# Patient Record
Sex: Female | Born: 1950 | Race: White | Hispanic: No | Smoking: Former smoker
Health system: Southern US, Community
[De-identification: ages and names within clinical notes are randomized; demographics above are authoritative.]

## PROBLEM LIST (undated history)

## (undated) DIAGNOSIS — C50919 Malignant neoplasm of unspecified site of unspecified female breast: Secondary | ICD-10-CM

## (undated) DIAGNOSIS — Z8709 Personal history of other diseases of the respiratory system: Secondary | ICD-10-CM

## (undated) DIAGNOSIS — H409 Unspecified glaucoma: Secondary | ICD-10-CM

## (undated) DIAGNOSIS — M810 Age-related osteoporosis without current pathological fracture: Secondary | ICD-10-CM

## (undated) DIAGNOSIS — M62838 Other muscle spasm: Secondary | ICD-10-CM

## (undated) DIAGNOSIS — F329 Major depressive disorder, single episode, unspecified: Secondary | ICD-10-CM

## (undated) DIAGNOSIS — E559 Vitamin D deficiency, unspecified: Secondary | ICD-10-CM

## (undated) DIAGNOSIS — Z8669 Personal history of other diseases of the nervous system and sense organs: Secondary | ICD-10-CM

## (undated) DIAGNOSIS — E232 Diabetes insipidus: Secondary | ICD-10-CM

## (undated) DIAGNOSIS — K649 Unspecified hemorrhoids: Secondary | ICD-10-CM

## (undated) DIAGNOSIS — Z8041 Family history of malignant neoplasm of ovary: Secondary | ICD-10-CM

## (undated) DIAGNOSIS — H269 Unspecified cataract: Secondary | ICD-10-CM

## (undated) DIAGNOSIS — I10 Essential (primary) hypertension: Secondary | ICD-10-CM

## (undated) DIAGNOSIS — Z923 Personal history of irradiation: Secondary | ICD-10-CM

## (undated) DIAGNOSIS — G4730 Sleep apnea, unspecified: Secondary | ICD-10-CM

## (undated) DIAGNOSIS — R197 Diarrhea, unspecified: Secondary | ICD-10-CM

## (undated) DIAGNOSIS — E785 Hyperlipidemia, unspecified: Secondary | ICD-10-CM

## (undated) DIAGNOSIS — F419 Anxiety disorder, unspecified: Secondary | ICD-10-CM

## (undated) DIAGNOSIS — R42 Dizziness and giddiness: Secondary | ICD-10-CM

## (undated) DIAGNOSIS — M719 Bursopathy, unspecified: Secondary | ICD-10-CM

## (undated) DIAGNOSIS — Z86010 Personal history of colonic polyps: Secondary | ICD-10-CM

## (undated) HISTORY — DX: Hyperlipidemia, unspecified: E78.5

## (undated) HISTORY — PX: BREAST BIOPSY: SHX20

## (undated) HISTORY — DX: Anxiety disorder, unspecified: F41.9

## (undated) HISTORY — PX: OTHER SURGICAL HISTORY: SHX169

## (undated) HISTORY — DX: Sleep apnea, unspecified: G47.30

## (undated) HISTORY — DX: Unspecified cataract: H26.9

## (undated) HISTORY — PX: CATARACT EXTRACTION: SUR2

## (undated) HISTORY — DX: Personal history of irradiation: Z92.3

## (undated) HISTORY — DX: Essential (primary) hypertension: I10

## (undated) HISTORY — DX: Family history of malignant neoplasm of ovary: Z80.41

## (undated) HISTORY — DX: Major depressive disorder, single episode, unspecified: F32.9

## (undated) HISTORY — DX: Unspecified glaucoma: H40.9

## (undated) HISTORY — DX: Personal history of colonic polyps: Z86.010

## (undated) HISTORY — DX: Age-related osteoporosis without current pathological fracture: M81.0

## (undated) HISTORY — PX: COLONOSCOPY: SHX174

## (undated) HISTORY — DX: Malignant neoplasm of unspecified site of unspecified female breast: C50.919

---

## 1997-04-21 ENCOUNTER — Other Ambulatory Visit: Admission: RE | Admit: 1997-04-21 | Discharge: 1997-04-21 | Payer: Self-pay | Admitting: Obstetrics & Gynecology

## 1998-02-28 ENCOUNTER — Other Ambulatory Visit: Admission: RE | Admit: 1998-02-28 | Discharge: 1998-02-28 | Payer: Self-pay | Admitting: Obstetrics & Gynecology

## 1999-03-13 ENCOUNTER — Other Ambulatory Visit: Admission: RE | Admit: 1999-03-13 | Discharge: 1999-03-13 | Payer: Self-pay | Admitting: Obstetrics & Gynecology

## 2000-05-07 ENCOUNTER — Other Ambulatory Visit: Admission: RE | Admit: 2000-05-07 | Discharge: 2000-05-07 | Payer: Self-pay | Admitting: Obstetrics & Gynecology

## 2001-05-28 ENCOUNTER — Other Ambulatory Visit: Admission: RE | Admit: 2001-05-28 | Discharge: 2001-05-28 | Payer: Self-pay | Admitting: Obstetrics & Gynecology

## 2002-06-09 ENCOUNTER — Other Ambulatory Visit: Admission: RE | Admit: 2002-06-09 | Discharge: 2002-06-09 | Payer: Self-pay | Admitting: Obstetrics & Gynecology

## 2003-06-11 ENCOUNTER — Other Ambulatory Visit: Admission: RE | Admit: 2003-06-11 | Discharge: 2003-06-11 | Payer: Self-pay | Admitting: Obstetrics & Gynecology

## 2004-01-17 ENCOUNTER — Ambulatory Visit: Payer: Self-pay | Admitting: Internal Medicine

## 2004-03-28 ENCOUNTER — Ambulatory Visit: Payer: Self-pay | Admitting: Internal Medicine

## 2004-04-06 ENCOUNTER — Ambulatory Visit: Payer: Self-pay | Admitting: Internal Medicine

## 2004-06-21 ENCOUNTER — Other Ambulatory Visit: Admission: RE | Admit: 2004-06-21 | Discharge: 2004-06-21 | Payer: Self-pay | Admitting: Obstetrics & Gynecology

## 2004-06-28 ENCOUNTER — Ambulatory Visit: Payer: Self-pay | Admitting: Internal Medicine

## 2004-12-27 ENCOUNTER — Ambulatory Visit: Payer: Self-pay | Admitting: Internal Medicine

## 2005-01-11 ENCOUNTER — Ambulatory Visit: Payer: Self-pay | Admitting: Internal Medicine

## 2005-04-23 ENCOUNTER — Ambulatory Visit: Payer: Self-pay | Admitting: Internal Medicine

## 2005-10-22 ENCOUNTER — Ambulatory Visit: Payer: Self-pay | Admitting: Internal Medicine

## 2006-03-28 ENCOUNTER — Ambulatory Visit: Payer: Self-pay | Admitting: Internal Medicine

## 2006-04-15 ENCOUNTER — Ambulatory Visit: Payer: Self-pay | Admitting: Internal Medicine

## 2006-04-15 LAB — CONVERTED CEMR LAB
ALT: 27 units/L (ref 0–40)
Albumin: 4 g/dL (ref 3.5–5.2)
Alkaline Phosphatase: 73 units/L (ref 39–117)
BUN: 6 mg/dL (ref 6–23)
Basophils Relative: 1.7 % — ABNORMAL HIGH (ref 0.0–1.0)
CO2: 35 meq/L — ABNORMAL HIGH (ref 19–32)
Calcium: 9.9 mg/dL (ref 8.4–10.5)
Creatinine, Ser: 0.5 mg/dL (ref 0.4–1.2)
GFR calc Af Amer: 165 mL/min
HDL: 37.7 mg/dL — ABNORMAL LOW (ref >39.0)
LDL Cholesterol: 117 mg/dL — ABNORMAL HIGH (ref 0–99)
Monocytes Relative: 8.6 % (ref 3.0–11.0)
Platelets: 263 10*3/uL (ref 150–400)
RDW: 12.8 % (ref 11.5–14.6)
Total CHOL/HDL Ratio: 5
Total Protein: 7.2 g/dL (ref 6.0–8.3)
Triglycerides: 175 mg/dL — ABNORMAL HIGH (ref 0–149)
VLDL: 35 mg/dL (ref 0–40)

## 2006-09-06 ENCOUNTER — Encounter: Payer: Self-pay | Admitting: Internal Medicine

## 2006-09-06 DIAGNOSIS — I10 Essential (primary) hypertension: Secondary | ICD-10-CM | POA: Insufficient documentation

## 2006-09-06 DIAGNOSIS — E785 Hyperlipidemia, unspecified: Secondary | ICD-10-CM

## 2006-09-06 HISTORY — DX: Hyperlipidemia, unspecified: E78.5

## 2006-09-06 HISTORY — DX: Essential (primary) hypertension: I10

## 2006-10-14 ENCOUNTER — Ambulatory Visit: Payer: Self-pay | Admitting: Internal Medicine

## 2006-10-14 LAB — CONVERTED CEMR LAB
CO2: 32 meq/L (ref 19–32)
Calcium: 9.6 mg/dL (ref 8.4–10.5)
Creatinine, Ser: 0.6 mg/dL (ref 0.4–1.2)
GFR calc Af Amer: 133 mL/min
Glucose, Bld: 86 mg/dL (ref 70–99)

## 2006-10-17 ENCOUNTER — Telehealth: Payer: Self-pay | Admitting: Internal Medicine

## 2007-04-14 ENCOUNTER — Ambulatory Visit: Payer: Self-pay | Admitting: Internal Medicine

## 2007-04-14 LAB — CONVERTED CEMR LAB
AST: 25 units/L (ref 0–37)
Albumin: 4.1 g/dL (ref 3.5–5.2)
Alkaline Phosphatase: 73 units/L (ref 39–117)
BUN: 5 mg/dL — ABNORMAL LOW (ref 6–23)
Bilirubin, Direct: 0.1 mg/dL (ref 0.0–0.3)
Blood in Urine, dipstick: NEGATIVE
Chloride: 99 meq/L (ref 96–112)
Eosinophils Relative: 1.8 % (ref 0.0–5.0)
Glucose, Bld: 96 mg/dL (ref 70–99)
HDL: 33.3 mg/dL — ABNORMAL LOW (ref >39.0)
Ketones, urine, test strip: NEGATIVE
Lymphocytes Relative: 25.4 % (ref 12.0–46.0)
Monocytes Relative: 11.9 % (ref 3.0–12.0)
Neutrophils Relative %: 59.5 % (ref 43.0–77.0)
Nitrite: POSITIVE
Platelets: 278 10*3/uL (ref 150–400)
Potassium: 3.9 meq/L (ref 3.5–5.1)
Protein, U semiquant: NEGATIVE
RDW: 12.4 % (ref 11.5–14.6)
Sodium: 138 meq/L (ref 135–145)
Total CHOL/HDL Ratio: 6.7
Total Protein: 6.8 g/dL (ref 6.0–8.3)
Triglycerides: 187 mg/dL — ABNORMAL HIGH (ref 0–149)
Urobilinogen, UA: 0.2
VLDL: 37 mg/dL (ref 0–40)
WBC: 4.2 10*3/uL — ABNORMAL LOW (ref 4.5–10.5)
pH: 7

## 2007-04-21 ENCOUNTER — Ambulatory Visit: Payer: Self-pay | Admitting: Internal Medicine

## 2007-05-02 ENCOUNTER — Emergency Department (HOSPITAL_COMMUNITY): Admission: EM | Admit: 2007-05-02 | Discharge: 2007-05-02 | Payer: Self-pay | Admitting: Emergency Medicine

## 2007-05-07 ENCOUNTER — Telehealth: Payer: Self-pay | Admitting: Internal Medicine

## 2007-05-12 ENCOUNTER — Ambulatory Visit: Payer: Self-pay | Admitting: Gastroenterology

## 2007-05-21 ENCOUNTER — Telehealth: Payer: Self-pay | Admitting: Gastroenterology

## 2007-05-23 ENCOUNTER — Ambulatory Visit: Payer: Self-pay

## 2007-05-26 ENCOUNTER — Encounter: Payer: Self-pay | Admitting: Gastroenterology

## 2007-05-26 ENCOUNTER — Ambulatory Visit: Payer: Self-pay | Admitting: Gastroenterology

## 2007-05-26 ENCOUNTER — Encounter: Payer: Self-pay | Admitting: Internal Medicine

## 2007-05-27 ENCOUNTER — Encounter: Payer: Self-pay | Admitting: Gastroenterology

## 2007-09-23 ENCOUNTER — Ambulatory Visit: Payer: Self-pay | Admitting: Internal Medicine

## 2007-09-23 DIAGNOSIS — F329 Major depressive disorder, single episode, unspecified: Secondary | ICD-10-CM

## 2007-09-23 DIAGNOSIS — F3289 Other specified depressive episodes: Secondary | ICD-10-CM

## 2007-09-23 HISTORY — DX: Major depressive disorder, single episode, unspecified: F32.9

## 2007-09-23 HISTORY — DX: Other specified depressive episodes: F32.89

## 2008-03-26 ENCOUNTER — Ambulatory Visit: Payer: Self-pay | Admitting: Internal Medicine

## 2008-03-26 DIAGNOSIS — J069 Acute upper respiratory infection, unspecified: Secondary | ICD-10-CM | POA: Insufficient documentation

## 2008-07-15 ENCOUNTER — Encounter: Payer: Self-pay | Admitting: Internal Medicine

## 2008-09-16 ENCOUNTER — Ambulatory Visit: Payer: Self-pay | Admitting: Internal Medicine

## 2008-09-16 LAB — CONVERTED CEMR LAB
ALT: 24 U/L
AST: 30 U/L
Albumin: 4.6 g/dL
Alkaline Phosphatase: 76 U/L
BUN: 6 mg/dL
Basophils Absolute: 0 K/uL
Basophils Relative: 1 %
Bilirubin Urine: NEGATIVE
Bilirubin, Direct: 0 mg/dL
Blood in Urine, dipstick: NEGATIVE
CO2: 32 meq/L
Calcium: 9.4 mg/dL
Chloride: 92 meq/L — ABNORMAL LOW
Cholesterol: 187 mg/dL
Creatinine, Ser: 0.6 mg/dL
Eosinophils Absolute: 0.1 K/uL
Eosinophils Relative: 1.6 %
GFR calc non Af Amer: 109.2 mL/min
Glucose, Bld: 86 mg/dL
Glucose, Urine, Semiquant: NEGATIVE
HCT: 38.1 %
HDL: 37.3 mg/dL — ABNORMAL LOW
Hemoglobin: 13.5 g/dL
LDL Cholesterol: 129 mg/dL — ABNORMAL HIGH
Lymphocytes Relative: 24.5 %
Lymphs Abs: 1.1 K/uL
MCHC: 35.5 g/dL
MCV: 90.3 fL
Monocytes Absolute: 0.5 K/uL
Monocytes Relative: 10 %
Neutro Abs: 2.8 K/uL
Neutrophils Relative %: 62.9 %
Platelets: 246 K/uL
Potassium: 3 meq/L — ABNORMAL LOW
Protein, U semiquant: NEGATIVE
RBC: 4.22 M/uL
RDW: 12.4 %
Sodium: 134 meq/L — ABNORMAL LOW
TSH: 0.76 u[IU]/mL
Total Bilirubin: 1.1 mg/dL
Total CHOL/HDL Ratio: 5
Total Protein: 7.7 g/dL
Triglycerides: 103 mg/dL
Urobilinogen, UA: 0.2
VLDL: 20.6 mg/dL
WBC Urine, dipstick: NEGATIVE
WBC: 4.5 10*3/microliter
pH: 7

## 2008-09-24 ENCOUNTER — Ambulatory Visit: Payer: Self-pay | Admitting: Internal Medicine

## 2008-09-24 DIAGNOSIS — Z860100 Personal history of colon polyps, unspecified: Secondary | ICD-10-CM

## 2008-09-24 DIAGNOSIS — M810 Age-related osteoporosis without current pathological fracture: Secondary | ICD-10-CM

## 2008-09-24 HISTORY — DX: Age-related osteoporosis without current pathological fracture: M81.0

## 2008-09-24 HISTORY — DX: Personal history of colon polyps, unspecified: Z86.0100

## 2008-09-24 HISTORY — DX: Personal history of colonic polyps: Z86.010

## 2009-03-28 ENCOUNTER — Ambulatory Visit: Payer: Self-pay | Admitting: Internal Medicine

## 2009-03-28 LAB — CONVERTED CEMR LAB
BUN: 5 mg/dL — ABNORMAL LOW (ref 6–23)
CO2: 33 meq/L — ABNORMAL HIGH (ref 19–32)
Chloride: 104 meq/L (ref 96–112)
Potassium: 4.6 meq/L (ref 3.5–5.1)

## 2009-03-29 ENCOUNTER — Telehealth: Payer: Self-pay | Admitting: Gastroenterology

## 2009-04-01 ENCOUNTER — Ambulatory Visit: Payer: Self-pay | Admitting: Gastroenterology

## 2009-04-01 LAB — CONVERTED CEMR LAB
Albumin: 4.5 g/dL (ref 3.5–5.2)
BUN: 4 mg/dL — ABNORMAL LOW (ref 6–23)
Basophils Absolute: 0 10*3/uL (ref 0.0–0.1)
CO2: 31 meq/L (ref 19–32)
GFR calc non Af Amer: 134.52 mL/min (ref >60)
Glucose, Bld: 92 mg/dL (ref 70–99)
Hemoglobin: 13.3 g/dL (ref 12.0–15.0)
IgA: 230 mg/dL (ref 68–378)
Lymphocytes Relative: 27.2 % (ref 12.0–46.0)
Monocytes Relative: 10.3 % (ref 3.0–12.0)
Platelets: 256 10*3/uL (ref 150.0–400.0)
RDW: 12.5 % (ref 11.5–14.6)
Sodium: 134 meq/L — ABNORMAL LOW (ref 135–145)
TSH: 0.78 microintl units/mL (ref 0.35–5.50)
Tissue Transglutaminase Ab, IgA: 0.5 units (ref <7)
Total Bilirubin: 0.6 mg/dL (ref 0.3–1.2)
Total Protein: 7.3 g/dL (ref 6.0–8.3)
WBC: 3.6 10*3/uL — ABNORMAL LOW (ref 4.5–10.5)

## 2009-04-05 ENCOUNTER — Encounter: Payer: Self-pay | Admitting: Gastroenterology

## 2009-04-08 LAB — CONVERTED CEMR LAB
Collection Interval-CRCL: 24 hr
Creatinine, Urine: 40.6 mg/dL

## 2009-05-02 ENCOUNTER — Telehealth: Payer: Self-pay | Admitting: Gastroenterology

## 2009-05-10 ENCOUNTER — Ambulatory Visit: Payer: Self-pay | Admitting: Gastroenterology

## 2009-09-26 ENCOUNTER — Ambulatory Visit: Payer: Self-pay | Admitting: Internal Medicine

## 2010-02-07 NOTE — Assessment & Plan Note (Signed)
History of Present Illness Visit Type: Initial Visit Primary GI MD: Rob Bunting MD Primary Provider: Eleonore Chiquito, MD Chief Complaint: diarrhea History of Present Illness:     very pleasant 60 year old female whom I last saw the time of routine, screening colonoscopy about 2 years ago. I removed a single small tubular adenoma and she was put in our colonoscopy recall system for repeat colonoscopy at five-year interval.  she has had recurring, intermittent loose stools.  She will have extreme diarrhea (can't leave the house), this used to happen every 2-3 months.  Felt it may be food related.  Occuring more often.  Three weeks in a row.  This will be 24-36 hours of diarrhea, her stomach churned the day prior.  has been going on more many years.  No flushing, but she does feel a sort of cold sweat diarrhea.  She avoids dairy because it makes her nauseas.  Rarely eats some ice cream, cheese, does not correlate with symptloms.  Surgarless gums, she avoids, can cause diarrhea.    between diarrheal episodes she has 3-4 soft, easy to move bowel movements a week. She has never felt constipated.  Overall weight is down about 10 pounds, intentionally.  No rashes, lumps on skin, shins.               Current Medications (verified): 1)  Chlorpropamide 250 Mg Tabs (Chlorpropamide) .Marland Kitchen.. 1 Once Daily 2)  Lexapro 10 Mg Tabs (Escitalopram Oxalate) .Marland Kitchen.. 1 Once Daily 3)  Lipitor 80 Mg Tabs (Atorvastatin Calcium) .Marland Kitchen.. 1 Once Daily 4)  Lisinopril-Hydrochlorothiazide 20-25 Mg Tabs (Lisinopril-Hydrochlorothiazide) .Marland Kitchen.. 1 Once Daily 5)  Potassium Chloride Crys Cr 20 Meq Tbcr (Potassium Chloride Crys Cr) .... 2 Two Times A Day 6)  Xalatan 0.005 %  Soln (Latanoprost) .... Uad 7)  Actonel 150 Mg Tabs (Risedronate Sodium) .... One Monthly 8)  Alendronate Sodium 70 Mg Tabs (Alendronate Sodium) .... One Weekly 9)  Vitamin D (Ergocalciferol) 50000 Unit Caps (Ergocalciferol) .Marland Kitchen.. 1 Q Weekly 10)   Diphenoxylate-Atropine 2.5-0.025 Mg Tabs (Diphenoxylate-Atropine) .... One Every 4 Hours As Needed For Diarrhea  Allergies (verified): No Known Drug Allergies  Past History:  Past Medical History: Hyperlipidemia Hypertension Diabetes insipidus Depression Osteoporosis precancerous colon polyps, colonoscopy 2007-06-19, set for repeat colonoscopy June 18, 2012 Glaucoma ([place]) intermittent diarrhea  Past Surgical History: L breast lumpectomy gravida one, para one, abortus zero Cardiolyte 06-19-07 bone density June 18, 2008  Family History: father died age 61 of an MI, history of hypertension mother died age 15 type 2 diabetes, coronary artery disease, status post prior MI at age 47 One brother died of ALS one brother, status post CABG age 81 Two sisters, one status post MI, that is 90  Social History: recently widowed fall of 06-19-06 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 social drinker, does not smoke cigarettes.  Review of Systems       Pertinent positive and negative review of systems were noted in the above HPI and GI specific review of systems.  All other review of systems was otherwise negative.   Vital Signs:  Patient profile:   60 year old female Height:      61.5 inches Weight:      208.6 pounds BMI:     38.92 Pulse rate:   80 / minute Pulse rhythm:   regular BP sitting:   130 / 76  (left arm) Cuff size:   regular  Vitals Entered By: Harlow Mares CMA Duncan Dull) (April 01, 2009 9:29 AM)  Physical  Exam  Additional Exam:  Constitutional: generally well appearing Psychiatric: alert and oriented times 3 Eyes: extraocular movements intact Mouth: oropharynx moist, no lesions Neck: supple, no lymphadenopathy Cardiovascular: heart regular rate and rythm Lungs: CTA bilaterally Abdomen: soft, non-tender, non-distended, no obvious ascites, no peritoneal signs, normal bowel sounds Extremities: no lower extremity edema bilaterally Skin: no lesions on visible extremities    Impression &  Recommendations:  Problem # 1:  Intermittent diarrhea between these episodes of intermittent diarrhea she really feels fine. She has not constipated. She had a colonoscopy 2 years ago and it was essentially normal except for a small tubular adenoma.  This may be IBS, diarrhea predominance. Perhaps she has microscopic colitis which would not have been checked for on routine screening colonoscopy. Perhaps chronic infection although it seems unlikely given that the diarrheal episodes are discrete and between that she has relatively normal bowels. Perhaps dietary related although she really tries to avoid sugar-free gum, dairy. Carcinoid syndrome can present with intermittent diarrheal episodes, she does not have flushing however. For now we'll send her blood for CBC, complete metabolic profile, sedimentation rate.  At her next diarrheal episode we will send her stool for the studies listed below. We will also collect a urine samples for 5 HIAA levels.  At the following workup is completed I will contact her, consider further workup if needed.  Other Orders: TLB-CMP (Comprehensive Metabolic Pnl) (80053-COMP) TLB-TSH (Thyroid Stimulating Hormone) (84443-TSH) TLB-CBC Platelet - w/Differential (85025-CBCD) TLB-IgA (Immunoglobulin A) (82784-IGA) T-Tissue Transglutamase Ab IgA ([id number]) TLB-Creatinine, Blood (82565-CREA) T-Urine 24 Hr. Creatinine Clearance [phone]) T-Urine 24 Hr. 5 HIAA [phone]) T-Culture, C-Diff Toxin A/B ([id number]) T-Culture, Stool ([id number]) T-Fecal WBC ([id number])  Patient Instructions: 1)  You will get lab test(s) done today (CMET, TSH, CBC, 24 hour urinary 5HIAA levels, 24 hour urinary creatinine, tTG, total IgA level). 2)  Stool samples during your next episode of diarrhrea (c. diff, giardia, stool cultures, fecal lactoferrin). 3)  A copy of this information will be sent to Dr. Amador Cunas. 4)  Can take immodium 1 pill, at first sign of trouble. 5)   The medication list was reviewed and reconciled.  All changed / newly prescribed medications were explained.  A complete medication list was provided to the patient / caregiver.

## 2010-02-07 NOTE — Progress Notes (Signed)
Summary: ? re stools sample   Phone Note Call from Patient Call back at [phone]   Caller: Patient Call For: Christella Hartigan Reason for Call: Talk to Nurse Summary of Call: Patient has questions regarding stool samples before coming to her appt 5-3 Initial call taken by: Tawni Levy,  May 02, 2009 10:59 AM  Follow-up for Phone Call        pt has not had any diarrhea episodes to complete stool studies but she will keep her upcoming 05/10/09 appt Follow-up by: Chales Abrahams CMA (AAMA),  May 02, 2009 11:07 AM

## 2010-02-07 NOTE — Assessment & Plan Note (Signed)
  Review of gastrointestinal problems: 1. Tubular adenoma: Colonoscopy may 2009. Recall colonoscopy at five-year interval 2. intermittent, recurrent diarrhea ( workup started spring, 2011): celiac sprue tests negative, TSH normal, Urinary 5HIAA level normal.     History of Present Illness Visit Type: Follow-up Visit Primary GI MD: Rob Bunting MD Primary Provider: Eleonore Chiquito, MD Chief Complaint: 4-5 week f/u History of Present Illness:     who has not had dramatic diarrhea since last visit.  Still can have intermittent 3-4 loose stools every once in a while.  No rectal bleeding.  lab testing was all essentially negative, see those results above.           Current Medications (verified): 1)  Chlorpropamide 250 Mg Tabs (Chlorpropamide) .Marland Kitchen.. 1 Once Daily 2)  Lexapro 10 Mg Tabs (Escitalopram Oxalate) .Marland Kitchen.. 1 Once Daily 3)  Lipitor 80 Mg Tabs (Atorvastatin Calcium) .Marland Kitchen.. 1 Once Daily 4)  Lisinopril-Hydrochlorothiazide 20-25 Mg Tabs (Lisinopril-Hydrochlorothiazide) .Marland Kitchen.. 1 Once Daily 5)  Potassium Chloride Crys Cr 20 Meq Tbcr (Potassium Chloride Crys Cr) .... 2 Two Times A Day 6)  Xalatan 0.005 %  Soln (Latanoprost) .... Uad 7)  Actonel 150 Mg Tabs (Risedronate Sodium) .... One Monthly 8)  Vitamin D (Ergocalciferol) 50000 Unit Caps (Ergocalciferol) .Marland Kitchen.. 1 Q Weekly  Allergies (verified): No Known Drug Allergies  Vital Signs:  Patient profile:   60 year old female Height:      61.5 inches Weight:      213 pounds BMI:     39.74 Pulse rate:   78 / minute Pulse rhythm:   regular BP sitting:   130 / 68  (left arm)  Vitals Entered By: Chales Abrahams CMA Duncan Dull) (May 10, 2009 2:19 PM)  Physical Exam  Additional Exam:  Constitutional: generally well appearing Psychiatric: alert and oriented times 3 Abdomen: soft, non-tender, non-distended, normal bowel sounds    Impression & Recommendations:  Problem # 1:  intermittent diarrhea she a colonoscopy less than 2 years ago. Her  symptoms are intermittent only. Lab testing for sprue, carcinoid syndrome were all negative. I recommended she take a single Imodium every morning shortly after waking up and to call if things worsen. She still has to bring a sample of stool she has dramatic watery diarrhea again.  Patient Instructions: 1)  Take one immodium every morning after waking up.  Only stop if you become constipated. 2)  Call Dr. Christella Hartigan office in 2 months to update on symptoms. 3)  The medication list was reviewed and reconciled.  All changed / newly prescribed medications were explained.  A complete medication list was provided to the patient / caregiver.

## 2010-02-07 NOTE — Assessment & Plan Note (Signed)
Summary: 6 MONTH FUP//CCM   Vital Signs:  Patient profile:   60 year old female Weight:      215 pounds Temp:     98.2 degrees F oral BP sitting:   122 / 78  (right arm) Cuff size:   regular  Vitals Entered By: Duard Brady LPN (September 26, 2009 8:16 AM) CC: 6 MOS ROV  doing well Is Patient Diabetic? No Flu Vaccine Consent Questions     Do you have a history of severe allergic reactions to this vaccine? no    Any prior history of allergic reactions to egg and/or gelatin? no    Do you have a sensitivity to the preservative Thimersol? no    Do you have a past history of Guillan-Barre Syndrome? no    Do you currently have an acute febrile illness? no    Have you ever had a severe reaction to latex? no    Vaccine information given and explained to patient? yes    Are you currently pregnant? no    Lot Number:[id number]   Exp Date:07/08/2010   Site Given  Left Deltoid IM    Primary Care Provider:  Eleonore Chiquito, MD  CC:  6 MOS ROV  doing well.  History of Present Illness: 60 year old patient who is seen today for follow-up.  she has treated hypertension, and dyslipidemia.  Laboratory studies were checked last brain.  She has done quite well.  She has had a recent gynecologic evaluation.  She also has a history of diabetes insipidus, which has been stable.  No concerns or complaints today as a 5-pound weight gain.  She has a history also of depression, which has been quite stable on Lexapro  Allergies (verified): No Known Drug Allergies  Past History:  Past Medical History: Reviewed history from 04/01/2009 and no changes required. Hyperlipidemia Hypertension Diabetes insipidus Depression Osteoporosis precancerous colon polyps, colonoscopy 2009, set for repeat colonoscopy 2014 Glaucoma ([place]) intermittent diarrhea  Past Surgical History: Reviewed history from 04/01/2009 and no changes required. L breast lumpectomy gravida one, para one, abortus  zero Cardiolyte 2009 bone density 2010  Review of Systems       The patient complains of weight gain.  The patient denies anorexia, fever, weight loss, vision loss, decreased hearing, hoarseness, chest pain, syncope, dyspnea on exertion, peripheral edema, prolonged cough, headaches, hemoptysis, abdominal pain, melena, hematochezia, severe indigestion/heartburn, hematuria, incontinence, genital sores, muscle weakness, suspicious skin lesions, transient blindness, difficulty walking, depression, unusual weight change, abnormal bleeding, enlarged lymph nodes, angioedema, and breast masses.    Physical Exam  General:  overweight-appearing.  120/82overweight-appearing.   Head:  Normocephalic and atraumatic without obvious abnormalities. No apparent alopecia or balding. Eyes:  No corneal or conjunctival inflammation noted. EOMI. Perrla. Funduscopic exam benign, without hemorrhages, exudates or papilledema. Vision grossly normal. Mouth:  Oral mucosa and oropharynx without lesions or exudates.  Teeth in good repair. Neck:  No deformities, masses, or tenderness noted. Lungs:  Normal respiratory effort, chest expands symmetrically. Lungs are clear to auscultation, no crackles or wheezes. Heart:  Normal rate and regular rhythm. S1 and S2 normal without gallop, murmur, click, rub or other extra sounds. Abdomen:  Bowel sounds positive,abdomen soft and non-tender without masses, organomegaly or hernias noted. Msk:  No deformity or scoliosis noted of thoracic or lumbar spine.   Pulses:  R and L carotid,radial,femoral,dorsalis pedis and posterior tibial pulses are full and equal bilaterally Extremities:  No clubbing, cyanosis, edema, or deformity noted with normal full range of  motion of all joints.   Skin:  Intact without suspicious lesions or rashes Cervical Nodes:  No lymphadenopathy noted Psych:  Cognition and judgment appear intact. Alert and cooperative with normal attention span and concentration. No  apparent delusions, illusions, hallucinations   Impression & Recommendations:  Problem # 1:  DEPRESSION (ICD-311)  Her updated medication list for this problem includes:    Lexapro 10 Mg Tabs (Escitalopram oxalate) .Marland Kitchen... 1 once daily  Her updated medication list for this problem includes:    Lexapro 10 Mg Tabs (Escitalopram oxalate) .Marland Kitchen... 1 once daily  Problem # 2:  HYPERTENSION (ICD-401.9)  Her updated medication list for this problem includes:    Lisinopril-hydrochlorothiazide 20-25 Mg Tabs (Lisinopril-hydrochlorothiazide) .Marland Kitchen... 1 once daily  Her updated medication list for this problem includes:    Lisinopril-hydrochlorothiazide 20-25 Mg Tabs (Lisinopril-hydrochlorothiazide) .Marland Kitchen... 1 once daily  Problem # 3:  HYPERLIPIDEMIA (ICD-272.4)  Her updated medication list for this problem includes:    Lipitor 80 Mg Tabs (Atorvastatin calcium) .Marland Kitchen... 1 once daily  Her updated medication list for this problem includes:    Lipitor 80 Mg Tabs (Atorvastatin calcium) .Marland Kitchen... 1 once daily  Complete Medication List: 1)  Chlorpropamide 250 Mg Tabs (Chlorpropamide) .Marland Kitchen.. 1 once daily 2)  Lexapro 10 Mg Tabs (Escitalopram oxalate) .Marland Kitchen.. 1 once daily 3)  Lipitor 80 Mg Tabs (Atorvastatin calcium) .Marland Kitchen.. 1 once daily 4)  Lisinopril-hydrochlorothiazide 20-25 Mg Tabs (Lisinopril-hydrochlorothiazide) .Marland Kitchen.. 1 once daily 5)  Potassium Chloride Crys Cr 20 Meq Tbcr (Potassium chloride crys cr) .... 2 two times a day 6)  Actonel 150 Mg Tabs (Risedronate sodium) .... One monthly 7)  Vitamin D (ergocalciferol) 50000 Unit Caps (Ergocalciferol) .Marland Kitchen.. 1 q weekly 8)  Lumigan 0.01 % Soln (Bimatoprost) .... Uad  Other Orders: Admin 1st Vaccine (45409) Flu Vaccine 66yrs + (81191)  Patient Instructions: 1)  Please schedule a follow-up appointment in 6 months. 2)  Limit your Sodium (Salt) to less than 2 grams a day(slightly less than 1/2 a teaspoon) to prevent fluid retention, swelling, or worsening of symptoms. 3)  It  is important that you exercise regularly at least 20 minutes 5 times a week. If you develop chest pain, have severe difficulty breathing, or feel very tired , stop exercising immediately and seek medical attention. 4)  You need to lose weight. Consider a lower calorie diet and regular exercise.  5)  Take calcium +Vitamin D daily. Prescriptions: ACTONEL 150 MG TABS (RISEDRONATE SODIUM) one monthly  #3 x 6   Entered and Authorized by:   Gordy Savers  MD   Signed by:   Gordy Savers  MD on 09/26/2009   Method used:   Print then Give to Patient   RxID:   [id number] POTASSIUM CHLORIDE CRYS CR 20 MEQ TBCR (POTASSIUM CHLORIDE CRYS CR) 2 two times a day  #360 x 6   Entered and Authorized by:   Gordy Savers  MD   Signed by:   Gordy Savers  MD on 09/26/2009   Method used:   Print then Give to Patient   RxID:   [id number] LISINOPRIL-HYDROCHLOROTHIAZIDE 20-25 MG TABS (LISINOPRIL-HYDROCHLOROTHIAZIDE) 1 once daily  #90 x 6   Entered and Authorized by:   Gordy Savers  MD   Signed by:   Gordy Savers  MD on 09/26/2009   Method used:   Print then Give to Patient   RxID:   [id number] LIPITOR 80 MG TABS (ATORVASTATIN CALCIUM) 1 once daily  #90 x  6   Entered and Authorized by:   Gordy Savers  MD   Signed by:   Gordy Savers  MD on 09/26/2009   Method used:   Print then Give to Patient   RxID:   [id number] LEXAPRO 10 MG TABS (ESCITALOPRAM OXALATE) 1 once daily  #90 Tablet x 6   Entered and Authorized by:   Gordy Savers  MD   Signed by:   Gordy Savers  MD on 09/26/2009   Method used:   Print then Give to Patient   RxID:   [id number] CHLORPROPAMIDE 250 MG TABS (CHLORPROPAMIDE) 1 once daily  #90 Tablet x 6   Entered and Authorized by:   Gordy Savers  MD   Signed by:   Gordy Savers  MD on 09/26/2009   Method used:   Print then Give to Patient   RxID:   [id number]

## 2010-02-07 NOTE — Progress Notes (Signed)
Summary: Diarrhea   Phone Note From Other Clinic Call back at Home Phone [phone]   Caller: Aurther Loft @ Dr Lesia Hausen Call For: Dr Christella Hartigan Reason for Call: Schedule Patient Appt Summary of Call: Diarrhea loose watery stools- can have 20 episodes in one day. Would like to seen sooner than next available on 05-03-09. Had a procedure with Dr Christella Hartigan but no office visit. Call back to patient directly. Initial call taken by: Leanor Kail [facility],  March 29, 2009 10:17 AM  Follow-up for Phone Call        Should i work this pt. in with Pa. as your first new pt. spot is mid April?You are supervising dr. on 04/05/2009. Follow-up by: Teryl Lucy RN,  March 29, 2009 11:03 AM  Additional Follow-up for Phone Call Additional follow up Details #1::        i have an opening this friday (not sure if it is rov or NGI spot, doesn't matter to me, she can have the spot).  If she feels this isn't soon enough then PA visit in next day or two.  Additional Follow-up by: Rachael Fee MD,  March 29, 2009 11:21 AM    Additional Follow-up for Phone Call Additional follow up Details #2::    given NP3 appt. for this Friday ar 9:30 am.message left for pt. to call me back to confirm she got message. Follow-up by: Teryl Lucy RN,  March 29, 2009 12:01 PM  Additional Follow-up for Phone Call Additional follow up Details #3:: Details for Additional Follow-up Action Taken: Pt. will come for appt. this Friday. Additional Follow-up by: Teryl Lucy RN,  March 29, 2009 1:55 PM

## 2010-02-07 NOTE — Assessment & Plan Note (Signed)
Summary: 6 mo rov/mm/pt rescd//ccm   Vital Signs:  Patient profile:   60 year old female Weight:      210 pounds Temp:     97.0 degrees F oral BP sitting:   140 / 80  (right arm) Cuff size:   regular  Vitals Entered By: Duard Brady LPN (March 28, 2009 8:37 AM) CC: 6 mos rov - doing ok , c/o gi issues - diarrhea wkly , dry skin and nose bleeds Is Patient Diabetic? No   CC:  6 mos rov - doing ok , c/o gi issues - diarrhea wkly , and dry skin and nose bleeds.  History of Present Illness: 60 year old patient who is seen today for follow-up.  She has a history of hypertension, dyslipidemia, and diabetes insipidus.  Her main complaint is diarrhea.  She has had intermittent problems for about 5 years, but usually has diarrhea one or two days per month.  For the past 3 weeks.  She has had two severe days per week of diarrhea.  She describes this as loose, watery, occurring as many as 20 times per day.  There's been no abdominal pain or weight loss. Does not seem  to be related to lactose products; she ingests ice cream twice per week.  Attempts at a high fiber diet.  Has resulted in increased gaseousness and abdominal discomfort.  She has a history of depression, which has been stable  Preventive Screening-Counseling & Management  Alcohol-Tobacco     Smoking Status: never  Allergies (verified): No Known Drug Allergies  Past History:  Past Medical History: Hyperlipidemia Hypertension Diabetes insipidus Depression Osteoporosis Colonic polyps, hx of Glaucoma ([place]) diarrhea  Social History: Smoking Status:  never  Review of Systems       The patient complains of weight gain.  The patient denies anorexia, fever, weight loss, vision loss, decreased hearing, hoarseness, chest pain, syncope, dyspnea on exertion, peripheral edema, prolonged cough, headaches, hemoptysis, abdominal pain, melena, hematochezia, severe indigestion/heartburn, hematuria, incontinence, genital sores,  muscle weakness, suspicious skin lesions, transient blindness, difficulty walking, depression, unusual weight change, abnormal bleeding, enlarged lymph nodes, angioedema, and breast masses.    Physical Exam  General:  overweight-appearing.  normal blood pressureoverweight-appearing.   Head:  Normocephalic and atraumatic without obvious abnormalities. No apparent alopecia or balding. Eyes:  No corneal or conjunctival inflammation noted. EOMI. Perrla. Funduscopic exam benign, without hemorrhages, exudates or papilledema. Vision grossly normal. Mouth:  Oral mucosa and oropharynx without lesions or exudates.  Teeth in good repair. Neck:  No deformities, masses, or tenderness noted. Lungs:  Normal respiratory effort, chest expands symmetrically. Lungs are clear to auscultation, no crackles or wheezes. Heart:  Normal rate and regular rhythm. S1 and S2 normal without gallop, murmur, click, rub or other extra sounds. Abdomen:  Bowel sounds positive,abdomen soft and non-tender without masses, organomegaly or hernias noted. Msk:  No deformity or scoliosis noted of thoracic or lumbar spine.   Pulses:  R and L carotid,radial,femoral,dorsalis pedis and posterior tibial pulses are full and equal bilaterally Extremities:  No clubbing, cyanosis, edema, or deformity noted with normal full range of motion of all joints.     Impression & Recommendations:  Problem # 1:  DIARRHEA (ICD-787.91) Assessment Unchanged  Her updated medication list for this problem includes:    Diphenoxylate-atropine 2.5-0.025 Mg Tabs (Diphenoxylate-atropine) ..... 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  Will set up for GI consultation    Her updated medication list for this  problem includes:    Diphenoxylate-atropine 2.5-0.025 Mg Tabs (Diphenoxylate-atropine) ..... One every 4 hours as needed for diarrhea  Problem # 2:  DEPRESSION (ICD-311)  Her updated medication list for this problem  includes:    Lexapro 10 Mg Tabs (Escitalopram oxalate) .Marland Kitchen... 1 once daily  Her updated medication list for this problem includes:    Lexapro 10 Mg Tabs (Escitalopram oxalate) .Marland Kitchen... 1 once daily  Problem # 3:  HYPERTENSION (ICD-401.9)  Her updated medication list for this problem includes:    Lisinopril-hydrochlorothiazide 20-25 Mg Tabs (Lisinopril-hydrochlorothiazide) .Marland Kitchen... 1 once daily    Her updated medication list for this problem includes:    Lisinopril-hydrochlorothiazide 20-25 Mg Tabs (Lisinopril-hydrochlorothiazide) .Marland Kitchen... 1 once daily  Problem # 4:  HYPERLIPIDEMIA (ICD-272.4)  Her updated medication list for this problem includes:    Lipitor 80 Mg Tabs (Atorvastatin calcium) .Marland Kitchen... 1 once daily  Her updated medication list for this problem includes:    Lipitor 80 Mg Tabs (Atorvastatin calcium) .Marland Kitchen... 1 once daily  Complete Medication List: 1)  Chlorpropamide 250 Mg Tabs (Chlorpropamide) .Marland Kitchen.. 1 once daily 2)  Lexapro 10 Mg Tabs (Escitalopram oxalate) .Marland Kitchen.. 1 once daily 3)  Lipitor 80 Mg Tabs (Atorvastatin calcium) .Marland Kitchen.. 1 once daily 4)  Lisinopril-hydrochlorothiazide 20-25 Mg Tabs (Lisinopril-hydrochlorothiazide) .Marland Kitchen.. 1 once daily 5)  Potassium Chloride Crys Cr 20 Meq Tbcr (Potassium chloride crys cr) .... 2 two times a day 6)  Xalatan 0.005 % Soln (Latanoprost) .... Uad 7)  Actonel 150 Mg Tabs (Risedronate sodium) .... One monthly 8)  Alendronate Sodium 70 Mg Tabs (Alendronate sodium) .... One weekly 9)  Vitamin D (ergocalciferol) 50000 Unit Caps (Ergocalciferol) .Marland Kitchen.. 1 q weekly 10)  Diphenoxylate-atropine 2.5-0.025 Mg Tabs (Diphenoxylate-atropine) .... One every 4 hours as needed for diarrhea  Other Orders: Gastroenterology Referral (GI) Venipuncture (60454) TLB-BMP (Basic Metabolic Panel-BMET) (80048-METABOL)  Patient Instructions: 1)  Please schedule a follow-up appointment in 6 months. 2)  Limit your Sodium (Salt). 3)  It is important that you exercise regularly at  least 20 minutes 5 times a week. If you develop chest pain, have severe difficulty breathing, or feel very tired , stop exercising immediately and seek medical attention. 4)  You need to lose weight. Consider a lower calorie diet and regular exercise.  5)  Check your Blood Pressure regularly. If it is above: 150/90  you should make an appointment. 6)  GI consultation as scheduled Prescriptions: DIPHENOXYLATE-ATROPINE 2.5-0.025 MG TABS (DIPHENOXYLATE-ATROPINE) one every 4 hours as needed for diarrhea  #50 x 2   Entered and Authorized by:   Gordy Savers  MD   Signed by:   Gordy Savers  MD on 03/28/2009   Method used:   Print then Give to Patient   RxID:   [id number] POTASSIUM CHLORIDE CRYS CR 20 MEQ TBCR (POTASSIUM CHLORIDE CRYS CR) 2 two times a day  #360 x 6   Entered and Authorized by:   Gordy Savers  MD   Signed by:   Gordy Savers  MD on 03/28/2009   Method used:   Print then Give to Patient   RxID:   [id number] LISINOPRIL-HYDROCHLOROTHIAZIDE 20-25 MG TABS (LISINOPRIL-HYDROCHLOROTHIAZIDE) 1 once daily  #90 x 3   Entered and Authorized by:   Gordy Savers  MD   Signed by:   Gordy Savers  MD on 03/28/2009   Method used:   Print then Give to Patient   RxID:   [id number] LIPITOR 80 MG TABS (ATORVASTATIN CALCIUM) 1  once daily  #90 x 2   Entered and Authorized by:   Gordy Savers  MD   Signed by:   Gordy Savers  MD on 03/28/2009   Method used:   Print then Give to Patient   RxID:   [id number] LEXAPRO 10 MG TABS (ESCITALOPRAM OXALATE) 1 once daily  #90 Tablet x 2   Entered and Authorized by:   Gordy Savers  MD   Signed by:   Gordy Savers  MD on 03/28/2009   Method used:   Print then Give to Patient   RxID:   [id number] CHLORPROPAMIDE 250 MG TABS (CHLORPROPAMIDE) 1 once daily  #90 Tablet x 4   Entered and Authorized by:   Gordy Savers  MD   Signed by:   Gordy Savers  MD on  03/28/2009   Method used:   Print then Give to Patient   RxID:   [id number]

## 2010-02-23 ENCOUNTER — Other Ambulatory Visit: Payer: Self-pay | Admitting: Internal Medicine

## 2010-03-24 ENCOUNTER — Encounter: Payer: Self-pay | Admitting: Internal Medicine

## 2010-03-27 ENCOUNTER — Encounter: Payer: Self-pay | Admitting: Internal Medicine

## 2010-03-27 ENCOUNTER — Telehealth: Payer: Self-pay | Admitting: Internal Medicine

## 2010-03-27 ENCOUNTER — Ambulatory Visit (INDEPENDENT_AMBULATORY_CARE_PROVIDER_SITE_OTHER): Admitting: Internal Medicine

## 2010-03-27 DIAGNOSIS — I10 Essential (primary) hypertension: Secondary | ICD-10-CM

## 2010-03-27 DIAGNOSIS — M810 Age-related osteoporosis without current pathological fracture: Secondary | ICD-10-CM

## 2010-03-27 DIAGNOSIS — E119 Type 2 diabetes mellitus without complications: Secondary | ICD-10-CM

## 2010-03-27 DIAGNOSIS — F329 Major depressive disorder, single episode, unspecified: Secondary | ICD-10-CM

## 2010-03-27 DIAGNOSIS — D649 Anemia, unspecified: Secondary | ICD-10-CM

## 2010-03-27 DIAGNOSIS — E785 Hyperlipidemia, unspecified: Secondary | ICD-10-CM

## 2010-03-27 LAB — BASIC METABOLIC PANEL
BUN: 10 mg/dL (ref 6–23)
CO2: 31 mEq/L (ref 19–32)
Chloride: 99 mEq/L (ref 96–112)
Glucose, Bld: 104 mg/dL — ABNORMAL HIGH (ref 70–99)
Potassium: 4.7 mEq/L (ref 3.5–5.1)

## 2010-03-27 LAB — LIPID PANEL
HDL: 42.9 mg/dL (ref >39.00)
Total CHOL/HDL Ratio: 6
VLDL: 23.6 mg/dL (ref 0.0–40.0)

## 2010-03-27 LAB — HEPATIC FUNCTION PANEL
Bilirubin, Direct: 0.1 mg/dL (ref 0.0–0.3)
Total Bilirubin: 0.5 mg/dL (ref 0.3–1.2)
Total Protein: 7.3 g/dL (ref 6.0–8.3)

## 2010-03-27 LAB — CBC WITH DIFFERENTIAL/PLATELET
Basophils Absolute: 0 10*3/uL (ref 0.0–0.1)
Basophils Relative: 0.9 % (ref 0.0–3.0)
Eosinophils Absolute: 0.1 10*3/uL (ref 0.0–0.7)
HCT: 37.7 % (ref 36.0–46.0)
Lymphocytes Relative: 29.9 % (ref 12.0–46.0)
MCV: 92.1 fl (ref 78.0–100.0)
Monocytes Relative: 10.8 % (ref 3.0–12.0)
Neutro Abs: 2.4 10*3/uL (ref 1.4–7.7)
Neutrophils Relative %: 55.6 % (ref 43.0–77.0)
Platelets: 258 10*3/uL (ref 150.0–400.0)
WBC: 4.3 10*3/uL — ABNORMAL LOW (ref 4.5–10.5)

## 2010-03-27 LAB — LDL CHOLESTEROL, DIRECT: Direct LDL: 174.1 mg/dL

## 2010-03-27 MED ORDER — POTASSIUM CHLORIDE CRYS ER 20 MEQ PO TBCR: 20.0000 meq | EXTENDED_RELEASE_TABLET | Freq: Two times a day (BID) | ORAL | Status: DC

## 2010-03-27 MED ORDER — ATORVASTATIN CALCIUM 80 MG PO TABS: 80.0000 mg | ORAL_TABLET | Freq: Every day | ORAL | Status: DC

## 2010-03-27 MED ORDER — RISEDRONATE SODIUM 150 MG PO TABS: 150.0000 mg | ORAL_TABLET | ORAL | Status: DC

## 2010-03-27 MED ORDER — ESCITALOPRAM OXALATE 10 MG PO TABS: 10.0000 mg | ORAL_TABLET | Freq: Every day | ORAL | Status: DC

## 2010-03-27 MED ORDER — CHLORPROPAMIDE 250 MG PO TABS: 250.0000 mg | ORAL_TABLET | Freq: Every day | ORAL | Status: DC

## 2010-03-27 MED ORDER — LISINOPRIL-HYDROCHLOROTHIAZIDE 20-25 MG PO TABS: 1.0000 | ORAL_TABLET | Freq: Every day | ORAL | Status: DC

## 2010-03-27 NOTE — Progress Notes (Signed)
  Subjective:    Patient ID: Chelsea Moreno, female    DOB: 07-09-1950, 60 y.o.   MRN: [id number]  HPI Wt Readings from Last 3 Encounters:  03/27/10 210 lb (95.255 kg)  09/26/09 215 lb (97.523 kg)  05/10/09 213 lb (96.616 kg)     Review of Systems     Objective:   Physical Exam        Assessment & Plan:

## 2010-03-27 NOTE — Patient Instructions (Signed)
Limit your sodium (Salt) intake  Take a calcium supplement, plus 800-1200 units of vitamin D    It is important that you exercise regularly, at least 20 minutes 3 to 4 times per week.  If you develop chest pain or shortness of breath seek  medical attention.  Return in 6 months for follow-up 

## 2010-03-27 NOTE — Progress Notes (Signed)
  Subjective:    Patient ID: Chelsea Moreno, female    DOB: Jan 07, 1951, 60 y.o.   MRN: [id number]  HPI   60 year old patient who is seen today for followup. She has a history of hypertension and dyslipidemia. She is followed by gynecology annually. She has osteoporosis and is on Actonel. No concerns or complaints today except for some left lateral ankle pain that has been present for months. This is fairly minor. She remains on Lipitor 80 mg daily which he tolerates well. She has a history of diabetes insipidus   Review of Systems  Constitutional: Negative.   HENT: Negative for hearing loss, congestion, sore throat, rhinorrhea, dental problem, sinus pressure and tinnitus.   Eyes: Negative for pain, discharge and visual disturbance.  Respiratory: Negative for cough and shortness of breath.   Cardiovascular: Negative for chest pain, palpitations and leg swelling.  Gastrointestinal: Negative for nausea, vomiting, abdominal pain, diarrhea, constipation, blood in stool and abdominal distention.  Genitourinary: Negative for dysuria, urgency, frequency, hematuria, flank pain, vaginal bleeding, vaginal discharge, difficulty urinating, vaginal pain and pelvic pain.  Musculoskeletal: Positive for arthralgias. Negative for joint swelling and gait problem.  Skin: Negative for rash.  Neurological: Negative for dizziness, syncope, speech difficulty, weakness, numbness and headaches.  Hematological: Negative for adenopathy.  Psychiatric/Behavioral: Negative for behavioral problems, dysphoric mood and agitation. The patient is not nervous/anxious.        Objective:   Physical Exam  Constitutional: She is oriented to person, place, and time. She appears well-developed and well-nourished. No distress.  HENT:  Head: Normocephalic.  Right Ear: External ear normal.  Left Ear: External ear normal.  Mouth/Throat: Oropharynx is clear and moist.  Eyes: Conjunctivae and EOM are normal. Pupils are equal, round,  and reactive to light.  Neck: Normal range of motion. Neck supple. No thyromegaly present.  Cardiovascular: Normal rate, regular rhythm, normal heart sounds and intact distal pulses.   Pulmonary/Chest: Effort normal and breath sounds normal.  Abdominal: Soft. Bowel sounds are normal. She exhibits no mass. There is no tenderness.  Musculoskeletal: Normal range of motion.  Lymphadenopathy:    She has no cervical adenopathy.  Neurological: She is alert and oriented to person, place, and time.  Skin: Skin is warm and dry. No rash noted.  Psychiatric: She has a normal mood and affect. Her behavior is normal.          Assessment & Plan:   hypertension well controlled  Dyslipidemia we'll check a fasting lipid profile today  Diabetes insipidus. We'll check electrolytes   Recheck in 6 months

## 2010-03-27 NOTE — Telephone Encounter (Signed)
Pharmacist called re: pt Klorcon 20 mg. Need clarification. Pls call back asap. All other meds were 90 day supply. The Klorcon works out to be a 45 day supply as written.

## 2010-03-27 NOTE — Telephone Encounter (Signed)
Should of been for #180 - corrected quanity

## 2010-03-31 ENCOUNTER — Telehealth: Payer: Self-pay | Admitting: Internal Medicine

## 2010-03-31 NOTE — Telephone Encounter (Signed)
Spoke with pt - discussed labs - diet and exercise r/t elevaled cholesterol Copy of lab to pick up ready. KIK

## 2010-03-31 NOTE — Telephone Encounter (Signed)
Pt called req lab results. Pls call asap.   °

## 2010-05-08 ENCOUNTER — Telehealth: Payer: Self-pay | Admitting: Internal Medicine

## 2010-05-08 MED ORDER — MECLIZINE HCL 25 MG PO TABS: 25.0000 mg | ORAL_TABLET | ORAL | Status: AC | PRN

## 2010-05-08 NOTE — Telephone Encounter (Signed)
Patient gets motion sickness while traveling. She will be on a plane and would like rx for motion sickness called to [facility].

## 2010-05-08 NOTE — Telephone Encounter (Signed)
Spoke with pt - informed of med sent to [facility]

## 2010-05-08 NOTE — Telephone Encounter (Signed)
Please advise 

## 2010-05-08 NOTE — Telephone Encounter (Signed)
Please call and meclizine 25 mg #20 take every 4-6 hours as needed for nausea or motion sickness

## 2010-05-10 ENCOUNTER — Telehealth: Payer: Self-pay | Admitting: Internal Medicine

## 2010-05-10 NOTE — Telephone Encounter (Signed)
Spoke with pt after calling [facility] - per chris - they do have rx i took care of 4/30.kik

## 2010-05-10 NOTE — Telephone Encounter (Signed)
Pt called and said that she went to [facility] on [address] last night and was told by pharmacist that med for motion sickness has not been sent to pharmacy. Pls call in to [facility] or pt says that she can pick up written script if necessary.

## 2010-05-23 NOTE — Consult Note (Signed)
NAME:  Chelsea Moreno, Chelsea Moreno NO.:  [id number]   MEDICAL RECORD NO.:  [id number]          PATIENT TYPE:  EMS   LOCATION:  ED                           FACILITY:  [facility]   PHYSICIAN:  Alvy Beal, MD    DATE OF BIRTH:  May 07, 1950   DATE OF CONSULTATION:  DATE OF DISCHARGE:                                 CONSULTATION   CONSULTING DIAGNOSIS:  Left cuboid avulsion fracture.   HISTORY:  This is a very pleasant 60 year old with a history of diabetes  insipidus who was walking down a flight of stairs when she missed the  last one and twisted her ankle and foot.  She noted immediate pain over  the dorsum of the foot and presented to the emergency room because of  inability to ambulate.  X-rays were taken, and she was diagnosed with a  cuboid avulsion fracture and ortho consultation was requested.   Her past medical, surgical, family, and social history is significant  only for diabetes insipidus.  She is otherwise healthy with no other  significant medical issues.  Please refer to the ER consultation note  for specifics on the medications, allergies and past medical and  surgical and family history.  I have reviewed that.   CLINICAL EXAM:  She is currently in a wheelchair.  She is comfortable.  She has no hip or knee tenderness on direct evaluation of the left side.  She has no medial or lateral malleolar tenderness.  No posterior  malleolar  tenderness.  She has tenderness and pain over the dorsum of  the cuboid (midfoot).  She is grossly neurologically intact.  There is  no significant swelling of the foot or calf.  It is soft.   Neurovascularly she is intact with no sensory deficits.  She is moving  all her toes.  Cap refill is less than 2 seconds.   X-rays confirmed a cuboid avulsion fracture.  No other significant  injuries noted.   PLAN:  The patient was placed into a Cam walker, instructed on  weightbearing as tolerated with assistive device and given a  prescription for pain medication.  She will be discharged from the ER  and follow up with me in about 7 to 10 days for a repeat evaluation.      Alvy Beal, MD  Electronically Signed     DDB/MEDQ  D:  05/02/2007  T:  05/02/2007  Job:  [id number]

## 2010-08-31 ENCOUNTER — Other Ambulatory Visit: Payer: Self-pay | Admitting: Internal Medicine

## 2010-09-06 LAB — HM DEXA SCAN

## 2010-09-25 ENCOUNTER — Ambulatory Visit (INDEPENDENT_AMBULATORY_CARE_PROVIDER_SITE_OTHER): Admitting: Internal Medicine

## 2010-09-25 ENCOUNTER — Encounter: Payer: Self-pay | Admitting: Internal Medicine

## 2010-09-25 VITALS — BP 118/80 | Temp 98.4°F | Wt 213.0 lb

## 2010-09-25 DIAGNOSIS — I10 Essential (primary) hypertension: Secondary | ICD-10-CM

## 2010-09-25 DIAGNOSIS — E785 Hyperlipidemia, unspecified: Secondary | ICD-10-CM

## 2010-09-25 DIAGNOSIS — Z23 Encounter for immunization: Secondary | ICD-10-CM

## 2010-09-25 DIAGNOSIS — Z0000 Encounter for general adult medical examination without abnormal findings: Secondary | ICD-10-CM

## 2010-09-25 DIAGNOSIS — M810 Age-related osteoporosis without current pathological fracture: Secondary | ICD-10-CM

## 2010-09-25 NOTE — Progress Notes (Signed)
  Subjective:    Patient ID: Chelsea Moreno, female    DOB: December 02, 1950, 60 y.o.   MRN: [id number]  HPI 31 -year-old patient who is seen today for followup. She has treated hypertension and dyslipidemia and is doing quite well. She has had a recent gynecologic exam which included a bone density. She has osteo-porosis and has been on Actonel for approximately one year. No concerns or complaints. She has a history of diabetes insipidus which remains well controlled on Diabinese. No urinary frequency Denies any cardiopulmonary complaints Has a history of exogenous obesity    Review of Systems  Constitutional: Negative.   HENT: Negative for hearing loss, congestion, sore throat, rhinorrhea, dental problem, sinus pressure and tinnitus.   Eyes: Negative for pain, discharge and visual disturbance.  Respiratory: Negative for cough and shortness of breath.   Cardiovascular: Negative for chest pain, palpitations and leg swelling.  Gastrointestinal: Negative for nausea, vomiting, abdominal pain, diarrhea, constipation, blood in stool and abdominal distention.  Genitourinary: Negative for dysuria, urgency, frequency, hematuria, flank pain, vaginal bleeding, vaginal discharge, difficulty urinating, vaginal pain and pelvic pain.  Musculoskeletal: Negative for joint swelling, arthralgias and gait problem.  Skin: Negative for rash.  Neurological: Negative for dizziness, syncope, speech difficulty, weakness, numbness and headaches.  Hematological: Negative for adenopathy.  Psychiatric/Behavioral: Negative for behavioral problems, dysphoric mood and agitation. The patient is not nervous/anxious.        Objective:   Physical Exam  Constitutional: She is oriented to person, place, and time. She appears well-developed and well-nourished.  HENT:  Head: Normocephalic.  Right Ear: External ear normal.  Left Ear: External ear normal.  Mouth/Throat: Oropharynx is clear and moist.  Eyes: Conjunctivae and EOM  are normal. Pupils are equal, round, and reactive to light.  Neck: Normal range of motion. Neck supple. No thyromegaly present.  Cardiovascular: Normal rate, regular rhythm, normal heart sounds and intact distal pulses.   Pulmonary/Chest: Effort normal and breath sounds normal.  Abdominal: Soft. Bowel sounds are normal. She exhibits no mass. There is no tenderness.  Musculoskeletal: Normal range of motion.  Lymphadenopathy:    She has no cervical adenopathy.  Neurological: She is alert and oriented to person, place, and time.  Skin: Skin is warm and dry. No rash noted.  Psychiatric: She has a normal mood and affect. Her behavior is normal.          Assessment & Plan:   Hypertension. Well controlled we'll continue combination therapy Dyslipidemia. Will continue Lipitor 80 mg daily will check lipid profile at the time of her complete exam in 6 months Exogenous obesity. Weight loss exercise encouraged Diabetes insipidus. Stable on present regimen

## 2010-09-25 NOTE — Patient Instructions (Signed)
Limit your sodium (Salt) intake    It is important that you exercise regularly, at least 20 minutes 3 to 4 times per week.  If you develop chest pain or shortness of breath seek  medical attention.  You need to lose weight.  Consider a lower calorie diet and regular exercise.  Return in 6 months for follow-up   

## 2010-10-16 ENCOUNTER — Telehealth: Payer: Self-pay | Admitting: Internal Medicine

## 2010-10-16 NOTE — Telephone Encounter (Signed)
Suggested that when she is no longer able to take Diabinese that she challenged her self off all medication but to report frequent urination promptly  for alternative medication;  suggest that she give herself a trial off medication early in the week and not prior to a weekend

## 2010-10-16 NOTE — Telephone Encounter (Signed)
Pt called and said that the pharmacist told pt that chlorproPAMIDE (DIABINESE) 250 MG tablet is being discontined. Pharmacy just filled her med, which will last the pt a couple of months. Pt will no longer be able to get this med.  Pt is going to need an alternative med prescribed. [facility] on [address].

## 2010-10-16 NOTE — Telephone Encounter (Signed)
Please advise 

## 2010-10-17 NOTE — Telephone Encounter (Signed)
Attempt to call - ans mach - LMTCB to discuss dr. Vernon Prey instructions. Would like for her to try coming off med - report freq. Urination prompmtly ,will need med at that time. Call to discuss

## 2011-03-21 ENCOUNTER — Other Ambulatory Visit (INDEPENDENT_AMBULATORY_CARE_PROVIDER_SITE_OTHER)

## 2011-03-21 DIAGNOSIS — Z0000 Encounter for general adult medical examination without abnormal findings: Secondary | ICD-10-CM

## 2011-03-21 LAB — HEPATIC FUNCTION PANEL
ALT: 22 U/L (ref 0–35)
AST: 24 U/L (ref 0–37)
Albumin: 4.4 g/dL (ref 3.5–5.2)
Alkaline Phosphatase: 73 U/L (ref 39–117)
Bilirubin, Direct: 0 mg/dL (ref 0.0–0.3)
Total Protein: 7.4 g/dL (ref 6.0–8.3)

## 2011-03-21 LAB — POCT URINALYSIS DIPSTICK
Bilirubin, UA: NEGATIVE
Nitrite, UA: NEGATIVE
Protein, UA: NEGATIVE
Urobilinogen, UA: 0.2
pH, UA: 6.5

## 2011-03-21 LAB — CBC WITH DIFFERENTIAL/PLATELET
Basophils Relative: 1.2 % (ref 0.0–3.0)
Eosinophils Relative: 4.1 % (ref 0.0–5.0)
Hemoglobin: 13.8 g/dL (ref 12.0–15.0)
Lymphocytes Relative: 32.1 % (ref 12.0–46.0)
Monocytes Relative: 8.8 % (ref 3.0–12.0)
Neutro Abs: 2.8 10*3/uL (ref 1.4–7.7)
Neutrophils Relative %: 53.8 % (ref 43.0–77.0)
RBC: 4.45 Mil/uL (ref 3.87–5.11)
WBC: 5.3 10*3/uL (ref 4.5–10.5)

## 2011-03-21 LAB — BASIC METABOLIC PANEL
CO2: 31 mEq/L (ref 19–32)
Calcium: 9.6 mg/dL (ref 8.4–10.5)
Creatinine, Ser: 0.7 mg/dL (ref 0.4–1.2)
Sodium: 142 mEq/L (ref 135–145)

## 2011-03-21 LAB — LIPID PANEL
Total CHOL/HDL Ratio: 6
Triglycerides: 229 mg/dL — ABNORMAL HIGH (ref 0.0–149.0)

## 2011-03-28 ENCOUNTER — Encounter: Payer: Self-pay | Admitting: Internal Medicine

## 2011-03-28 ENCOUNTER — Ambulatory Visit (INDEPENDENT_AMBULATORY_CARE_PROVIDER_SITE_OTHER): Admitting: Internal Medicine

## 2011-03-28 VITALS — BP 110/70 | HR 80 | Temp 98.2°F | Resp 18 | Ht 63.0 in | Wt 206.0 lb

## 2011-03-28 DIAGNOSIS — Z0000 Encounter for general adult medical examination without abnormal findings: Secondary | ICD-10-CM

## 2011-03-28 DIAGNOSIS — E232 Diabetes insipidus: Secondary | ICD-10-CM

## 2011-03-28 DIAGNOSIS — E785 Hyperlipidemia, unspecified: Secondary | ICD-10-CM

## 2011-03-28 DIAGNOSIS — I10 Essential (primary) hypertension: Secondary | ICD-10-CM

## 2011-03-28 MED ORDER — MELOXICAM 15 MG PO TABS: 15.0000 mg | ORAL_TABLET | Freq: Every day | ORAL | Status: DC

## 2011-03-28 MED ORDER — DESMOPRESSIN ACE RHINAL TUBE 0.01 % NA SOLN: 10.0000 ug | Freq: Every day | NASAL | Status: DC

## 2011-03-28 NOTE — Progress Notes (Signed)
Subjective:    Patient ID: Chelsea Moreno, female    DOB: 1950-02-19, 61 y.o.   MRN: [id number]  HPI Wt Readings from Last 3 Encounters:  03/28/11 206 lb (93.441 kg)  09/25/10 213 lb (96.616 kg)  03/27/10 210 lb (95.29 kg)   61 year old patient who is seen today for an annual physical. Medical problems include treated hypertension and dyslipidemia. She is followed by gynecology. She also has a history colonic polyps her last colonoscopy was about 3 years ago She has a history also of diabetes insipidus but unfortunately there will be no further manufacturing of Diabinese. Presently she is taking this 2-3 times per week due to her short supply. When she makes an attempt to stop the medication she develops extreme dry mouth polyuria and polydipsia.  Past Medical History  Diagnosis Date  . COLONIC POLYPS, HX OF 09/24/2008  . DEPRESSION 09/23/2007  . HYPERLIPIDEMIA 09/06/2006  . HYPERTENSION 09/06/2006  . OSTEOPOROSIS 09/24/2008    History   Social History  . Marital Status: Widowed    Spouse Name: N/A    Number of Children: N/A  . Years of Education: N/A   Occupational History  . Not on file.   Social History Main Topics  . Smoking status: Former Smoker    Quit date: 01/09/1968  . Smokeless tobacco: Never Used  . Alcohol Use: No  . Drug Use: No  . Sexually Active: Not on file   Other Topics Concern  . Not on file   Social History Narrative  . No narrative on file    No past surgical history on file.  No family history on file.  No Known Allergies  Current Outpatient Prescriptions on File Prior to Visit  Medication Sig Dispense Refill  . atorvastatin (LIPITOR) 80 MG tablet Take 1 tablet (80 mg total) by mouth daily.  90 tablet  6  . bimatoprost (LUMIGAN) 0.03 % ophthalmic drops 1 drop. As directed       . chlorproPAMIDE (DIABINESE) 250 MG tablet Take 1 tablet (250 mg total) by mouth daily with breakfast.  90 tablet  4  . Cholecalciferol (VITAMIN D) 2000 UNITS CAPS  Take by mouth daily.        Marland Kitchen escitalopram (LEXAPRO) 10 MG tablet Take 1 tablet (10 mg total) by mouth daily.  90 tablet  6  . lisinopril-hydrochlorothiazide (PRINZIDE,ZESTORETIC) 20-25 MG per tablet Take 1 tablet by mouth daily.  90 tablet  6  . meclizine (ANTIVERT) 25 MG tablet Take 1 tablet (25 mg total) by mouth every 4 (four) hours as needed for dizziness or nausea.  20 tablet  0  . potassium chloride SA (K-DUR,KLOR-CON) 20 MEQ tablet Take 1 tablet (20 mEq total) by mouth 2 (two) times daily.  90 tablet  6  . risedronate (ACTONEL) 150 MG tablet Take 1 tablet (150 mg total) by mouth every 30 (thirty) days. with water on empty stomach, nothing by mouth or lie down for next 30 minutes.  12 tablet  6    BP 110/70  Pulse 80  Temp(Src) 98.2 F (36.8 C) (Oral)  Resp 18  Ht 5\' 3"  (1.6 m)  Wt 206 lb (93.441 kg)  BMI 36.49 kg/m2  SpO2 96%     Review of Systems  Constitutional: Negative for fever, appetite change, fatigue and unexpected weight change.  HENT: Negative for hearing loss, ear pain, nosebleeds, congestion, sore throat, mouth sores, trouble swallowing, neck stiffness, dental problem, voice change, sinus pressure and tinnitus.  Eyes: Negative for photophobia, pain, redness and visual disturbance.  Respiratory: Negative for cough, chest tightness and shortness of breath.   Cardiovascular: Negative for chest pain, palpitations and leg swelling.  Gastrointestinal: Negative for nausea, vomiting, abdominal pain, diarrhea, constipation, blood in stool, abdominal distention and rectal pain.  Genitourinary: Negative for dysuria, urgency, frequency, hematuria, flank pain, vaginal bleeding, vaginal discharge, difficulty urinating, genital sores, vaginal pain, menstrual problem and pelvic pain.  Musculoskeletal: Negative for back pain and arthralgias.  Skin: Negative for rash.  Neurological: Negative for dizziness, syncope, speech difficulty, weakness, light-headedness, numbness and  headaches.  Hematological: Negative for adenopathy. Does not bruise/bleed easily.  Psychiatric/Behavioral: Negative for suicidal ideas, behavioral problems, self-injury, dysphoric mood and agitation. The patient is not nervous/anxious.        Objective:   Physical Exam  Constitutional: She is oriented to person, place, and time. She appears well-developed and well-nourished.       Obese. Blood pressure low normal  HENT:  Head: Normocephalic and atraumatic.  Right Ear: External ear normal.  Left Ear: External ear normal.  Mouth/Throat: Oropharynx is clear and moist.       Low hanging soft palate with pharyngeal crowding  Eyes: Conjunctivae and EOM are normal.  Neck: Normal range of motion. Neck supple. No JVD present. No thyromegaly present.  Cardiovascular: Normal rate, regular rhythm, normal heart sounds and intact distal pulses.   No murmur heard. Pulmonary/Chest: Effort normal and breath sounds normal. She has no wheezes. She has no rales.  Abdominal: Soft. Bowel sounds are normal. She exhibits no distension and no mass. There is no tenderness. There is no rebound and no guarding.  Genitourinary: Vagina normal.  Musculoskeletal: Normal range of motion. She exhibits no edema and no tenderness.  Neurological: She is alert and oriented to person, place, and time. She has normal reflexes. No cranial nerve deficit. She exhibits normal muscle tone. Coordination normal.  Skin: Skin is warm and dry. No rash noted.  Psychiatric: She has a normal mood and affect. Her behavior is normal.          Assessment & Plan:   Diabetes insipidus. Chlorpropamide we'll longer be available will need alternate treatment Hypertension stable Dyslipidemia continue atorvastatin 80 Obesity/OSA suspect with history of loud snoring and some daytime sleepiness. For the weight loss encouraged. She will consider a sleep study  GYN followup Continue calcium and vitamin D supplements Consider trial off  Lexapro

## 2011-03-28 NOTE — Patient Instructions (Signed)
Limit your sodium (Salt) intake  Trial mobic  Return in 3 months for follow-up

## 2011-04-11 ENCOUNTER — Other Ambulatory Visit: Payer: Self-pay | Admitting: Internal Medicine

## 2011-04-18 ENCOUNTER — Other Ambulatory Visit: Payer: Self-pay | Admitting: Internal Medicine

## 2011-06-18 ENCOUNTER — Other Ambulatory Visit: Payer: Self-pay | Admitting: Ophthalmology

## 2011-06-28 ENCOUNTER — Encounter: Payer: Self-pay | Admitting: Internal Medicine

## 2011-06-28 ENCOUNTER — Ambulatory Visit (INDEPENDENT_AMBULATORY_CARE_PROVIDER_SITE_OTHER): Admitting: Internal Medicine

## 2011-06-28 VITALS — BP 118/80 | Temp 97.9°F | Wt 212.0 lb

## 2011-06-28 DIAGNOSIS — I10 Essential (primary) hypertension: Secondary | ICD-10-CM

## 2011-06-28 DIAGNOSIS — E785 Hyperlipidemia, unspecified: Secondary | ICD-10-CM

## 2011-06-28 DIAGNOSIS — E232 Diabetes insipidus: Secondary | ICD-10-CM

## 2011-06-28 MED ORDER — DESMOPRESSIN ACE RHINAL TUBE 0.01 % NA SOLN: NASAL | Status: DC

## 2011-06-28 NOTE — Progress Notes (Signed)
  Subjective:    Patient ID: Chelsea Moreno, female    DOB: October 17, 1950, 61 y.o.   MRN: [id number]  HPI  61 year old patient who is seen today for followup of diabetes insipidus. This is now controlled with desmopressin one spray twice daily. She has done well off Lexapro and has had no recurrent depression. She has treated hypertension which has been stable. Compliance with Lipitor stressed.   Review of Systems  Constitutional: Negative.   HENT: Negative for hearing loss, congestion, sore throat, rhinorrhea, dental problem, sinus pressure and tinnitus.   Eyes: Negative for pain, discharge and visual disturbance.  Respiratory: Negative for cough and shortness of breath.   Cardiovascular: Negative for chest pain, palpitations and leg swelling.  Gastrointestinal: Negative for nausea, vomiting, abdominal pain, diarrhea, constipation, blood in stool and abdominal distention.  Genitourinary: Negative for dysuria, urgency, frequency, hematuria, flank pain, vaginal bleeding, vaginal discharge, difficulty urinating, vaginal pain and pelvic pain.  Musculoskeletal: Negative for joint swelling, arthralgias and gait problem.  Skin: Negative for rash.  Neurological: Negative for dizziness, syncope, speech difficulty, weakness, numbness and headaches.  Hematological: Negative for adenopathy.  Psychiatric/Behavioral: Negative for behavioral problems, dysphoric mood and agitation. The patient is not nervous/anxious.        Objective:   Physical Exam  Constitutional: She appears well-developed and well-nourished. No distress.       Weight 212 Blood pressure well controlled          Assessment & Plan:   Hypertension. Well controlled Diabetes insipidus. Well controlled on one spray twice daily  Recheck 6 months Weight loss encouraged

## 2011-06-28 NOTE — Patient Instructions (Signed)
Limit your sodium (Salt) intake    It is important that you exercise regularly, at least 20 minutes 3 to 4 times per week.  If you develop chest pain or shortness of breath seek  medical attention.  You need to lose weight.  Consider a lower calorie diet and regular exercise. 

## 2011-10-31 ENCOUNTER — Other Ambulatory Visit: Payer: Self-pay | Admitting: Obstetrics & Gynecology

## 2011-11-16 ENCOUNTER — Ambulatory Visit (INDEPENDENT_AMBULATORY_CARE_PROVIDER_SITE_OTHER): Admitting: Internal Medicine

## 2011-11-16 ENCOUNTER — Encounter: Payer: Self-pay | Admitting: Internal Medicine

## 2011-11-16 VITALS — BP 112/70 | Temp 98.0°F | Wt 207.0 lb

## 2011-11-16 DIAGNOSIS — Z23 Encounter for immunization: Secondary | ICD-10-CM

## 2011-11-16 DIAGNOSIS — M76899 Other specified enthesopathies of unspecified lower limb, excluding foot: Secondary | ICD-10-CM

## 2011-11-16 DIAGNOSIS — M7072 Other bursitis of hip, left hip: Secondary | ICD-10-CM

## 2011-11-16 DIAGNOSIS — I10 Essential (primary) hypertension: Secondary | ICD-10-CM

## 2011-11-16 MED ORDER — TRAMADOL HCL 50 MG PO TABS: 50.0000 mg | ORAL_TABLET | Freq: Three times a day (TID) | ORAL | Status: DC | PRN

## 2011-11-16 NOTE — Progress Notes (Signed)
  Subjective:    Patient ID: Chelsea Moreno, female    DOB: Sep 29, 1950, 61 y.o.   MRN: [id number]  HPI 61 year old patient who has treated hypertension. She also has a history of diabetes insipidus. She presents with a two-day history of left lateral atraumatic hip pain. Pain is aggravated by walking. No fever or other constitutional complaints. She has no local tenderness over the left lateral hip region      Review of Systems  Constitutional: Negative.   HENT: Negative for hearing loss, congestion, sore throat, rhinorrhea, dental problem, sinus pressure and tinnitus.   Eyes: Negative for pain, discharge and visual disturbance.  Respiratory: Negative for cough and shortness of breath.   Cardiovascular: Negative for chest pain, palpitations and leg swelling.  Gastrointestinal: Negative for nausea, vomiting, abdominal pain, diarrhea, constipation, blood in stool and abdominal distention.  Genitourinary: Negative for dysuria, urgency, frequency, hematuria, flank pain, vaginal bleeding, vaginal discharge, difficulty urinating, vaginal pain and pelvic pain.  Musculoskeletal: Positive for arthralgias (left lateral hip pain) and gait problem. Negative for joint swelling.  Skin: Negative for rash.  Neurological: Negative for dizziness, syncope, speech difficulty, weakness, numbness and headaches.  Hematological: Negative for adenopathy.  Psychiatric/Behavioral: Negative for behavioral problems, dysphoric mood and agitation. The patient is not nervous/anxious.        Objective:   Physical Exam  Constitutional: She appears well-developed and well-nourished. No distress.       Blood pressure control  Musculoskeletal:       Range of motion of the left hip intact. She did have some local tenderness over the left lateral hip area straight leg testing normal          Assessment & Plan:     Left hip bursitis. Will treat with Depo-Medrol 80 mg IM. Will also give a prescription for tramadol as  needed

## 2011-11-16 NOTE — Patient Instructions (Signed)
You  may move around, but avoid painful motions and activities.  Apply ice to the sore area for 15 to 20 minutes 3 or 4 times daily for the next two to 3 days.     Marland KitchenDischarge Instructions Browse by Alphabet A B C D E F G H I J K L M N O P Q R S T U V W X Y Z  Browse by Category All Documents Allergy and Immunology Anesthesiology [facility] Bioterrorism Cardiology Critical Care Dentistry Dermatology Diabetes Dietary Easy-to-Read Emergency Medicine Endocrinology ENT Family Medicine Forms Gastroenterology Geriatrics Hematology Home Health Care Infectious Disease Internal Medicine Labs and Tests Neonatology Nephrology Neurology Obstetrics and Gynecology Oncology Ophthalmology Orthopedics Pediatrics Pharmacology Physical Medicine and Rehabilitation Podiatry Preventive Medicine Procedures Psychiatry Pulmonary Medicine Radiology Rheumatology Surgery Urology Drug Information Sheets All Drug Information Sheets  Browse by Alphabet A B C D E F G H I J K L M N O P Q R S T U V W X Y Z Hip Bursitis Bursitis is a swelling and soreness (inflammation) of a fluid-filled sac (bursa). This sac overlies and protects the joints.   CAUSES    Injury.   Overuse of the muscles surrounding the joint.   Arthritis.   Gout.   Infection.   Cold weather.   Inadequate warm-up and conditioning prior to activities.  The cause may not be known.   SYMPTOMS    Mild to severe irritation.   Tenderness and swelling over the outside of the hip.   Pain with motion of the hip.   If the bursa becomes infected, a fever may be present. Redness, tenderness, and warmth will develop over the hip.  Symptoms usually lessen in 3 to 4 weeks with treatment, but can come back. TREATMENT If conservative treatment does not work, your caregiver may advise draining the bursa and injecting cortisone into the area. This may speed up the healing process. This may also be used as an initial  treatment of choice. HOME CARE INSTRUCTIONS    Apply ice to the affected area for 15 to 20 minutes every 3 to 4 hours while awake for the first 2 days. Put the ice in a plastic bag and place a towel between the bag of ice and your skin.   Rest the painful joint as much as possible, but continue to put the joint through a normal range of motion at least 4 times per day. When the pain lessens, begin normal, slow movements and usual activities to help prevent stiffness of the hip.   Only take over-the-counter or prescription medicines for pain, discomfort, or fever as directed by your caregiver.   Use crutches to limit weight bearing on the hip joint, if advised.   Elevate your painful hip to reduce swelling. Use pillows for propping and cushioning your legs and hips.   Gentle massage may provide comfort and decrease swelling.  SEEK IMMEDIATE MEDICAL CARE IF:    Your pain increases even during treatment, or you are not improving.   You have a fever.   You have heat and inflammation over the involved bursa.   You have any other questions or concerns.  MAKE SURE YOU:    Understand these instructions.   Will watch your condition.   Will get help right away if you are not doing well or get worse.  Document Released: 06/16/2001 Document Revised: 03/19/2011 Document Reviewed: 01/14/2008 [facility] Patient Information 2013 [place], [place].

## 2011-12-27 ENCOUNTER — Ambulatory Visit: Admitting: Internal Medicine

## 2012-01-14 ENCOUNTER — Encounter: Payer: Self-pay | Admitting: Internal Medicine

## 2012-01-14 ENCOUNTER — Ambulatory Visit (INDEPENDENT_AMBULATORY_CARE_PROVIDER_SITE_OTHER): Admitting: Internal Medicine

## 2012-01-14 VITALS — BP 140/80 | HR 83 | Temp 98.5°F | Resp 18 | Wt 209.0 lb

## 2012-01-14 DIAGNOSIS — E232 Diabetes insipidus: Secondary | ICD-10-CM

## 2012-01-14 DIAGNOSIS — I10 Essential (primary) hypertension: Secondary | ICD-10-CM

## 2012-01-14 DIAGNOSIS — E785 Hyperlipidemia, unspecified: Secondary | ICD-10-CM

## 2012-01-14 DIAGNOSIS — M810 Age-related osteoporosis without current pathological fracture: Secondary | ICD-10-CM

## 2012-01-14 MED ORDER — LISINOPRIL-HYDROCHLOROTHIAZIDE 20-25 MG PO TABS: 1.0000 | ORAL_TABLET | Freq: Every day | ORAL | Status: DC

## 2012-01-14 MED ORDER — TRAMADOL HCL 50 MG PO TABS: 50.0000 mg | ORAL_TABLET | Freq: Three times a day (TID) | ORAL | Status: DC | PRN

## 2012-01-14 MED ORDER — DESMOPRESSIN ACE RHINAL TUBE 0.01 % NA SOLN: NASAL | Status: DC

## 2012-01-14 MED ORDER — POTASSIUM CHLORIDE CRYS ER 20 MEQ PO TBCR: 20.0000 meq | EXTENDED_RELEASE_TABLET | Freq: Two times a day (BID) | ORAL | Status: DC

## 2012-01-14 MED ORDER — RISEDRONATE SODIUM 150 MG PO TABS: 150.0000 mg | ORAL_TABLET | ORAL | Status: DC

## 2012-01-14 NOTE — Progress Notes (Signed)
  Subjective:    Patient ID: Chelsea Moreno, female    DOB: [birth year], 62 y.o.   MRN: [id number]  HPI  62 year old patient who is seen today for followup. She has a history of hypertension and dyslipidemia. She has diabetes insipidus. They're quite well today without concerns or complaints. Has had a recent gynecologic evaluation  Wt Readings from Last 3 Encounters:  01/14/12 209 lb (94.802 kg)  11/16/11 207 lb (93.895 kg)  06/28/11 212 lb (96.163 kg)    BP Readings from Last 3 Encounters:  01/14/12 140/80  11/16/11 112/70  06/28/11 118/80    Review of Systems  Constitutional: Negative.   HENT: Negative for hearing loss, congestion, sore throat, rhinorrhea, dental problem, sinus pressure and tinnitus.   Eyes: Negative for pain, discharge and visual disturbance.  Respiratory: Negative for cough and shortness of breath.   Cardiovascular: Negative for chest pain, palpitations and leg swelling.  Gastrointestinal: Negative for nausea, vomiting, abdominal pain, diarrhea, constipation, blood in stool and abdominal distention.  Genitourinary: Negative for dysuria, urgency, frequency, hematuria, flank pain, vaginal bleeding, vaginal discharge, difficulty urinating, vaginal pain and pelvic pain.  Musculoskeletal: Negative for joint swelling, arthralgias and gait problem.  Skin: Negative for rash.  Neurological: Negative for dizziness, syncope, speech difficulty, weakness, numbness and headaches.  Hematological: Negative for adenopathy.  Psychiatric/Behavioral: Negative for behavioral problems, dysphoric mood and agitation. The patient is not nervous/anxious.        Objective:   Physical Exam  Constitutional: She is oriented to person, place, and time. She appears well-developed and well-nourished.  HENT:  Head: Normocephalic.  Right Ear: External ear normal.  Left Ear: External ear normal.  Mouth/Throat: Oropharynx is clear and moist.  Eyes: Conjunctivae normal and EOM are normal.  Pupils are equal, round, and reactive to light.  Neck: Normal range of motion. Neck supple. No thyromegaly present.  Cardiovascular: Normal rate, regular rhythm, normal heart sounds and intact distal pulses.   Pulmonary/Chest: Effort normal and breath sounds normal.  Abdominal: Soft. Bowel sounds are normal. She exhibits no mass. There is no tenderness.  Musculoskeletal: Normal range of motion.  Lymphadenopathy:    She has no cervical adenopathy.  Neurological: She is alert and oriented to person, place, and time.  Skin: Skin is warm and dry. No rash noted.  Psychiatric: She has a normal mood and affect. Her behavior is normal.          Assessment & Plan:    HTN- controlled. Repeat blood pressure 130/76  Dyslipidemia. Continue atorvastatin 80 exercise weight loss encouraged  Diabetes insipidus. CPX 6 months

## 2012-01-14 NOTE — Patient Instructions (Signed)
Limit your sodium (Salt) intake  Please check your blood pressure on a regular basis.  If it is consistently greater than 150/90, please make an office appointment.  You need to lose weight.  Consider a lower calorie diet and regular exercise.  Return in 6 months for follow-up  

## 2012-04-05 ENCOUNTER — Other Ambulatory Visit: Payer: Self-pay | Admitting: Internal Medicine

## 2012-04-27 ENCOUNTER — Other Ambulatory Visit: Payer: Self-pay | Admitting: Internal Medicine

## 2012-04-29 ENCOUNTER — Encounter: Payer: Self-pay | Admitting: Gastroenterology

## 2012-06-09 ENCOUNTER — Other Ambulatory Visit: Payer: Self-pay | Admitting: Dermatology

## 2012-07-18 ENCOUNTER — Other Ambulatory Visit

## 2012-07-21 ENCOUNTER — Other Ambulatory Visit (INDEPENDENT_AMBULATORY_CARE_PROVIDER_SITE_OTHER)

## 2012-07-21 DIAGNOSIS — Z0000 Encounter for general adult medical examination without abnormal findings: Secondary | ICD-10-CM

## 2012-07-21 DIAGNOSIS — I10 Essential (primary) hypertension: Secondary | ICD-10-CM

## 2012-07-21 DIAGNOSIS — E232 Diabetes insipidus: Secondary | ICD-10-CM

## 2012-07-21 DIAGNOSIS — E785 Hyperlipidemia, unspecified: Secondary | ICD-10-CM

## 2012-07-21 DIAGNOSIS — M810 Age-related osteoporosis without current pathological fracture: Secondary | ICD-10-CM

## 2012-07-21 LAB — CBC WITH DIFFERENTIAL/PLATELET
Basophils Relative: 1 % (ref 0.0–3.0)
Eosinophils Absolute: 0.1 10*3/uL (ref 0.0–0.7)
Eosinophils Relative: 1.7 % (ref 0.0–5.0)
HCT: 39.1 % (ref 36.0–46.0)
Lymphs Abs: 1.3 10*3/uL (ref 0.7–4.0)
MCHC: 34.4 g/dL (ref 30.0–36.0)
MCV: 91.2 fl (ref 78.0–100.0)
Monocytes Absolute: 0.5 10*3/uL (ref 0.1–1.0)
Neutrophils Relative %: 61.3 % (ref 43.0–77.0)
RBC: 4.29 Mil/uL (ref 3.87–5.11)

## 2012-07-21 LAB — HEPATIC FUNCTION PANEL
ALT: 30 U/L (ref 0–35)
Bilirubin, Direct: 0.1 mg/dL (ref 0.0–0.3)
Total Bilirubin: 0.8 mg/dL (ref 0.3–1.2)

## 2012-07-21 LAB — LIPID PANEL
LDL Cholesterol: 108 mg/dL — ABNORMAL HIGH (ref 0–99)
Total CHOL/HDL Ratio: 4
Triglycerides: 180 mg/dL — ABNORMAL HIGH (ref 0.0–149.0)

## 2012-07-21 LAB — POCT URINALYSIS DIPSTICK
Bilirubin, UA: NEGATIVE
Blood, UA: NEGATIVE
Glucose, UA: NEGATIVE
Leukocytes, UA: NEGATIVE
Nitrite, UA: NEGATIVE
Urobilinogen, UA: 0.2

## 2012-07-21 LAB — BASIC METABOLIC PANEL
BUN: 8 mg/dL (ref 6–23)
Creatinine, Ser: 0.6 mg/dL (ref 0.4–1.2)
GFR: 116.72 mL/min (ref >60.00)
Potassium: 4.2 mEq/L (ref 3.5–5.1)

## 2012-07-21 LAB — TSH: TSH: 0.8 u[IU]/mL (ref 0.35–5.50)

## 2012-07-25 ENCOUNTER — Encounter: Admitting: Internal Medicine

## 2012-08-19 ENCOUNTER — Encounter: Payer: Self-pay | Admitting: Internal Medicine

## 2012-08-19 ENCOUNTER — Ambulatory Visit (INDEPENDENT_AMBULATORY_CARE_PROVIDER_SITE_OTHER): Admitting: Internal Medicine

## 2012-08-19 VITALS — BP 150/80 | HR 70 | Temp 98.4°F | Resp 20 | Ht 62.0 in | Wt 211.0 lb

## 2012-08-19 DIAGNOSIS — I10 Essential (primary) hypertension: Secondary | ICD-10-CM

## 2012-08-19 DIAGNOSIS — E232 Diabetes insipidus: Secondary | ICD-10-CM

## 2012-08-19 DIAGNOSIS — M810 Age-related osteoporosis without current pathological fracture: Secondary | ICD-10-CM

## 2012-08-19 DIAGNOSIS — Z860100 Personal history of colon polyps, unspecified: Secondary | ICD-10-CM

## 2012-08-19 DIAGNOSIS — L439 Lichen planus, unspecified: Secondary | ICD-10-CM

## 2012-08-19 DIAGNOSIS — R109 Unspecified abdominal pain: Secondary | ICD-10-CM

## 2012-08-19 DIAGNOSIS — Z0000 Encounter for general adult medical examination without abnormal findings: Secondary | ICD-10-CM

## 2012-08-19 DIAGNOSIS — E785 Hyperlipidemia, unspecified: Secondary | ICD-10-CM

## 2012-08-19 NOTE — Progress Notes (Signed)
Patient ID: Chelsea Moreno, female   DOB: Dec 24, 1950, 62 y.o.   MRN: [id number]  Subjective:    Patient ID: Chelsea Moreno, female    DOB: [birth year], 62 y.o.   MRN: [id number]  HPI Wt Readings from Last 3 Encounters:  01/14/12 209 lb (94.802 kg)  11/16/11 207 lb (93.895 kg)  06/28/11 212 lb (96.163 kg)  [id number] 62 -year-old patient who is seen today for an annual physical. Medical problems include treated hypertension and dyslipidemia. She is followed by gynecology. She also has a history colonic polyps her last colonoscopy was about 4  years ago She has a history also of diabetes insipidus.   This is now well controlled with synthetic ADH.  Her depression remained stable  Wt Readings from Last 3 Encounters:  08/19/12 211 lb (95.709 kg)  01/14/12 209 lb (94.802 kg)  11/16/11 207 lb (93.895 kg)    Past Medical History  Diagnosis Date  . COLONIC POLYPS, HX OF 09/24/2008  . DEPRESSION 09/23/2007  . HYPERLIPIDEMIA 09/06/2006  . HYPERTENSION 09/06/2006  . OSTEOPOROSIS 09/24/2008    History   Social History  . Marital Status: Widowed    Spouse Name: N/A    Number of Children: N/A  . Years of Education: N/A   Occupational History  . Not on file.   Social History Main Topics  . Smoking status: Former Smoker    Quit date: 01/09/1968  . Smokeless tobacco: Never Used  . Alcohol Use: No  . Drug Use: No  . Sexually Active: Not on file   Other Topics Concern  . Not on file   Social History Narrative  . No narrative on file    No past surgical history on file.  No family history on file.  No Known Allergies  Current Outpatient Prescriptions on File Prior to Visit  Medication Sig Dispense Refill  . atorvastatin (LIPITOR) 80 MG tablet TAKE ONE TABLET BY MOUTH ONE TIME DAILY  90 tablet  2  . bimatoprost (LUMIGAN) 0.03 % ophthalmic drops 1 drop. As directed       . Cholecalciferol (VITAMIN D) 2000 UNITS CAPS Take by mouth daily.        Marland Kitchen desmopressin (DDAVP) 0.01 % nasal  solution 1 spray nasally twice daily  2.5 mL  12  . escitalopram (LEXAPRO) 10 MG tablet Take 1 tablet (10 mg total) by mouth daily as needed.  90 tablet  0  . lisinopril-hydrochlorothiazide (PRINZIDE,ZESTORETIC) 20-25 MG per tablet Take 1 tablet by mouth daily.  90 tablet  3  . potassium chloride SA (K-DUR,KLOR-CON) 20 MEQ tablet Take 1 tablet (20 mEq total) by mouth 2 (two) times daily.  90 tablet  6  . risedronate (ACTONEL) 150 MG tablet Take 1 tablet (150 mg total) by mouth every 30 (thirty) days. with water on empty stomach, nothing by mouth or lie down for next 30 minutes.  12 tablet  6   No current facility-administered medications on file prior to visit.    There were no vitals taken for this visit.     Review of Systems  Constitutional: Negative for fever, appetite change, fatigue and unexpected weight change.  HENT: Negative for hearing loss, ear pain, nosebleeds, congestion, sore throat, mouth sores, trouble swallowing, neck stiffness, dental problem, voice change, sinus pressure and tinnitus.   Eyes: Negative for photophobia, pain, redness and visual disturbance.  Respiratory: Negative for cough, chest tightness and shortness of breath.   Cardiovascular: Negative for chest pain, palpitations and  leg swelling.  Gastrointestinal: Negative for nausea, vomiting, abdominal pain, diarrhea, constipation, blood in stool, abdominal distention and rectal pain.  Genitourinary: Negative for dysuria, urgency, frequency, hematuria, flank pain, vaginal bleeding, vaginal discharge, difficulty urinating, genital sores, vaginal pain, menstrual problem and pelvic pain.  Musculoskeletal: Negative for back pain and arthralgias.  Skin: Negative for rash.  Neurological: Negative for dizziness, syncope, speech difficulty, weakness, light-headedness, numbness and headaches.  Hematological: Negative for adenopathy. Does not bruise/bleed easily.  Psychiatric/Behavioral: Negative for suicidal ideas,  behavioral problems, self-injury, dysphoric mood and agitation. The patient is not nervous/anxious.        Objective:   Physical Exam  Constitutional: She is oriented to person, place, and time. She appears well-developed and well-nourished.  Obese. Blood pressure low normal  HENT:  Head: Normocephalic and atraumatic.  Right Ear: External ear normal.  Left Ear: External ear normal.  Mouth/Throat: Oropharynx is clear and moist.  Low hanging soft palate with pharyngeal crowding  Eyes: Conjunctivae and EOM are normal.  Neck: Normal range of motion. Neck supple. No JVD present. No thyromegaly present.  Cardiovascular: Normal rate, regular rhythm, normal heart sounds and intact distal pulses.   No murmur heard. Pulmonary/Chest: Effort normal and breath sounds normal. She has no wheezes. She has no rales.  Abdominal: Soft. Bowel sounds are normal. She exhibits no distension and no mass. There is no tenderness. There is no rebound and no guarding.  Genitourinary: Vagina normal.  Musculoskeletal: Normal range of motion. She exhibits no edema and no tenderness.  Neurological: She is alert and oriented to person, place, and time. She has normal reflexes. No cranial nerve deficit. She exhibits normal muscle tone. Coordination normal.  Skin: Skin is warm and dry. No rash noted.  Psychiatric: She has a normal mood and affect. Her behavior is normal.          Assessment & Plan:  Preventive health exam Diabetes insipidus.  Hypertension stable Dyslipidemia continue atorvastatin 80 Obesity/OSA suspect with history of loud snoring and some daytime sleepiness. For the weight loss encouraged. She will consider a sleep study Colonic polyps. Will schedule followup colonoscopy  GYN followup Continue calcium and vitamin D supplements Consider trial off Lexapro Followup colonoscopy

## 2012-08-19 NOTE — Patient Instructions (Signed)
Schedule your colonoscopy to help detect colon cancer.  Gallbladder ultrasound as discussed  You need to lose weight.  Consider a lower calorie diet and regular exercise.Back Exercises Back exercises help treat and prevent back injuries. The goal of back exercises is to increase the strength of your abdominal and back muscles and the flexibility of your back. These exercises should be started when you no longer have back pain. Back exercises include:  Pelvic Tilt. Lie on your back with your knees bent. Tilt your pelvis until the lower part of your back is against the floor. Hold this position 5 to 10 sec and repeat 5 to 10 times.  Knee to Chest. Pull first 1 knee up against your chest and hold for 20 to 30 seconds, repeat this with the other knee, and then both knees. This may be done with the other leg straight or bent, whichever feels better.  Sit-Ups or Curl-Ups. Bend your knees 90 degrees. Start with tilting your pelvis, and do a partial, slow sit-up, lifting your trunk only 30 to 45 degrees off the floor. Take at least 2 to 3 seconds for each sit-up. Do not do sit-ups with your knees out straight. If partial sit-ups are difficult, simply do the above but with only tightening your abdominal muscles and holding it as directed.  Hip-Lift. Lie on your back with your knees flexed 90 degrees. Push down with your feet and shoulders as you raise your hips a couple inches off the floor; hold for 10 seconds, repeat 5 to 10 times.  Back arches. Lie on your stomach, propping yourself up on bent elbows. Slowly press on your hands, causing an arch in your low back. Repeat 3 to 5 times. Any initial stiffness and discomfort should lessen with repetition over time.  Shoulder-Lifts. Lie face down with arms beside your body. Keep hips and torso pressed to floor as you slowly lift your head and shoulders off the floor. Do not overdo your exercises, especially in the beginning. Exercises may cause you some mild  back discomfort which lasts for a few minutes; however, if the pain is more severe, or lasts for more than 15 minutes, do not continue exercises until you see your caregiver. Improvement with exercise therapy for back problems is slow.  See your caregivers for assistance with developing a proper back exercise program. Document Released: 02/02/2004 Document Revised: 03/19/2011 Document Reviewed: 10/26/2010 [facility] Patient Information 2014 [place], [place]. Back Injury Prevention Back injuries can be extremely painful and difficult to heal. After having one back injury, you are much more likely to experience another later on. It is important to learn how to avoid injuring or re-injuring your back. The following tips can help you to prevent a back injury. PHYSICAL FITNESS  Exercise regularly and try to develop good tone in your abdominal muscles. Your abdominal muscles provide a lot of the support needed by your back.  Do aerobic exercises (walking, jogging, biking, swimming) regularly.  Do exercises that increase balance and strength (tai chi, yoga) regularly. This can decrease your risk of falling and injuring your back.  Stretch before and after exercising.  Maintain a healthy weight. The more you weigh, the more stress is placed on your back. For every pound of weight, 10 times that amount of pressure is placed on the back. DIET  Talk to your caregiver about how much calcium and vitamin D you need per day. These nutrients help to prevent weakening of the bones (osteoporosis). Osteoporosis can cause broken (  fractured) bones that lead to back pain.  Include good sources of calcium in your diet, such as dairy products, green, leafy vegetables, and products with calcium added (fortified).  Include good sources of vitamin D in your diet, such as milk and foods that are fortified with vitamin D.  Consider taking a nutritional supplement or a multivitamin if needed.  Stop smoking if you  smoke. POSTURE  Sit and stand up straight. Avoid leaning forward when you sit or hunching over when you stand.  Choose chairs with good low back (lumbar) support.  If you work at a desk, sit close to your work so you do not need to lean over. Keep your chin tucked in. Keep your neck drawn back and elbows bent at a right angle. Your arms should look like the letter "L."  Sit high and close to the steering wheel when you drive. Add a lumbar support to your car seat if needed.  Avoid sitting or standing in one position for too long. Take breaks to get up, stretch, and walk around at least once every hour. Take breaks if you are driving for long periods of time.  Sleep on your side with your knees slightly bent, or sleep on your back with a pillow under your knees. Do not sleep on your stomach. LIFTING, TWISTING, AND REACHING  Avoid heavy lifting, especially repetitive lifting. If you must do heavy lifting:  Stretch before lifting.  Work slowly.  Rest between lifts.  Use carts and dollies to move objects when possible.  Make several small trips instead of carrying 1 heavy load.  Ask for help when you need it.  Ask for help when moving big, awkward objects.  Follow these steps when lifting:  Stand with your feet shoulder-width apart.  Get as close to the object as you can. Do not try to pick up heavy objects that are far from your body.  Use handles or lifting straps if they are available.  Bend at your knees. Squat down, but keep your heels off the floor.  Keep your shoulders pulled back, your chin tucked in, and your back straight.  Lift the object slowly, tightening the muscles in your legs, abdomen, and buttocks. Keep the object as close to the center of your body as possible.  When you put a load down, use these same guidelines in reverse.  Do not:  Lift the object above your waist.  Twist at the waist while lifting or carrying a load. Move your feet if you need to  turn, not your waist.  Bend over without bending at your knees.  Avoid reaching over your head, across a table, or for an object on a high surface. OTHER TIPS  Avoid wet floors and keep sidewalks clear of ice to prevent falls.  Do not sleep on a mattress that is too soft or too hard.  Keep items that are used frequently within easy reach.  Put heavier objects on shelves at waist level and lighter objects on lower or higher shelves.  Find ways to decrease your stress, such as exercise, massage, or relaxation techniques. Stress can build up in your muscles. Tense muscles are more vulnerable to injury.  Seek treatment for depression or anxiety if needed. These conditions can increase your risk of developing back pain. SEEK MEDICAL CARE IF:  You injure your back.  You have questions about diet, exercise, or other ways to prevent back injuries. MAKE SURE YOU:  Understand these instructions.  Will  watch your condition.  Will get help right away if you are not doing well or get worse. Document Released: 02/02/2004 Document Revised: 03/19/2011 Document Reviewed: 02/05/2011 [facility] Patient Information 2014 [place], [place].

## 2012-08-21 ENCOUNTER — Encounter: Admitting: Internal Medicine

## 2012-08-25 ENCOUNTER — Ambulatory Visit
Admission: RE | Admit: 2012-08-25 | Discharge: 2012-08-25 | Disposition: A | Discharge status: Home / Self Care | Source: Ambulatory Visit | Attending: Internal Medicine | Admitting: Internal Medicine

## 2012-08-25 DIAGNOSIS — R109 Unspecified abdominal pain: Secondary | ICD-10-CM

## 2012-08-27 ENCOUNTER — Telehealth: Payer: Self-pay | Admitting: Internal Medicine

## 2012-08-27 NOTE — Telephone Encounter (Signed)
Pt would like results of ultra sound done 8/18.pls call.

## 2012-09-10 ENCOUNTER — Encounter (HOSPITAL_COMMUNITY): Payer: Self-pay

## 2012-09-10 ENCOUNTER — Emergency Department (HOSPITAL_COMMUNITY)

## 2012-09-10 ENCOUNTER — Emergency Department (HOSPITAL_COMMUNITY)
Admission: EM | Admit: 2012-09-10 | Discharge: 2012-09-10 | Disposition: A | Discharge status: Home / Self Care | Attending: Emergency Medicine | Admitting: Emergency Medicine

## 2012-09-10 ENCOUNTER — Telehealth: Payer: Self-pay | Admitting: Internal Medicine

## 2012-09-10 DIAGNOSIS — R55 Syncope and collapse: Secondary | ICD-10-CM

## 2012-09-10 DIAGNOSIS — I10 Essential (primary) hypertension: Secondary | ICD-10-CM | POA: Insufficient documentation

## 2012-09-10 DIAGNOSIS — Z860100 Personal history of colon polyps, unspecified: Secondary | ICD-10-CM | POA: Insufficient documentation

## 2012-09-10 DIAGNOSIS — Z79899 Other long term (current) drug therapy: Secondary | ICD-10-CM | POA: Insufficient documentation

## 2012-09-10 DIAGNOSIS — E785 Hyperlipidemia, unspecified: Secondary | ICD-10-CM | POA: Insufficient documentation

## 2012-09-10 DIAGNOSIS — M810 Age-related osteoporosis without current pathological fracture: Secondary | ICD-10-CM | POA: Insufficient documentation

## 2012-09-10 DIAGNOSIS — Y9289 Other specified places as the place of occurrence of the external cause: Secondary | ICD-10-CM | POA: Insufficient documentation

## 2012-09-10 DIAGNOSIS — W1809XA Striking against other object with subsequent fall, initial encounter: Secondary | ICD-10-CM | POA: Insufficient documentation

## 2012-09-10 DIAGNOSIS — Z87891 Personal history of nicotine dependence: Secondary | ICD-10-CM | POA: Insufficient documentation

## 2012-09-10 DIAGNOSIS — F329 Major depressive disorder, single episode, unspecified: Secondary | ICD-10-CM | POA: Insufficient documentation

## 2012-09-10 DIAGNOSIS — R112 Nausea with vomiting, unspecified: Secondary | ICD-10-CM | POA: Insufficient documentation

## 2012-09-10 DIAGNOSIS — R1110 Vomiting, unspecified: Secondary | ICD-10-CM

## 2012-09-10 DIAGNOSIS — E876 Hypokalemia: Secondary | ICD-10-CM | POA: Insufficient documentation

## 2012-09-10 DIAGNOSIS — S0990XA Unspecified injury of head, initial encounter: Secondary | ICD-10-CM | POA: Insufficient documentation

## 2012-09-10 DIAGNOSIS — F3289 Other specified depressive episodes: Secondary | ICD-10-CM | POA: Insufficient documentation

## 2012-09-10 DIAGNOSIS — R197 Diarrhea, unspecified: Secondary | ICD-10-CM | POA: Insufficient documentation

## 2012-09-10 DIAGNOSIS — Y939 Activity, unspecified: Secondary | ICD-10-CM | POA: Insufficient documentation

## 2012-09-10 LAB — CBC
HCT: 35.3 % — ABNORMAL LOW (ref 36.0–46.0)
MCV: 84.4 fL (ref 78.0–100.0)
Platelets: 274 10*3/uL (ref 150–400)
RBC: 4.18 MIL/uL (ref 3.87–5.11)
WBC: 5.5 10*3/uL (ref 4.0–10.5)

## 2012-09-10 LAB — URINALYSIS, ROUTINE W REFLEX MICROSCOPIC
Bilirubin Urine: NEGATIVE
Hgb urine dipstick: NEGATIVE
Ketones, ur: NEGATIVE mg/dL
Protein, ur: 30 mg/dL — AB
Urobilinogen, UA: 0.2 mg/dL (ref 0.0–1.0)

## 2012-09-10 LAB — COMPREHENSIVE METABOLIC PANEL
AST: 24 U/L (ref 0–37)
CO2: 25 mEq/L (ref 19–32)
Chloride: 90 mEq/L — ABNORMAL LOW (ref 96–112)
Creatinine, Ser: 0.47 mg/dL — ABNORMAL LOW (ref 0.50–1.10)
GFR calc non Af Amer: >90 mL/min (ref >90)
Total Bilirubin: 0.9 mg/dL (ref 0.3–1.2)

## 2012-09-10 LAB — URINE MICROSCOPIC-ADD ON

## 2012-09-10 LAB — GLUCOSE, CAPILLARY: Glucose-Capillary: 124 mg/dL — ABNORMAL HIGH (ref 70–99)

## 2012-09-10 MED ORDER — SODIUM CHLORIDE 0.9 % IV BOLUS (SEPSIS)
1000.0000 mL | Freq: Once | INTRAVENOUS | Status: AC
  Administered 2012-09-10: 1000 mL via INTRAVENOUS

## 2012-09-10 MED ORDER — POTASSIUM CHLORIDE CRYS ER 20 MEQ PO TBCR
40.0000 meq | EXTENDED_RELEASE_TABLET | Freq: Once | ORAL | Status: AC
  Administered 2012-09-10: 40 meq via ORAL
  Filled 2012-09-10: qty 2

## 2012-09-10 NOTE — ED Notes (Signed)
Pt awoke from bed around 2300 with feeling sick to her stomach. Pt with two episodes of emesis and multiple episodes of diarrhea. Pt stood up from toilet, had a syncopal episode, woke up and was tangled in the shower curtain. Tried walking back to bed and fell and additional time. Pt c/o head pain in the back of her head.

## 2012-09-10 NOTE — Telephone Encounter (Signed)
Patient Information:  Caller Name: Bradee  Phone: [phone]  Patient: Chelsea, Moreno  Gender: Female  DOB: Dec 01, 1950  Age: 62 Years  PCP: Eleonore Chiquito (Family Practice > 46yrs old)  Office Follow Up:  Does the office need to follow up with this patient?: No  Instructions For The Office: N/A  RN Note:  Continues to feel weakness this morning, 09/10/12.  Denies scalp swelling or indentation but head feels "sore."  Agreed to go to [facility] ED now.  Symptoms  Reason For Call & Symptoms: Had several episodes of diarrhea followed by a generalized tingling sensation, cold sweat, then fainted approximately 2300 hitting posterior head on bathub wall.  No idea how long she was unconscoius.  After getting up, she fell again face forward across bed with another episode of syncope.  Diarrhea began 09/09/12. No diarrhea so far 09/10/12.  Reviewed Health History In EMR: Yes  Reviewed Medications In EMR: Yes  Reviewed Allergies In EMR: No  Reviewed Surgeries / Procedures: Yes  Date of Onset of Symptoms: 09/09/2012  Treatments Tried: Immodium, Digel  Treatments Tried Worked: No  Guideline(s) Used:  Head Injury  Fainting  Disposition Per Guideline:   Go to ED Now  Reason For Disposition Reached:   Fainted > 15 minutes ago and still feels weak or dizzy  Advice Given:  N/A  Patient Will Follow Care Advice:  YES

## 2012-09-10 NOTE — ED Provider Notes (Signed)
CSN: [id number]     Arrival date & time 09/10/12  1002 History   First MD Initiated Contact with Patient 09/10/12 1108     Chief Complaint  Patient presents with  . Fall  . Loss of Consciousness   (Consider location/radiation/quality/duration/timing/severity/associated sxs/prior Treatment) HPI Pt presents after syncopal event last night.  She states she had onset of nausea, vomiting and profuse watery diarrhea.  While standing up from toilet she had syncopal event - fell hitting the back of her head and upper back on the tub.  She does not know how long she was unconscious.  Today she has had no further vomiting and diarrhea.  States she continues to have headache from hitting her head which is the reason she came to the ED.  States she has had similar episodes in the past- has recently been worked up for gallbladder problems which was negative.  No chest pain, no palpitations, no vertigo.  There are no other associated systemic symptoms, there are no other alleviating or modifying factors.   Past Medical History  Diagnosis Date  . COLONIC POLYPS, HX OF 09/24/2008  . DEPRESSION 09/23/2007  . HYPERLIPIDEMIA 09/06/2006  . HYPERTENSION 09/06/2006  . OSTEOPOROSIS 09/24/2008   History reviewed. No pertinent past surgical history. No family history on file. History  Substance Use Topics  . Smoking status: Former Smoker    Quit date: 01/09/1968  . Smokeless tobacco: Never Used  . Alcohol Use: No   OB History   Grav Para Term Preterm Abortions TAB SAB Ect Mult Living                 Review of Systems ROS reviewed and all otherwise negative except for mentioned in HPI  Allergies  Review of patient's allergies indicates no known allergies.  Home Medications   Current Outpatient Rx  Name  Route  Sig  Dispense  Refill  . atorvastatin (LIPITOR) 80 MG tablet   Oral   Take 80 mg by mouth every evening.         . bimatoprost (LUMIGAN) 0.03 % ophthalmic drops   Both Eyes   Place 1 drop  into both eyes at bedtime.          . Calcium Carbonate-Vitamin D (CALCIUM + D PO)   Oral   Take 2 tablets by mouth daily. 600 mg /800 units         . cholecalciferol (VITAMIN D) 1000 UNITS tablet   Oral   Take 2,000 Units by mouth daily.         . Clobetasol Prop Emollient Base (CLOBETASOL PROPIONATE E) 0.05 % emollient cream   Topical   Apply 1 application topically daily as needed (for area on leg).          Marland Kitchen desmopressin (DDAVP) 0.01 % SOLN   Nasal   Place 1 spray into the nose 2 (two) times daily as needed.         Marland Kitchen escitalopram (LEXAPRO) 10 MG tablet   Oral   Take 10 mg by mouth daily.         Marland Kitchen lisinopril-hydrochlorothiazide (PRINZIDE,ZESTORETIC) 20-25 MG per tablet   Oral   Take 1 tablet by mouth daily.   90 tablet   3   . potassium chloride SA (K-DUR,KLOR-CON) 20 MEQ tablet   Oral   Take 1 tablet (20 mEq total) by mouth 2 (two) times daily.   90 tablet   6   . risedronate (ACTONEL) 150 MG  tablet   Oral   Take 150 mg by mouth every 30 (thirty) days. with water on empty stomach, nothing by mouth or lie down for next 30 minutes.          BP 146/63  Pulse 69  Temp(Src) 98.3 F (36.8 C) (Oral)  Resp 19  Wt 211 lb (95.709 kg)  BMI 38.58 kg/m2  SpO2 98% Vitals reviewed Physical Exam Physical Examination: General appearance - alert, well appearing, and in no distress Mental status - alert, oriented to person, place, and time Head- small area of bruising over right occiput, no significant hematoma/abrasion or laceration Neck- no midline tenderness, FROM without pain Eyes - pupils equal and reactive, extraocular eye movements intact Mouth - mucous membranes moist, pharynx normal without lesions Chest - clear to auscultation, no wheezes, rales or rhonchi, symmetric air entry Heart - normal rate, regular rhythm, normal S1, S2, no murmurs, rubs, clicks or gallops Abdomen - soft, nontender, nondistended, no masses or organomegaly, nabs Back- ttp  over midline thoracic vertebrae also some right thoracic paraspinous tenderness, no lumbar tendneress no CVA tenderness Neurological - alert, oriented x 3, normal speech, strength 5/5 in extremities x 4, sensation intact Extremities - peripheral pulses normal, no pedal edema, no clubbing or cyanosis Skin - normal coloration and turgor, no rashes  ED Course  Procedures (including critical care time)    Date: 09/10/2012  Rate: 82  Rhythm: normal sinus rhythm  QRS Axis: normal  Intervals: normal  ST/T Wave abnormalities: normal  Conduction Disutrbances: none  Narrative Interpretation: poor r wave progression, no prior ekg for comparison     Labs Review Labs Reviewed  CBC - Abnormal; Notable for the following:    HCT 35.3 (*)    MCHC 36.5 (*)    All other components within normal limits  COMPREHENSIVE METABOLIC PANEL - Abnormal; Notable for the following:    Sodium 129 (*)    Potassium 3.2 (*)    Chloride 90 (*)    Glucose, Bld 117 (*)    Creatinine, Ser 0.47 (*)    All other components within normal limits  URINALYSIS, ROUTINE W REFLEX MICROSCOPIC - Abnormal; Notable for the following:    APPearance HAZY (*)    Protein, ur 30 (*)    Leukocytes, UA SMALL (*)    All other components within normal limits  GLUCOSE, CAPILLARY - Abnormal; Notable for the following:    Glucose-Capillary 124 (*)    All other components within normal limits  URINE MICROSCOPIC-ADD ON - Abnormal; Notable for the following:    Squamous Epithelial / LPF FEW (*)    Bacteria, UA FEW (*)    Casts HYALINE CASTS (*)    All other components within normal limits  URINE CULTURE   Imaging Review Dg Thoracic Spine 2 View  09/10/2012   CLINICAL DATA:  62 year old female with pain. Fall and loss of consciousness.  EXAM: THORACIC SPINE - 2 VIEW  COMPARISON:  None.  FINDINGS: Normal thoracic segmentation. Thoracic vertebral height and alignment within normal limits. Multilevel mid and lower thoracic mild vertebral  body endplate spurring. Cervicothoracic junction alignment is within normal limits. Vascular calcifications including of the visible abdominal aorta. Grossly negative visualized thoracic visceral contours.  IMPRESSION: No acute fracture or listhesis identified in the thoracic spine.   Electronically Signed   By: Augusto Gamble   On: 09/10/2012 12:57   Ct Head Wo Contrast  09/10/2012   *RADIOLOGY REPORT*  Clinical Data: Loss of consciousness post  trauma  CT HEAD WITHOUT CONTRAST  Technique:  Contiguous axial images were obtained from the base of the skull through the vertex without contrast.  Comparison: None.  Findings: The ventricles are normal in size and configuration.  There is a small focus of increased attenuation in the lower right pons laterally, best seen on slice nine.  This finding may be artifactual.  However, a small focus of hemorrhage cannot be entirely excluded in this area.  This focus measures 6 mm in size.  There is no other evidence suggesting potential hemorrhage.  There is no extra-axial fluid collection or midline shift.  Elsewhere gray-white compartments are normal.  Bony calvarium appears intact.  Mastoid air cells are clear.  There is a minimal right occipital scalp hematoma.  IMPRESSION: Small area of increased attenuation in the lateral lower right pons.  While this finding could represent artifact, a small focus of hemorrhage cannot be entirely excluded in this area. This finding may warrant correlation with MR to further assess. Study is otherwise unremarkable except for a small right occipital scalp hematoma.  Comment:  This report was communicated directly by phone to Dr. Radford Pax, emergency department physician, immediately upon review of this study.   Original Report Authenticated By: Bretta Bang, M.D.   Mr Brain Wo Contrast  09/10/2012   *RADIOLOGY REPORT*  Clinical Data: Fall.  Loss of consciousness.  MRI HEAD WITHOUT CONTRAST  Technique:  Multiplanar, multiecho pulse sequences  of the brain and surrounding structures were obtained according to standard protocol without intravenous contrast.  Comparison: CT head without contrast from the same day.  Findings: No discrete pontine lesion is evident.  This was likely artifactual on the CT scan.  No acute infarct, hemorrhage, or mass lesion is present.  Scattered periventricular and subcortical T2 hyperintensities are present bilaterally.  The ventricles are of normal size.  No significant extra-axial fluid collection is present.  Flow is present in the major intracranial arteries.  The globes and orbits are intact.  The paranasal sinuses are clear. There is some fluid in the mastoid air cells bilaterally. No obstructing nasopharyngeal lesion is evident.  IMPRESSION:  1.  No acute intracranial abnormality or focal lesion to account for the CT finding.  The CT finding is likely artifactual. 2.  Scattered white matter disease is slightly advanced for age. 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 3.  Minimal mastoid fluid.  No obstructing nasopharyngeal lesion is evident.   Original Report Authenticated By: Marin Roberts, M.D.    MDM   1. Syncope   2. Minor head injury, initial encounter   3. Vomiting and diarrhea   4. Hypokalemia    Pt presenting with vomiting/diarrhea asssociated with syncopal event- she also hit the back of her head.  Workup is reassuring including ekg, labs, orthostatic vitals.  Mild hypokalemia repleted.  Head CT obtained and could not rule out bleed versus artifact.  MRI brain obtained and shows no bleed- CT finding determined to be artifactual.  I have discussed these results with patient.  She is going to have close f/u with her PMD office.  Discharged with strict return precautions.  Pt agreeable with plan.    Ethelda Chick, MD 09/10/12 [id number]

## 2012-09-10 NOTE — Telephone Encounter (Signed)
ED Notification 

## 2012-09-10 NOTE — ED Notes (Signed)
Patient transported to MRI 

## 2012-09-11 LAB — URINE CULTURE: Colony Count: NO GROWTH

## 2012-09-15 ENCOUNTER — Ambulatory Visit (INDEPENDENT_AMBULATORY_CARE_PROVIDER_SITE_OTHER): Admitting: Internal Medicine

## 2012-09-15 ENCOUNTER — Encounter: Payer: Self-pay | Admitting: Internal Medicine

## 2012-09-15 VITALS — BP 134/80 | HR 72 | Temp 98.3°F | Resp 20 | Wt 206.0 lb

## 2012-09-15 DIAGNOSIS — Z860100 Personal history of colon polyps, unspecified: Secondary | ICD-10-CM

## 2012-09-15 DIAGNOSIS — I10 Essential (primary) hypertension: Secondary | ICD-10-CM

## 2012-09-15 DIAGNOSIS — E232 Diabetes insipidus: Secondary | ICD-10-CM

## 2012-09-15 MED ORDER — LISINOPRIL 20 MG PO TABS: 20.0000 mg | ORAL_TABLET | Freq: Every day | ORAL | Status: DC

## 2012-09-15 NOTE — Progress Notes (Signed)
Subjective:    Patient ID: Chelsea Moreno, female    DOB: 05-02-1950, 62 y.o.   MRN: [id number]  HPI  62 year old patient who has a history of treated hypertension. Last week she developed severe nausea vomiting and diarrhea and experienced a syncopal episode when she stood from a sitting position. She was seen in the ED 5 days ago for syncope. The patient complains of episodic abdominal pain diarrhea and nausea and is scheduled for GI evaluation Blood pressure regimen includes diuretic therapy which will be discontinued today;  she has a history of colonic polyps and is scheduled for followup colonoscopy  Past Medical History  Diagnosis Date  . COLONIC POLYPS, HX OF 09/24/2008  . DEPRESSION 09/23/2007  . HYPERLIPIDEMIA 09/06/2006  . HYPERTENSION 09/06/2006  . OSTEOPOROSIS 09/24/2008    History   Social History  . Marital Status: Widowed    Spouse Name: N/A    Number of Children: N/A  . Years of Education: N/A   Occupational History  . Not on file.   Social History Main Topics  . Smoking status: Former Smoker    Quit date: 01/09/1968  . Smokeless tobacco: Never Used  . Alcohol Use: No  . Drug Use: No  . Sexual Activity: Not on file   Other Topics Concern  . Not on file   Social History Narrative  . No narrative on file    History reviewed. No pertinent past surgical history.  No family history on file.  No Known Allergies  Current Outpatient Prescriptions on File Prior to Visit  Medication Sig Dispense Refill  . atorvastatin (LIPITOR) 80 MG tablet Take 80 mg by mouth every evening.      . bimatoprost (LUMIGAN) 0.03 % ophthalmic drops Place 1 drop into both eyes at bedtime.       . Calcium Carbonate-Vitamin D (CALCIUM + D PO) Take 2 tablets by mouth daily. 600 mg /800 units      . cholecalciferol (VITAMIN D) 1000 UNITS tablet Take 2,000 Units by mouth daily.      . Clobetasol Prop Emollient Base (CLOBETASOL PROPIONATE E) 0.05 % emollient cream Apply 1 application  topically daily as needed (for area on leg).       Marland Kitchen desmopressin (DDAVP) 0.01 % SOLN Place 1 spray into the nose 2 (two) times daily as needed.      Marland Kitchen escitalopram (LEXAPRO) 10 MG tablet Take 10 mg by mouth daily.      . potassium chloride SA (K-DUR,KLOR-CON) 20 MEQ tablet Take 1 tablet (20 mEq total) by mouth 2 (two) times daily.  90 tablet  6  . risedronate (ACTONEL) 150 MG tablet Take 150 mg by mouth every 30 (thirty) days. with water on empty stomach, nothing by mouth or lie down for next 30 minutes.       No current facility-administered medications on file prior to visit.    BP 134/80  Pulse 72  Temp(Src) 98.3 F (36.8 C) (Oral)  Resp 20  Wt 206 lb (93.441 kg)  BMI 37.67 kg/m2  SpO2 99%     Review of Systems  Constitutional: Negative.   HENT: Negative for hearing loss, congestion, sore throat, rhinorrhea, dental problem, sinus pressure and tinnitus.   Eyes: Negative for pain, discharge and visual disturbance.  Respiratory: Negative for cough and shortness of breath.   Cardiovascular: Negative for chest pain, palpitations and leg swelling.  Gastrointestinal: Positive for nausea, abdominal pain and diarrhea. Negative for vomiting, constipation, blood in stool and  abdominal distention.  Genitourinary: Negative for dysuria, urgency, frequency, hematuria, flank pain, vaginal bleeding, vaginal discharge, difficulty urinating, vaginal pain and pelvic pain.  Musculoskeletal: Negative for joint swelling, arthralgias and gait problem.  Skin: Negative for rash.  Neurological: Negative for dizziness, syncope, speech difficulty, weakness, numbness and headaches.  Hematological: Negative for adenopathy.  Psychiatric/Behavioral: Negative for behavioral problems, dysphoric mood and agitation. The patient is not nervous/anxious.        Objective:   Physical Exam  Constitutional: She is oriented to person, place, and time. She appears well-developed and well-nourished.  Blood pressure  130/80 without orthostatic changes  HENT:  Head: Normocephalic.  Right Ear: External ear normal.  Left Ear: External ear normal.  Mouth/Throat: Oropharynx is clear and moist.  Eyes: Conjunctivae and EOM are normal. Pupils are equal, round, and reactive to light.  Neck: Normal range of motion. Neck supple. No thyromegaly present.  Cardiovascular: Normal rate, regular rhythm, normal heart sounds and intact distal pulses.   Pulmonary/Chest: Effort normal and breath sounds normal.  Abdominal: Soft. Bowel sounds are normal. She exhibits no mass. There is no tenderness.  Musculoskeletal: Normal range of motion.  Lymphadenopathy:    She has no cervical adenopathy.  Neurological: She is alert and oriented to person, place, and time.  Skin: Skin is warm and dry. No rash noted.  Psychiatric: She has a normal mood and affect. Her behavior is normal.          Assessment & Plan:   History of syncope in the setting of volume  Depletion Episodic abdominal pain diarrhea and nausea. GI consultation as planned Hypertension. We'll continue lisinopril and discontinue hydrochlorothiazide and observe Diabetes insipidus. Well controlled on therapy

## 2012-09-15 NOTE — Patient Instructions (Signed)
GI followup as discussed

## 2012-09-20 ENCOUNTER — Other Ambulatory Visit: Payer: Self-pay | Admitting: Internal Medicine

## 2012-11-03 ENCOUNTER — Other Ambulatory Visit: Payer: Self-pay | Admitting: Obstetrics & Gynecology

## 2012-11-05 ENCOUNTER — Other Ambulatory Visit: Payer: Self-pay | Admitting: Radiology

## 2012-11-05 DIAGNOSIS — C50919 Malignant neoplasm of unspecified site of unspecified female breast: Secondary | ICD-10-CM

## 2012-11-05 HISTORY — DX: Malignant neoplasm of unspecified site of unspecified female breast: C50.919

## 2012-11-06 ENCOUNTER — Other Ambulatory Visit: Payer: Self-pay | Admitting: Radiology

## 2012-11-06 DIAGNOSIS — D0510 Intraductal carcinoma in situ of unspecified breast: Secondary | ICD-10-CM

## 2012-11-07 ENCOUNTER — Telehealth: Payer: Self-pay | Admitting: *Deleted

## 2012-11-07 DIAGNOSIS — C50312 Malignant neoplasm of lower-inner quadrant of left female breast: Secondary | ICD-10-CM | POA: Insufficient documentation

## 2012-11-07 NOTE — Telephone Encounter (Signed)
Confirmed [facility] for 11/12/12 at 1230 .  Instructions and contact information given.

## 2012-11-12 ENCOUNTER — Encounter (INDEPENDENT_AMBULATORY_CARE_PROVIDER_SITE_OTHER): Payer: Self-pay

## 2012-11-12 ENCOUNTER — Encounter: Payer: Self-pay | Admitting: Oncology

## 2012-11-12 ENCOUNTER — Ambulatory Visit

## 2012-11-12 ENCOUNTER — Ambulatory Visit (HOSPITAL_BASED_OUTPATIENT_CLINIC_OR_DEPARTMENT_OTHER): Admitting: Oncology

## 2012-11-12 ENCOUNTER — Encounter: Payer: Self-pay | Admitting: *Deleted

## 2012-11-12 ENCOUNTER — Other Ambulatory Visit (HOSPITAL_BASED_OUTPATIENT_CLINIC_OR_DEPARTMENT_OTHER): Admitting: Lab

## 2012-11-12 ENCOUNTER — Other Ambulatory Visit (INDEPENDENT_AMBULATORY_CARE_PROVIDER_SITE_OTHER): Payer: Self-pay | Admitting: General Surgery

## 2012-11-12 ENCOUNTER — Ambulatory Visit
Admission: RE | Admit: 2012-11-12 | Discharge: 2012-11-12 | Disposition: A | Discharge status: Home / Self Care | Source: Ambulatory Visit | Attending: Radiation Oncology | Admitting: Radiation Oncology

## 2012-11-12 ENCOUNTER — Ambulatory Visit: Admitting: Physical Therapy

## 2012-11-12 ENCOUNTER — Ambulatory Visit (HOSPITAL_BASED_OUTPATIENT_CLINIC_OR_DEPARTMENT_OTHER): Admitting: General Surgery

## 2012-11-12 VITALS — BP 137/85 | HR 72 | Temp 97.9°F | Resp 18 | Ht 62.0 in | Wt 209.7 lb

## 2012-11-12 DIAGNOSIS — C50312 Malignant neoplasm of lower-inner quadrant of left female breast: Secondary | ICD-10-CM

## 2012-11-12 DIAGNOSIS — C50319 Malignant neoplasm of lower-inner quadrant of unspecified female breast: Secondary | ICD-10-CM

## 2012-11-12 LAB — CBC WITH DIFFERENTIAL/PLATELET
Basophils Absolute: 0.1 10*3/uL (ref 0.0–0.1)
EOS%: 2.2 % (ref 0.0–7.0)
Eosinophils Absolute: 0.1 10*3/uL (ref 0.0–0.5)
HCT: 36.4 % (ref 34.8–46.6)
HGB: 12.6 g/dL (ref 11.6–15.9)
MCH: 30.8 pg (ref 25.1–34.0)
MCV: 89 fL (ref 79.5–101.0)
MONO%: 7.9 % (ref 0.0–14.0)
NEUT%: 59.7 % (ref 38.4–76.8)
Platelets: 268 10*3/uL (ref 145–400)

## 2012-11-12 LAB — COMPREHENSIVE METABOLIC PANEL (CC13)
AST: 24 U/L (ref 5–34)
Alkaline Phosphatase: 85 U/L (ref 40–150)
BUN: 9.9 mg/dL (ref 7.0–26.0)
Calcium: 10 mg/dL (ref 8.4–10.4)
Chloride: 95 mEq/L — ABNORMAL LOW (ref 98–109)
Creatinine: 0.7 mg/dL (ref 0.6–1.1)
Glucose: 103 mg/dl (ref 70–140)

## 2012-11-12 NOTE — Assessment & Plan Note (Signed)
Patient appears to have a 2 cm area of DCIS in the left breast. This is pending her MRI.  As long as nothing changes, we will plan to do a needle localized lumpectomy. 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  I counseled her regarding the risk of surgery.  The surgical procedure was described to the patient.  I discussed the incision type and location and whether we would need radiology involved on the day of surgery with a wire marker and/or sentinel node.      The risks and benefits of the procedure were described to the patient and he/she wishes to proceed.    We discussed the risks bleeding, infection, damage to other structures, need for further procedures/surgeries.  We discussed the risk of seroma.  The patient was advised if the area in the breast has invasive cancer, we may need to go back to surgery for additional tissue to obtain negative margins or for a lymph node biopsy. The patient was advised that these are the most common complications, but that others can occur as well.  They were advised against taking aspirin or other anti-inflammatory agents/blood thinners the week before surgery.    40 min spent in counseling, evaluation, examination, and coordination of care.  >50% spent in counseling.

## 2012-11-12 NOTE — Progress Notes (Unsigned)
Checked in new patient with no financial issues. She has appt card and [facility] form. She has not been to [place].

## 2012-11-12 NOTE — Progress Notes (Signed)
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 female who is seen today at the [facility] through the courtesy of Dr. Donell Beers for consideration of radiation therapy in the management of her DCIS of the left breast. At the time of a screening mammogram on 10/27/2012 she was noted to have heterogeneous calcifications within the left breast. Additional views showed calcifications at approximately 6:00, and she underwent a stereotactic biopsy on 11/05/2012 with a diagnosis of DCIS with calcifications and necrosis. The DCIS was ER positive and felt to be intermediate grade. Breast MR is scheduled for this Friday, November 7. PREVIOUS RADIATION THERAPY: No   PAST MEDICAL HISTORY:  has a past medical history of COLONIC POLYPS, HX OF (09/24/2008); DEPRESSION (09/23/2007); HYPERLIPIDEMIA (09/06/2006); HYPERTENSION (09/06/2006); OSTEOPOROSIS (09/24/2008); and Breast cancer.     PAST SURGICAL HISTORY: No past surgical history on file.   FAMILY HISTORY: family history includes Ovarian cancer in her mother. No family history of breast cancer.   SOCIAL HISTORY:  reports that she quit smoking about 44 years ago. She has never used smokeless tobacco. She reports that she does not drink alcohol or use illicit drugs. Widowed, one son age 44. 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   ALLERGIES: Review of patient's allergies indicates no known allergies.   MEDICATIONS:  Current Outpatient Prescriptions  Medication Sig Dispense Refill  . atorvastatin (LIPITOR) 80 MG tablet Take 80 mg by mouth every evening.      . bimatoprost (LUMIGAN) 0.03 % ophthalmic drops Place 1 drop into both eyes  at bedtime.       . Calcium Carbonate-Vitamin D (CALCIUM + D PO) Take 2 tablets by mouth daily. 600 mg /800 units      . cholecalciferol (VITAMIN D) 1000 UNITS tablet Take 2,000 Units by mouth daily.      . Clobetasol Prop Emollient Base (CLOBETASOL PROPIONATE E) 0.05 % emollient cream Apply 1 application topically daily as needed (for area on leg).       Marland Kitchen desmopressin (DDAVP) 0.01 % SOLN Place 1 spray into the nose 2 (two) times daily as needed.      Marland Kitchen escitalopram (LEXAPRO) 10 MG tablet TAKE ONE TABLET BY MOUTH ONE TIME DAILY   90 tablet  1  . lisinopril (PRINIVIL,ZESTRIL) 20 MG tablet Take 1 tablet (20 mg total) by mouth daily.  90 tablet  3  . potassium chloride SA (K-DUR,KLOR-CON) 20 MEQ tablet Take 1 tablet (20 mEq total) by mouth 2 (two) times daily.  90 tablet  6  . risedronate (ACTONEL) 150 MG tablet Take 150 mg by mouth every 30 (thirty) days. with water on empty stomach, nothing by mouth or lie down for next 30 minutes.       No current facility-administered medications for this encounter.     REVIEW OF SYSTEMS:  Pertinent items are noted in HPI.    PHYSICAL EXAM: Alert and oriented 62 year old female appearing her stated age. Wt Readings from Last 3 Encounters:  11/12/12 209 lb 11.2 oz (95.119 kg)  09/15/12 206 lb (93.441 kg)  09/10/12 211  lb (95.709 kg)   Temp Readings from Last 3 Encounters:  11/12/12 97.9 F (36.6 C) Oral  09/15/12 98.3 F (36.8 C) Oral  09/10/12 98.6 F (37 C) Oral   BP Readings from Last 3 Encounters:  11/12/12 137/85  09/15/12 134/80  09/10/12 147/71   Pulse Readings from Last 3 Encounters:  11/12/12 72  09/15/12 72  09/10/12 68   Head and neck examination: Grossly unremarkable. Nodes: Without palpable cervical, supraclavicular, or axillary lymphadenopathy. Chest: Lungs clear. Back: Without spinal or CVA tenderness. Breasts: There is a punctate biopsy wound and approximate 7:00 along the lower inner quadrant of the left breast. No masses  are appreciated. Right breast without masses or lesions. Abdomen without hepatomegaly. Extremities: Without edema.    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 We discussed local treatment options which include mastectomy versus partial mastectomy followed by radiation therapy. Based on her breast size, I recommend standard fractionation treatment over a period of 5 weeks. We discussed the potential acute and late toxicities of radiation therapy. We discussed the B. 43 protocol which may be an option if it is still open. Her prognosis is favorable. Lastly, I will probably obtain a preradiation therapy mammogram to confirm removal of all suspicious microcalcifications.   PLAN: As discussed above.  I spent 30 minutes minutes face to face with the patient and more than 50% of that time was spent in counseling and/or coordination of care.

## 2012-11-12 NOTE — Addendum Note (Signed)
Addended by: Almond Lint on: 11/12/2012 04:24 PM   Modules accepted: Orders

## 2012-11-12 NOTE — Progress Notes (Signed)
Chief complaint:  New left breast cancer  HISTORY: Patient is a 62 year old female who presented with screening detected calcifications. She is referred by Dr. Amador Cunas for consultation regarding her new cancer. After the calcifications were seen, and she underwent diagnostic mammogram and ultrasound.  Biopsy was positive for grade 2 DCIS that was ER positive and PR negative. She has not had her MRI. 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 Her mother had ovarian cancer. She has no personal history of cancer. She has one child. Her first menstrual period was at age 54 and she ceased having periods around 12 years ago. She denies hormone replacement. She did use oral contraception for approximately a month. She had one child at age 53. She's up-to-date on her colonoscopy, bone density, and Pap smear.  Past Medical History  Diagnosis Date  . COLONIC POLYPS, HX OF 09/24/2008  . DEPRESSION 09/23/2007  . HYPERLIPIDEMIA 09/06/2006  . HYPERTENSION 09/06/2006  . OSTEOPOROSIS 09/24/2008  . Breast cancer     Past surgical history:   Left breast biopsy.  Current Outpatient Prescriptions  Medication Sig Dispense Refill  . atorvastatin (LIPITOR) 80 MG tablet Take 80 mg by mouth every evening.      . bimatoprost (LUMIGAN) 0.03 % ophthalmic drops Place 1 drop into both eyes at bedtime.       . Calcium Carbonate-Vitamin D (CALCIUM + D PO) Take 2 tablets by mouth daily. 600 mg /800 units      . cholecalciferol (VITAMIN D) 1000 UNITS tablet Take 2,000 Units by mouth daily.      . Clobetasol Prop Emollient Base (CLOBETASOL PROPIONATE E) 0.05 % emollient cream Apply 1 application topically daily as needed (for area on leg).       Marland Kitchen desmopressin (DDAVP) 0.01 % SOLN Place 1 spray into the nose 2 (two) times daily as needed.      Marland Kitchen escitalopram (LEXAPRO) 10 MG tablet TAKE ONE TABLET BY MOUTH ONE TIME DAILY   90 tablet  1  . lisinopril (PRINIVIL,ZESTRIL) 20 MG  tablet Take 1 tablet (20 mg total) by mouth daily.  90 tablet  3  . potassium chloride SA (K-DUR,KLOR-CON) 20 MEQ tablet Take 1 tablet (20 mEq total) by mouth 2 (two) times daily.  90 tablet  6  . risedronate (ACTONEL) 150 MG tablet Take 150 mg by mouth every 30 (thirty) days. with water on empty stomach, nothing by mouth or lie down for next 30 minutes.       No current facility-administered medications for this visit.     No Known Allergies   Family History  Problem Relation Age of Onset  . Ovarian cancer Mother      History   Social History  . Marital Status: Widowed    Spouse Name: N/A    Number of Children: N/A  . Years of Education: N/A   Social History Main Topics  . Smoking status: Former Smoker    Quit date: 01/09/1968  . Smokeless tobacco: Never Used  . Alcohol Use: No  . Drug Use: No  . Sexual Activity: Not on file   Other Topics Concern  . Not on file   Social History Narrative  . No narrative on file     REVIEW OF SYSTEMS - PERTINENT POSITIVES ONLY: 12 point review of systems negative other than HPI and PMH except for nausea/vomiting, easy bruising, joint pain, history of fainting.    EXAM: Wt Readings from Last 3  Encounters:  11/12/12 209 lb 11.2 oz (95.119 kg)  09/15/12 206 lb (93.441 kg)  09/10/12 211 lb (95.709 kg)   Temp Readings from Last 3 Encounters:  11/12/12 97.9 F (36.6 C) Oral  09/15/12 98.3 F (36.8 C) Oral  09/10/12 98.6 F (37 C) Oral   BP Readings from Last 3 Encounters:  11/12/12 137/85  09/15/12 134/80  09/10/12 147/71   Pulse Readings from Last 3 Encounters:  11/12/12 72  09/15/12 72  09/10/12 68    Gen:  No acute distress.  Well nourished and well groomed.   Neurological: Alert and oriented to person, place, and time. Coordination normal.  Head: Normocephalic and atraumatic.  Eyes: Conjunctivae are normal. Pupils are equal, round, and reactive to light. No scleral icterus.  Neck: Normal range of motion. Neck  supple. No tracheal deviation or thyromegaly present.  Cardiovascular: Normal rate, regular rhythm, normal heart sounds and intact distal pulses.  Exam reveals no gallop and no friction rub.  No murmur heard. Respiratory: Effort normal.  No respiratory distress. No chest wall tenderness. Breath sounds normal.  No wheezes, rales or rhonchi.  Breast:  Breasts are ptotic bilaterally, no palpable masses.  Some bruising at biopsy site.  No palpable adenopathy, nipple retraction, nipple discharge.   GI: Soft. Bowel sounds are normal. The abdomen is soft and nontender.  There is no rebound and no guarding.  Musculoskeletal: Normal range of motion. Extremities are nontender.  Lymphadenopathy: No cervical, preauricular, postauricular or axillary adenopathy is present Skin: Skin is warm and dry. No rash noted. No diaphoresis. No erythema. No pallor. No clubbing, cyanosis, or edema.   Psychiatric: Normal mood and affect. Behavior is normal. Judgment and thought content normal.    LABORATORY RESULTS: Available labs are reviewed  Pathology Diagnosis Breast, left, needle core biopsy - DUCTAL CARCINOMA IN SITU WITHIN CALCIFICATIONS AND NECROSIS, SEE COMMENT. ER/PR 100% positive.    RADIOLOGY RESULTS: See E-Chart or I-Site for most recent results.  Images and reports are reviewed. mammogram Heterogeneous calcifications at 6 o'clock   ASSESSMENT AND PLAN: Cancer of lower-inner quadrant of female breast Patient appears to have a 2 cm area of DCIS in the left breast. This is pending her MRI.  As long as nothing changes, we will plan to do a needle localized lumpectomy. 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  I counseled her regarding the risk of surgery.  The surgical procedure was described to the patient.  I discussed the incision type and location and whether we would need radiology involved on the day of  surgery with a wire marker and/or sentinel node.      The risks and benefits of the procedure were described to the patient and he/she wishes to proceed.    We discussed the risks bleeding, infection, damage to other structures, need for further procedures/surgeries.  We discussed the risk of seroma.  The patient was advised if the area in the breast has invasive cancer, we may need to go back to surgery for additional tissue to obtain negative margins or for a lymph node biopsy. The patient was advised that these are the most common complications, but that others can occur as well.  They were advised against taking aspirin or other anti-inflammatory agents/blood thinners the week before surgery.    40 min spent in counseling, evaluation, examination, and coordination of care.  >50% spent  in counseling.        Maudry Diego MD Surgical Oncology, General and Endocrine Surgery [facility]      Visit Diagnoses: 1. Cancer of lower-inner quadrant of female breast, left     Primary Care Physician: Rogelia Boga, MD  Dr. Dayton Scrape, radiation oncology Dr. Welton Flakes, oncology Marianne Sofia, RN

## 2012-11-13 ENCOUNTER — Encounter: Payer: Self-pay | Admitting: Oncology

## 2012-11-13 ENCOUNTER — Encounter: Payer: Self-pay | Admitting: Specialist

## 2012-11-13 NOTE — Progress Notes (Signed)
I saw Chelsea Moreno at breast clinic and gave her information on available support services.  She rated her distress initially as "7" but said it was a "5" after meeting with the physicians.  I encouraged her to call for support services if she needs assistance of any kind.

## 2012-11-13 NOTE — Progress Notes (Signed)
Chelsea Moreno [id number] 1950-01-27 62 y.o. 11/13/2012 5:28 PM  CC  Rogelia Boga, MD [address] [place] [address]  Dr. Chipper Herb Dr. Everardo Beals REASON FOR CONSULTATION:  62 year old female with new diagnosis of left breast cancer. Patient is seen in the multidisciplinary breast clinic for discussion of treatment options  STAGE:   Cancer of lower-inner quadrant of female breast   Primary site: Breast (Left)   Staging method: AJCC 7th Edition   Clinical: Stage 0 (Tis, N0, cM0)   Summary: Stage 0 (Tis, N0, cM0)  REFERRING PHYSICIAN: Dr. Everardo Beals  HISTORY OF PRESENT ILLNESS:  Chelsea Moreno is a 62 y.o. female.  Who was recall from a screening mammogram that showed calcifications in the left breast measuring 2.0 cm at the 6:00 position. Patient underwent a stereotactic biopsy. 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 Tumor was ER positive PR positive. She is scheduled to have an MRI of the breasts on Friday. She has no family history of breast cancer. She has no prior personal history of malignancies. Patient's case was discussed at the multidisciplinary breast conference. Her radiology and pathology were reviewed. She is now seen in the [facility] clinic for treatment options. She is without any complaints related to her breast cancer.   Past Medical History: Past Medical History  Diagnosis Date  . COLONIC POLYPS, HX OF 09/24/2008  . DEPRESSION 09/23/2007  . HYPERLIPIDEMIA 09/06/2006  . HYPERTENSION 09/06/2006  . OSTEOPOROSIS 09/24/2008  . Breast cancer     Past Surgical History: History reviewed. No pertinent past surgical history.  Family History: Family History  Problem Relation Age of Onset  . Ovarian cancer Mother     Social History History  Substance Use Topics  . Smoking status: Former Smoker    Quit date: 01/09/1968  . Smokeless tobacco: Never Used  . Alcohol Use: No     Allergies: No Known Allergies  Current Medications: Current Outpatient Prescriptions  Medication Sig Dispense Refill  . atorvastatin (LIPITOR) 80 MG tablet Take 80 mg by mouth every evening.      . bimatoprost (LUMIGAN) 0.03 % ophthalmic drops Place 1 drop into both eyes at bedtime.       . Calcium Carbonate-Vitamin D (CALCIUM + D PO) Take 2 tablets by mouth daily. 600 mg /800 units      . cholecalciferol (VITAMIN D) 1000 UNITS tablet Take 2,000 Units by mouth daily.      . Clobetasol Prop Emollient Base (CLOBETASOL PROPIONATE E) 0.05 % emollient cream Apply 1 application topically daily as needed (for area on leg).       Marland Kitchen desmopressin (DDAVP) 0.01 % SOLN Place 1 spray into the nose 2 (two) times daily as needed.      Marland Kitchen lisinopril (PRINIVIL,ZESTRIL) 20 MG tablet Take 1 tablet (20 mg total) by mouth daily.  90 tablet  3  . potassium chloride SA (K-DUR,KLOR-CON) 20 MEQ tablet Take 1 tablet (20 mEq total) by mouth 2 (two) times daily.  90 tablet  6  . risedronate (ACTONEL) 150 MG tablet Take 150 mg by mouth every 30 (thirty) days. with water on empty stomach, nothing by mouth or lie down for next 30 minutes.      Marland Kitchen escitalopram (LEXAPRO) 10 MG tablet TAKE ONE TABLET BY MOUTH ONE TIME DAILY   90 tablet  1   No current facility-administered medications for this visit.    OB/GYN History: Patient had menarche  at age 30 she is postmenopausal undergoing menopause in 2002. She has not been on replacement therapy. First live birth was at the age of 67. She took birth control pills for about 12 months.  Fertility Discussion: Not applicable Prior History of Cancer: No  Health Maintenance:  Colonoscopy yes 2009 Bone Density yes 2014 Last PAP smear October 2014  ECOG PERFORMANCE STATUS: 0 - Asymptomatic  Genetic Counseling/testing: no  REVIEW OF SYSTEMS:  Patient does have complaints of nausea and vomiting off-and-on she does develop some rashes and has easy bruising and fainting spells  plus has joint pains with arthritis. Otherwise remainder of the 14 point review of systems was negative and scan separately into the electronic medical record  PHYSICAL EXAMINATION: Blood pressure 137/85, pulse 72, temperature 97.9 F (36.6 C), temperature source Oral, resp. rate 18, height 5\' 2"  (1.575 m), weight 209 lb 11.2 oz (95.119 kg).  BJY:NWGNF, healthy, no distress, well nourished and well developed SKIN: skin color, texture, turgor are normal HEAD: Normocephalic EYES: PERRLA, EOMI, Conjunctiva are pink and non-injected EARS: External ears normal OROPHARYNX:no exudate, no erythema and lips, buccal mucosa, and tongue normal  NECK: no adenopathy LYMPH:  no palpable lymphadenopathy, no hepatosplenomegaly BREAST:right breast normal without mass, skin or nipple changes or axillary nodes, abnormal mass palpable area of ecchymosis in the left breast but no other masses LUNGS: clear to auscultation and percussion HEART: regular rate & rhythm ABDOMEN:abdomen soft, non-tender, normal bowel sounds and no masses or organomegaly BACK: Back symmetric, no curvature. EXTREMITIES:no edema, no clubbing, no cyanosis  NEURO: alert & oriented x 3 with fluent speech, no focal motor/sensory deficits, gait normal, reflexes normal and symmetric     STUDIES/RESULTS: No results found.   LABS:    Chemistry      Component Value Date/Time   NA 132* 11/12/2012 1249   NA 129* 09/10/2012 1136   K 4.2 11/12/2012 1249   K 3.2* 09/10/2012 1136   CL 90* 09/10/2012 1136   CO2 27 11/12/2012 1249   CO2 25 09/10/2012 1136   BUN 9.9 11/12/2012 1249   BUN 10 09/10/2012 1136   CREATININE 0.7 11/12/2012 1249   CREATININE 0.47* 09/10/2012 1136      Component Value Date/Time   CALCIUM 10.0 11/12/2012 1249   CALCIUM 9.1 09/10/2012 1136   ALKPHOS 85 11/12/2012 1249   ALKPHOS 80 09/10/2012 1136   AST 24 11/12/2012 1249   AST 24 09/10/2012 1136   ALT 26 11/12/2012 1249   ALT 26 09/10/2012 1136   BILITOT 1.10 11/12/2012 1249    BILITOT 0.9 09/10/2012 1136      Lab Results  Component Value Date   WBC 5.5 11/12/2012   HGB 12.6 11/12/2012   HCT 36.4 11/12/2012   MCV 89.0 11/12/2012   PLT 268 11/12/2012    PATHOLOGY: Biopsy done on 11/06/2002 to  ADDITIONAL INFORMATION: PROGNOSTIC INDICATORS - ACIS Results: IMMUNOHISTOCHEMICAL AND MORPHOMETRIC ANALYSIS BY THE AUTOMATED CELLULAR IMAGING SYSTEM (ACIS) Estrogen Receptor: 100%, POSITIVE, STONG STAINING INTENSITY Progesterone Receptor: 100%, POSITIVE, MODERATE STAINING INTENSITY REFERENCE RANGE ESTROGEN RECEPTOR NEGATIVE <1% POSITIVE =>1% PROGESTERONE RECEPTOR NEGATIVE <1% POSITIVE =>1% All controls stained appropriately Abigail Miyamoto MD Pathologist, Electronic Signature ( Signed 11/12/2012) FINAL DIAGNOSIS Diagnosis Breast, left, needle core biopsy - DUCTAL CARCINOMA IN SITU WITHIN CALCIFICATIONS AND NECROSIS, SEE COMMENT. Diagnosis Note While grading is best performed at the time of excision, the in situ carcinoma appears intermediate grade. Breast prognostic markers will be ordered and reported separately. Dr. Raynald Blend  has reviewed the case. 1 of 2 FINAL for Chelsea Moreno, Chelsea Moreno ([id number]) Diagnosis Note(continued) The case was discussed with Dr. Tilda Burrow on 11/06/2012. ASSESSMENT    62 year old female with  #1 new diagnosis of screen detected left calcifications. These measure 2.0 cm at the 6:00 position. She underwent a stereotactic biopsy that revealed intermediate grade DCIS with calcifications and necrosis. Prognostic panel revealed the tumor to be ER positive PR positive.  #2 patient is a good candidate for lumpectomy only. Patient and I discussed systemic treatment and prevention for stage 0 noninvasive disease. Since tumor is ER positive she will would be a good candidate for antiestrogen therapy with tamoxifen or an aromatase inhibitor. We discussed the side effects of each of these agents today.   Clinical Trial Eligibility:  no Multidisciplinary conference discussion yes     PLAN:    #1 patient will proceed with her surgery first with her lumpectomy.  #2 I will plan on seeing her back after the surgery for discussion of antiestrogen therapy further. She will also need radiation therapy and she will refer back to Dr. Dayton Scrape.       Discussion: Patient is being treated per NCCN breast cancer care guidelines appropriate for stage.0   Thank you so much for allowing me to participate in the care of Chelsea Moreno. I will continue to follow up the patient with you and assist in her care.  All questions were answered. The patient knows to call the clinic with any problems, questions or concerns. We can certainly see the patient much sooner if necessary.  I spent 30 minutes counseling the patient face to face. The total time spent in the appointment was 55 minutes.  Drue Second, MD Medical/Oncology [facility] [phone] (beeper) [phone] (Office)  11/13/2012, 5:28 PM

## 2012-11-14 ENCOUNTER — Telehealth: Payer: Self-pay | Admitting: Oncology

## 2012-11-14 ENCOUNTER — Ambulatory Visit
Admission: RE | Admit: 2012-11-14 | Discharge: 2012-11-14 | Disposition: A | Discharge status: Home / Self Care | Source: Ambulatory Visit | Attending: Radiology | Admitting: Radiology

## 2012-11-14 DIAGNOSIS — D0510 Intraductal carcinoma in situ of unspecified breast: Secondary | ICD-10-CM

## 2012-11-14 MED ORDER — GADOBENATE DIMEGLUMINE 529 MG/ML IV SOLN
18.0000 mL | Freq: Once | INTRAVENOUS | Status: AC | PRN
  Administered 2012-11-14: 18 mL via INTRAVENOUS

## 2012-11-14 NOTE — Telephone Encounter (Signed)
, °

## 2012-11-18 ENCOUNTER — Other Ambulatory Visit (INDEPENDENT_AMBULATORY_CARE_PROVIDER_SITE_OTHER): Payer: Self-pay | Admitting: General Surgery

## 2012-11-24 ENCOUNTER — Encounter: Payer: Self-pay | Admitting: Gastroenterology

## 2012-11-24 ENCOUNTER — Telehealth: Payer: Self-pay | Admitting: *Deleted

## 2012-11-24 ENCOUNTER — Encounter: Payer: Self-pay | Admitting: *Deleted

## 2012-11-24 NOTE — Progress Notes (Signed)
Mailed after appt letter to pt. 

## 2012-11-24 NOTE — Telephone Encounter (Signed)
Left vm for pt to return call regarding [facility] 11/12/12.

## 2012-11-28 ENCOUNTER — Telehealth: Payer: Self-pay | Admitting: Oncology

## 2012-11-28 NOTE — Telephone Encounter (Signed)
, °

## 2012-12-10 ENCOUNTER — Ambulatory Visit (HOSPITAL_COMMUNITY)
Admission: RE | Admit: 2012-12-10 | Discharge: 2012-12-10 | Disposition: A | Discharge status: Home / Self Care | Source: Ambulatory Visit | Attending: Anesthesiology | Admitting: Anesthesiology

## 2012-12-10 ENCOUNTER — Encounter (HOSPITAL_COMMUNITY)
Admission: RE | Admit: 2012-12-10 | Discharge: 2012-12-10 | Disposition: A | Discharge status: Home / Self Care | Source: Ambulatory Visit | Attending: General Surgery | Admitting: General Surgery

## 2012-12-10 ENCOUNTER — Encounter (HOSPITAL_COMMUNITY): Payer: Self-pay

## 2012-12-10 DIAGNOSIS — M810 Age-related osteoporosis without current pathological fracture: Secondary | ICD-10-CM | POA: Insufficient documentation

## 2012-12-10 DIAGNOSIS — Z87891 Personal history of nicotine dependence: Secondary | ICD-10-CM | POA: Insufficient documentation

## 2012-12-10 DIAGNOSIS — E860 Dehydration: Secondary | ICD-10-CM | POA: Insufficient documentation

## 2012-12-10 DIAGNOSIS — I10 Essential (primary) hypertension: Secondary | ICD-10-CM | POA: Insufficient documentation

## 2012-12-10 DIAGNOSIS — C50919 Malignant neoplasm of unspecified site of unspecified female breast: Secondary | ICD-10-CM | POA: Insufficient documentation

## 2012-12-10 DIAGNOSIS — E669 Obesity, unspecified: Secondary | ICD-10-CM | POA: Insufficient documentation

## 2012-12-10 DIAGNOSIS — E785 Hyperlipidemia, unspecified: Secondary | ICD-10-CM | POA: Insufficient documentation

## 2012-12-10 DIAGNOSIS — E232 Diabetes insipidus: Secondary | ICD-10-CM | POA: Insufficient documentation

## 2012-12-10 HISTORY — DX: Bursopathy, unspecified: M71.9

## 2012-12-10 HISTORY — DX: Unspecified hemorrhoids: K64.9

## 2012-12-10 HISTORY — DX: Vitamin D deficiency, unspecified: E55.9

## 2012-12-10 HISTORY — DX: Diabetes insipidus: E23.2

## 2012-12-10 HISTORY — DX: Diarrhea, unspecified: R19.7

## 2012-12-10 HISTORY — DX: Personal history of other diseases of the nervous system and sense organs: Z86.69

## 2012-12-10 HISTORY — DX: Personal history of other diseases of the respiratory system: Z87.09

## 2012-12-10 HISTORY — DX: Dizziness and giddiness: R42

## 2012-12-10 HISTORY — DX: Other muscle spasm: M62.838

## 2012-12-10 LAB — CBC
HCT: 36.5 % (ref 36.0–46.0)
Hemoglobin: 13.5 g/dL (ref 12.0–15.0)
MCH: 31.8 pg (ref 26.0–34.0)
MCHC: 37 g/dL — ABNORMAL HIGH (ref 30.0–36.0)
MCV: 86.1 fL (ref 78.0–100.0)
Platelets: 243 10*3/uL (ref 150–400)
RBC: 4.24 MIL/uL (ref 3.87–5.11)
RDW: 12.1 % (ref 11.5–15.5)
WBC: 3.6 10*3/uL — ABNORMAL LOW (ref 4.0–10.5)

## 2012-12-10 LAB — BASIC METABOLIC PANEL
CO2: 28 mEq/L (ref 19–32)
Calcium: 9.3 mg/dL (ref 8.4–10.5)
Chloride: 88 mEq/L — ABNORMAL LOW (ref 96–112)
Creatinine, Ser: 0.5 mg/dL (ref 0.50–1.10)
GFR calc non Af Amer: >90 mL/min (ref >90)
Glucose, Bld: 102 mg/dL — ABNORMAL HIGH (ref 70–99)
Potassium: 3.5 mEq/L (ref 3.5–5.1)
Sodium: 127 mEq/L — ABNORMAL LOW (ref 135–145)

## 2012-12-10 MED ORDER — CHLORHEXIDINE GLUCONATE 4 % EX LIQD: 1.0000 "application " | Freq: Once | CUTANEOUS | Status: DC

## 2012-12-10 NOTE — Progress Notes (Signed)
12/10/12 0937  OBSTRUCTIVE SLEEP APNEA  Have you ever been diagnosed with sleep apnea through a sleep study? No  Do you snore loudly (loud enough to be heard through closed doors)?  1  Do you often feel tired, fatigued, or sleepy during the daytime? 0  Has anyone observed you stop breathing during your sleep? 0  Do you have, or are you being treated for high blood pressure? 1  BMI more than 35 kg/m2? 1  Age over 62 years old? 1  Neck circumference greater than 40 cm/18 inches? 0  Gender: 0  Obstructive Sleep Apnea Score 4  Score 4 or greater  Results sent to PCP

## 2012-12-10 NOTE — Pre-Procedure Instructions (Signed)
Chelsea Moreno  12/10/2012   Your procedure is scheduled on:  Tues, Dec 9 @ 3:30 PM  Report to [facility] Short Stay Entrance A at 1:30 PM.  Call this number if you have problems the morning of surgery: [phone]   Remember:   Do not eat food or drink liquids after midnight.   Take these medicines the morning of surgery with A SIP OF WATER: Lexapro(Escitalopram)               No Goody's,BC's,Aleve,Aspirin,Ibuprofen,Fish Oil,or any Herbal Medications   Do not wear jewelry, make-up or nail polish.  Do not wear lotions, powders, or perfumes. You may wear deodorant.  Do not shave 48 hours prior to surgery.   Do not bring valuables to the hospital.  [facility] is not responsible                  for any belongings or valuables.               Contacts, dentures or bridgework may not be worn into surgery.  Leave suitcase in the car. After surgery it may be brought to your room.  For patients admitted to the hospital, discharge time is determined by your                treatment team.               Patients discharged the day of surgery will not be allowed to drive  home.    Special Instructions: Shower using CHG 2 nights before surgery and the night before surgery.  If you shower the day of surgery use CHG.  Use special wash - you have one bottle of CHG for all showers.  You should use approximately 1/3 of the bottle for each shower.   Please read over the following fact sheets that you were given: Pain Booklet, Coughing and Deep Breathing and Surgical Site Infection Prevention

## 2012-12-10 NOTE — Progress Notes (Addendum)
Pt doesn't have a cardiologist  Stress test done around 2009  Denies ever having an echo or heart cath     Medical Md is Dr.Peter Amador Cunas   EKG in epic from 09-10-12  Denies cxr in past yr

## 2012-12-11 NOTE — Progress Notes (Signed)
Quick Note:  She seems dehydrated. Can we forward these labs to Dr. Kirtland Bouchard (PCP)? She has history of diabetes insipidus and may need meds adjusted. ______

## 2012-12-11 NOTE — Progress Notes (Addendum)
Anesthesia Chart Review:  Patient is a 62 year old female scheduled for left needle localized partial mastectomy on 12/16/12 by Dr. Donell Beers.  History includes left breast cancer DCIS, obesity, former smoker, migraines, depression, diabetes insipidus of DDAVP, HTN, HLD, osteoporosis. OSA screening score was 4.  PCP is Dr. Eleonore Chiquito.    EKG on 09/10/12 showed NSR, cannot rule out anterior infarct (age undetermined), non-specific T wave abnormality.  Overall, I think her EKG is stable when compared to prior EKGs from 03/10/11 and 08/19/12.  Last stress test was > 3 years ago (2009).  CXR on 12/10/12 showed no active cardiopulmonary disease.  Preoperative labs noted.  Na 127, Cl 88, K 3.5, BUN 6, Cr 0.50.  These have already been reviewed by Dr. Donell Beers.  She has asked her staff to forward them to Dr. Amador Cunas so he can make medication adjustments if felt appropriate before surgery. (See her note in Epic.)  I'll order an ISTAT for the day of surgery to re-evaluate for hyponatremia. If stable then I would anticipate that she could proceed.  [facility] Short Stay Center/Anesthesiology Phone [phone] 12/11/2012 5:50 PM

## 2012-12-15 MED ORDER — CEFAZOLIN SODIUM-DEXTROSE 2-3 GM-% IV SOLR
2.0000 g | INTRAVENOUS | Status: AC
  Administered 2012-12-16: 2 g via INTRAVENOUS
  Filled 2012-12-15: qty 50

## 2012-12-16 ENCOUNTER — Ambulatory Visit (HOSPITAL_COMMUNITY)
Admission: RE | Admit: 2012-12-16 | Discharge: 2012-12-16 | Disposition: A | Discharge status: Home / Self Care | Source: Ambulatory Visit | Attending: General Surgery | Admitting: General Surgery

## 2012-12-16 ENCOUNTER — Encounter (HOSPITAL_COMMUNITY): Payer: Self-pay | Admitting: *Deleted

## 2012-12-16 ENCOUNTER — Encounter (HOSPITAL_COMMUNITY)
Admission: RE | Disposition: A | Discharge status: Home / Self Care | Payer: Self-pay | Source: Ambulatory Visit | Attending: General Surgery

## 2012-12-16 ENCOUNTER — Encounter (HOSPITAL_COMMUNITY): Admitting: Vascular Surgery

## 2012-12-16 ENCOUNTER — Ambulatory Visit (HOSPITAL_COMMUNITY): Admitting: Anesthesiology

## 2012-12-16 DIAGNOSIS — Z170 Estrogen receptor positive status [ER+]: Secondary | ICD-10-CM | POA: Insufficient documentation

## 2012-12-16 DIAGNOSIS — Z860100 Personal history of colon polyps, unspecified: Secondary | ICD-10-CM | POA: Insufficient documentation

## 2012-12-16 DIAGNOSIS — F329 Major depressive disorder, single episode, unspecified: Secondary | ICD-10-CM | POA: Insufficient documentation

## 2012-12-16 DIAGNOSIS — Z79899 Other long term (current) drug therapy: Secondary | ICD-10-CM | POA: Insufficient documentation

## 2012-12-16 DIAGNOSIS — I10 Essential (primary) hypertension: Secondary | ICD-10-CM | POA: Insufficient documentation

## 2012-12-16 DIAGNOSIS — M810 Age-related osteoporosis without current pathological fracture: Secondary | ICD-10-CM | POA: Insufficient documentation

## 2012-12-16 DIAGNOSIS — Z87891 Personal history of nicotine dependence: Secondary | ICD-10-CM | POA: Insufficient documentation

## 2012-12-16 DIAGNOSIS — F3289 Other specified depressive episodes: Secondary | ICD-10-CM | POA: Insufficient documentation

## 2012-12-16 DIAGNOSIS — E232 Diabetes insipidus: Secondary | ICD-10-CM | POA: Insufficient documentation

## 2012-12-16 DIAGNOSIS — E785 Hyperlipidemia, unspecified: Secondary | ICD-10-CM | POA: Insufficient documentation

## 2012-12-16 DIAGNOSIS — D0590 Unspecified type of carcinoma in situ of unspecified breast: Secondary | ICD-10-CM

## 2012-12-16 HISTORY — PX: BREAST LUMPECTOMY WITH NEEDLE LOCALIZATION: SHX5759

## 2012-12-16 LAB — POCT I-STAT 4, (NA,K, GLUC, HGB,HCT)
Glucose, Bld: 102 mg/dL — ABNORMAL HIGH (ref 70–99)
Hemoglobin: 13.9 g/dL (ref 12.0–15.0)
Sodium: 123 mEq/L — ABNORMAL LOW (ref 135–145)

## 2012-12-16 SURGERY — BREAST LUMPECTOMY WITH NEEDLE LOCALIZATION
Anesthesia: General | Site: Breast | Laterality: Left

## 2012-12-16 MED ORDER — HYDROCODONE-ACETAMINOPHEN 5-325 MG PO TABS: 1.0000 | ORAL_TABLET | ORAL | Status: DC | PRN

## 2012-12-16 MED ORDER — LIDOCAINE HCL (CARDIAC) 20 MG/ML IV SOLN
INTRAVENOUS | Status: DC | PRN
  Administered 2012-12-16: 75 mg via INTRAVENOUS

## 2012-12-16 MED ORDER — SUCCINYLCHOLINE CHLORIDE 20 MG/ML IJ SOLN
INTRAMUSCULAR | Status: DC | PRN
  Administered 2012-12-16: 140 mg via INTRAVENOUS

## 2012-12-16 MED ORDER — ROCURONIUM BROMIDE 100 MG/10ML IV SOLN
INTRAVENOUS | Status: DC | PRN
  Administered 2012-12-16: 20 mg via INTRAVENOUS

## 2012-12-16 MED ORDER — OXYCODONE HCL 5 MG/5ML PO SOLN: 5.0000 mg | Freq: Once | ORAL | Status: AC | PRN

## 2012-12-16 MED ORDER — OXYCODONE HCL 5 MG PO TABS
5.0000 mg | ORAL_TABLET | Freq: Once | ORAL | Status: AC | PRN
  Administered 2012-12-16: 5 mg via ORAL

## 2012-12-16 MED ORDER — HYDROMORPHONE HCL PF 1 MG/ML IJ SOLN
INTRAMUSCULAR | Status: AC
  Filled 2012-12-16: qty 1

## 2012-12-16 MED ORDER — NEOSTIGMINE METHYLSULFATE 1 MG/ML IJ SOLN
INTRAMUSCULAR | Status: DC | PRN
  Administered 2012-12-16: 3 mg via INTRAVENOUS

## 2012-12-16 MED ORDER — LIDOCAINE HCL 1 % IJ SOLN
INTRAMUSCULAR | Status: DC | PRN
  Administered 2012-12-16: 16:00:00

## 2012-12-16 MED ORDER — OXYCODONE HCL 5 MG PO TABS
ORAL_TABLET | ORAL | Status: AC
  Filled 2012-12-16: qty 1

## 2012-12-16 MED ORDER — PROPOFOL 10 MG/ML IV BOLUS
INTRAVENOUS | Status: DC | PRN
  Administered 2012-12-16: 180 mg via INTRAVENOUS

## 2012-12-16 MED ORDER — ONDANSETRON HCL 4 MG/2ML IJ SOLN
INTRAMUSCULAR | Status: DC | PRN
  Administered 2012-12-16: 4 mg via INTRAVENOUS

## 2012-12-16 MED ORDER — MIDAZOLAM HCL 5 MG/5ML IJ SOLN
INTRAMUSCULAR | Status: DC | PRN
  Administered 2012-12-16: 2 mg via INTRAVENOUS

## 2012-12-16 MED ORDER — 0.9 % SODIUM CHLORIDE (POUR BTL) OPTIME
TOPICAL | Status: DC | PRN
  Administered 2012-12-16: 1000 mL

## 2012-12-16 MED ORDER — GLYCOPYRROLATE 0.2 MG/ML IJ SOLN
INTRAMUSCULAR | Status: DC | PRN
  Administered 2012-12-16: 0.6 mg via INTRAVENOUS

## 2012-12-16 MED ORDER — HYDROMORPHONE HCL PF 1 MG/ML IJ SOLN
0.2500 mg | INTRAMUSCULAR | Status: DC | PRN
  Administered 2012-12-16 (×2): 0.5 mg via INTRAVENOUS

## 2012-12-16 MED ORDER — PROMETHAZINE HCL 25 MG/ML IJ SOLN: 6.2500 mg | INTRAMUSCULAR | Status: DC | PRN

## 2012-12-16 MED ORDER — ONDANSETRON HCL 4 MG PO TABS: 4.0000 mg | ORAL_TABLET | Freq: Three times a day (TID) | ORAL | Status: DC | PRN

## 2012-12-16 MED ORDER — LACTATED RINGERS IV SOLN
INTRAVENOUS | Status: DC | PRN
  Administered 2012-12-16: 15:00:00 via INTRAVENOUS

## 2012-12-16 MED ORDER — FENTANYL CITRATE 0.05 MG/ML IJ SOLN
INTRAMUSCULAR | Status: DC | PRN
  Administered 2012-12-16 (×2): 50 ug via INTRAVENOUS
  Administered 2012-12-16: 100 ug via INTRAVENOUS

## 2012-12-16 SURGICAL SUPPLY — 48 items
BINDER BREAST LRG (GAUZE/BANDAGES/DRESSINGS) IMPLANT
BINDER BREAST XLRG (GAUZE/BANDAGES/DRESSINGS) ×2 IMPLANT
BLADE SURG 10 STRL SS (BLADE) ×2 IMPLANT
BLADE SURG 15 STRL LF DISP TIS (BLADE) ×1 IMPLANT
BLADE SURG 15 STRL SS (BLADE) ×2
CANISTER SUCTION 2500CC (MISCELLANEOUS) ×2 IMPLANT
CHLORAPREP W/TINT 26ML (MISCELLANEOUS) ×2 IMPLANT
CLIP TI LARGE 6 (CLIP) ×2 IMPLANT
COVER SURGICAL LIGHT HANDLE (MISCELLANEOUS) ×2 IMPLANT
DEVICE DUBIN SPECIMEN MAMMOGRA (MISCELLANEOUS) ×2 IMPLANT
DRAPE CHEST BREAST 15X10 FENES (DRAPES) ×2 IMPLANT
DRAPE UTILITY 15X26 W/TAPE STR (DRAPE) ×6 IMPLANT
DRSG PAD ABDOMINAL 8X10 ST (GAUZE/BANDAGES/DRESSINGS) ×2 IMPLANT
ELECT CAUTERY BLADE 6.4 (BLADE) ×2 IMPLANT
ELECT REM PT RETURN 9FT ADLT (ELECTROSURGICAL) ×2
ELECTRODE REM PT RTRN 9FT ADLT (ELECTROSURGICAL) ×1 IMPLANT
GLOVE BIO SURGEON STRL SZ 6 (GLOVE) ×2 IMPLANT
GLOVE BIOGEL PI IND STRL 6.5 (GLOVE) ×2 IMPLANT
GLOVE BIOGEL PI INDICATOR 6.5 (GLOVE) ×2
GLOVE ECLIPSE 6.5 STRL STRAW (GLOVE) ×2 IMPLANT
GLOVE SKINSENSE NS SZ6.5 (GLOVE) ×1
GLOVE SKINSENSE STRL SZ6.5 (GLOVE) ×1 IMPLANT
GOWN PREVENTION PLUS XXLARGE (GOWN DISPOSABLE) ×2 IMPLANT
GOWN SRG XL XLNG 56XLVL 4 (GOWN DISPOSABLE) ×1 IMPLANT
GOWN STRL NON-REIN LRG LVL3 (GOWN DISPOSABLE) ×2 IMPLANT
GOWN STRL NON-REIN XL XLG LVL4 (GOWN DISPOSABLE) ×2
KIT BASIN OR (CUSTOM PROCEDURE TRAY) ×2 IMPLANT
KIT MARKER MARGIN INK (KITS) IMPLANT
KIT ROOM TURNOVER OR (KITS) ×2 IMPLANT
NEEDLE HYPO 25GX1X1/2 BEV (NEEDLE) ×2 IMPLANT
NS IRRIG 1000ML POUR BTL (IV SOLUTION) ×2 IMPLANT
PACK SURGICAL SETUP 50X90 (CUSTOM PROCEDURE TRAY) ×2 IMPLANT
PAD ARMBOARD 7.5X6 YLW CONV (MISCELLANEOUS) ×2 IMPLANT
PENCIL BUTTON HOLSTER BLD 10FT (ELECTRODE) ×2 IMPLANT
SPONGE GAUZE 4X4 12PLY (GAUZE/BANDAGES/DRESSINGS) ×2 IMPLANT
SPONGE LAP 18X18 X RAY DECT (DISPOSABLE) ×2 IMPLANT
STRIP CLOSURE SKIN 1/2X4 (GAUZE/BANDAGES/DRESSINGS) ×2 IMPLANT
SUT MNCRL AB 4-0 PS2 18 (SUTURE) ×2 IMPLANT
SUT SILK 2 0 SH (SUTURE) ×4 IMPLANT
SUT VIC AB 2-0 SH 27 (SUTURE) ×2
SUT VIC AB 2-0 SH 27XBRD (SUTURE) ×1 IMPLANT
SUT VIC AB 3-0 SH 27 (SUTURE) ×2
SUT VIC AB 3-0 SH 27X BRD (SUTURE) ×1 IMPLANT
SYR CONTROL 10ML LL (SYRINGE) ×2 IMPLANT
TOWEL OR 17X24 6PK STRL BLUE (TOWEL DISPOSABLE) ×2 IMPLANT
TOWEL OR 17X26 10 PK STRL BLUE (TOWEL DISPOSABLE) ×2 IMPLANT
TUBE CONNECTING 12X1/4 (SUCTIONS) ×2 IMPLANT
YANKAUER SUCT BULB TIP NO VENT (SUCTIONS) ×2 IMPLANT

## 2012-12-16 NOTE — Op Note (Signed)
Left needle localized partial mastectomy  Indications: This patient presents with history of left breast cancer, cTis  Pre-operative Diagnosis: left breast cancer  Post-operative Diagnosis: left breast cancer  Surgeon: Almond Lint   Anesthesia: General endotracheal anesthesia and Local anesthesia 0.25.% bupivacaine, with epinephrine  ASA Class: 3  Procedure Details  The patient was seen in the Holding Room. The risks, benefits, complications, treatment options, and expected outcomes were discussed with the patient. The possibilities of reaction to medication, pulmonary aspiration, bleeding, infection, the need for additional procedures, failure to diagnose a condition, and creating a complication requiring transfusion or operation were discussed with the patient. The patient concurred with the proposed plan, giving informed consent.  The site of surgery properly noted/marked. The patient was taken to Operating Room # 2, identified, and the procedure verified. A Time Out was held and the above information confirmed.  After induction of anesthesia, the left  breast and chest were prepped and draped in standard fashion. The lumpectomy was performed by creating an oblique incision over the lower inner quadrant of the breastaround the previously placed localization guidewires.  Dissection was carried down around the wires.  The specimen was marked with the margin marker paint kit.  Specimen radiography confirmed inclusion of the mammographic lesion, however, the inferior clip moved.  Hemostasis was achieved with cautery.  Additional tissue was taken along the lateral, posterior and inferior margin and submitted separately to pathology after providing orientation for the pathology.  The deep tissue was pexed to minimize the defect.  The wound was irrigated and closed with a 3-0 Vicryl interrupted deep dermal stitch and a 4-0 Monocryl subcuticular closure in layers.    Sterile dressings were applied. At  the end of the operation, all sponge, instrument, and needle counts were correct.  Findings: grossly clear surgical margins  Estimated Blood Loss:  Minimal             Specimens: left needle localized partial mastectomy         Complications:  None; patient tolerated the procedure well.         Disposition: PACU - hemodynamically stable.         Condition: stable

## 2012-12-16 NOTE — Anesthesia Preprocedure Evaluation (Signed)
Anesthesia Evaluation    Reviewed: Allergy & Precautions, H&P , NPO status , Patient's Chart, lab work & pertinent test results  Airway       Dental   Pulmonary neg pulmonary ROS, former smoker,          Cardiovascular hypertension, On Medications negative cardio ROS      Neuro/Psych Diabetes Insipidus negative neurological ROS  negative psych ROS   GI/Hepatic negative GI ROS, Neg liver ROS,   Endo/Other  negative endocrine ROS  Renal/GU negative Renal ROS     Musculoskeletal   Abdominal   Peds  Hematology   Anesthesia Other Findings   Reproductive/Obstetrics negative OB ROS                           Anesthesia Physical Anesthesia Plan  ASA: II  Anesthesia Plan: General   Post-op Pain Management:    Induction: Intravenous  Airway Management Planned: LMA  Additional Equipment:   Intra-op Plan:   Post-operative Plan: Extubation in OR  Informed Consent:   Plan Discussed with: CRNA, Anesthesiologist and Surgeon  Anesthesia Plan Comments:         Anesthesia Quick Evaluation

## 2012-12-16 NOTE — Anesthesia Postprocedure Evaluation (Signed)
Anesthesia Post Note  Patient: Chelsea Moreno  Procedure(s) Performed: Procedure(s) (LRB): PARTIAL MASTECTOMY WITH NEEDLE LOCALIZATION (Left)  Anesthesia type: general  Patient location: PACU  Post pain: Pain level controlled  Post assessment: Patient's Cardiovascular Status Stable   Post vital signs: Reviewed and stable  Level of consciousness: sedated  Complications: No apparent anesthesia complications

## 2012-12-16 NOTE — Transfer of Care (Signed)
Immediate Anesthesia Transfer of Care Note  Patient: Chelsea Moreno  Procedure(s) Performed: Procedure(s) with comments: PARTIAL MASTECTOMY WITH NEEDLE LOCALIZATION (Left) - 1:00 NL at [facility]   Patient Location: PACU  Anesthesia Type:General  Level of Consciousness: awake, alert  and oriented  Airway & Oxygen Therapy: Patient Spontanous Breathing and Patient connected to nasal cannula oxygen  Post-op Assessment: Report given to PACU RN and Post -op Vital signs reviewed and stable  Post vital signs: Reviewed and stable  Complications: No apparent anesthesia complications

## 2012-12-16 NOTE — Anesthesia Procedure Notes (Signed)
Procedure Name: Intubation Date/Time: 12/16/2012 3:52 PM Performed by: Gwenyth Allegra Pre-anesthesia Checklist: Patient identified, Timeout performed, Emergency Drugs available, Suction available and Patient being monitored Patient Re-evaluated:Patient Re-evaluated prior to inductionOxygen Delivery Method: Circle system utilized Intubation Type: IV induction, Rapid sequence and Cricoid Pressure applied Tube size: 7.0 mm Airway Equipment and Method: Stylet Placement Confirmation: ETT inserted through vocal cords under direct vision and breath sounds checked- equal and bilateral Secured at: 22 cm Tube secured with: Tape Dental Injury: Teeth and Oropharynx as per pre-operative assessment

## 2012-12-16 NOTE — H&P (Signed)
Chief complaint: New left breast cancer  HISTORY:  Patient is a 62 year old female who presented with screening detected calcifications. She is referred by Dr. Amador Cunas for consultation regarding her new cancer. After the calcifications were seen, and she underwent diagnostic mammogram and ultrasound. Biopsy was positive for grade 2 DCIS that was ER positive and PR negative.  The patient has a history of diabetes insipidus and hypercholesterolemia as well as hypertension. Her mother had ovarian cancer. She has no personal history of cancer. She has one child. Her first menstrual period was at age 83 and she ceased having periods around 12 years ago. She denies hormone replacement. She did use oral contraception for approximately a month. She had one child at age 45. She's up-to-date on her colonoscopy, bone density, and Pap smear.   Past Medical History   Diagnosis  Date   .  COLONIC POLYPS, HX OF  09/24/2008   .  DEPRESSION  09/23/2007   .  HYPERLIPIDEMIA  09/06/2006   .  HYPERTENSION  09/06/2006   .  OSTEOPOROSIS  09/24/2008   .  Breast cancer    Past surgical history:  Left breast biopsy.  Current Outpatient Prescriptions   Medication  Sig  Dispense  Refill   .  atorvastatin (LIPITOR) 80 MG tablet  Take 80 mg by mouth every evening.     .  bimatoprost (LUMIGAN) 0.03 % ophthalmic drops  Place 1 drop into both eyes at bedtime.     .  Calcium Carbonate-Vitamin D (CALCIUM + D PO)  Take 2 tablets by mouth daily. 600 mg /800 units     .  cholecalciferol (VITAMIN D) 1000 UNITS tablet  Take 2,000 Units by mouth daily.     .  Clobetasol Prop Emollient Base (CLOBETASOL PROPIONATE E) 0.05 % emollient cream  Apply 1 application topically daily as needed (for area on leg).     Marland Kitchen  desmopressin (DDAVP) 0.01 % SOLN  Place 1 spray into the nose 2 (two) times daily as needed.     Marland Kitchen  escitalopram (LEXAPRO) 10 MG tablet  TAKE ONE TABLET BY MOUTH ONE TIME DAILY  90 tablet  1   .  lisinopril (PRINIVIL,ZESTRIL)  20 MG tablet  Take 1 tablet (20 mg total) by mouth daily.  90 tablet  3   .  potassium chloride SA (K-DUR,KLOR-CON) 20 MEQ tablet  Take 1 tablet (20 mEq total) by mouth 2 (two) times daily.  90 tablet  6   .  risedronate (ACTONEL) 150 MG tablet  Take 150 mg by mouth every 30 (thirty) days. with water on empty stomach, nothing by mouth or lie down for next 30 minutes.       No current facility-administered medications for this visit.   No Known Allergies  Family History   Problem  Relation  Age of Onset   .  Ovarian cancer  Mother     History    Social History   .  Marital Status:  Widowed     Spouse Name:  N/A     Number of Children:  N/A   .  Years of Education:  N/A    Social History Main Topics   .  Smoking status:  Former Smoker     Quit date:  01/09/1968   .  Smokeless tobacco:  Never Used   .  Alcohol Use:  No   .  Drug Use:  No   .  Sexual Activity:  Not on  file    Other Topics  Concern   .  Not on file    Social History Narrative   .  No narrative on file    REVIEW OF SYSTEMS - PERTINENT POSITIVES ONLY:  12 point review of systems negative other than HPI and PMH except for nausea/vomiting, easy bruising, joint pain, history of fainting.   EXAM:  Wt Readings from Last 3 Encounters:  12/10/12 206 lb 3.2 oz (93.532 kg)  11/12/12 209 lb 11.2 oz (95.119 kg)  09/15/12 206 lb (93.441 kg)   Temp Readings from Last 3 Encounters:  12/16/12 97.7 F (36.5 C) Oral  12/16/12 97.7 F (36.5 C) Oral  12/10/12 98.1 F (36.7 C)    BP Readings from Last 3 Encounters:  12/16/12 154/56  12/16/12 154/56  12/10/12 146/81   Pulse Readings from Last 3 Encounters:  12/16/12 77  12/16/12 77  12/10/12 69   Gen: No acute distress. Well nourished and well groomed.  Neurological: Alert and oriented to person, place, and time. Coordination normal.  Head: Normocephalic and atraumatic.  Cardiovascular: Normal rate, regular rhythm Respiratory: Effort normal. No respiratory  distress.  Breast: Breasts are ptotic bilaterally, no palpable masses. Some bruising at biopsy site. No palpable adenopathy, nipple retraction, nipple discharge.  Musculoskeletal: Normal range of motion. Extremities are nontender.  Skin: Skin is warm and dry. No rash noted. No diaphoresis. No erythema. No pallor. No clubbing, cyanosis, or edema.  Psychiatric: Normal mood and affect. Behavior is normal. Judgment and thought content normal  LABORATORY RESULTS:  Available labs are reviewed  Pathology  Diagnosis  Breast, left, needle core biopsy  - DUCTAL CARCINOMA IN SITU WITHIN CALCIFICATIONS AND NECROSIS, SEE COMMENT.  ER/PR 100% positive.   RADIOLOGY RESULTS:  See E-Chart or I-Site for most recent results. Images and reports are reviewed.  mammogram  Heterogeneous calcifications at 6 o'clock   ASSESSMENT AND PLAN:  Cancer of lower-inner quadrant of female breast  Patient appears to have a 2 cm area of DCIS in the left breast. I counseled her regarding the risk of surgery.  The surgical procedure was described to the patient. I discussed the incision type and location and whether we would need radiology involved on the day of surgery with a wire marker and/or sentinel node.  The risks and benefits of the procedure were described to the patient and he/she wishes to proceed.  We discussed the risks bleeding, infection, damage to other structures, need for further procedures/surgeries. We discussed the risk of seroma. The patient was advised if the area in the breast has invasive cancer, we may need to go back to surgery for additional tissue to obtain negative margins or for a lymph node biopsy.   Maudry Diego MD  Surgical Oncology, General and Endocrine Surgery  [facility]    Visit Diagnoses:  1.  Cancer of lower-inner quadrant of female breast, left   Primary Care Physician:  Rogelia Boga, MD  Dr. Dayton Scrape, radiation oncology  Dr. Welton Flakes, oncology   Marianne Sofia, RN

## 2012-12-17 ENCOUNTER — Telehealth: Payer: Self-pay | Admitting: *Deleted

## 2012-12-17 NOTE — Telephone Encounter (Signed)
Spoke to pt told her received labs from [facility] and Dr. Kirtland Bouchard reviewed them and would like you to decrease Desmopressin (DDAVP) spray to once daily and make an appointment to see him in 2 weeks. Pt verbalized understanding. Transferred to scheduling.

## 2012-12-18 ENCOUNTER — Encounter (HOSPITAL_COMMUNITY): Payer: Self-pay | Admitting: General Surgery

## 2012-12-19 NOTE — Progress Notes (Signed)
Quick Note:  Please let patient know all margins are negative. Pathology remains DCIS (non invasive cancer). No further surgery needed. ______

## 2012-12-23 ENCOUNTER — Encounter (INDEPENDENT_AMBULATORY_CARE_PROVIDER_SITE_OTHER): Payer: Self-pay

## 2012-12-23 ENCOUNTER — Encounter (INDEPENDENT_AMBULATORY_CARE_PROVIDER_SITE_OTHER): Admitting: General Surgery

## 2012-12-23 ENCOUNTER — Ambulatory Visit (INDEPENDENT_AMBULATORY_CARE_PROVIDER_SITE_OTHER): Admitting: General Surgery

## 2012-12-23 VITALS — BP 126/72 | HR 68 | Temp 99.0°F | Resp 18 | Ht 61.5 in | Wt 197.0 lb

## 2012-12-23 DIAGNOSIS — C50312 Malignant neoplasm of lower-inner quadrant of left female breast: Secondary | ICD-10-CM

## 2012-12-23 DIAGNOSIS — C50319 Malignant neoplasm of lower-inner quadrant of unspecified female breast: Secondary | ICD-10-CM

## 2012-12-23 NOTE — Assessment & Plan Note (Signed)
Small hematoma should resolve on its own.    Follow up in 3-6 months, depending on when follow up with Dr. Welton Flakes will be.    She has appts with radiation and oncology.

## 2012-12-23 NOTE — Progress Notes (Signed)
HISTORY: Patient is approximately 2 weeks status post left needle localized partial mastectomy for DCIS. She is still a bit sore, but she has not had significant pain. She only took about 3 pain pills. She has taken a few Tylenol, but she cannot take nonsteroidal anti-inflammatory agents due to her diabetes insipidus and the desmopressin that she takes.  She has not been able to wear her regular bras, but she is wearing sports bras. She does have some bruising on the lateral aspect where the wire was placed.    EXAM: General:  Alert and oriented.  No distress Incision:  No evidence of infection.  Small hematoma.     PATHOLOGY: Diagnosis 1. Breast, partial mastectomy, Left - DUCTAL CARCINOMA IN SITU, SEE COMMENT. - IN SITU CARCINOMA IS 2 MM FROM THE NEAREST MARGIN (LATERAL) - SEE TUMOR SYNOPTIC TEMPLATE BELOW 2. Breast, excision, Left, additional inferior margin - BENIGN BREAST TISSUE, SEE COMMENT. - NEGATIVE FOR ATYPIA OR MALIGNANCY. - SURGICAL MARGINS, NEGATIVE FOR ATYPIA OR MALIGNANCY. 3. Breast, excision, New posterior margin - BENIGN BREAST TISSUE, SEE COMMENT. - NEGATIVE FOR ATYPIA OR MALIGNANCY. - SURGICAL MARGINS, NEGATIVE FOR ATYPIA OR MALIGNANCY. 4. Breast, excision, New lateral margin - BENIGN BREAST TISSUE, SEE COMMENT. - NEGATIVE FOR ATYPIA OR MALIGNANCY. - SURGICAL MARGINS, NEGATIVE FOR ATYPIA OR MALIGNANCY.   ASSESSMENT AND PLAN:   Cancer of lower-inner quadrant of Left breast, BCT 12/2012 Small hematoma should resolve on its own.    Follow up in 3-6 months, depending on when follow up with Dr. Welton Flakes will be.    She has appts with radiation and oncology.        Maudry Diego, MD Surgical Oncology, General & Endocrine Surgery [facility]  Rogelia Boga, MD Gordy Savers, MD

## 2012-12-23 NOTE — Patient Instructions (Signed)
Follow up in 3-6 months

## 2012-12-24 ENCOUNTER — Encounter: Payer: Self-pay | Admitting: Gastroenterology

## 2012-12-29 ENCOUNTER — Ambulatory Visit (INDEPENDENT_AMBULATORY_CARE_PROVIDER_SITE_OTHER): Admitting: Internal Medicine

## 2012-12-29 ENCOUNTER — Encounter: Payer: Self-pay | Admitting: Internal Medicine

## 2012-12-29 VITALS — BP 120/78 | Temp 98.4°F | Wt 202.0 lb

## 2012-12-29 DIAGNOSIS — E871 Hypo-osmolality and hyponatremia: Secondary | ICD-10-CM

## 2012-12-29 DIAGNOSIS — E232 Diabetes insipidus: Secondary | ICD-10-CM

## 2012-12-29 NOTE — Patient Instructions (Signed)
Fluid restricted diet  Return in 6 months for follow-up

## 2012-12-29 NOTE — Progress Notes (Signed)
Pre visit review using our clinic review tool, if applicable. No additional management support is needed unless otherwise documented below in the visit note. 

## 2012-12-29 NOTE — Progress Notes (Signed)
Subjective:    Patient ID: Chelsea Moreno, female    DOB: 03-Feb-1950, 62 y.o.   MRN: [id number]  HPI 62 year old patient who has a history of diabetes insipidus and is on nasal desmopressin.  She has a recent diagnosis of left DCIS and is status post partial mastectomy. Laboratory studies have revealed hyponatremia.   The patient is a avid consumer of fluids and states she always has liquids available for sitting throughout the day.  Attempts to decrease DDAVP results in significant polyuria. Today she feels well. She has had surgery and is scheduled for medical and radiation oncology followup next month  Past Medical History  Diagnosis Date  . COLONIC POLYPS, HX OF 09/24/2008  . HYPERLIPIDEMIA 09/06/2006    takes Atorvasatin nightly  . OSTEOPOROSIS 09/24/2008    takes Actonel every 30days  . HYPERTENSION 09/06/2006    takes Lisinopril daily  . History of bronchitis     yrs ago  . History of migraine     last time many yrs ago  . Dizziness     when nauseated gets dizzy  . Bursitis   . Vitamin D deficiency     takes OTC Vit D  . DEPRESSION 09/23/2007    takes Lexapro daily but hasn't taken since Sept 2014  . Breast cancer     left  . Muscle spasms of head and/or neck   . Hemorrhoids   . Diarrhea   . Diabetes insipidus     takes Desmopressin bid    History   Social History  . Marital Status: Widowed    Spouse Name: N/A    Number of Children: N/A  . Years of Education: N/A   Occupational History  . Not on file.   Social History Main Topics  . Smoking status: Former Games developer  . Smokeless tobacco: Never Used     Comment: quit 62yrs ago  . Alcohol Use: Yes     Comment: socailly  . Drug Use: No  . Sexual Activity: Not on file   Other Topics Concern  . Not on file   Social History Narrative  . No narrative on file    Past Surgical History  Procedure Laterality Date  . Breast biopsy Left 53yrs ago  . Colonoscopy    . Breast lumpectomy with needle localization Left  62/09/2012    Procedure: PARTIAL MASTECTOMY WITH NEEDLE LOCALIZATION;  Surgeon: Almond Lint, MD;  Location: [facility] OR;  Service: General;  Laterality: Left;  1:00 NL at [facility]     Family History  Problem Relation Age of Onset  . Ovarian cancer Mother     No Known Allergies  Current Outpatient Prescriptions on File Prior to Visit  Medication Sig Dispense Refill  . atorvastatin (LIPITOR) 80 MG tablet Take 80 mg by mouth every evening.      . bimatoprost (LUMIGAN) 0.03 % ophthalmic drops Place 1 drop into both eyes at bedtime.       . Calcium Carbonate-Vitamin D (CALCIUM + D PO) Take 2 tablets by mouth daily. 600 mg /800 units      . cholecalciferol (VITAMIN D) 1000 UNITS tablet Take 2,000 Units by mouth daily.      . Clobetasol Prop Emollient Base (CLOBETASOL PROPIONATE E) 0.05 % emollient cream Apply 1 application topically daily as needed (for area on leg).       Marland Kitchen desmopressin (DDAVP) 0.01 % SOLN Place 1 spray into the nose 2 (two) times daily.       Marland Kitchen  lisinopril (PRINIVIL,ZESTRIL) 20 MG tablet Take 1 tablet (20 mg total) by mouth daily.  90 tablet  3  . potassium chloride SA (K-DUR,KLOR-CON) 20 MEQ tablet Take 1 tablet (20 mEq total) by mouth 2 (two) times daily.  90 tablet  6  . risedronate (ACTONEL) 150 MG tablet Take 150 mg by mouth every 30 (thirty) days. with water on empty stomach, nothing by mouth or lie down for next 30 minutes.       No current facility-administered medications on file prior to visit.    BP 120/78  Temp(Src) 98.4 F (36.9 C) (Oral)  Wt 202 lb (91.627 kg)       Review of Systems  Constitutional: Negative.   HENT: Negative for congestion, dental problem, hearing loss, rhinorrhea, sinus pressure, sore throat and tinnitus.   Eyes: Negative for pain, discharge and visual disturbance.  Respiratory: Negative for cough and shortness of breath.   Cardiovascular: Negative for chest pain, palpitations and leg swelling.  Gastrointestinal: Negative for nausea,  vomiting, abdominal pain, diarrhea, constipation, blood in stool and abdominal distention.  Genitourinary: Negative for dysuria, urgency, frequency, hematuria, flank pain, vaginal bleeding, vaginal discharge, difficulty urinating, vaginal pain and pelvic pain.  Musculoskeletal: Negative for arthralgias, gait problem and joint swelling.  Skin: Negative for rash.  Neurological: Negative for dizziness, syncope, speech difficulty, weakness, numbness and headaches.  Hematological: Negative for adenopathy.  Psychiatric/Behavioral: Negative for behavioral problems, dysphoric mood and agitation. The patient is not nervous/anxious.        Objective:   Physical Exam  Constitutional: She appears well-developed and well-nourished. No distress.  Blood pressure normal          Assessment & Plan:  Hyponatremia. Will treat with modest fluid restriction. The patient was told to let thirst  be her guide as far as fluid consumption- followup electrolytes with oncology next month

## 2013-01-05 ENCOUNTER — Ambulatory Visit: Admitting: Oncology

## 2013-01-06 ENCOUNTER — Telehealth: Payer: Self-pay | Admitting: Internal Medicine

## 2013-01-06 MED ORDER — OSELTAMIVIR PHOSPHATE 75 MG PO CAPS: 75.0000 mg | ORAL_CAPSULE | Freq: Two times a day (BID) | ORAL | Status: DC

## 2013-01-06 NOTE — Telephone Encounter (Signed)
Okay to call in a prescription for Tamiflu  #10 to take twice a day. No other recommendations other than medications for symptom control

## 2013-01-06 NOTE — Telephone Encounter (Signed)
Please advise 

## 2013-01-06 NOTE — Telephone Encounter (Signed)
Pt notified Rx for Tamiflu sent to pharmacy. 

## 2013-01-06 NOTE — Telephone Encounter (Signed)
Patient Information:  Caller Name: Nizhoni  Phone: [phone]  Patient: Chelsea Moreno, Chelsea Moreno  Gender: Female  DOB: [birth year]  Age: 62 Years  PCP: Eleonore Chiquito (Family Practice > 46yrs old)  Office Follow Up:  Does the office need to follow up with this patient?: Yes  Instructions For The Office: Patient declines appt. Call back parameters reviewed. Patient verbalizes understanding. Patient is requesting a Rx to be called into [facility] on [address] at [phone]. Please return call to patient at [phone]. Patient is verbalizing concern that Tamilfu may cause nausea and is inquiring about other medication options.  RN Note:  Patient states household family member had positive flu test 01/05/13. Patient states she developed chills, fever, per tactile, non-productive cough, sore throat, headache. Onset 01/05/13. Patient states she did not take her temperature initially prior to taking Tylenol. Patient states she had chills and sweating "during the night."  Patient last had Tylenol 1000mg . at 0800 01/06/13. States headache resolved after taking Tylenol. Temp. 99.9 orally at 1200 01/06/13. Patient states intermittent mild wheezing. States wheezing clears with coughing. Patient denies wheezing at present. Care advice given per guidelines. Patient declines appt. Call back parameters reviewed. Patient verbalizes understanding. Patient is requesting a Rx to be called into [facility] on [address] at [phone]. Please return call to patient at [phone]. Patient is verbalizing concern that Tamilfu may cause nausea and is inquiring about other medication options.  Symptoms  Reason For Call & Symptoms: Cough, headache, chills, nausea,  Reviewed Health History In EMR: Yes  Reviewed Medications In EMR: Yes  Reviewed Allergies In EMR: Yes  Reviewed Surgeries / Procedures: Yes  Date of Onset of Symptoms: 01/05/2013  Treatments Tried: Tylenol  Treatments Tried Worked: No  Any  Fever: Yes  Fever Taken: Tactile  Fever Time Of Reading: 00:00:00  Fever Last Reading: N/A  Guideline(s) Used:  Influenza - Seasonal  Disposition Per Guideline:   Discuss with PCP and Callback by Nurse within 1 Hour  Reason For Disposition Reached:   HIGH RISK (e.g., age > 64 years, pregnant, HIV+, chronic medical condition) and flu symptoms  Advice Given:  Treating the Symptoms of Flu  Fever, Muscle Aches, and Headache: For fever more than 101 F (38.3 C), muscle aches, and headaches, take acetaminophen every 4-6 hours (Adults 650 mg) OR ibuprofen every 6-8 hours (Adults 400-600 mg).  Sore Throat: Use throat lozenges, hard candy or warm chicken broth.  Cough: Use cough drops.  Hydrate: Drink extra liquids. If the air in your home is dry, use a humidifier.  Call Back If:  You become short of breath or worse.  For a Stuffy Nose - Use Nasal Washes:  Introduction: Saline (salt water) nasal irrigation (nasal wash) is an effective and simple home remedy for treating stuffy nose and sinus congestion. The nose can be irrigated by pouring, spraying, or squirting salt water into the nose and then letting it run back out.  Patient Will Follow Care Advice:  YES

## 2013-01-07 ENCOUNTER — Encounter: Payer: Self-pay | Admitting: Radiation Oncology

## 2013-01-07 DIAGNOSIS — C50919 Malignant neoplasm of unspecified site of unspecified female breast: Secondary | ICD-10-CM | POA: Insufficient documentation

## 2013-01-07 NOTE — Progress Notes (Signed)
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 Breast, partial mastectomy, Left - DUCTAL CARCINOMA IN SITU, SEE COMMENT. - IN SITU CARCINOMA IS 2 MM FROM THE NEAREST MARGIN (LATERAL) - SEE TUMOR SYNOPTIC TEMPLATE BELOW 2. Breast, excision, Left, additional inferior margin - BENIGN BREAST TISSUE, SEE COMMENT. - NEGATIVE FOR ATYPIA OR MALIGNANCY. - SURGICAL MARGINS, NEGATIVE FOR ATYPIA OR MALIGNANCY. 3. Breast, excision, New posterior margin - BENIGN BREAST TISSUE, SEE COMMENT. - NEGATIVE FOR ATYPIA OR MALIGNANCY. - SURGICAL MARGINS, NEGATIVE FOR ATYPIA OR MALIGNANCY. 4. Breast, excision, New lateral margin - BENIGN BREAST TISSUE, SEE COMMENT. - NEGATIVE FOR ATYPIA OR MALIGNANCY. - SURGICAL MARGINS, NEGATIVE FOR ATYPIA OR MALIGNANCY.  Receptor Status: ER(100%), PR (100%), Her2-neu ()  Did patient present with symptoms (if so, please note symptoms) or was this found on screening mammography?: screening mammogram; request 3D mammography be done this year because she "has always been called back in the past but, this time showed something."  Past/Anticipated interventions by surgeon, if any: 12/16/12 left partial mastectomy w/re-excisions   Past/Anticipated interventions by medical oncology, if any: Chemotherapy, Dr Welton Flakes: I will plan on seeing her back after the surgery for discussion of antiestrogen therapy further. FU w/Dr Welton Flakes 01/21/13.  Lymphedema issues, if any:  None  Pain issues, if any: Denies pain in her left breast but, does report her breast remains tender post surgery  SAFETY ISSUES:  Prior radiation? no  Pacemaker/ICD? no  Possible current pregnancy? no  Is the patient on methotrexate? 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, [place],  [place] 01/07/2013,9:14 AM

## 2013-01-13 ENCOUNTER — Encounter: Payer: Self-pay | Admitting: Radiation Oncology

## 2013-01-13 ENCOUNTER — Ambulatory Visit
Admission: RE | Admit: 2013-01-13 | Discharge: 2013-01-13 | Disposition: A | Discharge status: Home / Self Care | Source: Ambulatory Visit | Attending: Radiation Oncology | Admitting: Radiation Oncology

## 2013-01-13 VITALS — BP 140/67 | HR 66 | Temp 97.6°F | Resp 16 | Ht 61.0 in | Wt 199.1 lb

## 2013-01-13 DIAGNOSIS — C50912 Malignant neoplasm of unspecified site of left female breast: Secondary | ICD-10-CM

## 2013-01-13 DIAGNOSIS — C50312 Malignant neoplasm of lower-inner quadrant of left female breast: Secondary | ICD-10-CM

## 2013-01-13 DIAGNOSIS — D0590 Unspecified type of carcinoma in situ of unspecified breast: Secondary | ICD-10-CM | POA: Insufficient documentation

## 2013-01-13 NOTE — Progress Notes (Signed)
Complete PATIENT MEASURE OF DISTRESS worksheet with a score of 5 submitted to social work.

## 2013-01-13 NOTE — Progress Notes (Signed)
CC: Dr. Marcy Panning, Dr. 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  Chelsea Moreno returns today for review and scheduling of her left breast radiotherapy in the management of her DCIS. I first saw the patient at [facility] on 11/12/2012. At the time of a screening mammogram on 10/27/2012 she was noted to have heterogeneous calcifications within the left breast. Additional views showed calcifications at approximately 6:00, and she underwent a stereotactic biopsy on 11/05/2012 with a diagnosis of DCIS with calcifications and necrosis. The DCIS was ER positive and felt to be intermediate grade. Her staging MRI scan on November 7 showed post biopsy changes in the medial left breast with a small, 2 cm, hematoma at the biopsy site. On 12/17/2011 she underwent a left partial mastectomy. She underwent additional excision of the inferior margin, posterior margin and lateral margin with the samples been without evidence for residual DCIS. There were 3 pathologic areas grossly identified measured 1.5 cm, 1.2 cm and 1.5 cm. She is doing well postoperatively.  Physical examination: Alert and oriented. Filed Vitals:   01/13/13 1323  BP: 140/67  Pulse: 66  Temp: 97.6 F (36.4 C)  Resp: 16   Head and neck examination: Grossly unremarkable. Nodes: There is no palpable supraclavicular, or axillary lymphadenopathy. Breasts: She is large breasted. There is a partial mastectomy wound at approximately 7:00 along the left breast which is well-healed. No masses are appreciated. Extremities: Without edema.  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 I do recommend a pre-radiation therapy mammogram of the left breast to confirm removal of all suspicious microcalcifications. In view of her breast size, she is not a candidate for hypo-fractionated treatment. We discussed prone positioning if she is not comfortable to minimize skin toxicity, otherwise she  will be treated supine. We also discussed deep inspiration breath-hold technology in the supine position to avoid cardiac silhouette irradiation. We discussed the potential acute and late toxicities of radiation therapy. We can expect some increased skin toxicity based on her anatomy/breast size. Her prognosis is excellent. Lastly, we briefly discussed the B. 43 study which may or may not be open. She is not interested. Consent is signed today.  30 minutes was spent face-to-face with the patient, primarily counseling the patient and coordinating her care.

## 2013-01-13 NOTE — Progress Notes (Signed)
See progress note under physician encounter. 

## 2013-01-14 ENCOUNTER — Other Ambulatory Visit: Payer: Self-pay | Admitting: Internal Medicine

## 2013-01-15 ENCOUNTER — Other Ambulatory Visit: Payer: Self-pay | Admitting: Radiation Oncology

## 2013-01-15 ENCOUNTER — Ambulatory Visit: Admitting: Radiation Oncology

## 2013-01-15 ENCOUNTER — Telehealth: Payer: Self-pay | Admitting: *Deleted

## 2013-01-15 DIAGNOSIS — C50912 Malignant neoplasm of unspecified site of left female breast: Secondary | ICD-10-CM

## 2013-01-15 NOTE — Telephone Encounter (Signed)
Called patient to inform of mammogram and sim, spoke with patient and she is aware of these appts.

## 2013-01-20 ENCOUNTER — Ambulatory Visit
Admission: RE | Admit: 2013-01-20 | Discharge: 2013-01-20 | Disposition: A | Discharge status: Home / Self Care | Source: Ambulatory Visit | Attending: Radiation Oncology | Admitting: Radiation Oncology

## 2013-01-20 DIAGNOSIS — D0590 Unspecified type of carcinoma in situ of unspecified breast: Secondary | ICD-10-CM | POA: Insufficient documentation

## 2013-01-20 DIAGNOSIS — L589 Radiodermatitis, unspecified: Secondary | ICD-10-CM | POA: Insufficient documentation

## 2013-01-20 DIAGNOSIS — C50319 Malignant neoplasm of lower-inner quadrant of unspecified female breast: Secondary | ICD-10-CM

## 2013-01-20 DIAGNOSIS — Z510 Encounter for antineoplastic radiation therapy: Secondary | ICD-10-CM | POA: Insufficient documentation

## 2013-01-20 DIAGNOSIS — Y842 Radiological procedure and radiotherapy as the cause of abnormal reaction of the patient, or of later complication, without mention of misadventure at the time of the procedure: Secondary | ICD-10-CM | POA: Insufficient documentation

## 2013-01-20 NOTE — Progress Notes (Addendum)
Complex simulation/treatment planning note: The patient was taken to the CT simulator and placed prone on the prone breast board. She was scanned. It is noted that the breast did touch the tabletop, and a Styrofoam block was placed and she was rescanned. An isocenter was chosen. I contoured her tumor bed. She was set up the tangential fields, LPO, and RAO. I'm prescribing 4500 cGy 25 sessions. I requesting 3-D simulation with dose volume histograms for the heart, lungs, and also the target structures. She will then undergo a reduced field boost supine, presumably with a photons for a further 1000 cGy in 5 sessions to her tumor bed.

## 2013-01-21 ENCOUNTER — Encounter: Payer: Self-pay | Admitting: Oncology

## 2013-01-21 ENCOUNTER — Other Ambulatory Visit: Payer: Self-pay | Admitting: Internal Medicine

## 2013-01-21 ENCOUNTER — Telehealth: Payer: Self-pay | Admitting: Oncology

## 2013-01-21 ENCOUNTER — Ambulatory Visit (HOSPITAL_BASED_OUTPATIENT_CLINIC_OR_DEPARTMENT_OTHER): Admitting: Oncology

## 2013-01-21 VITALS — BP 126/77 | HR 71 | Temp 98.1°F | Resp 18 | Ht 61.0 in | Wt 203.4 lb

## 2013-01-21 DIAGNOSIS — D0590 Unspecified type of carcinoma in situ of unspecified breast: Secondary | ICD-10-CM

## 2013-01-21 DIAGNOSIS — C50319 Malignant neoplasm of lower-inner quadrant of unspecified female breast: Secondary | ICD-10-CM

## 2013-01-21 DIAGNOSIS — Z170 Estrogen receptor positive status [ER+]: Secondary | ICD-10-CM

## 2013-01-21 NOTE — Patient Instructions (Signed)

## 2013-01-21 NOTE — Telephone Encounter (Signed)
, °

## 2013-01-26 ENCOUNTER — Other Ambulatory Visit: Payer: Self-pay | Admitting: *Deleted

## 2013-01-26 ENCOUNTER — Encounter: Payer: Self-pay | Admitting: Radiation Oncology

## 2013-01-26 MED ORDER — IBANDRONATE SODIUM 150 MG PO TABS: 150.0000 mg | ORAL_TABLET | ORAL | Status: DC

## 2013-01-26 NOTE — Progress Notes (Signed)
3-D simulation note: The patient underwent 3-D simulation for treatment to her right breast. She was set up to tangential fields in the prone position. Dose fine histograms were obtained for the heart and lungs. 2 sets of multileaf collimators are designed to conform the field. I prescribing 4500 cGy 25 sessions utilizing 6 MV photons.

## 2013-01-26 NOTE — Progress Notes (Signed)
OFFICE PROGRESS NOTE  CC**  Chelsea Cowden, MD [address] [place] [address] Dr. Arloa Koh  Dr. Theda Sers  DIAGNOSIS: 63 year old female with new diagnosis of left breast cancer.   STAGE:  Cancer of lower-inner quadrant of female breast  Primary site: Breast (Left)  Staging method: AJCC 7th Edition  Clinical: Stage 0 (Tis, N0, cM0)  Summary: Stage 0 (Tis, N0, cM0)   PRIOR THERAPY: 1. screening mammogram that showed calcifications in the left breast measuring 2.0 cm at the 6:00 position. Patient underwent a stereotactic biopsy. 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 Tumor was ER positive PR positive. She is scheduled to have an MRI of the breasts on Friday. She has no family history of breast cancer  2. 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 Breast, partial mastectomy, Left  - DUCTAL CARCINOMA IN SITU, SEE COMMENT.  - IN SITU CARCINOMA IS 2 MM FROM THE NEAREST MARGIN (LATERAL)  - SEE TUMOR SYNOPTIC TEMPLATE BELOW  2. Breast, excision, Left, additional inferior margin  - BENIGN BREAST TISSUE, SEE COMMENT.  - NEGATIVE FOR ATYPIA OR MALIGNANCY.  - SURGICAL MARGINS, NEGATIVE FOR ATYPIA OR MALIGNANCY.  3. Breast, excision, New posterior margin  - BENIGN BREAST TISSUE, SEE COMMENT.  - NEGATIVE FOR ATYPIA OR MALIGNANCY.  - SURGICAL MARGINS, NEGATIVE FOR ATYPIA OR MALIGNANCY.  4. Breast, excision, New lateral margin  - BENIGN BREAST TISSUE, SEE COMMENT.  - NEGATIVE FOR ATYPIA OR MALIGNANCY.  - SURGICAL MARGINS, NEGATIVE FOR ATYPIA OR MALIGNANCY.  Receptor Status: ER(100%), PR (100%), Her2-neu ()    CURRENT THERAPY: Proceed with radiation therapy  INTERVAL HISTORY: Chelsea Moreno 63 y.o. female returns for followup visit after her surgery. She was seen by Dr. Arloa Koh as well on December 31. Plan is to  begin her on radiation therapy first. Her final pathology did reveal DCIS that was estrogen receptor positive. There was no evidence of invasive disease. She is doing well postoperatively.  MEDICAL HISTORY: Past Medical History  Diagnosis Date  . COLONIC POLYPS, HX OF 09/24/2008  . HYPERLIPIDEMIA 09/06/2006    takes Atorvasatin nightly  . OSTEOPOROSIS 09/24/2008    takes Actonel every 30days  . HYPERTENSION 09/06/2006    takes Lisinopril daily  . History of bronchitis     yrs ago  . History of migraine     last time many yrs ago  . Dizziness     when nauseated gets dizzy  . Bursitis   . Vitamin D deficiency     takes OTC Vit D  . DEPRESSION 09/23/2007    takes Lexapro daily but hasn't taken since Sept 2014  . Muscle spasms of head and/or neck   . Hemorrhoids   . Diarrhea   . Diabetes insipidus     takes Desmopressin bid  . Breast cancer 11/05/12    left    ALLERGIES:  has No Known Allergies.  MEDICATIONS:  Current Outpatient Prescriptions  Medication Sig Dispense Refill  . atorvastatin (LIPITOR) 80 MG tablet Take 80 mg by mouth every evening.      . bimatoprost (LUMIGAN) 0.03 % ophthalmic drops Place 1 drop into both eyes at bedtime.       . Calcium Carbonate-Vitamin D (CALCIUM + D PO) Take 2 tablets by mouth daily. 600 mg /800 units      . cholecalciferol (VITAMIN D)  1000 UNITS tablet Take 2,000 Units by mouth daily.      . Clobetasol Prop Emollient Base (CLOBETASOL PROPIONATE E) 0.05 % emollient cream Apply 1 application topically daily as needed (for area on leg).       Marland Kitchen desmopressin (DDAVP) 0.01 % SOLN use one spray twice daily  5 mL  3  . lisinopril (PRINIVIL,ZESTRIL) 20 MG tablet Take 1 tablet (20 mg total) by mouth daily.  90 tablet  3  . potassium chloride SA (K-DUR,KLOR-CON) 20 MEQ tablet Take 1 tablet (20 mEq total) by mouth 2 (two) times daily.  90 tablet  6  . risedronate (ACTONEL) 150 MG tablet Take 1 tablet by mouth every 30 days with water on empty stomach,  nothing by mouth or lie down for 30 minutes  3 tablet  3   No current facility-administered medications for this visit.    SURGICAL HISTORY:  Past Surgical History  Procedure Laterality Date  . Breast biopsy Left 72yrs ago  . Colonoscopy    . Breast lumpectomy with needle localization Left 12/16/2012    Procedure: PARTIAL MASTECTOMY WITH NEEDLE LOCALIZATION;  Surgeon: Stark Klein, MD;  Location: [facility];  Service: General;  Laterality: Left;  1:00 NL at [facility]:  A comprehensive review of systems was negative.     PHYSICAL EXAMINATION: Blood pressure 126/77, pulse 71, temperature 98.1 F (36.7 C), temperature source Oral, resp. rate 18, height $RemoveBe'5\' 1"'tzjZiEgHK$  (1.549 m), weight 203 lb 6.4 oz (92.262 kg). Body mass index is 38.45 kg/(m^2). ECOG PERFORMANCE STATUS: 0 - Asymptomatic   General appearance: alert, cooperative and appears stated age Lymph nodes: Cervical, supraclavicular, and axillary nodes normal. Resp: clear to auscultation bilaterally Back: symmetric, no curvature. ROM normal. No CVA tenderness. Cardio: regular rate and rhythm GI: soft, non-tender; bowel sounds normal; no masses,  no organomegaly Extremities: extremities normal, atraumatic, no cyanosis or edema Neurologic: Grossly normal   LABORATORY DATA: Lab Results  Component Value Date   WBC 3.6* 12/10/2012   HGB 13.9 12/16/2012   HCT 41.0 12/16/2012   MCV 86.1 12/10/2012   PLT 243 12/10/2012      Chemistry      Component Value Date/Time   NA 123* 12/16/2012 1536   NA 132* 11/12/2012 1249   K 4.5 12/16/2012 1536   K 4.2 11/12/2012 1249   CL 88* 12/10/2012 0935   CO2 28 12/10/2012 0935   CO2 27 11/12/2012 1249   BUN 6 12/10/2012 0935   BUN 9.9 11/12/2012 1249   CREATININE 0.50 12/10/2012 0935   CREATININE 0.7 11/12/2012 1249      Component Value Date/Time   CALCIUM 9.3 12/10/2012 0935   CALCIUM 10.0 11/12/2012 1249   ALKPHOS 85 11/12/2012 1249   ALKPHOS 80 09/10/2012 1136   AST 24 11/12/2012 1249   AST  24 09/10/2012 1136   ALT 26 11/12/2012 1249   ALT 26 09/10/2012 1136   BILITOT 1.10 11/12/2012 1249   BILITOT 0.9 09/10/2012 1136       RADIOGRAPHIC STUDIES:  No results found.  ASSESSMENT: 63 year old female with  #1 stage 0 (Tis N0 M0) DCIS of the left breast status post partial mastectomy. Patient did not have any invasive disease. The tumor was ER positive. She is a good candidate for adjuvant radiation therapy. She has been seen by Dr. Arloa Koh who is planning on beginning her treatment in the next few days.  #2 once patient completes radiation therapy we will  plan on starting her on adjuvant antiestrogen therapy with tamoxifen or an aromatase inhibitor. We discussed both of them. Risks and benefits of therapies were discussed with her.   PLAN:   #1 proceed with radiation therapy.  #2 I will see her back after completion of radiation   All questions were answered. The patient knows to call the clinic with any problems, questions or concerns. We can certainly see the patient much sooner if necessary.  I spent 20 minutes counseling the patient face to face. The total time spent in the appointment was 30 minutes.    Marcy Panning, MD Medical/Oncology [facility] [phone] (beeper) [phone] (Office)

## 2013-01-26 NOTE — Telephone Encounter (Signed)
Spoke to pt told her new Rx sent to pharmacy for Boniva 150 mg tablet, to replace Actonel due to insurance no longer covering. Pt verbalized understanding.

## 2013-01-27 ENCOUNTER — Other Ambulatory Visit: Payer: Self-pay | Admitting: *Deleted

## 2013-01-27 MED ORDER — IBANDRONATE SODIUM 150 MG PO TABS: 150.0000 mg | ORAL_TABLET | ORAL | Status: DC

## 2013-01-27 NOTE — Telephone Encounter (Signed)
Left detailed message Rx called into pharmacy. 

## 2013-01-28 ENCOUNTER — Telehealth: Payer: Self-pay | Admitting: Internal Medicine

## 2013-01-28 ENCOUNTER — Ambulatory Visit
Admission: RE | Admit: 2013-01-28 | Discharge: 2013-01-28 | Disposition: A | Discharge status: Home / Self Care | Source: Ambulatory Visit | Attending: Radiation Oncology | Admitting: Radiation Oncology

## 2013-01-28 DIAGNOSIS — C50319 Malignant neoplasm of lower-inner quadrant of unspecified female breast: Secondary | ICD-10-CM

## 2013-01-28 NOTE — Progress Notes (Signed)
Simulation verification note: The patient underwent simulation verification for treatment to her left breast in the prone position. Her isocenter is in good position and the multileaf collimators contoured the treatment volume appropriately.

## 2013-01-28 NOTE — Telephone Encounter (Signed)
I spoke to customer service at [facility] and they informed me that they are denying Boniva.  Generic regular Fosamax is a covered alternative.

## 2013-01-29 ENCOUNTER — Ambulatory Visit
Admission: RE | Admit: 2013-01-29 | Discharge: 2013-01-29 | Disposition: A | Discharge status: Home / Self Care | Source: Ambulatory Visit | Attending: Radiation Oncology | Admitting: Radiation Oncology

## 2013-01-29 MED ORDER — ALENDRONATE SODIUM 70 MG PO TABS: 70.0000 mg | ORAL_TABLET | ORAL | Status: DC

## 2013-01-29 NOTE — Telephone Encounter (Signed)
Spoke to pt told her I had to change medication again Chelsea Moreno is not covered so we had to change to Fosamax 70 mg one tablet once a week. New Rx sent to pharmacy. Pt verbalized understanding.

## 2013-01-30 ENCOUNTER — Ambulatory Visit
Admission: RE | Admit: 2013-01-30 | Discharge: 2013-01-30 | Disposition: A | Discharge status: Home / Self Care | Source: Ambulatory Visit | Attending: Radiation Oncology | Admitting: Radiation Oncology

## 2013-01-30 ENCOUNTER — Ambulatory Visit (INDEPENDENT_AMBULATORY_CARE_PROVIDER_SITE_OTHER): Admitting: Gastroenterology

## 2013-01-30 ENCOUNTER — Encounter: Payer: Self-pay | Admitting: Gastroenterology

## 2013-01-30 VITALS — BP 118/64 | HR 68 | Ht 61.5 in | Wt 204.0 lb

## 2013-01-30 DIAGNOSIS — R110 Nausea: Secondary | ICD-10-CM

## 2013-01-30 DIAGNOSIS — R197 Diarrhea, unspecified: Secondary | ICD-10-CM

## 2013-01-30 MED ORDER — MOVIPREP 100 G PO SOLR
1.0000 | Freq: Once | ORAL | Status: DC
Start: 2013-01-30 — End: 2013-03-06

## 2013-01-30 NOTE — Patient Instructions (Signed)
You will be set up for a colonoscopy for polyp surveillance, intermittent diarrhea. You will be set up for an upper endoscopy for intermittent nausea, diarrhea episodes.

## 2013-01-30 NOTE — Progress Notes (Signed)
[name] Clinical Social Work  Clinical Social Work was referred by distress screening protocol.  The patient scored a 5 on the Psychosocial Distress Thermometer which indicates moderate distress. Clinical Social Worker Intern phoned to assess for distress and other psychosocial needs. Patient did not answer phone.  CSWI left message to return call if further assistance was needed.   Clinical Social Worker follow up needed: no  If yes, follow up plan:   Chelsea Moreno S. [place] Work Intern [employer] [phone]

## 2013-01-30 NOTE — Progress Notes (Signed)
Review of pertinent gastrointestinal problems: 1. History of adenomatous polyp: colonoscopy 05/2007 [name] found hemorrhoids and single 27mm polyp that was TA on pathology  HPI: This is a   very pleasant 63 year old woman whom I last saw about 6 years ago.  Has recurrent problems of Nausea, vomiting, diarrhea.  Since 2009.    No real association with particular meals.    During this past summer, occurred several times.    Actually passed out in her bathroom once and hit her head.    Occurs at night usually, will awaken with signficant discomfort.  Tries to relax but doesn't help.  Will have diarrhea several times over night.  Never blood diarrhea.  Diarrhea can persist for a day.  Then the diarrhea is gone, in between episodes her bowels are fine usually.  pepto may help a bit.  Nausea is a big compenent.  No abd pains at that time.  She had an abdominal ultrasound this past summer and it suggested some small gallbladder polyps but nothing alarming. Recent labs show normal CBC, slightly low sodium.   Review of systems: Pertinent positive and negative review of systems were noted in the above HPI section. Complete review of systems was performed and was otherwise normal.    Past Medical History  Diagnosis Date  . COLONIC POLYPS, HX OF 09/24/2008  . HYPERLIPIDEMIA 09/06/2006    takes Atorvasatin nightly  . OSTEOPOROSIS 09/24/2008    takes Actonel every 30days  . HYPERTENSION 09/06/2006    takes Lisinopril daily  . History of bronchitis     yrs ago  . History of migraine     last time many yrs ago  . Dizziness     when nauseated gets dizzy  . Bursitis   . Vitamin D deficiency     takes OTC Vit D  . DEPRESSION 09/23/2007    takes Lexapro daily but hasn't taken since Sept 2014  . Muscle spasms of head and/or neck   . Hemorrhoids   . Diarrhea   . Diabetes insipidus     takes Desmopressin bid  . Breast cancer 11/05/12    left    Past Surgical History  Procedure Laterality  Date  . Breast biopsy Left 26yrs ago  . Colonoscopy    . Breast lumpectomy with needle localization Left 12/16/2012    Procedure: PARTIAL MASTECTOMY WITH NEEDLE LOCALIZATION;  Surgeon: Stark Klein, MD;  Location: [facility];  Service: General;  Laterality: Left;  1:00 NL at [facility]     Current Outpatient Prescriptions  Medication Sig Dispense Refill  . alendronate (FOSAMAX) 70 MG tablet Take 1 tablet (70 mg total) by mouth every 7 (seven) days. Take with a full glass of water on an empty stomach.  12 tablet  3  . atorvastatin (LIPITOR) 80 MG tablet Take 80 mg by mouth every evening.      . bimatoprost (LUMIGAN) 0.03 % ophthalmic drops Place 1 drop into both eyes at bedtime.       . Calcium Carbonate-Vitamin D (CALCIUM + D PO) Take 2 tablets by mouth daily. 600 mg /800 units      . cholecalciferol (VITAMIN D) 1000 UNITS tablet Take 2,000 Units by mouth daily.      . Clobetasol Prop Emollient Base (CLOBETASOL PROPIONATE E) 0.05 % emollient cream Apply 1 application topically daily as needed (for area on leg).       Marland Kitchen desmopressin (DDAVP) 0.01 % SOLN Place into the nose 2 (two) times daily. As  needed      . lisinopril (PRINIVIL,ZESTRIL) 20 MG tablet Take 1 tablet (20 mg total) by mouth daily.  90 tablet  3  . potassium chloride SA (K-DUR,KLOR-CON) 20 MEQ tablet Take 1 tablet (20 mEq total) by mouth 2 (two) times daily.  90 tablet  6   No current facility-administered medications for this visit.    Allergies as of 01/30/2013  . (No Known Allergies)    Family History  Problem Relation Age of Onset  . Ovarian cancer Mother     History   Social History  . Marital Status: Widowed    Spouse Name: N/A    Number of Children: N/A  . Years of Education: N/A   Occupational History  . Not on file.   Social History Main Topics  . Smoking status: Former Smoker    Quit date: 01/07/1973  . Smokeless tobacco: Never Used     Comment: quit 6yrs ago  . Alcohol Use: Yes     Comment: socailly  .  Drug Use: No  . Sexual Activity: Not Currently    Birth Control/ Protection: Post-menopausal     Comment: menarche age 47, first live birth age 37, menopause 2002, no HRT   Other Topics Concern  . Not on file   Social History Narrative  . No narrative on file       Physical Exam: BP 118/64  Pulse 68  Ht 5' 1.5" (1.562 m)  Wt 204 lb (92.534 kg)  BMI 37.93 kg/m2  SpO2 99% Constitutional: generally well-appearing Psychiatric: alert and oriented x3 Eyes: extraocular movements intact Mouth: oral pharynx moist, no lesions Neck: supple no lymphadenopathy Cardiovascular: heart regular rate and rhythm Lungs: clear to auscultation bilaterally Abdomen: soft, nontender, nondistended, no obvious ascites, no peritoneal signs, normal bowel sounds Extremities: no lower extremity edema bilaterally Skin: no lesions on visible extremities    Assessment and plan: 63 y.o. female with  intermittent episodes of diarrhea, nausea. Personal history of adenomatous polyps.  She is due for surveillance colonoscopy for her history of adenomatous polyps. I will likely proceed with biopsies to check for perhaps microscopic colitis. Perhaps she has overt colitis. If nothing clearly explain these episodes she's been having now proceeded same time to an upper endoscopy. Other possibilities include carcinoid syndrome I will leave testing for that until after the above workup.

## 2013-02-02 ENCOUNTER — Ambulatory Visit
Admission: RE | Admit: 2013-02-02 | Discharge: 2013-02-02 | Disposition: A | Discharge status: Home / Self Care | Source: Ambulatory Visit | Attending: Radiation Oncology | Admitting: Radiation Oncology

## 2013-02-02 VITALS — BP 139/82 | HR 59 | Temp 97.9°F | Ht 61.5 in | Wt 201.7 lb

## 2013-02-02 DIAGNOSIS — C50319 Malignant neoplasm of lower-inner quadrant of unspecified female breast: Secondary | ICD-10-CM

## 2013-02-02 MED ORDER — RADIAPLEXRX EX GEL
Freq: Once | CUTANEOUS | Status: AC
  Administered 2013-02-02: 18:00:00 via TOPICAL

## 2013-02-02 MED ORDER — ALRA NON-METALLIC DEODORANT (RAD-ONC)
1.0000 "application " | Freq: Once | TOPICAL | Status: AC
  Administered 2013-02-02: 1 via TOPICAL

## 2013-02-02 NOTE — Progress Notes (Signed)
Weekly Management Note:  Site: Left breast Current Dose:  540  cGy Projected Dose: 4500  cGy  Narrative: The patient is seen today for routine under treatment assessment. CBCT/MVCT images/port films were reviewed. The chart was reviewed.   She is without complaints today. Her set up has been difficult. We're using her breast clips to confirm coverage of her tumor bed, but the skin contours or not ideal or reproducible. We are set up on her chest wall daily. I spoke with dosimetry and we will see if we can get a more reproducible setup tomorrow. Another option would be for her to undergo repeat simulation.  Physical Examination:  Filed Vitals:   02/02/13 1750  BP: 139/82  Pulse: 59  Temp: 97.9 F (36.6 C)  .  Weight: 201 lb 11.2 oz (91.491 kg). There are no significant skin changes.  Impression: Tolerating radiation therapy well.  Plan: Continue radiation therapy as planned.

## 2013-02-02 NOTE — Progress Notes (Signed)
Chelsea Moreno has had 3 fractions to her left breast.  She denies pain and fatigue.  She reports that her skin is intact.  She was given the Radiation Therapy and You book and discussed side effects including pain, fatigue and skin changes.  She was given Alra deoderant and Radiaplex gel.  She was instructed to apply radiaplex in the morning and at bedtime.

## 2013-02-03 ENCOUNTER — Ambulatory Visit
Admission: RE | Admit: 2013-02-03 | Discharge: 2013-02-03 | Disposition: A | Discharge status: Home / Self Care | Source: Ambulatory Visit | Attending: Radiation Oncology | Admitting: Radiation Oncology

## 2013-02-04 ENCOUNTER — Ambulatory Visit
Admission: RE | Admit: 2013-02-04 | Discharge: 2013-02-04 | Disposition: A | Discharge status: Home / Self Care | Source: Ambulatory Visit | Attending: Radiation Oncology | Admitting: Radiation Oncology

## 2013-02-05 ENCOUNTER — Ambulatory Visit
Admission: RE | Admit: 2013-02-05 | Discharge: 2013-02-05 | Disposition: A | Discharge status: Home / Self Care | Source: Ambulatory Visit | Attending: Radiation Oncology | Admitting: Radiation Oncology

## 2013-02-06 ENCOUNTER — Ambulatory Visit
Admission: RE | Admit: 2013-02-06 | Discharge: 2013-02-06 | Disposition: A | Discharge status: Home / Self Care | Source: Ambulatory Visit | Attending: Radiation Oncology | Admitting: Radiation Oncology

## 2013-02-09 ENCOUNTER — Ambulatory Visit
Admission: RE | Admit: 2013-02-09 | Discharge: 2013-02-09 | Disposition: A | Discharge status: Home / Self Care | Source: Ambulatory Visit | Attending: Radiation Oncology | Admitting: Radiation Oncology

## 2013-02-09 VITALS — BP 143/82 | HR 67 | Temp 97.5°F | Ht 65.1 in | Wt 201.4 lb

## 2013-02-09 DIAGNOSIS — C50319 Malignant neoplasm of lower-inner quadrant of unspecified female breast: Secondary | ICD-10-CM

## 2013-02-09 NOTE — Progress Notes (Signed)
Weekly Management Note:  Site: Left breast Current Dose:  1440  cGy Projected Dose: 4500  cGy followed by left breast boost, 1000 cGy 5 sessions  Narrative: The patient is seen today for routine under treatment assessment. CBCT/MVCT images/port films were reviewed. The chart was reviewed.   She is without complaints today. She has Radioplex gel to use when necessary. Her treatment setup is more reproducible.  Physical Examination:  Filed Vitals:   02/09/13 1651  BP: 143/82  Pulse: 67  Temp: 97.5 F (36.4 C)  .  Weight: 201 lb 6.4 oz (91.354 kg). There are no significant skin changes along the left breast/axilla.  Impression: Tolerating radiation therapy well.  Plan: Continue radiation therapy as planned.

## 2013-02-09 NOTE — Progress Notes (Signed)
Chelsea Moreno has received 8 fractions to her left breast.  No voiced concerns.

## 2013-02-10 ENCOUNTER — Ambulatory Visit
Admission: RE | Admit: 2013-02-10 | Discharge: 2013-02-10 | Disposition: A | Discharge status: Home / Self Care | Source: Ambulatory Visit | Attending: Radiation Oncology | Admitting: Radiation Oncology

## 2013-02-11 ENCOUNTER — Ambulatory Visit
Admission: RE | Admit: 2013-02-11 | Discharge: 2013-02-11 | Disposition: A | Discharge status: Home / Self Care | Source: Ambulatory Visit | Attending: Radiation Oncology | Admitting: Radiation Oncology

## 2013-02-12 ENCOUNTER — Ambulatory Visit
Admission: RE | Admit: 2013-02-12 | Discharge: 2013-02-12 | Disposition: A | Discharge status: Home / Self Care | Source: Ambulatory Visit | Attending: Radiation Oncology | Admitting: Radiation Oncology

## 2013-02-13 ENCOUNTER — Ambulatory Visit
Admission: RE | Admit: 2013-02-13 | Discharge: 2013-02-13 | Disposition: A | Discharge status: Home / Self Care | Source: Ambulatory Visit | Attending: Radiation Oncology | Admitting: Radiation Oncology

## 2013-02-16 ENCOUNTER — Ambulatory Visit
Admission: RE | Admit: 2013-02-16 | Discharge: 2013-02-16 | Disposition: A | Discharge status: Home / Self Care | Source: Ambulatory Visit | Attending: Radiation Oncology | Admitting: Radiation Oncology

## 2013-02-16 VITALS — BP 151/66 | HR 62 | Temp 98.1°F | Ht 65.0 in | Wt 205.2 lb

## 2013-02-16 DIAGNOSIS — C50319 Malignant neoplasm of lower-inner quadrant of unspecified female breast: Secondary | ICD-10-CM

## 2013-02-16 NOTE — Progress Notes (Signed)
Weekly Management Note:  Site: Left breast Current Dose:  2340  cGy Projected Dose: 4500  cGy followed by left breast boost  Narrative: The patient is seen today for routine under treatment assessment. CBCT/MVCT images/port films were reviewed. The chart was reviewed.   Her setup is excellent. No complaints today. She has not started to use Radioplex gel.  Physical Examination:  Filed Vitals:   02/16/13 1644  BP: 151/66  Pulse: 62  Temp: 98.1 F (36.7 C)  .  Weight: 205 lb 3.2 oz (93.078 kg). There is mild erythema the breast with no areas of desquamation.  Impression: Tolerating radiation therapy well.  Plan: Continue radiation therapy as planned.

## 2013-02-16 NOTE — Progress Notes (Signed)
Chelsea Moreno has had 13 fractions to her left breast.  She denies pain and fatigue.  The skin on her left breast is intact.  She has not started using radiaplex gel yet.

## 2013-02-17 ENCOUNTER — Ambulatory Visit
Admission: RE | Admit: 2013-02-17 | Discharge: 2013-02-17 | Disposition: A | Discharge status: Home / Self Care | Source: Ambulatory Visit | Attending: Radiation Oncology | Admitting: Radiation Oncology

## 2013-02-18 ENCOUNTER — Ambulatory Visit
Admission: RE | Admit: 2013-02-18 | Discharge: 2013-02-18 | Disposition: A | Discharge status: Home / Self Care | Source: Ambulatory Visit | Attending: Radiation Oncology | Admitting: Radiation Oncology

## 2013-02-19 ENCOUNTER — Ambulatory Visit
Admission: RE | Admit: 2013-02-19 | Discharge: 2013-02-19 | Disposition: A | Discharge status: Home / Self Care | Source: Ambulatory Visit | Attending: Radiation Oncology | Admitting: Radiation Oncology

## 2013-02-19 ENCOUNTER — Ambulatory Visit: Admitting: Internal Medicine

## 2013-02-20 ENCOUNTER — Ambulatory Visit
Admission: RE | Admit: 2013-02-20 | Discharge: 2013-02-20 | Disposition: A | Discharge status: Home / Self Care | Source: Ambulatory Visit | Attending: Radiation Oncology | Admitting: Radiation Oncology

## 2013-02-23 ENCOUNTER — Ambulatory Visit
Admission: RE | Admit: 2013-02-23 | Discharge: 2013-02-23 | Disposition: A | Discharge status: Home / Self Care | Source: Ambulatory Visit | Attending: Radiation Oncology | Admitting: Radiation Oncology

## 2013-02-23 VITALS — BP 135/60 | HR 67 | Temp 97.9°F | Wt 197.6 lb

## 2013-02-23 DIAGNOSIS — C50319 Malignant neoplasm of lower-inner quadrant of unspecified female breast: Secondary | ICD-10-CM

## 2013-02-23 NOTE — Progress Notes (Signed)
Weekly Management Note:  Site: Breast Current Dose:  3240  cGy Projected Dose: 4500  cGy  Narrative: The patient is seen today for routine under treatment assessment. CBCT/MVCT images/port films were reviewed. The chart was reviewed.   She is without complaints today except for pruritus along her left axilla. She has been using Radioplex when necessary but is only  for a short while.  Physical Examination:  Filed Vitals:   02/23/13 1645  BP: 135/60  Pulse: 67  Temp: 97.9 F (36.6 C)  .  Weight: 197 lb 9.6 oz (89.631 kg). There is a 3-4 cm area of erythema along the left axilla which may represent radiation dermatitis. There is no desquamation. There is mild to moderate erythema of the remaining breast.  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 I doubt that this is a fungal reaction. She may use hydrocortisone cream for the next few days.  Plan: Continue radiation therapy as planned.

## 2013-02-23 NOTE — Progress Notes (Signed)
Patient for weekly assessment of radiation to left breast.Has some mild redness with moist rash of left axilla and follicular rash of left mammary fold with itching.continue radiaplex but may apply hydrocortisone 1% for itching.Generalized fatigue unchanged from prior to starting radiation.

## 2013-02-24 ENCOUNTER — Ambulatory Visit
Admission: RE | Admit: 2013-02-24 | Discharge: 2013-02-24 | Disposition: A | Discharge status: Home / Self Care | Source: Ambulatory Visit | Attending: Radiation Oncology | Admitting: Radiation Oncology

## 2013-02-25 ENCOUNTER — Ambulatory Visit

## 2013-02-25 ENCOUNTER — Encounter (INDEPENDENT_AMBULATORY_CARE_PROVIDER_SITE_OTHER): Payer: Self-pay | Admitting: General Surgery

## 2013-02-26 ENCOUNTER — Ambulatory Visit
Admission: RE | Admit: 2013-02-26 | Discharge: 2013-02-26 | Disposition: A | Discharge status: Home / Self Care | Source: Ambulatory Visit | Attending: Radiation Oncology | Admitting: Radiation Oncology

## 2013-02-26 ENCOUNTER — Ambulatory Visit

## 2013-02-27 ENCOUNTER — Ambulatory Visit
Admission: RE | Admit: 2013-02-27 | Discharge: 2013-02-27 | Disposition: A | Discharge status: Home / Self Care | Source: Ambulatory Visit | Attending: Radiation Oncology | Admitting: Radiation Oncology

## 2013-03-02 ENCOUNTER — Ambulatory Visit
Admission: RE | Admit: 2013-03-02 | Discharge: 2013-03-02 | Disposition: A | Discharge status: Home / Self Care | Source: Ambulatory Visit | Attending: Radiation Oncology | Admitting: Radiation Oncology

## 2013-03-02 VITALS — BP 137/72 | HR 62 | Temp 98.2°F | Ht 65.0 in | Wt 202.4 lb

## 2013-03-02 DIAGNOSIS — C50319 Malignant neoplasm of lower-inner quadrant of unspecified female breast: Secondary | ICD-10-CM

## 2013-03-02 MED ORDER — BIAFINE EX EMUL
Freq: Two times a day (BID) | CUTANEOUS | Status: DC
  Administered 2013-03-02: 19:00:00 via TOPICAL

## 2013-03-02 NOTE — Addendum Note (Signed)
Encounter addended by: Jacqulyn Liner, RN on: 03/02/2013  7:18 PM<BR>     Documentation filed: Inpatient MAR

## 2013-03-02 NOTE — Progress Notes (Signed)
Chelsea Moreno has had 22 fractions to her left breast.  She denies pain and fatigue.  The skin on her left breast and underarm is red with a scattered rash.  She says it itches.  She is using radiaplex and hydrocortisone cream.  She tried Claritin last night and was not sure if it helped with the itching.

## 2013-03-02 NOTE — Progress Notes (Signed)
Weekly Management Note:  Site: Left breast Current Dose:  3960  cGy Projected Dose: 4500  cGy  Narrative: The patient is seen today for routine under treatment assessment. CBCT/MVCT images/port films were reviewed. The chart was reviewed.   She is bothered by more left breast discomfort and moderate to severe pruritus. She has been using Radioplex gel and also hydrocortisone cream. She has tried Claritin during the day.  Physical Examination:  Filed Vitals:   03/02/13 1642  BP: 137/72  Pulse: 62  Temp: 98.2 F (36.8 C)  .  Weight: 202 lb 6.4 oz (91.808 kg). There is moderate erythema the skin along the left breast, particularly the inframammary area region and axilla. There is dry desquamation but no areas of moist desquamation.  Impression: Tolerating radiation therapy well, although she does have moderate radiation dermatitis as expected. She will try Biafine cream or continue with hydrocortisone cream. She may take Benadryl at night.  Plan: Continue radiation therapy as planned.

## 2013-03-03 ENCOUNTER — Ambulatory Visit
Admission: RE | Admit: 2013-03-03 | Discharge: 2013-03-03 | Disposition: A | Discharge status: Home / Self Care | Source: Ambulatory Visit | Attending: Radiation Oncology | Admitting: Radiation Oncology

## 2013-03-03 ENCOUNTER — Encounter: Payer: Self-pay | Admitting: Radiation Oncology

## 2013-03-03 DIAGNOSIS — C50319 Malignant neoplasm of lower-inner quadrant of unspecified female breast: Secondary | ICD-10-CM

## 2013-03-03 NOTE — Progress Notes (Signed)
Complex simulation note: The patient was taken to the CT simulator. A VAC LOC immobilization device was constructed for immobilization. Her left partial mastectomy scar was marked with a radiopaque wire.  Rolled gauze was placed along the inframammary fold. She was then scanned. I contoured her tumor bed. She'll now undergo treatment planning to deliver a further 1000 cGy in 5 sessions, presumably utilizing a three-field technique. I'm also requesting optical guidance.

## 2013-03-04 ENCOUNTER — Ambulatory Visit
Admission: RE | Admit: 2013-03-04 | Discharge: 2013-03-04 | Disposition: A | Discharge status: Home / Self Care | Source: Ambulatory Visit | Attending: Radiation Oncology | Admitting: Radiation Oncology

## 2013-03-05 ENCOUNTER — Ambulatory Visit

## 2013-03-05 ENCOUNTER — Encounter: Payer: Self-pay | Admitting: Radiation Oncology

## 2013-03-05 NOTE — Progress Notes (Signed)
Simulation/treatment planning note: The patient completed her treatment planning today for her left breast boost. She said 33 field technique with tangential 15 MV photons and 6 MV photons. 3 separate multileaf collimators are designed to conform the field. I prescribing 1000 cGy 5 sessions. An isodose plan is requested and accepted.

## 2013-03-06 ENCOUNTER — Ambulatory Visit (AMBULATORY_SURGERY_CENTER): Admitting: Gastroenterology

## 2013-03-06 ENCOUNTER — Encounter: Payer: Self-pay | Admitting: Gastroenterology

## 2013-03-06 ENCOUNTER — Ambulatory Visit: Admission: RE | Admit: 2013-03-06 | Source: Ambulatory Visit

## 2013-03-06 VITALS — BP 144/70 | HR 56 | Temp 98.6°F | Resp 15 | Ht 61.0 in | Wt 204.0 lb

## 2013-03-06 DIAGNOSIS — K2970 Gastritis, unspecified, without bleeding: Secondary | ICD-10-CM

## 2013-03-06 DIAGNOSIS — K2990 Gastroduodenitis, unspecified, without bleeding: Secondary | ICD-10-CM

## 2013-03-06 DIAGNOSIS — R112 Nausea with vomiting, unspecified: Secondary | ICD-10-CM

## 2013-03-06 DIAGNOSIS — R197 Diarrhea, unspecified: Secondary | ICD-10-CM

## 2013-03-06 DIAGNOSIS — R110 Nausea: Secondary | ICD-10-CM

## 2013-03-06 DIAGNOSIS — Z86010 Personal history of colonic polyps: Secondary | ICD-10-CM

## 2013-03-06 MED ORDER — SODIUM CHLORIDE 0.9 % IV SOLN: 500.0000 mL | INTRAVENOUS | Status: DC

## 2013-03-06 NOTE — Op Note (Signed)
[place]  [address] [place], [address]   ENDOSCOPY PROCEDURE REPORT  PATIENT: Farrie, Sann  MR#: [id number] BIRTHDATE: 1950/05/22 , 7  yrs. old GENDER: Female ENDOSCOPIST: Milus Banister, MD PROCEDURE DATE:  03/06/2013 PROCEDURE:  EGD w/ biopsy ASA CLASS:     Class II INDICATIONS:  intermittent nausea, vomiting. MEDICATIONS: Propofol (Diprivan) 130 mg IV TOPICAL ANESTHETIC: none  DESCRIPTION OF PROCEDURE: After the risks benefits and alternatives of the procedure were thoroughly explained, informed consent was obtained.  The LB [id number] endoscope was introduced through the mouth and advanced to the second portion of the duodenum. Without limitations.  The instrument was slowly withdrawn as the mucosa was fully examined.     There was mild to moderate, non-specific distal gastritis.  This was biopsied and sent to pathology.  The examination was otherwise normal.  Retroflexed views revealed no abnormalities.     The scope was then withdrawn from the patient and the procedure completed.  COMPLICATIONS: There were no complications. ENDOSCOPIC IMPRESSION: There was mild to moderate, non-specific distal gastritis.  This was biopsied and sent to pathology.  The examination was otherwise normal.  RECOMMENDATIONS: Await final pathology results.  If H. pylori +, will start on appropriate antibiotics.  If not, will consider further GI testing but waiting until you are through with radiation therapy for breast cancer is probably a good idea.   eSigned:  Milus Banister, MD 03/06/2013 2:49 PM   CC:  Bluford Kaufmann, MD

## 2013-03-06 NOTE — Op Note (Signed)
[place]  [address]. [place], [address]   COLONOSCOPY PROCEDURE REPORT  PATIENT: Chelsea Moreno, Chelsea Moreno  MR#: [id number] BIRTHDATE: [birth year] , 32  yrs. old GENDER: Female ENDOSCOPIST: Milus Banister, MD PROCEDURE DATE:  03/06/2013 PROCEDURE:   Colonoscopy, surveillance First Screening Colonoscopy - Avg.  risk and is 50 yrs.  old or older - No.  Prior Negative Screening - Now for repeat screening. N/A  History of Adenoma - Now for follow-up colonoscopy & has been > or = to 3 yrs.  Yes hx of adenoma.  Has been 3 or more years since last colonoscopy.  Polyps Removed Today? No.  Recommend repeat exam, <10 yrs? No. ASA CLASS:   Class II INDICATIONS:history of colon polyp (single 4-68mm adenoma removed 2009) MEDICATIONS: Propofol (Diprivan) 240 mg IV and MAC sedation, administered by CRNA  DESCRIPTION OF PROCEDURE:   After the risks benefits and alternatives of the procedure were thoroughly explained, informed consent was obtained.  A digital rectal exam revealed no abnormalities of the rectum.   The LB [id number]  endoscope was introduced through the anus and advanced to the terminal ileum which was intubated for a short distance. No adverse events experienced.   The quality of the prep was excellent.  The instrument was then slowly withdrawn as the colon was fully examined.  COLON FINDINGS: The mucosa appeared normal in the terminal ileum. A normal appearing cecum, ileocecal valve, and appendiceal orifice were identified.  The ascending, hepatic flexure, transverse, splenic flexure, descending, sigmoid colon and rectum appeared unremarkable.  No polyps or cancers were seen.  Retroflexed views revealed no abnormalities. The time to cecum=3 minutes 06 seconds. Withdrawal time=10 minutes 09 seconds.  The scope was withdrawn and the procedure completed. COMPLICATIONS: There were no complications.  ENDOSCOPIC IMPRESSION: 1.   Normal mucosa in the terminal  ileum 2.   Normal colon; no polyps or cancers  RECOMMENDATIONS: You should continue to follow colorectal cancer screening guidelines for "routine risk" patients with a repeat colonoscopy in 10 years.    eSigned:  Milus Banister, MD 03/06/2013 2:38 PM   cc: Bluford Kaufmann, MD

## 2013-03-06 NOTE — Patient Instructions (Signed)
YOU HAD AN ENDOSCOPIC PROCEDURE TODAY AT THE [facility]: Refer to the procedure report that was given to you for any specific questions about what was found during the examination.  If the procedure report does not answer your questions, please call your gastroenterologist to clarify.  If you requested that your care partner not be given the details of your procedure findings, then the procedure report has been included in a sealed envelope for you to review at your convenience later.  YOU SHOULD EXPECT: Some feelings of bloating in the abdomen. Passage of more gas than usual.  Walking can help get rid of the air that was put into your GI tract during the procedure and reduce the bloating. If you had a lower endoscopy (such as a colonoscopy or flexible sigmoidoscopy) you may notice spotting of blood in your stool or on the toilet paper. If you underwent a bowel prep for your procedure, then you may not have a normal bowel movement for a few days.  DIET: Your first meal following the procedure should be a light meal and then it is ok to progress to your normal diet.  A half-sandwich or bowl of soup is an example of a good first meal.  Heavy or fried foods are harder to digest and may make you feel nauseous or bloated.  Likewise meals heavy in dairy and vegetables can cause extra gas to form and this can also increase the bloating.  Drink plenty of fluids but you should avoid alcoholic beverages for 24 hours.  ACTIVITY: Your care partner should take you home directly after the procedure.  You should plan to take it easy, moving slowly for the rest of the day.  You can resume normal activity the day after the procedure however you should NOT DRIVE or use heavy machinery for 24 hours (because of the sedation medicines used during the test).    SYMPTOMS TO REPORT IMMEDIATELY: A gastroenterologist can be reached at any hour.  During normal business hours, 8:30 AM to 5:00 PM Monday through Friday,  call [phone].  After hours and on weekends, please call the GI answering service at [phone] who will take a message and have the physician on call contact you.   Following lower endoscopy (colonoscopy or flexible sigmoidoscopy):  Excessive amounts of blood in the stool  Significant tenderness or worsening of abdominal pains  Swelling of the abdomen that is new, acute  Fever of 100F or higher  Following upper endoscopy (EGD)  Vomiting of blood or coffee ground material  New chest pain or pain under the shoulder blades  Painful or persistently difficult swallowing  New shortness of breath  Fever of 100F or higher  Black, tarry-looking stools  FOLLOW UP: If any biopsies were taken you will be contacted by phone or by letter within the next 1-3 weeks.  Call your gastroenterologist if you have not heard about the biopsies in 3 weeks.  Our staff will call the home number listed on your records the next business day following your procedure to check on you and address any questions or concerns that you may have at that time regarding the information given to you following your procedure. This is a courtesy call and so if there is no answer at the home number and we have not heard from you through the emergency physician on call, we will assume that you have returned to your regular daily activities without incident.  SIGNATURES/CONFIDENTIALITY: You and/or your care   partner have signed paperwork which will be entered into your electronic medical record.  These signatures attest to the fact that that the information above on your After Visit Summary has been reviewed and is understood.  Full responsibility of the confidentiality of this discharge information lies with you and/or your care-partner.  Gastritis information given.  Dr. [name] will advise you about your biospsy results either by phone or letter.  He will determine if you will need further treatment after biopsy results  are reviewed.  Normal colonoscopy, repeat in 10 years-2025

## 2013-03-06 NOTE — Progress Notes (Signed)
A/ox3 pleased with MAC, report to Jane RN 

## 2013-03-06 NOTE — Progress Notes (Signed)
Called to room to assist during endoscopic procedure.  Patient ID and intended procedure confirmed with present staff. Received instructions for my participation in the procedure from the performing physician.  

## 2013-03-09 ENCOUNTER — Ambulatory Visit

## 2013-03-09 ENCOUNTER — Ambulatory Visit
Admission: RE | Admit: 2013-03-09 | Discharge: 2013-03-09 | Disposition: A | Discharge status: Home / Self Care | Source: Ambulatory Visit | Attending: Radiation Oncology | Admitting: Radiation Oncology

## 2013-03-09 ENCOUNTER — Telehealth: Payer: Self-pay | Admitting: *Deleted

## 2013-03-09 VITALS — BP 141/72 | HR 70 | Temp 98.1°F | Wt 200.8 lb

## 2013-03-09 DIAGNOSIS — C50319 Malignant neoplasm of lower-inner quadrant of unspecified female breast: Secondary | ICD-10-CM

## 2013-03-09 NOTE — Telephone Encounter (Signed)
  Follow up Call-  Call back number 03/06/2013  Post procedure Call Back phone  # [phone]  Permission to leave phone message Yes     Patient questions:  Do you have a fever, pain , or abdominal swelling? no Pain Score  0 *  Have you tolerated food without any problems? yes  Have you been able to return to your normal activities? yes  Do you have any questions about your discharge instructions: Diet   no Medications  no Follow up visit  no  Do you have questions or concerns about your Care? no  Actions: * If pain score is 4 or above: No action needed, pain <4.

## 2013-03-09 NOTE — Progress Notes (Signed)
Weekly Management Note:  Site: Left breast Current Dose:  4500  cGy Projected Dose: 4500  cGy followed by 1 week boost Narrative: The patient is seen today for routine under treatment assessment. CBCT/MVCT images/port films were reviewed. The chart was reviewed.   She continues to have discomfort along her inframammary region and left axilla as expected. She uses Biafine cream.  Physical Examination:  Filed Vitals:   03/09/13 1641  BP: 141/72  Pulse: 70  Temp: 98.1 F (36.7 C)  .  Weight: 200 lb 12.8 oz (91.082 kg). There is moderate erythema along her left axilla and inframammary region with areas of dry desquamation. No areas of moist desquamation.  Impression: Tolerating radiation therapy well.  Plan: Continue radiation therapy as planned.

## 2013-03-09 NOTE — Progress Notes (Signed)
Patient for weekly assessment of radiation to left breast.Completed 25 of 25  Treatments.to start boost tomorrow.Increased redness and discomfort improved with biafine.Increased fatigue which started today.Contine application of biafine.

## 2013-03-10 ENCOUNTER — Ambulatory Visit
Admission: RE | Admit: 2013-03-10 | Discharge: 2013-03-10 | Disposition: A | Discharge status: Home / Self Care | Source: Ambulatory Visit | Attending: Radiation Oncology | Admitting: Radiation Oncology

## 2013-03-10 ENCOUNTER — Encounter: Payer: Self-pay | Admitting: Radiation Oncology

## 2013-03-10 NOTE — Progress Notes (Signed)
Simulation verification note: The patient underwent simulation verification for her left breast boost. Her isocenter is in good position and the multileaf collimators contoured the treatment volume appropriately .

## 2013-03-11 ENCOUNTER — Ambulatory Visit
Admission: RE | Admit: 2013-03-11 | Discharge: 2013-03-11 | Disposition: A | Discharge status: Home / Self Care | Source: Ambulatory Visit | Attending: Radiation Oncology | Admitting: Radiation Oncology

## 2013-03-12 ENCOUNTER — Encounter: Payer: Self-pay | Admitting: Family Medicine

## 2013-03-12 ENCOUNTER — Ambulatory Visit

## 2013-03-12 ENCOUNTER — Ambulatory Visit (INDEPENDENT_AMBULATORY_CARE_PROVIDER_SITE_OTHER): Admitting: Family Medicine

## 2013-03-12 ENCOUNTER — Ambulatory Visit
Admission: RE | Admit: 2013-03-12 | Discharge: 2013-03-12 | Disposition: A | Discharge status: Home / Self Care | Source: Ambulatory Visit | Attending: Radiation Oncology | Admitting: Radiation Oncology

## 2013-03-12 VITALS — BP 118/84 | Temp 98.0°F | Wt 196.0 lb

## 2013-03-12 DIAGNOSIS — J040 Acute laryngitis: Secondary | ICD-10-CM

## 2013-03-12 NOTE — Progress Notes (Signed)
Pre visit review using our clinic review tool, if applicable. No additional management support is needed unless otherwise documented below in the visit note. 

## 2013-03-12 NOTE — Progress Notes (Signed)
Chief Complaint  Patient presents with  . Sore Throat    cough at times     HPI:  -started: 4 days ago -symptoms:nasal congestion, sore throat, cough, hoarseness, feels like something in throat -denies:fever, SOB, NVD, tooth pain, sinus pain -sick contacts/travel/risks: denies flu or strep exposure or Ebola risks; son with similar symptoms  ROS: See pertinent positives and negatives per HPI.  Past Medical History  Diagnosis Date  . COLONIC POLYPS, HX OF 09/24/2008  . HYPERLIPIDEMIA 09/06/2006    takes Atorvasatin nightly  . OSTEOPOROSIS 09/24/2008    takes Actonel every 30days  . HYPERTENSION 09/06/2006    takes Lisinopril daily  . History of bronchitis     yrs ago  . History of migraine     last time many yrs ago  . Dizziness     when nauseated gets dizzy  . Bursitis   . Vitamin D deficiency     takes OTC Vit D  . DEPRESSION 09/23/2007    takes Lexapro daily but hasn't taken since Sept 2014  . Muscle spasms of head and/or neck   . Hemorrhoids   . Diarrhea   . Diabetes insipidus     takes Desmopressin bid  . Breast cancer 11/05/12    left    Past Surgical History  Procedure Laterality Date  . Breast biopsy Left 73yrs ago  . Colonoscopy    . Breast lumpectomy with needle localization Left 12/16/2012    Procedure: PARTIAL MASTECTOMY WITH NEEDLE LOCALIZATION;  Surgeon: Stark Klein, MD;  Location: [facility];  Service: General;  Laterality: Left;  1:00 NL at [facility]     Family History  Problem Relation Age of Onset  . Ovarian cancer Mother     History   Social History  . Marital Status: Widowed    Spouse Name: N/A    Number of Children: N/A  . Years of Education: N/A   Social History Main Topics  . Smoking status: Former Smoker    Quit date: 01/07/1973  . Smokeless tobacco: Never Used     Comment: quit 33yrs ago  . Alcohol Use: Yes     Comment: socailly  . Drug Use: No  . Sexual Activity: Not Currently    Birth Control/ Protection: Post-menopausal   Comment: menarche age 48, first live birth age 75, menopause 2002, no HRT   Other Topics Concern  . None   Social History Narrative  . None    Current outpatient prescriptions:alendronate (FOSAMAX) 70 MG tablet, Take 1 tablet (70 mg total) by mouth every 7 (seven) days. Take with a full glass of water on an empty stomach., Disp: 12 tablet, Rfl: 3;  atorvastatin (LIPITOR) 80 MG tablet, Take 80 mg by mouth every evening., Disp: , Rfl: ;  bimatoprost (LUMIGAN) 0.03 % ophthalmic drops, Place 1 drop into both eyes at bedtime. , Disp: , Rfl:  Calcium Carbonate-Vitamin D (CALCIUM + D PO), Take 2 tablets by mouth daily. 600 mg /800 units, Disp: , Rfl: ;  cholecalciferol (VITAMIN D) 1000 UNITS tablet, Take 2,000 Units by mouth daily., Disp: , Rfl: ;  Clobetasol Prop Emollient Base (CLOBETASOL PROPIONATE E) 0.05 % emollient cream, Apply 1 application topically daily as needed (for area on leg). , Disp: , Rfl:  desmopressin (DDAVP) 0.01 % SOLN, Place into the nose 2 (two) times daily. As needed, Disp: , Rfl: ;  hyaluronate sodium (RADIAPLEXRX) GEL, Apply 1 application topically 2 (two) times daily., Disp: , Rfl: ;  hydrocortisone cream  0.5 %, Apply 1 application topically as needed for itching., Disp: , Rfl: ;  lisinopril (PRINIVIL,ZESTRIL) 20 MG tablet, Take 1 tablet (20 mg total) by mouth daily., Disp: 90 tablet, Rfl: 3 Loratadine (CLARITIN PO), Take 1 tablet by mouth as needed., Disp: , Rfl: ;  potassium chloride SA (K-DUR,KLOR-CON) 20 MEQ tablet, Take 1 tablet (20 mEq total) by mouth 2 (two) times daily., Disp: 90 tablet, Rfl: 6  EXAM:  Filed Vitals:   03/12/13 1510  BP: 118/84  Temp: 98 F (36.7 C)    Body mass index is 37.05 kg/(m^2).  GENERAL: vitals reviewed and listed above, alert, oriented, appears well hydrated and in no acute distress  HEENT: atraumatic, conjunttiva clear, no obvious abnormalities on inspection of external nose and ears, normal appearance of ear canals and TMs, clear  nasal congestion, mild post oropharyngeal erythema with PND, no tonsillar edema or exudate, no sinus TTP  NECK: no obvious masses on inspection  LUNGS: clear to auscultation bilaterally, no wheezes, rales or rhonchi, good air movement  CV: HRRR, no peripheral edema  MS: moves all extremities without noticeable abnormality  PSYCH: pleasant and cooperative, no obvious depression or anxiety  ASSESSMENT AND PLAN:  Discussed the following assessment and plan:  Laryngitis  --we discussed possible serious and likely etiologies, workup and treatment, treatment risks and return precautions -after this discussion, Anjolina opted for supportive care for likely viral illness -of course, we advised Novalynn  to return or notify a doctor immediately if symptoms worsen or persist or new concerns arise.  .  -of course, we advised to return or notify a doctor immediately if symptoms worsen or persist or new concerns arise.    There are no Patient Instructions on file for this visit.   Colin Benton R.

## 2013-03-13 ENCOUNTER — Ambulatory Visit
Admission: RE | Admit: 2013-03-13 | Discharge: 2013-03-13 | Disposition: A | Discharge status: Home / Self Care | Source: Ambulatory Visit | Attending: Radiation Oncology | Admitting: Radiation Oncology

## 2013-03-13 ENCOUNTER — Encounter: Payer: Self-pay | Admitting: Gastroenterology

## 2013-03-13 ENCOUNTER — Ambulatory Visit

## 2013-03-16 ENCOUNTER — Ambulatory Visit (HOSPITAL_BASED_OUTPATIENT_CLINIC_OR_DEPARTMENT_OTHER): Admitting: Oncology

## 2013-03-16 ENCOUNTER — Ambulatory Visit

## 2013-03-16 ENCOUNTER — Ambulatory Visit
Admission: RE | Admit: 2013-03-16 | Discharge: 2013-03-16 | Disposition: A | Discharge status: Home / Self Care | Source: Ambulatory Visit | Attending: Radiation Oncology | Admitting: Radiation Oncology

## 2013-03-16 ENCOUNTER — Encounter: Payer: Self-pay | Admitting: Radiation Oncology

## 2013-03-16 ENCOUNTER — Encounter: Payer: Self-pay | Admitting: Oncology

## 2013-03-16 VITALS — BP 146/83 | HR 78 | Temp 98.0°F | Resp 18 | Ht 61.0 in | Wt 197.7 lb

## 2013-03-16 VITALS — BP 134/79 | HR 71 | Temp 98.2°F | Resp 20 | Wt 198.2 lb

## 2013-03-16 DIAGNOSIS — C50319 Malignant neoplasm of lower-inner quadrant of unspecified female breast: Secondary | ICD-10-CM

## 2013-03-16 DIAGNOSIS — D0590 Unspecified type of carcinoma in situ of unspecified breast: Secondary | ICD-10-CM

## 2013-03-16 DIAGNOSIS — Z170 Estrogen receptor positive status [ER+]: Secondary | ICD-10-CM

## 2013-03-16 MED ORDER — TAMOXIFEN CITRATE 20 MG PO TABS: 20.0000 mg | ORAL_TABLET | Freq: Every day | ORAL | Status: AC

## 2013-03-16 NOTE — Progress Notes (Signed)
Weekly Management Note:  Site: Left breast boost Current Dose:  1000  cGy Projected Dose: 1000  cGy (cumulative dose 5500 cGy)  Narrative: The patient is seen today for routine under treatment assessment. CBCT/MVCT images/port films were reviewed. The chart was reviewed.   She is without new complaints today. She uses Biafine cream.  Physical Examination:  Filed Vitals:   03/16/13 1133  BP: 134/79  Pulse: 71  Temp: 98.2 F (36.8 C)  Resp: 20  .  Weight: 198 lb 3.2 oz (89.903 kg). There is dry desquamation the skin along the left axilla and inframammary region. There is moderate erythema the skin, particularly the inframammary region. There is no obvious moist desquamation.  Impression: Tolerating radiation therapy well. Radiation therapy is completed.  Plan: Followup visit in one month.

## 2013-03-16 NOTE — Progress Notes (Signed)
OFFICE PROGRESS NOTE  CC**  [name], MD [address] [place] [address] Dr. Arloa Koh  Dr. Theda Sers  DIAGNOSIS: 63 year old female with new diagnosis of left breast cancer.   STAGE:  Cancer of lower-inner quadrant of female breast  Primary site: Breast (Left)  Staging method: AJCC 7th Edition  Clinical: Stage 0 (Tis, N0, cM0)  Summary: Stage 0 (Tis, N0, cM0)   PRIOR THERAPY: 1. screening mammogram that showed calcifications in the left breast measuring 2.0 cm at the 6:00 position. Patient underwent a stereotactic biopsy. 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 Tumor was ER positive PR positive. She is scheduled to have an MRI of the breasts on Friday. She has no family history of breast cancer  2. 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 Breast, partial mastectomy, Left  - DUCTAL CARCINOMA IN SITU, SEE COMMENT.  - IN SITU CARCINOMA IS 2 MM FROM THE NEAREST MARGIN (LATERAL)  - SEE TUMOR SYNOPTIC TEMPLATE BELOW  2. Breast, excision, Left, additional inferior margin  - BENIGN BREAST TISSUE, SEE COMMENT.  - NEGATIVE FOR ATYPIA OR MALIGNANCY.  - SURGICAL MARGINS, NEGATIVE FOR ATYPIA OR MALIGNANCY.  3. Breast, excision, New posterior margin  - BENIGN BREAST TISSUE, SEE COMMENT.  - NEGATIVE FOR ATYPIA OR MALIGNANCY.  - SURGICAL MARGINS, NEGATIVE FOR ATYPIA OR MALIGNANCY.  4. Breast, excision, New lateral margin  - BENIGN BREAST TISSUE, SEE COMMENT.  - NEGATIVE FOR ATYPIA OR MALIGNANCY.  - SURGICAL MARGINS, NEGATIVE FOR ATYPIA OR MALIGNANCY.  Receptor Status: ER(100%), PR (100%), Her2-neu ()  3. S/P radiation therapy administered by Dr. Arloa Koh from 01/30/13 - 03/16/13  4. Adjuvant curative intent Tamoxifen 20 mg daily beginning 03/17/13 x 5 years  CURRENT THERAPY: Tamoxifen 20 mg daily begin  03/17/13  INTERVAL HISTORY: Chelsea Moreno 63 y.o. female returns for followup visit after completion of radiation therapy. Overall she is doing well. She has tolerated radiation very nicely. She did develop erythema and desquamation of the skin. She is applying biopsy and on. She has some fatigue. But this is not interfering with her activities of daily living. Today she denies any headaches double vision blurring of vision fevers chills night sweats. No shortness of breath chest pains palpitations. No abdominal pain no diarrhea or constipation. She has no easy bruising or bleeding. She has no myalgias and arthralgias. No peripheral paresthesias or gait disturbances. Remainder of the 10 point review of systems is negative.  MEDICAL HISTORY: Past Medical History  Diagnosis Date  . COLONIC POLYPS, HX OF 09/24/2008  . HYPERLIPIDEMIA 09/06/2006    takes Atorvasatin nightly  . OSTEOPOROSIS 09/24/2008    takes Actonel every 30days  . HYPERTENSION 09/06/2006    takes Lisinopril daily  . History of bronchitis     yrs ago  . History of migraine     last time many yrs ago  . Dizziness     when nauseated gets dizzy  . Bursitis   . Vitamin D deficiency     takes OTC Vit D  . DEPRESSION 09/23/2007    takes Lexapro daily but hasn't taken since Sept 2014  . Muscle spasms of head and/or neck   . Hemorrhoids   . Diarrhea   . Diabetes insipidus     takes Desmopressin bid  . Breast cancer 11/05/12    left  ALLERGIES:  has No Known Allergies.  MEDICATIONS:  Current Outpatient Prescriptions  Medication Sig Dispense Refill  . alendronate (FOSAMAX) 70 MG tablet Take 1 tablet (70 mg total) by mouth every 7 (seven) days. Take with a full glass of water on an empty stomach.  12 tablet  3  . atorvastatin (LIPITOR) 80 MG tablet Take 80 mg by mouth every evening.      . bimatoprost (LUMIGAN) 0.03 % ophthalmic drops Place 1 drop into both eyes at bedtime.       . Calcium Carbonate-Vitamin D (CALCIUM + D  PO) Take 2 tablets by mouth daily. 600 mg /800 units      . cholecalciferol (VITAMIN D) 1000 UNITS tablet Take 2,000 Units by mouth daily.      . Clobetasol Prop Emollient Base (CLOBETASOL PROPIONATE E) 0.05 % emollient cream Apply 1 application topically daily as needed (for area on leg).       Marland Kitchen desmopressin (DDAVP) 0.01 % SOLN Place into the nose 2 (two) times daily. As needed      . hydrocortisone cream 0.5 % Apply 1 application topically as needed for itching.      Marland Kitchen lisinopril (PRINIVIL,ZESTRIL) 20 MG tablet Take 1 tablet (20 mg total) by mouth daily.  90 tablet  3  . potassium chloride SA (K-DUR,KLOR-CON) 20 MEQ tablet Take 1 tablet (20 mEq total) by mouth 2 (two) times daily.  90 tablet  6   No current facility-administered medications for this visit.    SURGICAL HISTORY:  Past Surgical History  Procedure Laterality Date  . Breast biopsy Left 77yrs ago  . Colonoscopy    . Breast lumpectomy with needle localization Left 12/16/2012    Procedure: PARTIAL MASTECTOMY WITH NEEDLE LOCALIZATION;  Surgeon: Stark Klein, MD;  Location: [facility];  Service: General;  Laterality: Left;  1:00 NL at [place]:  A comprehensive review of systems was negative.     PHYSICAL EXAMINATION: Blood pressure 146/83, pulse 78, temperature 98 F (36.7 C), temperature source Oral, resp. rate 18, height $RemoveBe'5\' 1"'cktAogrFc$  (1.549 m), weight 197 lb 11.2 oz (89.676 kg). Body mass index is 37.37 kg/(m^2). ECOG PERFORMANCE STATUS: 0 - Asymptomatic   General appearance: alert, cooperative and appears stated age Lymph nodes: Cervical, supraclavicular, and axillary nodes normal. Resp: clear to auscultation bilaterally Back: symmetric, no curvature. ROM normal. No CVA tenderness. Cardio: regular rate and rhythm GI: soft, non-tender; bowel sounds normal; no masses,  no organomegaly Extremities: extremities normal, atraumatic, no cyanosis or edema Neurologic: Grossly normal Breasts: right breast normal without  mass, skin or nipple changes or axillary nodes, left breast normal without mass, skin reveals some erythema and desquamation bur healing, no nipple changes or enlarged axillary nodes.   LABORATORY DATA: Lab Results  Component Value Date   WBC 3.6* 12/10/2012   HGB 13.9 12/16/2012   HCT 41.0 12/16/2012   MCV 86.1 12/10/2012   PLT 243 12/10/2012      Chemistry      Component Value Date/Time   NA 123* 12/16/2012 1536   NA 132* 11/12/2012 1249   K 4.5 12/16/2012 1536   K 4.2 11/12/2012 1249   CL 88* 12/10/2012 0935   CO2 28 12/10/2012 0935   CO2 27 11/12/2012 1249   BUN 6 12/10/2012 0935   BUN 9.9 11/12/2012 1249   CREATININE 0.50 12/10/2012 0935   CREATININE 0.7 11/12/2012 1249      Component Value Date/Time   CALCIUM  9.3 12/10/2012 0935   CALCIUM 10.0 11/12/2012 1249   ALKPHOS 85 11/12/2012 1249   ALKPHOS 80 09/10/2012 1136   AST 24 11/12/2012 1249   AST 24 09/10/2012 1136   ALT 26 11/12/2012 1249   ALT 26 09/10/2012 1136   BILITOT 1.10 11/12/2012 1249   BILITOT 0.9 09/10/2012 1136       RADIOGRAPHIC STUDIES:  No results found.  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 Patient did not have any invasive disease. The tumor was ER positive. She is a good candidate for adjuvant radiation therapy. She will be finishing up adjuvant radiation therapy today. She's tolerated it well.  #2 adjuvant tamoxifen: Starting 03/17/2013 patient will begin tamoxifen 20 mg daily. We discussed risks benefits side effects and rationale for tamoxifen. She was given literature. She has read this over. She has asked all pertinent questions. A prescription for tamoxifen was sent to her pharmacy [facility] with 12 refills  #3 we discussed patient joining survivorship programs including [facility] strong at the [facility] as well as the [facility] that is going be starting in May 04 2013.  #4 Follow up: patient will be seen back in 3 months with labs and office  visit and monitoring of side effects from tamoxifen     All questions were answered. The patient knows to call the clinic with any problems, questions or concerns. We can certainly see the patient much sooner if necessary.  I spent 15 minutes counseling the patient face to face. The total time spent in the appointment was 30 minutes.    Marcy Panning, MD Medical/Oncology [facility] [phone] (beeper) [phone] (Office)

## 2013-03-16 NOTE — Patient Instructions (Signed)

## 2013-03-16 NOTE — Progress Notes (Signed)
[facility] End of Treatment Note  Name:Caleb AISHA GREENBERGER  Date: 03/16/2013 RKY:[id number] DOB:1950/11/07   Status:outpatient    CC: [name], MD  Dr. Stark Klein  REFERRING PHYSICIAN: Dr. Stark Klein   DIAGNOSIS: Stage 0 (Tis N0 M0) DCIS of the left breast    INDICATION FOR TREATMENT: Curative   TREATMENT DATES: 01/29/2013 through 03/16/2013                          SITE/DOSE:   Left breast, prone position, 4500 cGy in 25 sessions followed by left breast boost of 1000 cGy 5 sessions in the supine position                         BEAMS/ENERGY:  Tangential fields, left breast, 6 MV photons. Three-field technique left breast boost with mixed 6 MV and 15 MV photons.                NARRATIVE:  The patient tolerated treatment well although she developed moderate radiation dermatitis along the axilla and inframammary region by completion of therapy. She had areas of dry desquamation with impending moist desquamation of the left inframammary region. She used Radioplex gel and also Biafine cream during her course of therapy.                          PLAN: Routine followup in one month. Patient instructed to call if questions or worsening complaints in interim.

## 2013-03-16 NOTE — Progress Notes (Signed)
  Radiation Oncology         [phone]) [phone] ________________________________  Name: Chelsea Moreno MRN: [id number]  Date: 03/16/2013  DOB: 1951/01/08  Optical Surface Tracking Plan:  Since intensity modulated radiotherapy (IMRT) and 3D conformal radiation treatment methods are predicated on accurate and precise positioning for treatment, intrafraction motion monitoring is medically necessary to ensure accurate and safe treatment delivery.  The ability to quantify intrafraction motion without excessive ionizing radiation dose can only be performed with optical surface tracking. Accordingly, surface imaging offers the opportunity to obtain 3D measurements of patient position throughout IMRT and 3D treatments without excessive radiation exposure.  I am ordering optical surface tracking for this patient's upcoming course of radiotherapy. ________________________________  Rexene Edison, MD 03/16/2013 12:48 PM    Reference:   Ursula Alert, J, et al. Surface imaging-based analysis of intrafraction motion for breast radiotherapy patients.Journal of [place], n. 6, nov. 2014. ISSN [id number].   Available at: <http://www.jacmp.org/index.php/jacmp/article/view/4957>.

## 2013-03-16 NOTE — Progress Notes (Signed)
Pt completed treatment today to left breast. She denies pain, has soreness of left breast. She is applying Radiaplex, advised she continue for 2-3 weeks then apply lotion w/vitamin E. She states she has one very small moist area under her breast with peeling; advised she apply antibiotic ointment.  She has only noticed fatigue a few times during treatment. Gave pt FU card

## 2013-03-17 ENCOUNTER — Telehealth: Payer: Self-pay | Admitting: Oncology

## 2013-03-17 NOTE — Telephone Encounter (Signed)
, °

## 2013-04-20 ENCOUNTER — Encounter (INDEPENDENT_AMBULATORY_CARE_PROVIDER_SITE_OTHER): Payer: Self-pay | Admitting: General Surgery

## 2013-04-20 ENCOUNTER — Ambulatory Visit (INDEPENDENT_AMBULATORY_CARE_PROVIDER_SITE_OTHER): Payer: TRICARE For Life (TFL) | Admitting: General Surgery

## 2013-04-20 VITALS — BP 120/80 | HR 64 | Temp 98.9°F | Resp 20 | Ht 61.5 in | Wt 198.0 lb

## 2013-04-20 DIAGNOSIS — C50319 Malignant neoplasm of lower-inner quadrant of unspecified female breast: Secondary | ICD-10-CM

## 2013-04-20 NOTE — Assessment & Plan Note (Signed)
No clinical evidence of disease.  Continue tamoxifen.  Followup with me in 6 months.  She has oncology appointment in 3 months.  Mammogram will not be due until approximately November.

## 2013-04-20 NOTE — Patient Instructions (Signed)
Continue tamoxifen  Follow up in 6 months.    Call earlier if concerns.

## 2013-04-20 NOTE — Progress Notes (Signed)
HISTORY: Patient is a 63 year old female approximately 4 months status post left partial mastectomy for DCIS. She completed her radiation therapy around a month ago. She started tamoxifen around a month ago. She did have significant itching and dry skin of her left breast. She also complained of being fatigued during radiation. Her left breast is slightly sore in the partial mastectomy site. She started tamoxifen and has not had any side effects that she can appreciate from taking this medication. She denies any hot flashes. She is not having any joint pain.   PERTINENT REVIEW OF SYSTEMS: O/w negative x 11.  Filed Vitals:   04/20/13 1123  BP: 120/80  Pulse: 64  Temp: 98.9 F (37.2 C)  Resp: 20   Wt Readings from Last 3 Encounters:  04/20/13 198 lb (89.812 kg)  03/16/13 198 lb 3.2 oz (89.903 kg)  03/16/13 197 lb 11.2 oz (89.676 kg)    EXAM: Head: Normocephalic and atraumatic.  Eyes:  Conjunctivae are normal. Pupils are equal, round, and reactive to light. No scleral icterus.  Neck:  Normal range of motion. Neck supple. No tracheal deviation present. No thyromegaly present.  Resp: No respiratory distress, normal effort. Breast:  No palpable masses, no skin dimpling.  Some dry skin on left, some hyperpigmentation.  Improving.  No axillary masses.  No nipple retraction.  Symmetric, ptotic bilaterally Abd:  Abdomen is soft, non distended and non tender. No masses are palpable.  There is no rebound and no guarding.  Neurological: Alert and oriented to person, place, and time. Coordination normal.  Skin: Skin is warm and dry. No rash noted. No diaphoretic. No erythema. No pallor.  Psychiatric: Normal mood and affect. Normal behavior. Judgment and thought content normal.      ASSESSMENT AND PLAN:   Cancer of lower-inner quadrant of Left breast, BCT 12/2012 No clinical evidence of disease.  Continue tamoxifen.  Followup with me in 6 months.  She has oncology appointment in 3  months.  Mammogram will not be due until approximately November.      Milus Height, MD Surgical Oncology, [facility]  [name], MD Marletta Lor, MD

## 2013-04-22 ENCOUNTER — Encounter: Payer: Self-pay | Admitting: *Deleted

## 2013-04-28 ENCOUNTER — Encounter: Payer: Self-pay | Admitting: Radiation Oncology

## 2013-04-28 ENCOUNTER — Ambulatory Visit
Admission: RE | Admit: 2013-04-28 | Discharge: 2013-04-28 | Disposition: A | Discharge status: Home / Self Care | Source: Ambulatory Visit | Attending: Radiation Oncology | Admitting: Radiation Oncology

## 2013-04-28 VITALS — BP 121/63 | HR 80 | Temp 98.2°F | Resp 20 | Wt 200.7 lb

## 2013-04-28 DIAGNOSIS — C50319 Malignant neoplasm of lower-inner quadrant of unspecified female breast: Secondary | ICD-10-CM

## 2013-04-28 NOTE — Progress Notes (Signed)
Pt denies pain, fatigue, loss of appetite. She states she is no longer applying lotion to left breast area and states the skin is "dry and flaky like a sunburn healing". Advised she buy lotion with vitamin E and apply daily. Pt taking Tamoxifen 20 mg daily.

## 2013-04-28 NOTE — Progress Notes (Signed)
Followup note:  Chelsea Moreno returns today approximately 1 month following completion of radiation therapy following conservative surgery in the management of her DCIS of the left breast. She is without complaints today. She is pleased with her cosmesis. She is on adjuvant tamoxifen through Dr. Humphrey Rolls.  Physical examination: Alert and oriented. Filed Vitals:   04/28/13 1535  BP: 121/63  Pulse: 80  Temp: 98.2 F (36.8 C)  Resp: 20   Nodes: Without palpable cervical, supraclavicular, or axillary lymphadenopathy. Chest: Lungs clear. Breasts: There is residual hyperpigmentation of the skin along left breast with slight breast retraction. There is mild to moderate thickening of the left breast as expected. No masses are appreciated. Right breast without masses or lesions. Extremities: Without edema.  Impression: Satisfactory progress. She typically has mammography in October, so I think she can return to [facility] for a diagnostic left breast mammogram and screening right breast mammogram in October. This can be scheduled through medical oncology. She is scheduled see Dr. Laurelyn Sickle oncology team in early July.

## 2013-04-29 ENCOUNTER — Ambulatory Visit: Admitting: Radiation Oncology

## 2013-05-05 ENCOUNTER — Other Ambulatory Visit

## 2013-05-05 ENCOUNTER — Ambulatory Visit (INDEPENDENT_AMBULATORY_CARE_PROVIDER_SITE_OTHER): Admitting: Gastroenterology

## 2013-05-05 ENCOUNTER — Encounter: Payer: Self-pay | Admitting: Gastroenterology

## 2013-05-05 VITALS — BP 120/68 | HR 76 | Ht 61.5 in | Wt 199.1 lb

## 2013-05-05 DIAGNOSIS — E340 Carcinoid syndrome: Secondary | ICD-10-CM

## 2013-05-05 NOTE — Progress Notes (Signed)
Review of pertinent gastrointestinal problems:  1. History of adenomatous polyp: colonoscopy 05/2007 [name] found hemorrhoids and single 72mm polyp that was TA on pathology; repeat colonoscopy 02/2013 was normal; recommended recall at 10 year interval  2. Intermittent vomiting, nausea, diarrhea episodes: EGD Dr. [name] 02/2013 found mild gastritis, biopsies showed no H. pylori   HPI: This is a  Chelsea Moreno whom I last saw about 2 months ago.  She has not had any episodes of n/vomiting, diarrhea and syncope.  Previously had 5-6 episodes from this.      Past Medical History  Diagnosis Date  . COLONIC POLYPS, HX OF 09/24/2008  . HYPERLIPIDEMIA 09/06/2006    takes Atorvasatin nightly  . OSTEOPOROSIS 09/24/2008    takes Actonel every 30days  . HYPERTENSION 09/06/2006    takes Lisinopril daily  . History of bronchitis     yrs ago  . History of migraine     last time many yrs ago  . Dizziness     when nauseated gets dizzy  . Bursitis   . Vitamin D deficiency     takes OTC Vit D  . DEPRESSION 09/23/2007    takes Lexapro daily but hasn't taken since Sept 2014  . Muscle spasms of head and/or neck   . Hemorrhoids   . Diarrhea   . Diabetes insipidus     takes Desmopressin bid  . Breast cancer 11/05/12    left  . Hx of radiation therapy 01/29/13- 03/16/13    left breast 4500 cGy 25 sessions, left breast boost 1000 cGy 5 sessions    Past Surgical History  Procedure Laterality Date  . Breast biopsy Left 27yrs ago  . Colonoscopy    . Breast lumpectomy with needle localization Left 12/16/2012    Procedure: PARTIAL MASTECTOMY WITH NEEDLE LOCALIZATION;  Surgeon: Stark Klein, MD;  Location: [facility];  Service: General;  Laterality: Left;  1:00 NL at [facility]     Current Outpatient Prescriptions  Medication Sig Dispense Refill  . alendronate (FOSAMAX) 70 MG tablet Take 1 tablet (70 mg total) by mouth every 7 (seven) days. Take with a full glass of water on an empty stomach.  12  tablet  3  . atorvastatin (LIPITOR) 80 MG tablet Take 80 mg by mouth every evening.      . bimatoprost (LUMIGAN) 0.03 % ophthalmic drops Place 1 drop into both eyes at bedtime.       . Calcium Carbonate-Vitamin D (CALCIUM + D PO) Take 2 tablets by mouth daily. 600 mg /800 units      . cholecalciferol (VITAMIN D) 1000 UNITS tablet Take 2,000 Units by mouth daily.      . Clobetasol Prop Emollient Base (CLOBETASOL PROPIONATE E) 0.05 % emollient cream Apply 1 application topically daily as needed (for area on leg).       Marland Kitchen desmopressin (DDAVP) 0.01 % SOLN Place into the nose 2 (two) times daily. As needed      . lisinopril (PRINIVIL,ZESTRIL) 20 MG tablet Take 1 tablet (20 mg total) by mouth daily.  90 tablet  3  . potassium chloride SA (K-DUR,KLOR-CON) 20 MEQ tablet Take 1 tablet (20 mEq total) by mouth 2 (two) times daily.  90 tablet  6  . tamoxifen (NOLVADEX) 10 MG tablet Take 10 mg by mouth 2 (two) times daily.       No current facility-administered medications for this visit.    Allergies as of 05/05/2013  . (No Known Allergies)  Family History  Problem Relation Age of Onset  . Ovarian cancer Mother     History   Social History  . Marital Status: Widowed    Spouse Name: N/A    Number of Children: N/A  . Years of Education: N/A   Occupational History  . Not on file.   Social History Main Topics  . Smoking status: Former Smoker    Quit date: 01/07/1973  . Smokeless tobacco: Never Used     Comment: quit 17yrs ago  . Alcohol Use: Yes     Comment: socailly  . Drug Use: No  . Sexual Activity: Not Currently    Birth Control/ Protection: Post-menopausal     Comment: menarche age 78, first live birth age 72, menopause 2002, no HRT   Other Topics Concern  . Not on file   Social History Narrative  . No narrative on file      Physical Exam: BP 120/68  Pulse 76  Ht 5' 1.5" (1.562 m)  Wt 199 lb 2 oz (90.323 kg)  BMI 37.02 kg/m2 Constitutional: generally  well-appearing Psychiatric: alert and oriented x3 Abdomen: soft, nontender, nondistended, no obvious ascites, no peritoneal signs, normal bowel sounds     Assessment and plan: 63 y.o. female with episodic nausea, vomiting, diarrheal episodes  These are of unclear etiology. Colonoscopy and upper endoscopies 2 months ago were essentially normal. She has normal bowels in between these events, the events are once every few months and she has not had anything like it is at least 4 or 5 months. She does understand that with such intermittent events that can be difficult to determine etiology. Carcinoid syndrome can present with intermittent diarrhea and I will have labs drawn to check for this. She will call if she has another one of these GI episodes and I would like to at that time have labs drawn including CBC, complete metabolic profile.

## 2013-05-05 NOTE — Patient Instructions (Signed)
Urinary 25 hour 5HIAA level for carcinoid syndrome. Labs: chromogranin A level.

## 2013-05-08 ENCOUNTER — Other Ambulatory Visit

## 2013-05-08 DIAGNOSIS — E340 Carcinoid syndrome: Secondary | ICD-10-CM

## 2013-05-09 LAB — CHROMOGRANIN A

## 2013-05-13 LAB — 5 HIAA W/CREATININE, 24 HR
5-HIAA W/CREATININE 24 HR UR: 5.8 mg/(24.h) (ref <=6.0)
CREATININE 24 HR UR-5 HIAA W/CREAT: 1.38 g/(24.h) (ref 0.63–2.50)
TOTAL VOLUME 5 HIAA W/ CREATININE: 2400 mL

## 2013-06-10 ENCOUNTER — Other Ambulatory Visit: Payer: Self-pay | Admitting: Internal Medicine

## 2013-06-29 ENCOUNTER — Ambulatory Visit: Admitting: Internal Medicine

## 2013-06-29 ENCOUNTER — Ambulatory Visit (INDEPENDENT_AMBULATORY_CARE_PROVIDER_SITE_OTHER): Admitting: Internal Medicine

## 2013-06-29 ENCOUNTER — Encounter: Payer: Self-pay | Admitting: Internal Medicine

## 2013-06-29 VITALS — BP 140/80 | HR 67 | Temp 98.4°F | Resp 20 | Ht 61.5 in | Wt 205.0 lb

## 2013-06-29 DIAGNOSIS — E232 Diabetes insipidus: Secondary | ICD-10-CM

## 2013-06-29 DIAGNOSIS — I10 Essential (primary) hypertension: Secondary | ICD-10-CM

## 2013-06-29 DIAGNOSIS — E785 Hyperlipidemia, unspecified: Secondary | ICD-10-CM

## 2013-06-29 DIAGNOSIS — M810 Age-related osteoporosis without current pathological fracture: Secondary | ICD-10-CM

## 2013-06-29 MED ORDER — PROMETHAZINE HCL 12.5 MG PO TABS: 12.5000 mg | ORAL_TABLET | Freq: Four times a day (QID) | ORAL | Status: DC | PRN

## 2013-06-29 NOTE — Progress Notes (Signed)
Pre-visit discussion using our clinic review tool. No additional management support is needed unless otherwise documented below in the visit note.  

## 2013-06-29 NOTE — Progress Notes (Signed)
Subjective:    Patient ID: Chelsea Moreno, female    DOB: 11/19/1950, 63 y.o.   MRN: [id number]  HPI  63 year old patient who has treated hypertension, dyslipidemia, and diabetes insipidus.  She has a fairly recent diagnosis of left breast cancer and presently is on tamoxifen.  She also has a history of osteoporosis and is on Fosamax. For the past few weeks.  She has had nocturnal headaches and nausea. In December.  Hyponatremia was noted.  At the present time.  She minimizes the use of desmopressin and spreads out.  The intervals as much as possible  Past Medical History  Diagnosis Date  . COLONIC POLYPS, HX OF 09/24/2008  . HYPERLIPIDEMIA 09/06/2006    takes Atorvasatin nightly  . OSTEOPOROSIS 09/24/2008    takes Actonel every 30days  . HYPERTENSION 09/06/2006    takes Lisinopril daily  . History of bronchitis     yrs ago  . History of migraine     last time many yrs ago  . Dizziness     when nauseated gets dizzy  . Bursitis   . Vitamin D deficiency     takes OTC Vit D  . DEPRESSION 09/23/2007    takes Lexapro daily but hasn't taken since Sept 2014  . Muscle spasms of head and/or neck   . Hemorrhoids   . Diarrhea   . Diabetes insipidus     takes Desmopressin bid  . Breast cancer 11/05/12    left  . Hx of radiation therapy 01/29/13- 03/16/13    left breast 4500 cGy 25 sessions, left breast boost 1000 cGy 5 sessions    History   Social History  . Marital Status: Widowed    Spouse Name: N/A    Number of Children: N/A  . Years of Education: N/A   Occupational History  . Not on file.   Social History Main Topics  . Smoking status: Former Smoker    Quit date: 01/07/1973  . Smokeless tobacco: Never Used     Comment: quit 63yrs ago  . Alcohol Use: Yes     Comment: socailly  . Drug Use: No  . Sexual Activity: Not Currently    Birth Control/ Protection: Post-menopausal     Comment: menarche age 77, first live birth age 57, menopause 2002, no HRT   Other Topics Concern   . Not on file   Social History Narrative  . No narrative on file    Past Surgical History  Procedure Laterality Date  . Breast biopsy Left 63yrs ago  . Colonoscopy    . Breast lumpectomy with needle localization Left 12/16/2012    Procedure: PARTIAL MASTECTOMY WITH NEEDLE LOCALIZATION;  Surgeon: Stark Klein, MD;  Location: [facility];  Service: General;  Laterality: Left;  1:00 NL at [facility]     Family History  Problem Relation Age of Onset  . Ovarian cancer Mother     No Known Allergies  Current Outpatient Prescriptions on File Prior to Visit  Medication Sig Dispense Refill  . atorvastatin (LIPITOR) 80 MG tablet Take one tablet by mouth one time daily  90 tablet  1  . bimatoprost (LUMIGAN) 0.03 % ophthalmic drops Place 1 drop into both eyes at bedtime.       . Calcium Carbonate-Vitamin D (CALCIUM + D PO) Take 2 tablets by mouth daily. 600 mg /800 units      . cholecalciferol (VITAMIN D) 1000 UNITS tablet Take 2,000 Units by mouth daily.      Marland Kitchen  desmopressin (DDAVP) 0.01 % SOLN Place into the nose 2 (two) times daily. As needed      . lisinopril (PRINIVIL,ZESTRIL) 20 MG tablet Take 1 tablet (20 mg total) by mouth daily.  90 tablet  3  . potassium chloride SA (K-DUR,KLOR-CON) 20 MEQ tablet Take 1 tablet (20 mEq total) by mouth 2 (two) times daily.  90 tablet  6   No current facility-administered medications on file prior to visit.    BP 140/80  Pulse 67  Temp(Src) 98.4 F (36.9 C) (Oral)  Resp 20  Ht 5' 1.5" (1.562 m)  Wt 205 lb (92.987 kg)  BMI 38.11 kg/m2  SpO2 99%       Review of Systems  Constitutional: Negative.   HENT: Negative for congestion, dental problem, hearing loss, rhinorrhea, sinus pressure, sore throat and tinnitus.   Eyes: Negative for pain, discharge and visual disturbance.  Respiratory: Negative for cough and shortness of breath.   Cardiovascular: Negative for chest pain, palpitations and leg swelling.  Gastrointestinal: Positive for nausea. Negative  for vomiting, abdominal pain, diarrhea, constipation, blood in stool and abdominal distention.  Genitourinary: Negative for dysuria, urgency, frequency, hematuria, flank pain, vaginal bleeding, vaginal discharge, difficulty urinating, vaginal pain and pelvic pain.  Musculoskeletal: Negative for arthralgias, gait problem and joint swelling.  Skin: Negative for rash.  Neurological: Positive for headaches. Negative for dizziness, syncope, speech difficulty, weakness and numbness.  Hematological: Negative for adenopathy.  Psychiatric/Behavioral: Negative for behavioral problems, dysphoric mood and agitation. The patient is not nervous/anxious.        Objective:   Physical Exam  Constitutional: She is oriented to person, place, and time. She appears well-developed and well-nourished.  HENT:  Head: Normocephalic.  Right Ear: External ear normal.  Left Ear: External ear normal.  Mouth/Throat: Oropharynx is clear and moist.  Eyes: Conjunctivae and EOM are normal. Pupils are equal, round, and reactive to light.  Neck: Normal range of motion. Neck supple. No thyromegaly present.  Cardiovascular: Normal rate, regular rhythm, normal heart sounds and intact distal pulses.   Pulmonary/Chest: Effort normal and breath sounds normal.  Abdominal: Soft. Bowel sounds are normal. She exhibits no mass. There is no tenderness.  Musculoskeletal: Normal range of motion.  Lymphadenopathy:    She has no cervical adenopathy.  Neurological: She is alert and oriented to person, place, and time.  Skin: Skin is warm and dry. No rash noted.  Psychiatric: She has a normal mood and affect. Her behavior is normal.          Assessment & Plan:   Nausea/headaches.  Possible ADE  secondary to tamoxifen Hypertension stable Diabetes insipidus.  History of hyponatremia.  Will check electrolytes History left breast cancer.  Followup oncology next month as scheduled Dyslipidemia.  Continue atorvastatin Osteoporosis.   Will place of Fosamax on hold since using tamoxifen  Recheck 6 months

## 2013-06-29 NOTE — Patient Instructions (Signed)
Hold Fosamax  Take a calcium supplement, plus [phone] units of vitamin D  Followup oncology as planned

## 2013-06-30 ENCOUNTER — Telehealth: Payer: Self-pay | Admitting: Hematology and Oncology

## 2013-06-30 LAB — COMPREHENSIVE METABOLIC PANEL
ALBUMIN: 4.1 g/dL (ref 3.5–5.2)
ALT: 20 U/L (ref 0–35)
AST: 27 U/L (ref 0–37)
Alkaline Phosphatase: 52 U/L (ref 39–117)
BUN: 9 mg/dL (ref 6–23)
CALCIUM: 8.8 mg/dL (ref 8.4–10.5)
CHLORIDE: 93 meq/L — AB (ref 96–112)
CO2: 27 meq/L (ref 19–32)
Creatinine, Ser: 0.4 mg/dL (ref 0.4–1.2)
GFR: 153.7 mL/min (ref >60.00)
Glucose, Bld: 113 mg/dL — ABNORMAL HIGH (ref 70–99)
POTASSIUM: 3.7 meq/L (ref 3.5–5.1)
Sodium: 127 mEq/L — ABNORMAL LOW (ref 135–145)
Total Bilirubin: 0.5 mg/dL (ref 0.2–1.2)
Total Protein: 6.4 g/dL (ref 6.0–8.3)

## 2013-06-30 NOTE — Telephone Encounter (Signed)
, °

## 2013-07-13 ENCOUNTER — Ambulatory Visit: Admitting: Oncology

## 2013-07-13 ENCOUNTER — Other Ambulatory Visit

## 2013-07-14 ENCOUNTER — Other Ambulatory Visit: Payer: Self-pay | Admitting: Oncology

## 2013-07-16 ENCOUNTER — Telehealth: Payer: Self-pay | Admitting: Hematology and Oncology

## 2013-07-16 NOTE — Telephone Encounter (Signed)
added lab to 7/13 appt per 7/7 pof. lmonvm for pt. pt also mychart active.

## 2013-07-20 ENCOUNTER — Encounter: Payer: Self-pay | Admitting: Oncology

## 2013-07-20 ENCOUNTER — Other Ambulatory Visit (HOSPITAL_BASED_OUTPATIENT_CLINIC_OR_DEPARTMENT_OTHER)

## 2013-07-20 ENCOUNTER — Ambulatory Visit (HOSPITAL_BASED_OUTPATIENT_CLINIC_OR_DEPARTMENT_OTHER): Admitting: Oncology

## 2013-07-20 VITALS — BP 145/75 | HR 66 | Temp 97.5°F | Resp 18 | Ht 61.5 in | Wt 201.4 lb

## 2013-07-20 DIAGNOSIS — Z170 Estrogen receptor positive status [ER+]: Secondary | ICD-10-CM

## 2013-07-20 DIAGNOSIS — D0590 Unspecified type of carcinoma in situ of unspecified breast: Secondary | ICD-10-CM

## 2013-07-20 DIAGNOSIS — C50319 Malignant neoplasm of lower-inner quadrant of unspecified female breast: Secondary | ICD-10-CM

## 2013-07-20 DIAGNOSIS — C50312 Malignant neoplasm of lower-inner quadrant of left female breast: Secondary | ICD-10-CM

## 2013-07-20 LAB — COMPREHENSIVE METABOLIC PANEL (CC13)
ALK PHOS: 59 U/L (ref 40–150)
ALT: 19 U/L (ref 0–55)
AST: 20 U/L (ref 5–34)
Albumin: 3.9 g/dL (ref 3.5–5.0)
Anion Gap: 9 mEq/L (ref 3–11)
BUN: 7.6 mg/dL (ref 7.0–26.0)
CO2: 26 mEq/L (ref 22–29)
Calcium: 9.1 mg/dL (ref 8.4–10.4)
Chloride: 106 mEq/L (ref 98–109)
Creatinine: 0.7 mg/dL (ref 0.6–1.1)
Glucose: 98 mg/dl (ref 70–140)
POTASSIUM: 4 meq/L (ref 3.5–5.1)
SODIUM: 142 meq/L (ref 136–145)
TOTAL PROTEIN: 6.8 g/dL (ref 6.4–8.3)
Total Bilirubin: 0.7 mg/dL (ref 0.20–1.20)

## 2013-07-20 LAB — CBC WITH DIFFERENTIAL/PLATELET
BASO%: 2 % (ref 0.0–2.0)
Basophils Absolute: 0.1 10*3/uL (ref 0.0–0.1)
EOS%: 2.8 % (ref 0.0–7.0)
Eosinophils Absolute: 0.1 10*3/uL (ref 0.0–0.5)
HCT: 36.4 % (ref 34.8–46.6)
HGB: 12.3 g/dL (ref 11.6–15.9)
LYMPH#: 0.9 10*3/uL (ref 0.9–3.3)
LYMPH%: 22.5 % (ref 14.0–49.7)
MCH: 31 pg (ref 25.1–34.0)
MCHC: 33.7 g/dL (ref 31.5–36.0)
MCV: 91.9 fL (ref 79.5–101.0)
MONO#: 0.4 10*3/uL (ref 0.1–0.9)
MONO%: 10.1 % (ref 0.0–14.0)
NEUT#: 2.5 10*3/uL (ref 1.5–6.5)
NEUT%: 62.6 % (ref 38.4–76.8)
Platelets: 219 10*3/uL (ref 145–400)
RBC: 3.96 10*6/uL (ref 3.70–5.45)
RDW: 12.8 % (ref 11.2–14.5)
WBC: 4 10*3/uL (ref 3.9–10.3)

## 2013-07-20 NOTE — Patient Instructions (Signed)
Per Dr. Marthann Schiller instructions:  Hold Fosamax  Take a calcium supplement, plus [phone] units of vitamin D  Followup oncology as planned

## 2013-07-20 NOTE — Progress Notes (Signed)
OFFICE PROGRESS NOTE  CC**  Chelsea Cowden, MD [address] [place] [address] Dr. Arloa Koh  Dr. Theda Sers  DIAGNOSIS: 63 year old female with new diagnosis of left breast cancer.   STAGE:  Cancer of lower-inner quadrant of female breast  Primary site: Breast (Left)  Staging method: AJCC 7th Edition  Clinical: Stage 0 (Tis, N0, cM0)  Summary: Stage 0 (Tis, N0, cM0)   PRIOR THERAPY: 1. screening mammogram that showed calcifications in the left breast measuring 2.0 cm at the 6:00 position. Patient underwent a stereotactic biopsy. 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 Tumor was ER positive PR positive. She is scheduled to have an MRI of the breasts on Friday. She has no family history of breast cancer  2. 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 Breast, partial mastectomy, Left  - DUCTAL CARCINOMA IN SITU, SEE COMMENT.  - IN SITU CARCINOMA IS 2 MM FROM THE NEAREST MARGIN (LATERAL)  - SEE TUMOR SYNOPTIC TEMPLATE BELOW  2. Breast, excision, Left, additional inferior margin  - BENIGN BREAST TISSUE, SEE COMMENT.  - NEGATIVE FOR ATYPIA OR MALIGNANCY.  - SURGICAL MARGINS, NEGATIVE FOR ATYPIA OR MALIGNANCY.  3. Breast, excision, New posterior margin  - BENIGN BREAST TISSUE, SEE COMMENT.  - NEGATIVE FOR ATYPIA OR MALIGNANCY.  - SURGICAL MARGINS, NEGATIVE FOR ATYPIA OR MALIGNANCY.  4. Breast, excision, New lateral margin  - BENIGN BREAST TISSUE, SEE COMMENT.  - NEGATIVE FOR ATYPIA OR MALIGNANCY.  - SURGICAL MARGINS, NEGATIVE FOR ATYPIA OR MALIGNANCY.  Receptor Status: ER(100%), PR (100%), Her2-neu ()  3. S/P radiation therapy administered by Dr. Arloa Koh from 01/30/13 - 03/16/13  4. Adjuvant curative intent Tamoxifen 20 mg daily beginning 03/17/13 x 5 years  CURRENT THERAPY: Tamoxifen 20 mg daily begin  03/17/13  INTERVAL HISTORY: Chelsea Moreno 63 y.o. female returns for followup visit to assess tolerance of Tamoxifen. Overall she is doing well.Reports hats she had headaches with nausea for about 1 month when beginning Tamoxifen. Headaches and nausea have improved. Of note, patient states that she had migraine headaches when she was younger. Denies fevers, chills, night sweats. No shortness of breath chest pains palpitations. No abdominal pain no diarrhea or constipation. She has no easy bruising or bleeding. She has no myalgias and arthralgias. No peripheral paresthesias or gait disturbances. Denies hot flashes and vaginal bleeding. No LE swelling, pain, or redness. Remainder of the 10 point review of systems is negative.  MEDICAL HISTORY: Past Medical History  Diagnosis Date  . COLONIC POLYPS, HX OF 09/24/2008  . HYPERLIPIDEMIA 09/06/2006    takes Atorvasatin nightly  . OSTEOPOROSIS 09/24/2008    takes Actonel every 30days  . HYPERTENSION 09/06/2006    takes Lisinopril daily  . History of bronchitis     yrs ago  . History of migraine     last time many yrs ago  . Dizziness     when nauseated gets dizzy  . Bursitis   . Vitamin D deficiency     takes OTC Vit D  . DEPRESSION 09/23/2007    takes Lexapro daily but hasn't taken since Sept 2014  . Muscle spasms of head and/or neck   . Hemorrhoids   . Diarrhea   . Diabetes insipidus     takes Desmopressin bid  . Breast cancer 11/05/12    left  . Hx of radiation  therapy 01/29/13- 03/16/13    left breast 4500 cGy 25 sessions, left breast boost 1000 cGy 5 sessions    ALLERGIES:  has No Known Allergies.  MEDICATIONS:  Current Outpatient Prescriptions  Medication Sig Dispense Refill  . atorvastatin (LIPITOR) 80 MG tablet Take one tablet by mouth one time daily  90 tablet  1  . bimatoprost (LUMIGAN) 0.03 % ophthalmic drops Place 1 drop into both eyes at bedtime.       . Calcium Carbonate-Vitamin D (CALCIUM + D PO) Take 2 tablets by mouth  daily. 600 mg /800 units      . cholecalciferol (VITAMIN D) 1000 UNITS tablet Take 2,000 Units by mouth daily.      Marland Kitchen desmopressin (DDAVP) 0.01 % SOLN Place into the nose 2 (two) times daily. As needed      . lisinopril (PRINIVIL,ZESTRIL) 20 MG tablet Take 1 tablet (20 mg total) by mouth daily.  90 tablet  3  . potassium chloride SA (K-DUR,KLOR-CON) 20 MEQ tablet Take 1 tablet (20 mEq total) by mouth 2 (two) times daily.  90 tablet  6  . promethazine (PHENERGAN) 12.5 MG tablet Take 1 tablet (12.5 mg total) by mouth every 6 (six) hours as needed for nausea or vomiting.  30 tablet  4  . tamoxifen (NOLVADEX) 20 MG tablet Take 20 mg by mouth daily.      . timolol (BETIMOL) 0.5 % ophthalmic solution Place 1 drop into both eyes daily.       No current facility-administered medications for this visit.    SURGICAL HISTORY:  Past Surgical History  Procedure Laterality Date  . Breast biopsy Left 78yrs ago  . Colonoscopy    . Breast lumpectomy with needle localization Left 12/16/2012    Procedure: PARTIAL MASTECTOMY WITH NEEDLE LOCALIZATION;  Surgeon: Stark Klein, MD;  Location: [facility];  Service: General;  Laterality: Left;  1:00 NL at [facility]:  A comprehensive review of systems was negative.     PHYSICAL EXAMINATION: Blood pressure 145/75, pulse 66, temperature 97.5 F (36.4 C), temperature source Oral, resp. rate 18, height 5' 1.5" (1.562 m), weight 201 lb 6.4 oz (91.354 kg). Body mass index is 37.44 kg/(m^2). ECOG PERFORMANCE STATUS: 0 - Asymptomatic   General appearance: alert, cooperative and appears stated age Lymph nodes: Cervical, supraclavicular, and axillary nodes normal. Resp: clear to auscultation bilaterally Back: symmetric, no curvature. ROM normal. No CVA tenderness. Cardio: regular rate and rhythm GI: soft, non-tender; bowel sounds normal; no masses,  no organomegaly Extremities: extremities normal, atraumatic, no cyanosis or edema Neurologic: Grossly  normal Breasts: right breast normal without mass, skin or nipple changes or axillary nodes, left breast normal without mass, skin reveals some erythema and desquamation bur healing, no nipple changes or enlarged axillary nodes.   LABORATORY DATA: Lab Results  Component Value Date   WBC 4.0 07/20/2013   HGB 12.3 07/20/2013   HCT 36.4 07/20/2013   MCV 91.9 07/20/2013   PLT 219 07/20/2013      Chemistry      Component Value Date/Time   NA 142 07/20/2013 1246   NA 127* 06/29/2013 1639   K 4.0 07/20/2013 1246   K 3.7 06/29/2013 1639   CL 93* 06/29/2013 1639   CO2 26 07/20/2013 1246   CO2 27 06/29/2013 1639   BUN 7.6 07/20/2013 1246   BUN 9 06/29/2013 1639   CREATININE 0.7 07/20/2013 1246   CREATININE 0.4 06/29/2013 1639  Component Value Date/Time   CALCIUM 9.1 07/20/2013 1246   CALCIUM 8.8 06/29/2013 1639   ALKPHOS 59 07/20/2013 1246   ALKPHOS 52 06/29/2013 1639   AST 20 07/20/2013 1246   AST 27 06/29/2013 1639   ALT 19 07/20/2013 1246   ALT 20 06/29/2013 1639   BILITOT 0.70 07/20/2013 1246   BILITOT 0.5 06/29/2013 1639       RADIOGRAPHIC STUDIES:  No results found.  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 Patient did not have any invasive disease. The tumor was ER positive. She received XRT completed on 03/16/13.  #2 adjuvant tamoxifen: Tamoxifen 20 mg daily started 03/17/13. Had headaches when beginning this medication, but this has now subsided. Recommend that she continue Tamoxifen. She was instructed to let us know if her headaches worsen.   #3 Follow up: patient will be seen back in 3 months with labs as she is already scheduled with her new physician. She wishes to keep this appointment. Mammogram was requested at [facility] in [place] of this year.     All questions were answered. The patient knows to call the clinic with any problems, questions or concerns. We can certainly see the patient much sooner if  necessary.  I spent 20 minutes counseling the patient face to face. The total time spent in the appointment was 30 minutes.    Mikey Bussing, DNP, AGPCNP-BC

## 2013-07-24 ENCOUNTER — Telehealth: Payer: Self-pay | Admitting: Oncology

## 2013-07-24 NOTE — Telephone Encounter (Signed)
per pof to sch pt mamma-cld [facility] sch for 10/29 @10 :30-left pt message

## 2013-08-18 ENCOUNTER — Other Ambulatory Visit: Payer: Self-pay | Admitting: Internal Medicine

## 2013-09-04 ENCOUNTER — Other Ambulatory Visit: Payer: Self-pay | Admitting: Internal Medicine

## 2013-10-16 ENCOUNTER — Other Ambulatory Visit: Payer: Self-pay

## 2013-10-16 DIAGNOSIS — C50319 Malignant neoplasm of lower-inner quadrant of unspecified female breast: Secondary | ICD-10-CM

## 2013-10-19 ENCOUNTER — Other Ambulatory Visit (HOSPITAL_BASED_OUTPATIENT_CLINIC_OR_DEPARTMENT_OTHER)

## 2013-10-19 ENCOUNTER — Ambulatory Visit (HOSPITAL_BASED_OUTPATIENT_CLINIC_OR_DEPARTMENT_OTHER): Admitting: Hematology and Oncology

## 2013-10-19 ENCOUNTER — Telehealth: Payer: Self-pay | Admitting: Hematology and Oncology

## 2013-10-19 VITALS — BP 152/69 | HR 64 | Temp 98.5°F | Resp 19 | Ht 61.5 in | Wt 202.2 lb

## 2013-10-19 DIAGNOSIS — C50312 Malignant neoplasm of lower-inner quadrant of left female breast: Secondary | ICD-10-CM

## 2013-10-19 DIAGNOSIS — D0592 Unspecified type of carcinoma in situ of left breast: Secondary | ICD-10-CM

## 2013-10-19 DIAGNOSIS — C50319 Malignant neoplasm of lower-inner quadrant of unspecified female breast: Secondary | ICD-10-CM

## 2013-10-19 LAB — CBC WITH DIFFERENTIAL/PLATELET
BASO%: 1.4 % (ref 0.0–2.0)
Basophils Absolute: 0.1 10*3/uL (ref 0.0–0.1)
EOS ABS: 0.1 10*3/uL (ref 0.0–0.5)
EOS%: 1.6 % (ref 0.0–7.0)
HCT: 37.5 % (ref 34.8–46.6)
HGB: 12.7 g/dL (ref 11.6–15.9)
LYMPH%: 24.5 % (ref 14.0–49.7)
MCH: 30.9 pg (ref 25.1–34.0)
MCHC: 34 g/dL (ref 31.5–36.0)
MCV: 90.9 fL (ref 79.5–101.0)
MONO#: 0.5 10*3/uL (ref 0.1–0.9)
MONO%: 11 % (ref 0.0–14.0)
NEUT%: 61.5 % (ref 38.4–76.8)
NEUTROS ABS: 3 10*3/uL (ref 1.5–6.5)
Platelets: 236 10*3/uL (ref 145–400)
RBC: 4.12 10*6/uL (ref 3.70–5.45)
RDW: 12.7 % (ref 11.2–14.5)
WBC: 4.9 10*3/uL (ref 3.9–10.3)
lymph#: 1.2 10*3/uL (ref 0.9–3.3)

## 2013-10-19 LAB — COMPREHENSIVE METABOLIC PANEL (CC13)
ALT: 19 U/L (ref 0–55)
AST: 19 U/L (ref 5–34)
Albumin: 3.8 g/dL (ref 3.5–5.0)
Alkaline Phosphatase: 62 U/L (ref 40–150)
Anion Gap: 9 mEq/L (ref 3–11)
BUN: 5.9 mg/dL — AB (ref 7.0–26.0)
CALCIUM: 9.7 mg/dL (ref 8.4–10.4)
CHLORIDE: 105 meq/L (ref 98–109)
CO2: 28 mEq/L (ref 22–29)
Creatinine: 0.7 mg/dL (ref 0.6–1.1)
GLUCOSE: 94 mg/dL (ref 70–140)
Potassium: 4.1 mEq/L (ref 3.5–5.1)
Sodium: 141 mEq/L (ref 136–145)
Total Bilirubin: 0.47 mg/dL (ref 0.20–1.20)
Total Protein: 7.1 g/dL (ref 6.4–8.3)

## 2013-10-19 NOTE — Progress Notes (Signed)
Patient Care Team: Marletta Lor, MD as PCP - General  DIAGNOSIS: Cancer of lower-inner quadrant of left female breast   Primary site: Breast (Left)   Staging method: AJCC 7th Edition   Clinical: Stage 0 (Tis, N0, cM0)   Summary: Stage 0 (Tis, N0, cM0)   Clinical comments: Staged at breast conference 11.5.14   SUMMARY OF ONCOLOGIC HISTORY:   Cancer of lower-inner quadrant of left female breast   11/05/2012 Initial Diagnosis DCIS with calcifications and necrosis ER 100% PR 100%   12/26/2012 Surgery Left partial mastectomy: DCIS: Reexcision margins benign   01/30/2013 - 03/16/2013 Radiation Therapy Radiation therapy adjuvant   03/17/2013 -  Anti-estrogen oral therapy Adjuvant tamoxifen 20 mg daily plan is for 5 years    CHIEF COMPLIANT: Followup of DCIS  INTERVAL HISTORY: Chelsea Moreno is a 63 year old Caucasian with above-mentioned history of DCIS involving left breast treated with lumpectomy and radiation therapy. She started adjuvant tamoxifen. She is tolerating it extremely well without any major problems or concerns. She gets mammograms once a year. Next mammogram is scheduled to be done in a week from now. She also has a history of diabetes insipidus for which she is on therapy. Diabetes insipidus causes her to be very thirsty all the time along with frequent urination. She is now using desmopressin nasal spray as needed. She was previously severely hyponatremic. But over the past 3 months she has cut down her fluid intake and hence her sodium levels have normalized.  REVIEW OF SYSTEMS:   Constitutional: Denies fevers, chills or abnormal weight loss Eyes: Denies blurriness of vision Ears, nose, mouth, throat, and face: Denies mucositis or sore throat Respiratory: Denies cough, dyspnea or wheezes Cardiovascular: Denies palpitation, chest discomfort or lower extremity swelling Gastrointestinal:  Denies nausea, heartburn or change in bowel habits Skin: Denies abnormal skin  rashes Lymphatics: Denies new lymphadenopathy or easy bruising Neurological:Denies numbness, tingling or new weaknesses Behavioral/Psych: Mood is stable, no new changes  Breast:  denies any pain or lumps or nodules in either breasts All other systems were reviewed with the patient and are negative.  I have reviewed the past medical history, past surgical history, social history and family history with the patient and they are unchanged from previous note.  ALLERGIES:  has No Known Allergies.  MEDICATIONS:  Current Outpatient Prescriptions  Medication Sig Dispense Refill  . atorvastatin (LIPITOR) 80 MG tablet Take one tablet by mouth one time daily  90 tablet  1  . bimatoprost (LUMIGAN) 0.03 % ophthalmic drops Place 1 drop into both eyes at bedtime.       . cholecalciferol (VITAMIN D) 1000 UNITS tablet Take 2,000 Units by mouth daily.      Marland Kitchen desmopressin (DDAVP) 0.01 % SOLN use one SPRAY in each nostril TWICE DAILY   5 mL  2  . lisinopril (PRINIVIL,ZESTRIL) 20 MG tablet TAKE ONE TABLET BY MOUTH ONE TIME DAILY   90 tablet  1  . potassium chloride SA (K-DUR,KLOR-CON) 20 MEQ tablet Take 1 tablet (20 mEq total) by mouth 2 (two) times daily.  90 tablet  6  . promethazine (PHENERGAN) 12.5 MG tablet Take 1 tablet (12.5 mg total) by mouth every 6 (six) hours as needed for nausea or vomiting.  30 tablet  4  . tamoxifen (NOLVADEX) 20 MG tablet Take 20 mg by mouth daily.      . timolol (BETIMOL) 0.5 % ophthalmic solution Place 1 drop into both eyes daily.  No current facility-administered medications for this visit.    PHYSICAL EXAMINATION: ECOG PERFORMANCE STATUS: 1 - Symptomatic but completely ambulatory  Filed Vitals:   10/19/13 1548  BP: 152/69  Pulse: 64  Temp: 98.5 F (36.9 C)  Resp: 19   Filed Weights   10/19/13 1548  Weight: 202 lb 3.2 oz (91.717 kg)    GENERAL:alert, no distress and comfortable SKIN: skin color, texture, turgor are normal, no rashes or significant  lesions EYES: normal, Conjunctiva are pink and non-injected, sclera clear OROPHARYNX:no exudate, no erythema and lips, buccal mucosa, and tongue normal  NECK: supple, thyroid normal size, non-tender, without nodularity LYMPH:  no palpable lymphadenopathy in the cervical, axillary or inguinal LUNGS: clear to auscultation and percussion with normal breathing effort HEART: regular rate & rhythm and no murmurs and no lower extremity edema ABDOMEN:abdomen soft, non-tender and normal bowel sounds Musculoskeletal:no cyanosis of digits and no clubbing  NEURO: alert & oriented x 3 with fluent speech, no focal motor/sensory deficits BREAST: No palpable masses or nodules in either right or left breasts. No palpable axillary supraclavicular or infraclavicular adenopathy no breast tenderness or nipple discharge.   LABORATORY DATA:  I have reviewed the data as listed   Chemistry      Component Value Date/Time   NA 141 10/19/2013 1530   NA 127* 06/29/2013 1639   K 4.1 10/19/2013 1530   K 3.7 06/29/2013 1639   CL 93* 06/29/2013 1639   CO2 28 10/19/2013 1530   CO2 27 06/29/2013 1639   BUN 5.9* 10/19/2013 1530   BUN 9 06/29/2013 1639   CREATININE 0.7 10/19/2013 1530   CREATININE 0.4 06/29/2013 1639      Component Value Date/Time   CALCIUM 9.7 10/19/2013 1530   CALCIUM 8.8 06/29/2013 1639   ALKPHOS 62 10/19/2013 1530   ALKPHOS 52 06/29/2013 1639   AST 19 10/19/2013 1530   AST 27 06/29/2013 1639   ALT 19 10/19/2013 1530   ALT 20 06/29/2013 1639   BILITOT 0.47 10/19/2013 1530   BILITOT 0.5 06/29/2013 1639       Lab Results  Component Value Date   WBC 4.9 10/19/2013   HGB 12.7 10/19/2013   HCT 37.5 10/19/2013   MCV 90.9 10/19/2013   PLT 236 10/19/2013   NEUTROABS 3.0 10/19/2013     RADIOGRAPHIC STUDIES: I have personally reviewed the radiology reports and agreed with their findings. No results found.   ASSESSMENT & PLAN:  Cancer of lower-inner quadrant of left female breast Left breast  DCIS ER/PR +2 cm tumor status post lumpectomy and radiation currently on tamoxifen since March 2015. Patient is now tolerating tamoxifen extremely well without any major problems or concerns.  Surveillance: Patient is scheduled for mammograms next week. Today's breast exam did not reveal any abnormalities.  Survivorship:Discussed the importance of physical exercise in decreasing the likelihood of breast cancer recurrence. Recommended 30 mins daily 6 days a week of either brisk walking or cycling or swimming. Encouraged patient to eat more fruits and vegetables and decrease red meat.    Diabetes insipidus: Patient is being managed with desmopressin nasal spray. The diabetes insipidus is the reason why she had previously been hyponatremic.  Orders Placed This Encounter  Procedures  . CBC with Differential    Standing Status: Future     Number of Occurrences:      Standing Expiration Date: 10/19/2014  . Comprehensive metabolic panel (Cmet) - [facility]    Standing Status: Future     Number  of Occurrences:      Standing Expiration Date: 10/19/2014   The patient has a good understanding of the overall plan. she agrees with it. She will call with any problems that may develop before her next visit here.  I spent 15 minutes counseling the patient face to face. The total time spent in the appointment was 20 minutes and more than 50% was on counseling and review of test results    Rulon Eisenmenger, MD 10/19/2013 4:47 PM

## 2013-10-19 NOTE — Assessment & Plan Note (Signed)
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 Patient is now tolerating tamoxifen extremely well without any major problems or concerns.  Surveillance: Patient is scheduled for mammograms next week. Today's breast exam did not reveal any abnormalities.  Survivorship:Discussed the importance of physical exercise in decreasing the likelihood of breast cancer recurrence. Recommended 30 mins daily 6 days a week of either brisk walking or cycling or swimming. Encouraged patient to eat more fruits and vegetables and decrease red meat.

## 2013-10-19 NOTE — Telephone Encounter (Signed)
per pof to sch pt appt-gave pt copy of sch °

## 2013-11-09 ENCOUNTER — Other Ambulatory Visit: Payer: Self-pay | Admitting: Obstetrics & Gynecology

## 2013-11-09 LAB — HM PAP SMEAR

## 2013-11-10 LAB — CYTOLOGY - PAP

## 2013-11-18 ENCOUNTER — Ambulatory Visit: Admitting: Physical Therapy

## 2013-11-25 ENCOUNTER — Ambulatory Visit: Attending: General Surgery | Admitting: Physical Therapy

## 2013-11-25 DIAGNOSIS — I890 Lymphedema, not elsewhere classified: Secondary | ICD-10-CM | POA: Diagnosis not present

## 2013-11-25 DIAGNOSIS — C50912 Malignant neoplasm of unspecified site of left female breast: Secondary | ICD-10-CM | POA: Diagnosis not present

## 2013-11-25 DIAGNOSIS — Z5189 Encounter for other specified aftercare: Secondary | ICD-10-CM | POA: Diagnosis present

## 2013-11-26 NOTE — Therapy (Signed)
Physical Therapy Evaluation  Patient Details  Name: Chelsea Moreno MRN: [id number] Date of Birth: [birth year]  Encounter Date: 11/25/2013      PT End of Session - 11/26/13 0737    Visit Number 1   Number of Visits 3   Date for PT Re-Evaluation 12/24/13   PT Start Time 3235   PT Stop Time 1600   PT Time Calculation (min) 45 min   Behavior During Therapy [facility] for tasks assessed/performed      Past Medical History  Diagnosis Date  . COLONIC POLYPS, HX OF 09/24/2008  . HYPERLIPIDEMIA 09/06/2006    takes Atorvasatin nightly  . OSTEOPOROSIS 09/24/2008    takes Actonel every 30days  . HYPERTENSION 09/06/2006    takes Lisinopril daily  . History of bronchitis     yrs ago  . History of migraine     last time many yrs ago  . Dizziness     when nauseated gets dizzy  . Bursitis   . Vitamin D deficiency     takes OTC Vit D  . DEPRESSION 09/23/2007    takes Lexapro daily but hasn't taken since Sept 2014  . Muscle spasms of head and/or neck   . Hemorrhoids   . Diarrhea   . Diabetes insipidus     takes Desmopressin bid  . Breast cancer 11/05/12    left  . Hx of radiation therapy 01/29/13- 03/16/13    left breast 4500 cGy 25 sessions, left breast boost 1000 cGy 5 sessions    Past Surgical History  Procedure Laterality Date  . Breast biopsy Left 91yrs ago  . Colonoscopy    . Breast lumpectomy with needle localization Left 12/16/2012    Procedure: PARTIAL MASTECTOMY WITH NEEDLE LOCALIZATION;  Surgeon: Stark Klein, MD;  Location: [place];  Service: General;  Laterality: Left;  1:00 NL at [facility]     There were no vitals taken for this visit.  Visit Diagnosis:  Breast cancer, left - Plan: PT plan of care cert/re-cert  Lymphedema of breast - Plan: PT plan of care cert/re-cert      Subjective Assessment - 11/25/13 1546    Symptoms pain in left breast, arm intermittently associated with some swelling   Currently in Pain? No/denies  she states her breast is uncompfortable in certain  positions, not pain          [facility] PT Assessment - 11/25/13 1528    Assessment   Medical Diagnosis breast cancer   Onset Date 11/07/12   Precautions   Precautions Other (comment)   Precaution Comments cancer history   Restrictions   Weight Bearing Restrictions No   Balance Screen   Has the patient fallen in the past 6 months No  has a tendency to fall.  last one was sept 2014   Has the patient had a decrease in activity level because of a fear of falling?  No   Is the patient reluctant to leave their home because of a fear of falling?  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  Vocation Requirements travelling for work   Leisure admits to sedentary actiivies, reading, friends   Observation/Other Assessments   Observations firm tissue at lower inner quadrant of right breast that she says is her area of swelling   Skin Integrity well healed   Posture/Postural Control   Posture/Postural Control Postural limitations   Postural Limitations Rounded Shoulders;Forward head;Increased thoracic kyphosis   Posture Comments obesity   AROM   Right Shoulder Flexion 158 Degrees   Right Shoulder ABduction 165 Degrees   Left Shoulder Flexion 148 Degrees   Left Shoulder ABduction 166 Degrees   Strength   Overall Strength Within functional limits for tasks performed   Overall Strength Comments pt admits to being sedentary and does not do any strength training                  Plan - 11/26/13 0740    Clinical Impression Statement Possibly edema and  swelling in left breast that may be improved with manual lymph drainage and instruction in self treatment and lymphedema risk reduction practices and possibly swell spot compression. She may benefit from exercise for posture improvement and strengthening if she is interested  She was issued the klosetraining DVD to borrow for self instruction to get started at home   Pt will benefit from skilled therapeutic intervention in order to improve on the following deficits Increased edema   Rehab Potential Excellent   PT Frequency 1x / week   PT Duration 2 weeks   PT Treatment/Interventions Patient/family education;Manual lymph drainage   PT Next Visit Plan perform and instruct in left breast manual lymph drainage.  Discuss swell spots and possibly issue foam patch for firm area on breast        Problem List Patient Active Problem List   Diagnosis Date Noted  . Cancer of lower-inner quadrant of left female breast 11/07/2012  . Lichen planus 47/42/5956  . Diabetes insipidus 03/28/2011  . OSTEOPOROSIS 09/24/2008  . DEPRESSION 09/23/2007  . HYPERLIPIDEMIA 09/06/2006  . HYPERTENSION 09/06/2006            LYMPHEDEMA/ONCOLOGY QUESTIONNAIRE - 11/25/13 1556    Right Upper Extremity Lymphedema   10 cm Proximal to Olecranon Process 36.7 cm   Olecranon Process 27.9 cm   10 cm Proximal to Ulnar Styloid Process 28 cm   Just Proximal to Ulnar Styloid Process 18 cm   Across Hand at [employer] 20.5 cm   At [place] of 2nd Digit 6.7 cm   Left Upper Extremity Lymphedema   10 cm Proximal to Olecranon Process 36.3 cm   Olecranon Process 28.3 cm   10 cm Proximal to Ulnar Styloid Process 27.2 cm   Just Proximal to Ulnar Styloid Process 17.9 cm   Across Hand at [employer] 20 cm   At [place] of 2nd Digit 6.4 cm                          [place] - 11/26/13 0001    Open a tight or new jar Mild difficulty   Do heavy household chores (wash walls, wash floors) No difficulty    Carry a shopping bag or briefcase No difficulty   Wash your back No difficulty   Use a knife to cut food No difficulty   Recreational activities in which you take some force or impact through your arm, shoulder, or hand (golf, hammering, tennis) No difficulty   During the past week, to what  extent has your arm, shoulder or hand problem interfered with your normal social activities with family, friends, neighbors, or groups? Not at all   During the past week, to what extent has your arm, shoulder or hand problem limited your work or other regular daily activities Not at all   Arm, shoulder, or hand pain. None   Tingling (pins and needles) in your arm, shoulder, or hand None   Difficulty Sleeping Mild difficulty   DASH Score 4.55 %                    Short Term Clinic Goals - 11/26/13 0748    CC Short Term Goal  #1   Title short term goals=long term goals          [facility] [facility] Goals - 11/26/13 0749    CC Long Term Goal  #1   Title patient will verbalize lymphedema risk reduction practices use of self manual lymph draiinage, compressoin and exercise for self management   Time 3   Period Weeks   CC Long Term Goal  #2   Title pt will be able to perform self manual lymph drainage techniques   Time 3   Period Weeks   CC Long Term Goal  #3   Title patient will be able to perform home exercise program   Time 3   Period Weeks          Norwood Levo PT 11/26/2013, 7:55 AM

## 2013-11-29 ENCOUNTER — Other Ambulatory Visit: Payer: Self-pay | Admitting: Internal Medicine

## 2013-12-07 ENCOUNTER — Encounter: Payer: Self-pay | Admitting: Physical Therapy

## 2013-12-07 ENCOUNTER — Ambulatory Visit: Admitting: Physical Therapy

## 2013-12-07 DIAGNOSIS — I890 Lymphedema, not elsewhere classified: Secondary | ICD-10-CM

## 2013-12-07 DIAGNOSIS — Z5189 Encounter for other specified aftercare: Secondary | ICD-10-CM | POA: Diagnosis not present

## 2013-12-07 NOTE — Patient Instructions (Addendum)
Self manual lymph drainage: Perform this sequence once a day.  Only give enough pressure no your skin to make the skin move.  Diaphragmatic - Supine   Inhale through nose making navel move out toward hands. Exhale through puckered lips, hands follow navel in. Repeat _5__ times. Rest _10__ seconds between repeats.   Copyright  VHI. All rights reserved.  Hug yourself.  Do circles at your neck just above your collarbones.  Repeat this 10 times.  Axilla - One at a Time   Using full weight of flat hand and fingers at center of uninvolved armpit, make _10__ in-place circles.   Copyright  VHI. All rights reserved.  LEG: Inguinal Nodes Stimulation   With small finger side of hand against hip crease on involved side, gently perform circles at the crease. Repeat __10_ times.   Copyright  VHI. All rights reserved.  1) Axilla to Inguinal Nodes - Sweep   On involved side, sweep _4__ times from armpit along side of trunk to hip crease.  Draw an imaginary diagonal line from upper outer breast through the nipple area toward lower inner breast.  Direct fluid upward and inward from this line toward the pathway across your upper chest .  Do this in three rows to treat all of the upper inner breast tissue, and do each row 3-4x.  Direct fluid to treat all of lower outer breast tissue downward and outward toward pathway that is aimed at the left groin.  Repeat the steps above where you do circles in your left groin and right armpit. Copyright  VHI. All rights reserved.

## 2013-12-07 NOTE — Therapy (Signed)
Physical Therapy Treatment  Patient Details  Name: LAVORIS CANIZALES MRN: [id number] Date of Birth: [birth year]  Encounter Date: 12/07/2013      PT End of Session - 12/07/13 1639    Visit Number 2   Number of Visits 3   Date for PT Re-Evaluation 12/24/13   PT Start Time 4580   PT Stop Time 1430   PT Time Calculation (min) 45 min   Activity Tolerance Patient tolerated treatment well   Behavior During Therapy [facility] for tasks assessed/performed      Past Medical History  Diagnosis Date  . COLONIC POLYPS, HX OF 09/24/2008  . HYPERLIPIDEMIA 09/06/2006    takes Atorvasatin nightly  . OSTEOPOROSIS 09/24/2008    takes Actonel every 30days  . HYPERTENSION 09/06/2006    takes Lisinopril daily  . History of bronchitis     yrs ago  . History of migraine     last time many yrs ago  . Dizziness     when nauseated gets dizzy  . Bursitis   . Vitamin D deficiency     takes OTC Vit D  . DEPRESSION 09/23/2007    takes Lexapro daily but hasn't taken since Sept 2014  . Muscle spasms of head and/or neck   . Hemorrhoids   . Diarrhea   . Diabetes insipidus     takes Desmopressin bid  . Breast cancer 11/05/12    left  . Hx of radiation therapy 01/29/13- 03/16/13    left breast 4500 cGy 25 sessions, left breast boost 1000 cGy 5 sessions    Past Surgical History  Procedure Laterality Date  . Breast biopsy Left 76yrs ago  . Colonoscopy    . Breast lumpectomy with needle localization Left 12/16/2012    Procedure: PARTIAL MASTECTOMY WITH NEEDLE LOCALIZATION;  Surgeon: Stark Klein, MD;  Location: [place];  Service: General;  Laterality: Left;  1:00 NL at [facility]     There were no vitals taken for this visit.  Visit Diagnosis:  Lymphedema of breast      Subjective Assessment - 12/07/13 1349    Symptoms Left breast tenderness noted while going up and down stairs or reaching for something.   Currently in Pain? No/denies            [facility] Adult PT Treatment/Exercise - 12/07/13 0001    Manual  Therapy   Manual Therapy Manual Lymphatic Drainage (MLD)   Manual Lymphatic Drainage (MLD) In supine: diaghramatic breathing, short neck, right axilla, left inguinal nodes; anterior inter-axillary pathway, left axillo-inguinal pathway; left breast focused on lateral and inferior aspect redirecting along pathways.          PT Education - 12/07/13 1353    Education provided Yes   Education Details Self manual lymph drainage left breast   Person(s) Educated Patient   Methods Explanation;Tactile cues;Verbal cues;Handout   Comprehension Verbalized understanding;Returned demonstration              Plan - 12/07/13 1639    Clinical Impression Statement Patient may benefit from breast manual lymph drainage to reduce tenderness but she does not appear to have noticeable swelling.  There are no fibrotic areas noted today in any areas of her left breast except possibly a mildly dense area at the site of her incision which is likely scar tissue.  She did report tenderness in her left lateral and inferior breast but that may reduce with treatment.   Pt will benefit from skilled therapeutic intervention in order to improve  on the following deficits Pain;Increased edema   Rehab Potential Good   PT Frequency 1x / week   PT Duration 2 weeks   PT Treatment/Interventions Patient/family education;Manual lymph drainage   PT Next Visit Plan Review self manual lymph drainage as she was given handouts today.  Continue manual lymph drainage if patient feels it is helpful.   Consulted and Agree with Plan of Care Patient        Problem List Patient Active Problem List   Diagnosis Date Noted  . Cancer of lower-inner quadrant of left female breast 11/07/2012  . Lichen planus 81/82/9937  . Diabetes insipidus 03/28/2011  . OSTEOPOROSIS 09/24/2008  . DEPRESSION 09/23/2007  . HYPERLIPIDEMIA 09/06/2006  . HYPERTENSION 09/06/2006      Jorie Zee,MARTI COOPER, PT 12/07/2013, 4:43 PM

## 2013-12-09 ENCOUNTER — Ambulatory Visit: Attending: General Surgery | Admitting: Physical Therapy

## 2013-12-09 DIAGNOSIS — I890 Lymphedema, not elsewhere classified: Secondary | ICD-10-CM | POA: Insufficient documentation

## 2013-12-09 DIAGNOSIS — C50912 Malignant neoplasm of unspecified site of left female breast: Secondary | ICD-10-CM | POA: Diagnosis not present

## 2013-12-09 DIAGNOSIS — Z5189 Encounter for other specified aftercare: Secondary | ICD-10-CM | POA: Insufficient documentation

## 2013-12-09 NOTE — Therapy (Signed)
[place], [place], [address] Phone: [phone]   Fax:  [phone]  Physical Therapy Treatment  Patient Details  Name: ONNIKA SIEBEL MRN: [id number] Date of Birth: [birth year]  Encounter Date: 12/09/2013      PT End of Session - 12/09/13 1415    Visit Number 3   Number of Visits 3  Add one more visit for exercise instruction   Date for PT Re-Evaluation 12/24/13      Past Medical History  Diagnosis Date  . COLONIC POLYPS, HX OF 09/24/2008  . HYPERLIPIDEMIA 09/06/2006    takes Atorvasatin nightly  . OSTEOPOROSIS 09/24/2008    takes Actonel every 30days  . HYPERTENSION 09/06/2006    takes Lisinopril daily  . History of bronchitis     yrs ago  . History of migraine     last time many yrs ago  . Dizziness     when nauseated gets dizzy  . Bursitis   . Vitamin D deficiency     takes OTC Vit D  . DEPRESSION 09/23/2007    takes Lexapro daily but hasn't taken since Sept 2014  . Muscle spasms of head and/or neck   . Hemorrhoids   . Diarrhea   . Diabetes insipidus     takes Desmopressin bid  . Breast cancer 11/05/12    left  . Hx of radiation therapy 01/29/13- 03/16/13    left breast 4500 cGy 25 sessions, left breast boost 1000 cGy 5 sessions    Past Surgical History  Procedure Laterality Date  . Breast biopsy Left 27yrs ago  . Colonoscopy    . Breast lumpectomy with needle localization Left 12/16/2012    Procedure: PARTIAL MASTECTOMY WITH NEEDLE LOCALIZATION;  Surgeon: Stark Klein, MD;  Location: [place];  Service: General;  Laterality: Left;  1:00 NL at [facility]     There were no vitals taken for this visit.  Visit Diagnosis:  Lymphedema of breast  Breast cancer, left          [facility] Adult PT Treatment/Exercise - 12/09/13 1411    Exercises   Exercises Neck   Neck Exercises: Stretches   Lower Cervical/Upper Thoracic Stretch 1 rep   Other Neck Stretches sitting modified cobra   Neck Exercises:  Seated   Cervical Rotation Left   Lateral Flexion Both   Shoulder Shrugs 5 reps   Other Seated Exercise neural stretch        Manual lymph drainage in supine as follows: short neck, right axillary nodes, left inguinal nodes, superficial and deep abdominals; anterior inter-axillary anastamoses, left axillo-inguinal anastamoses. Emphasis on left breast, chest and abdomen         Plan - 12/09/13 1424    Clinical Impression Statement pt returned klose training DVD and reports the information and practive for manual lymph drainage has been helpful.  Pt issued a small patch of 1/4 foam to place in bra for gentle compression .  Also discussed going to [employer] [place] for compression bra and possible swell spots.  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 Cancer of lower-inner quadrant of left female breast 11/07/2012  . Lichen planus 87/68/1157  . Diabetes insipidus 03/28/2011  . OSTEOPOROSIS 09/24/2008  . DEPRESSION 09/23/2007  . HYPERLIPIDEMIA 09/06/2006  . HYPERTENSION 09/06/2006   Donato Heinz. Owens Shark, PT  12/09/2013, 2:26 PM

## 2013-12-17 ENCOUNTER — Ambulatory Visit: Admitting: Physical Therapy

## 2013-12-17 DIAGNOSIS — Z5189 Encounter for other specified aftercare: Secondary | ICD-10-CM | POA: Diagnosis not present

## 2013-12-17 DIAGNOSIS — I890 Lymphedema, not elsewhere classified: Secondary | ICD-10-CM

## 2013-12-17 DIAGNOSIS — C50912 Malignant neoplasm of unspecified site of left female breast: Secondary | ICD-10-CM

## 2013-12-17 NOTE — Therapy (Addendum)
[place], [place], [address] Phone: [phone]   Fax:  [phone]  Physical Therapy Treatment  Patient Details  Name: Chelsea Moreno MRN: [id number] Date of Birth: [birth year]  Encounter Date: 12/17/2013      PT End of Session - 12/17/13 1831    Visit Number 4   Number of Visits 3   Date for PT Re-Evaluation 12/24/13   PT Start Time 0930   PT Stop Time 1015   PT Time Calculation (min) 45 min      Past Medical History  Diagnosis Date  . COLONIC POLYPS, HX OF 09/24/2008  . HYPERLIPIDEMIA 09/06/2006    takes Atorvasatin nightly  . OSTEOPOROSIS 09/24/2008    takes Actonel every 30days  . HYPERTENSION 09/06/2006    takes Lisinopril daily  . History of bronchitis     yrs ago  . History of migraine     last time many yrs ago  . Dizziness     when nauseated gets dizzy  . Bursitis   . Vitamin D deficiency     takes OTC Vit D  . DEPRESSION 09/23/2007    takes Lexapro daily but hasn't taken since Sept 2014  . Muscle spasms of head and/or neck   . Hemorrhoids   . Diarrhea   . Diabetes insipidus     takes Desmopressin bid  . Breast cancer 11/05/12    left  . Hx of radiation therapy 01/29/13- 03/16/13    left breast 4500 cGy 25 sessions, left breast boost 1000 cGy 5 sessions    Past Surgical History  Procedure Laterality Date  . Breast biopsy Left 22yrs ago  . Colonoscopy    . Breast lumpectomy with needle localization Left 12/16/2012    Procedure: PARTIAL MASTECTOMY WITH NEEDLE LOCALIZATION;  Surgeon: Stark Klein, MD;  Location: [place];  Service: General;  Laterality: Left;  1:00 NL at [facility]     There were no vitals taken for this visit.  Visit Diagnosis:  Lymphedema of breast  Breast cancer, left      Subjective Assessment - 12/17/13 0943    Symptoms feels that symptoms are improved she is ready to learn about the strength training program   Patient Stated Goals to get started with exercise after  the first of the year.            [place] Adult PT Treatment/Exercise - 12/17/13 1820    Lumbar Exercises: Supine   Bridge 5 reps   Other Supine Lumbar Exercises lower trunk rotation   Other Supine Lumbar Exercises quadruped alternating arm and leg raise   Lumbar Exercises: Sidelying   Clam 5 reps   Knee/Hip Exercises: Stretches   Active Hamstring Stretch 1 rep   Quad Stretch 1 rep   Hip Flexor Stretch 1 rep   Piriformis Stretch 1 rep   Gastroc Stretch 1 rep   Shoulder Exercises: Stretch   Cross Chest Stretch 1 rep   Wall Stretch - Flexion 1 rep   Shoulder Exercises: ROM/Strengthening   "W" Arms 5 reps   Other ROM/Strengthening Exercises supine chest press, 2# 2 sets of 10          PT Education - 12/17/13 1831    Education provided Yes   Education Details Strength ABC program   Person(s) Educated Patient   Methods Explanation;Demonstration;Verbal cues;Handout   Comprehension Verbalized understanding;Returned demonstration              Plan - 12/17/13 1832  Clinical Impression Statement pt understands concepts of Stength ABC progrm and was able to perform stretches and core exercises. reviewed the rest of exercises. Her daughter in law is a Risk manager and would be able to help her with progression if needed. Pt verbalized that she would like to do the program at home on her own.     PT Next Visit Plan If pt needs futher instruction, she will call back after Jan 1 for another appointment.  If we don't hear back from her by jan 15 we will close this episode. Pt agreed to plan   Consulted and Agree with Plan of Care Patient            [place] [facility] Goals - 12/17/13 1835    CC Short Term Goal  #1   Title short term goals=long term goals   Status Achieved         Long Term Clinic Goals - 12/17/13 1836    CC Long Term Goal  #1   Title patient will verbalize lymphedema risk reduction practices use of self manual lymph draiinage, compressoin and  exercise for self management   Time 3   Period Weeks   Status Achieved   CC Long Term Goal  #2   Title pt will be able to perform self manual lymph drainage techniques   Time 3   Period Weeks   Status Achieved   CC Long Term Goal  #3   Title patient will be able to perform home exercise program   Time 3   Period Weeks   Status Achieved         Problem List Patient Active Problem List   Diagnosis Date Noted  . Cancer of lower-inner quadrant of left female breast 11/07/2012  . Lichen planus 67/61/9509  . Diabetes insipidus 03/28/2011  . OSTEOPOROSIS 09/24/2008  . DEPRESSION 09/23/2007  . HYPERLIPIDEMIA 09/06/2006  . HYPERTENSION 09/06/2006   Donato Heinz. Owens Shark, PT   12/17/2013, 6:37 PM     PHYSICAL THERAPY DISCHARGE SUMMARY  Visits from Start of Care: 4  Current functional level related to goals / functional outcomes: Feels she can continue on her own   Remaining deficits: Needs general fitness    Education / Equipment: Lymphedema risk reduction, self manual lymph drainage, home exercise  Plan: Patient agrees to discharge.  Patient goals were met. Patient is being discharged due to meeting the stated rehab goals.  ?????        Maudry Diego, PT 01/19/2015 8:53 AM

## 2013-12-17 NOTE — Patient Instructions (Signed)
Instructed in concepts and components of progressive strengthening in Strength ABC program

## 2014-01-24 ENCOUNTER — Other Ambulatory Visit: Payer: Self-pay | Admitting: Internal Medicine

## 2014-03-10 ENCOUNTER — Other Ambulatory Visit: Payer: Self-pay | Admitting: Internal Medicine

## 2014-04-01 ENCOUNTER — Other Ambulatory Visit: Payer: Self-pay | Admitting: Oncology

## 2014-04-01 ENCOUNTER — Other Ambulatory Visit: Payer: Self-pay

## 2014-04-01 DIAGNOSIS — C50312 Malignant neoplasm of lower-inner quadrant of left female breast: Secondary | ICD-10-CM

## 2014-04-01 MED ORDER — TAMOXIFEN CITRATE 20 MG PO TABS: 20.0000 mg | ORAL_TABLET | Freq: Every day | ORAL | Status: DC

## 2014-04-01 NOTE — Telephone Encounter (Signed)
Provider no longer at this practice  

## 2014-04-26 ENCOUNTER — Ambulatory Visit: Admitting: Hematology and Oncology

## 2014-04-26 ENCOUNTER — Other Ambulatory Visit: Payer: Self-pay

## 2014-04-26 ENCOUNTER — Other Ambulatory Visit

## 2014-04-27 ENCOUNTER — Telehealth: Payer: Self-pay | Admitting: Hematology and Oncology

## 2014-04-27 NOTE — Telephone Encounter (Signed)
Called and left a message with a new appointment °

## 2014-05-06 ENCOUNTER — Other Ambulatory Visit: Payer: Self-pay | Admitting: Internal Medicine

## 2014-05-20 ENCOUNTER — Ambulatory Visit (HOSPITAL_BASED_OUTPATIENT_CLINIC_OR_DEPARTMENT_OTHER): Admitting: Hematology and Oncology

## 2014-05-20 ENCOUNTER — Other Ambulatory Visit (HOSPITAL_BASED_OUTPATIENT_CLINIC_OR_DEPARTMENT_OTHER)

## 2014-05-20 ENCOUNTER — Telehealth: Payer: Self-pay | Admitting: Hematology and Oncology

## 2014-05-20 VITALS — BP 148/73 | HR 78 | Temp 98.0°F | Resp 18 | Ht 61.5 in | Wt 203.9 lb

## 2014-05-20 DIAGNOSIS — Z170 Estrogen receptor positive status [ER+]: Secondary | ICD-10-CM

## 2014-05-20 DIAGNOSIS — D0592 Unspecified type of carcinoma in situ of left breast: Secondary | ICD-10-CM | POA: Diagnosis not present

## 2014-05-20 DIAGNOSIS — C50312 Malignant neoplasm of lower-inner quadrant of left female breast: Secondary | ICD-10-CM

## 2014-05-20 LAB — CBC WITH DIFFERENTIAL/PLATELET
BASO%: 0.7 % (ref 0.0–2.0)
BASOS ABS: 0 10*3/uL (ref 0.0–0.1)
EOS ABS: 0.1 10*3/uL (ref 0.0–0.5)
EOS%: 1.1 % (ref 0.0–7.0)
HEMATOCRIT: 38.8 % (ref 34.8–46.6)
HGB: 13.8 g/dL (ref 11.6–15.9)
LYMPH#: 1.2 10*3/uL (ref 0.9–3.3)
LYMPH%: 22.3 % (ref 14.0–49.7)
MCH: 31.4 pg (ref 25.1–34.0)
MCHC: 35.6 g/dL (ref 31.5–36.0)
MCV: 88.2 fL (ref 79.5–101.0)
MONO#: 0.6 10*3/uL (ref 0.1–0.9)
MONO%: 10 % (ref 0.0–14.0)
NEUT%: 65.9 % (ref 38.4–76.8)
NEUTROS ABS: 3.6 10*3/uL (ref 1.5–6.5)
Platelets: 236 10*3/uL (ref 145–400)
RBC: 4.4 10*6/uL (ref 3.70–5.45)
RDW: 12.7 % (ref 11.2–14.5)
WBC: 5.5 10*3/uL (ref 3.9–10.3)

## 2014-05-20 LAB — COMPREHENSIVE METABOLIC PANEL (CC13)
ALT: 34 U/L (ref 0–55)
ANION GAP: 13 meq/L — AB (ref 3–11)
AST: 37 U/L — ABNORMAL HIGH (ref 5–34)
Albumin: 4.1 g/dL (ref 3.5–5.0)
Alkaline Phosphatase: 66 U/L (ref 40–150)
BILIRUBIN TOTAL: 0.74 mg/dL (ref 0.20–1.20)
BUN: 9 mg/dL (ref 7.0–26.0)
CO2: 24 mEq/L (ref 22–29)
CREATININE: 0.7 mg/dL (ref 0.6–1.1)
Calcium: 9.1 mg/dL (ref 8.4–10.4)
Chloride: 104 mEq/L (ref 98–109)
Glucose: 111 mg/dl (ref 70–140)
Potassium: 4.2 mEq/L (ref 3.5–5.1)
Sodium: 140 mEq/L (ref 136–145)
Total Protein: 7.2 g/dL (ref 6.4–8.3)

## 2014-05-20 NOTE — Assessment & Plan Note (Signed)
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   Tamoxifen toxicities: Patient is now tolerating tamoxifen extremely well without any major problems or concerns.  Breast Cancer Surveillance: 1. Breast exam 05/20/2014: Normal 2. Mammogram 10/22/2013 No abnormalities. Postsurgical changes. Breast Density Category A. I recommended that she get 3-D mammograms for surveillance. Discussed the differences between different breast density categories.   Survivorship:Discussed the importance of physical exercise in decreasing the likelihood of breast cancer recurrence. Recommended 30 mins daily 6 days a week of either brisk walking or cycling or swimming. Encouraged patient to eat more fruits and vegetables and decrease red meat.   Return to clinic in 6 months for follow-up

## 2014-05-20 NOTE — Telephone Encounter (Signed)
Appointment made and vs printed for pt  anne

## 2014-05-20 NOTE — Progress Notes (Signed)
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 She is tolerating it very well without any major problems. Denies any hot flashes or muscle aches or pains. Her mammogram in October on the right breast was normal. Denies any new lumps or nodules in the breasts.  REVIEW OF SYSTEMS:   Constitutional: Denies fevers, chills or abnormal weight loss Eyes: Denies blurriness of vision Ears, nose, mouth, throat, and face: Denies mucositis or sore throat Respiratory: Denies cough, dyspnea or wheezes Cardiovascular: Denies palpitation, chest discomfort or lower extremity swelling Gastrointestinal:  Denies nausea, heartburn or change in bowel habits Skin: Denies abnormal skin rashes Lymphatics: Denies new lymphadenopathy or easy bruising Neurological:Denies numbness, tingling or new weaknesses Behavioral/Psych: Mood is stable, no new changes  Breast:  denies any pain or lumps or nodules in either breasts All other systems were reviewed with the patient and  are negative.  I have reviewed the past medical history, past surgical history, social history and family history with the patient and they are unchanged from previous note.  ALLERGIES:  has No Known Allergies.  MEDICATIONS:  Current Outpatient Prescriptions  Medication Sig Dispense Refill  . atorvastatin (LIPITOR) 80 MG tablet TAKE ONE TABLET BY MOUTH ONE TIME DAILY  90 tablet 1  . bimatoprost (LUMIGAN) 0.03 % ophthalmic drops Place 1 drop into both eyes at bedtime.     Marland Kitchen desmopressin (DDAVP NASAL) 0.01 % solution USE ONE SPRAY IN EACH NOSTRIL TWICE DAILY 5 mL 2  . lisinopril (PRINIVIL,ZESTRIL) 20 MG tablet TAKE ONE TABLET BY MOUTH ONE TIME DAILY 90 tablet 0  . potassium chloride SA (K-DUR,KLOR-CON) 20 MEQ tablet Take 1 tablet (20 mEq total) by mouth 2 (two) times daily. 90 tablet 6  . promethazine (PHENERGAN) 12.5 MG tablet Take 1 tablet (12.5 mg total) by mouth every 6 (six) hours as needed for nausea or vomiting. 30 tablet 4  . tamoxifen (NOLVADEX) 20 MG tablet Take 1 tablet (20 mg total) by mouth daily. 90 tablet 1  . timolol (BETIMOL) 0.5 % ophthalmic solution Place 1 drop into both eyes daily.     No current facility-administered medications for this visit.    PHYSICAL EXAMINATION: ECOG PERFORMANCE STATUS: 0 - Asymptomatic  Filed Vitals:   05/20/14 1427  BP: 148/73  Pulse: 78  Temp: 98 F (36.7 C)  Resp: 18   Filed Weights   05/20/14 1427  Weight: 203 lb 14.4 oz (92.488 kg)    GENERAL:alert, no distress and comfortable SKIN: skin color, texture, turgor are normal,  no rashes or significant lesions EYES: normal, Conjunctiva are pink and non-injected, sclera clear OROPHARYNX:no exudate, no erythema and lips, buccal mucosa, and tongue normal  NECK: supple, thyroid normal size, non-tender, without nodularity LYMPH:  no palpable lymphadenopathy in the cervical, axillary or inguinal LUNGS: clear to auscultation and percussion with normal breathing effort HEART: regular  rate & rhythm and no murmurs and no lower extremity edema ABDOMEN:abdomen soft, non-tender and normal bowel sounds Musculoskeletal:no cyanosis of digits and no clubbing  NEURO: alert & oriented x 3 with fluent speech, no focal motor/sensory deficits BREAST: No palpable masses or nodules in either right or left breasts. No palpable axillary supraclavicular or infraclavicular adenopathy no breast tenderness or nipple discharge. (exam performed in the presence of a chaperone)  LABORATORY DATA:  I have reviewed the data as listed   Chemistry      Component Value Date/Time   NA 141 10/19/2013 1530   NA 127* 06/29/2013 1639   K 4.1 10/19/2013 1530   K 3.7 06/29/2013 1639   CL 93* 06/29/2013 1639   CO2 28 10/19/2013 1530   CO2 27 06/29/2013 1639   BUN 5.9* 10/19/2013 1530   BUN 9 06/29/2013 1639   CREATININE 0.7 10/19/2013 1530   CREATININE 0.4 06/29/2013 1639      Component Value Date/Time   CALCIUM 9.7 10/19/2013 1530   CALCIUM 8.8 06/29/2013 1639   ALKPHOS 62 10/19/2013 1530   ALKPHOS 52 06/29/2013 1639   AST 19 10/19/2013 1530   AST 27 06/29/2013 1639   ALT 19 10/19/2013 1530   ALT 20 06/29/2013 1639   BILITOT 0.47 10/19/2013 1530   BILITOT 0.5 06/29/2013 1639       Lab Results  Component Value Date   WBC 5.5 05/20/2014   HGB 13.8 05/20/2014   HCT 38.8 05/20/2014   MCV 88.2 05/20/2014   PLT 236 05/20/2014   NEUTROABS 3.6 05/20/2014     RADIOGRAPHIC STUDIES: I have personally reviewed the radiology reports and agreed with their findings. 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   Tamoxifen toxicities: Patient is now tolerating tamoxifen extremely well without any major problems or concerns.  Breast Cancer Surveillance: 1. Breast exam 05/20/2014: Normal 2. Mammogram 10/22/2013 No  abnormalities. Postsurgical changes. Breast Density Category A. I recommended that she get 3-D mammograms for surveillance. Discussed the differences between different breast density categories.   Survivorship:patient admitted to not doing any exercise and gaining weight. She will plan to go back to [facility] and doing exercise again.   Return to clinic in 6 months for follow-up and after that we can see her once a year  No orders of the defined types were placed in this encounter.   The patient has a good understanding of the overall plan. she agrees with it. she will call with any problems that may develop before the next visit here.   Rulon Eisenmenger, MD

## 2014-06-02 ENCOUNTER — Encounter: Payer: Self-pay | Admitting: Internal Medicine

## 2014-06-02 ENCOUNTER — Ambulatory Visit (INDEPENDENT_AMBULATORY_CARE_PROVIDER_SITE_OTHER): Admitting: Internal Medicine

## 2014-06-02 VITALS — BP 138/80 | HR 70 | Temp 98.2°F | Resp 20 | Ht 61.5 in | Wt 211.0 lb

## 2014-06-02 DIAGNOSIS — M810 Age-related osteoporosis without current pathological fracture: Secondary | ICD-10-CM

## 2014-06-02 DIAGNOSIS — E232 Diabetes insipidus: Secondary | ICD-10-CM

## 2014-06-02 DIAGNOSIS — I10 Essential (primary) hypertension: Secondary | ICD-10-CM

## 2014-06-02 DIAGNOSIS — F329 Major depressive disorder, single episode, unspecified: Secondary | ICD-10-CM | POA: Diagnosis not present

## 2014-06-02 DIAGNOSIS — F32A Depression, unspecified: Secondary | ICD-10-CM

## 2014-06-02 DIAGNOSIS — G4733 Obstructive sleep apnea (adult) (pediatric): Secondary | ICD-10-CM

## 2014-06-02 DIAGNOSIS — I872 Venous insufficiency (chronic) (peripheral): Secondary | ICD-10-CM | POA: Insufficient documentation

## 2014-06-02 MED ORDER — ESCITALOPRAM OXALATE 10 MG PO TABS: 10.0000 mg | ORAL_TABLET | Freq: Every day | ORAL | Status: DC

## 2014-06-02 NOTE — Progress Notes (Signed)
Pre visit review using our clinic review tool, if applicable. No additional management support is needed unless otherwise documented below in the visit note. 

## 2014-06-02 NOTE — Progress Notes (Signed)
Subjective:    Patient ID: Chelsea Moreno, female    DOB: 10/28/1950, 64 y.o.   MRN: [id number]  HPI  64 year old patient who has hypertension.  She is seen today for follow-up Complaints include left leg pain and swelling.  Swallowing is aggravated towards the end of the day.  It then resolves through the night.  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  Recent electrolyte were reviewed from oncology. She also describes poor sleep and daytime sleepiness.  Her husband was treated with CPAP for OSA. She has a history of depression and feels that she would benefit from resuming Lexapro Past Medical History  Diagnosis Date  . COLONIC POLYPS, HX OF 09/24/2008  . HYPERLIPIDEMIA 09/06/2006    takes Atorvasatin nightly  . OSTEOPOROSIS 09/24/2008    takes Actonel every 30days  . HYPERTENSION 09/06/2006    takes Lisinopril daily  . History of bronchitis     yrs ago  . History of migraine     last time many yrs ago  . Dizziness     when nauseated gets dizzy  . Bursitis   . Vitamin D deficiency     takes OTC Vit D  . DEPRESSION 09/23/2007    takes Lexapro daily but hasn't taken since Sept 2014  . Muscle spasms of head and/or neck   . Hemorrhoids   . Diarrhea   . Diabetes insipidus     takes Desmopressin bid  . Breast cancer 11/05/12    left  . Hx of radiation therapy 01/29/13- 03/16/13    left breast 4500 cGy 25 sessions, left breast boost 1000 cGy 5 sessions    History   Social History  . Marital Status: Widowed    Spouse Name: N/A  . Number of Children: N/A  . Years of Education: N/A   Occupational History  . Not on file.   Social History Main Topics  . Smoking status: Former Smoker    Quit date: 01/07/1973  . Smokeless tobacco: Never Used     Comment: quit 35yrs ago  . Alcohol Use: Yes     Comment: socailly  . Drug Use: No  . Sexual Activity: Not Currently    Birth Control/ Protection: Post-menopausal   Comment: menarche age 89, first live birth age 69, menopause 2002, no HRT   Other Topics Concern  . Not on file   Social History Narrative    Past Surgical History  Procedure Laterality Date  . Breast biopsy Left 20yrs ago  . Colonoscopy    . Breast lumpectomy with needle localization Left 12/16/2012    Procedure: PARTIAL MASTECTOMY WITH NEEDLE LOCALIZATION;  Surgeon: Stark Klein, MD;  Location: [place];  Service: General;  Laterality: Left;  1:00 NL at [facility]     Family History  Problem Relation Age of Onset  . Ovarian cancer Mother     No Known Allergies  Current Outpatient Prescriptions on File Prior to Visit  Medication Sig Dispense Refill  . atorvastatin (LIPITOR) 80 MG tablet TAKE ONE TABLET BY MOUTH ONE TIME DAILY  90 tablet 1  . bimatoprost (LUMIGAN) 0.03 % ophthalmic drops Place 1 drop into both eyes at bedtime.     Marland Kitchen desmopressin (DDAVP NASAL) 0.01 % solution USE ONE SPRAY IN EACH NOSTRIL TWICE DAILY 5 mL 2  . lisinopril (PRINIVIL,ZESTRIL) 20 MG tablet TAKE ONE TABLET BY MOUTH ONE TIME DAILY 90 tablet 0  . potassium chloride SA (  K-DUR,KLOR-CON) 20 MEQ tablet Take 1 tablet (20 mEq total) by mouth 2 (two) times daily. 90 tablet 6  . tamoxifen (NOLVADEX) 20 MG tablet Take 1 tablet (20 mg total) by mouth daily. 90 tablet 1  . timolol (BETIMOL) 0.5 % ophthalmic solution Place 1 drop into both eyes daily.    . promethazine (PHENERGAN) 12.5 MG tablet Take 1 tablet (12.5 mg total) by mouth every 6 (six) hours as needed for nausea or vomiting. (Patient not taking: Reported on 06/02/2014) 30 tablet 4   No current facility-administered medications on file prior to visit.    BP 138/80 mmHg  Pulse 70  Temp(Src) 98.2 F (36.8 C) (Oral)  Resp 20  Ht 5' 1.5" (1.562 m)  Wt 211 lb (95.709 kg)  BMI 39.23 kg/m2  SpO2 98%     Review of Systems  Constitutional: Negative.   HENT: Negative for congestion, dental problem, hearing loss, rhinorrhea, sinus pressure, sore throat and  tinnitus.   Eyes: Negative for pain, discharge and visual disturbance.  Respiratory: Negative for cough and shortness of breath.   Cardiovascular: Positive for leg swelling. Negative for chest pain and palpitations.  Gastrointestinal: Negative for nausea, vomiting, abdominal pain, diarrhea, constipation, blood in stool and abdominal distention.  Genitourinary: Negative for dysuria, urgency, frequency, hematuria, flank pain, vaginal bleeding, vaginal discharge, difficulty urinating, vaginal pain and pelvic pain.  Musculoskeletal: Negative for joint swelling, arthralgias and gait problem.  Skin: Negative for rash.  Neurological: Negative for dizziness, syncope, speech difficulty, weakness, numbness and headaches.  Hematological: Negative for adenopathy.  Psychiatric/Behavioral: Positive for dysphoric mood. Negative for behavioral problems and agitation. The patient is not nervous/anxious.        Objective:   Physical Exam  Constitutional: She is oriented to person, place, and time. She appears well-developed and well-nourished.  Overweight.  Blood pressure 138/80   HENT:  Head: Normocephalic.  Right Ear: External ear normal.  Left Ear: External ear normal.  Mouth/Throat: Oropharynx is clear and moist.  Low hanging soft palate with pharyngeal crowding  Eyes: Conjunctivae and EOM are normal. Pupils are equal, round, and reactive to light.  Neck: Normal range of motion. Neck supple. No thyromegaly present.  Cardiovascular: Normal rate, regular rhythm, normal heart sounds and intact distal pulses.   Pulmonary/Chest: Effort normal and breath sounds normal.  Abdominal: Soft. Bowel sounds are normal. She exhibits no mass. There is no tenderness.  Musculoskeletal: Normal range of motion.  Prominent varicosities of both legs, right greater than left Right leg slightly swollen compared to the left  Lymphadenopathy:    She has no cervical adenopathy.  Neurological: She is alert and oriented to  person, place, and time.  Skin: Skin is warm and dry. No rash noted.  Psychiatric: She has a normal mood and affect. Her behavior is normal.          Assessment & Plan:   Hypertension, fair control.  Low-salt diet, weight loss encouraged Chronic venous insufficiency.  Symptomatic.  Will set up for evaluation Recurrent depression.  We'll start Lexapro Diabetes insipidus Dyslipidemia OSA suspect.  Will set up for home sleep study  CPX 3 months Vascular surgical referral

## 2014-06-02 NOTE — Patient Instructions (Signed)
Limit your sodium (Salt) intake  Please check your blood pressure on a regular basis.  If it is consistently greater than 150/90, please make an office appointment.  You need to lose weight.  Consider a lower calorie diet and regular exercise.    It is important that you exercise regularly, at least 20 minutes 3 to 4 times per week.  If you develop chest pain or shortness of breath seek  medical attention.  Vascular consultation as discussed Home sleep study

## 2014-06-30 ENCOUNTER — Encounter: Payer: Self-pay | Admitting: Gastroenterology

## 2014-07-05 ENCOUNTER — Other Ambulatory Visit: Payer: Self-pay | Admitting: Internal Medicine

## 2014-07-15 ENCOUNTER — Encounter: Payer: Self-pay | Admitting: Neurology

## 2014-07-15 ENCOUNTER — Ambulatory Visit (INDEPENDENT_AMBULATORY_CARE_PROVIDER_SITE_OTHER): Admitting: Neurology

## 2014-07-15 DIAGNOSIS — R197 Diarrhea, unspecified: Secondary | ICD-10-CM

## 2014-07-15 DIAGNOSIS — R109 Unspecified abdominal pain: Secondary | ICD-10-CM | POA: Diagnosis not present

## 2014-07-15 DIAGNOSIS — R404 Transient alteration of awareness: Secondary | ICD-10-CM

## 2014-07-15 DIAGNOSIS — R1110 Vomiting, unspecified: Secondary | ICD-10-CM | POA: Insufficient documentation

## 2014-07-15 DIAGNOSIS — E232 Diabetes insipidus: Secondary | ICD-10-CM

## 2014-07-15 DIAGNOSIS — R4020 Unspecified coma: Secondary | ICD-10-CM

## 2014-07-15 NOTE — Progress Notes (Signed)
[facility]  PATIENT: Chelsea Moreno DOB: 1950-02-26  REFERRING DOCTOR OR PCP:  Dr. Bluford Kaufmann.   Also sees Dr. Stark Klein (surgery) SOURCE: patient and records from EMR and MRI images on PACS  _________________________________   HISTORICAL  CHIEF COMPLAINT:  Chief Complaint  Patient presents with  . Loss of Consciousness    Sts. she has had infrequent syncopal episodes onset about 10 yrs. ago.  She is unable to identify a trigger.  Recently, she had 2 episodes within a week.  She sts. she rarely gets dizzy with position changes./fim    HISTORY OF PRESENT ILLNESS:  I had the pleasure seeing you patient, Chelsea Moreno, for a neurologic consultation regarding her episodes of near syncope.   The first episode was 2 weeks ago and was milder than the second episode a week later.  With the more severe episode, she woke up with a headache.   She then had waves of nausea, cold tingling in the upper body into the shoulders.  The entire episode lasts 90 minutes or so but she has multiple waves of the sensory symptoms lasting 30-60 seconds during that time separated every 8-10 minutes with a more constant nausea.  She has to rush to the bathroom due to diarrhea and she has vomiting.   She just lays on the floor of the bathroom.   During the episode, she vomited about 4 times and had diarrhea multiple times.    About a week earlier, she had a milder episode without vomiting lasting 2-3 hours and felt drained the entire next day.     She has had about 20 episodes over the past 10 years.  She passed out with about a quarter of them.   In 2009, she had a similar episode where she passed out and hit her head on the bathtub.    She has had upper and lower endoscopies after that one.    Because of the passing out, she tries to lay on the floor of the bathroom in between the spasms of symptoms.       All of her 20 episodes have occurred at night except one that occurred while  fasting for surgery.   She did not note that the prior day was unusual with he repisodes and the recent episodes followed normal dietary intake.      In between the episodes, she feels completely fine.    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 Shirlee Limerick)  [place] is from [place] and [place].   She has diabetes insipidus and takes desmopressin.   Sodium was low one time (the episode occurring the day of surgery) and she was told she was dehydrated the day she hit her head.    I personally reviewed the MRI of the brain dated 10/07/2012. It shows small vessel ischemic changes with hyperintense foci in the deep white matter more than in the periventricular and subcortical white matter. This is a nonspecific finding but the extent was more than expected for age.  REVIEW OF SYSTEMS: Constitutional: No fevers, chills, sweats, or change in appetite Eyes: No visual changes, double vision, eye pain Ear, nose and throat: No hearing loss, ear pain, nasal congestion, sore throat Cardiovascular: No chest pain, palpitations Respiratory: No shortness of breath at rest or with exertion.   No wheezes GastrointestinaI: No nausea, vomiting, diarrhea, abdominal pain, fecal incontinence Genitourinary: No dysuria, urinary retention  or frequency.  No nocturia. Musculoskeletal: No neck pain, back pain Integumentary: No rash, pruritus, skin lesions Neurological: as above Psychiatric: No depression at this time.  No anxiety Endocrine: No palpitations, diaphoresis, change in appetite, change in weigh or increased thirst Hematologic/Lymphatic: No anemia, purpura, petechiae. Allergic/Immunologic: No itchy/runny eyes, nasal congestion, recent allergic reactions, rashes  ALLERGIES: No Known Allergies  HOME MEDICATIONS:  Current outpatient prescriptions:  .  atorvastatin (LIPITOR) 80 MG tablet, TAKE ONE TABLET BY MOUTH ONE TIME DAILY , Disp: 90 tablet,  Rfl: 1 .  bimatoprost (LUMIGAN) 0.03 % ophthalmic drops, Place 1 drop into both eyes at bedtime. , Disp: , Rfl:  .  desmopressin (DDAVP NASAL) 0.01 % solution, USE ONE SPRAY IN EACH NOSTRIL TWICE DAILY, Disp: 5 mL, Rfl: 2 .  escitalopram (LEXAPRO) 10 MG tablet, Take 1 tablet (10 mg total) by mouth daily., Disp: 90 tablet, Rfl: 4 .  lisinopril (PRINIVIL,ZESTRIL) 20 MG tablet, TAKE ONE TABLET BY MOUTH ONE TIME DAILY, Disp: 90 tablet, Rfl: 0 .  ondansetron (ZOFRAN) 4 MG tablet, Take 4 mg by mouth every 8 (eight) hours as needed for nausea or vomiting., Disp: , Rfl:  .  potassium chloride SA (K-DUR,KLOR-CON) 20 MEQ tablet, Take 1 tablet (20 mEq total) by mouth 2 (two) times daily., Disp: 90 tablet, Rfl: 6 .  tamoxifen (NOLVADEX) 20 MG tablet, Take 1 tablet (20 mg total) by mouth daily., Disp: 90 tablet, Rfl: 1 .  timolol (BETIMOL) 0.5 % ophthalmic solution, Place 1 drop into both eyes daily., Disp: , Rfl:   PAST MEDICAL HISTORY: Past Medical History  Diagnosis Date  . COLONIC POLYPS, HX OF 09/24/2008  . HYPERLIPIDEMIA 09/06/2006    takes Atorvasatin nightly  . OSTEOPOROSIS 09/24/2008    takes Actonel every 30days  . HYPERTENSION 09/06/2006    takes Lisinopril daily  . History of bronchitis     yrs ago  . History of migraine     last time many yrs ago  . Dizziness     when nauseated gets dizzy  . Bursitis   . Vitamin D deficiency     takes OTC Vit D  . DEPRESSION 09/23/2007    takes Lexapro daily but hasn't taken since Sept 2014  . Muscle spasms of head and/or neck   . Hemorrhoids   . Diarrhea   . Diabetes insipidus     takes Desmopressin bid  . Breast cancer 11/05/12    left  . Hx of radiation therapy 01/29/13- 03/16/13    left breast 4500 cGy 25 sessions, left breast boost 1000 cGy 5 sessions    PAST SURGICAL HISTORY: Past Surgical History  Procedure Laterality Date  . Breast biopsy Left 44yrs ago  . Colonoscopy    . Breast lumpectomy with needle localization Left 12/16/2012     Procedure: PARTIAL MASTECTOMY WITH NEEDLE LOCALIZATION;  Surgeon: Stark Klein, MD;  Location: [facility];  Service: General;  Laterality: Left;  1:00 NL at [place]: Family History  Problem Relation Age of Onset  . Ovarian cancer Mother     SOCIAL HISTORY:  History   Social History  . Marital Status: Widowed    Spouse Name: N/A  . Number of Children: N/A  . Years of Education: N/A   Occupational History  . Not on file.   Social History Main Topics  . Smoking status: Former Smoker    Quit date: 01/07/1973  . Smokeless tobacco: Never Used  Comment: quit 25yrs ago  . Alcohol Use: Yes     Comment: socailly  . Drug Use: No  . Sexual Activity: Not Currently    Birth Control/ Protection: Post-menopausal     Comment: menarche age 5, first live birth age 79, menopause 2002, no HRT   Other Topics Concern  . Not on file   Social History Narrative     PHYSICAL EXAM  There were no vitals filed for this visit.  There is no weight on file to calculate BMI.   General: The patient is well-developed and well-nourished and in no acute distress  Eyes:  Funduscopic exam shows normal optic discs and retinal vessels.  Neck: The neck is supple, no carotid bruits are noted.  The neck is nontender.  Cardiovascular: The heart has a regular rate and rhythm with a normal S1 and S2. There were no murmurs, gallops or rubs.   Skin: Extremities are without significant edema.  Musculoskeletal:  Back is nontender  Neurologic Exam  Mental status: The patient is alert and oriented x 3 at the time of the examination. The patient has apparent normal recent and remote memory, with an apparently normal attention span and concentration ability.   Speech is normal.  Cranial nerves: Extraocular movements are full. Pupils are equal, round, and reactive to light and accomodation.  Visual fields are full.  Facial symmetry is present. There is good facial sensation to soft touch  bilaterally.Facial strength is normal.  Trapezius and sternocleidomastoid strength is normal. No dysarthria is noted.  The tongue is midline, and the patient has symmetric elevation of the soft palate. No obvious hearing deficits are noted.  Motor:  Muscle bulk is normal.   Tone is normal. Strength is  5 / 5 in all 4 extremities.   Sensory: Sensory testing is intact to touch and vibration sensation in all 4 extremities.  Coordination: Cerebellar testing reveals good finger-nose-finger  bilaterally.  Gait and station: Station is normal.   Gait is normal. Tandem gait is normal. Romberg is negative.   Reflexes: Deep tendon reflexes are symmetric and normal bilaterally.      DIAGNOSTIC DATA (LABS, IMAGING, TESTING) - I reviewed patient records, labs, notes, testing and imaging myself where available.  Lab Results  Component Value Date   WBC 5.5 05/20/2014   HGB 13.8 05/20/2014   HCT 38.8 05/20/2014   MCV 88.2 05/20/2014   PLT 236 05/20/2014      Component Value Date/Time   NA 140 05/20/2014 1413   NA 127* 06/29/2013 1639   K 4.2 05/20/2014 1413   K 3.7 06/29/2013 1639   CL 93* 06/29/2013 1639   CO2 24 05/20/2014 1413   CO2 27 06/29/2013 1639   GLUCOSE 111 05/20/2014 1413   GLUCOSE 113* 06/29/2013 1639   BUN 9.0 05/20/2014 1413   BUN 9 06/29/2013 1639   CREATININE 0.7 05/20/2014 1413   CREATININE 0.4 06/29/2013 1639   CALCIUM 9.1 05/20/2014 1413   CALCIUM 8.8 06/29/2013 1639   PROT 7.2 05/20/2014 1413   PROT 6.4 06/29/2013 1639   ALBUMIN 4.1 05/20/2014 1413   ALBUMIN 4.1 06/29/2013 1639   AST 37* 05/20/2014 1413   AST 27 06/29/2013 1639   ALT 34 05/20/2014 1413   ALT 20 06/29/2013 1639   ALKPHOS 66 05/20/2014 1413   ALKPHOS 52 06/29/2013 1639   BILITOT 0.74 05/20/2014 1413   BILITOT 0.5 06/29/2013 1639   GFRNONAA >90 12/10/2012 0935   GFRAA >90 12/10/2012 0935  Lab Results  Component Value Date   CHOL 186 07/21/2012   HDL 42.10 07/21/2012   LDLCALC 108*  07/21/2012   LDLDIRECT 171.8 03/21/2011   TRIG 180.0* 07/21/2012   CHOLHDL 4 07/21/2012   No results found for: HGBA1C No results found for: VITAMINB12 Lab Results  Component Value Date   TSH 0.80 07/21/2012       ASSESSMENT AND PLAN  Abdominal pain, unspecified abdominal location  Diarrhea  Loss of consciousness  Diabetes insipidus   In summary, Karthika Glasper is a 64 year old woman with occasional episodes of abdominal pain, diarrhea, dysesthesias, some of which are associated with loss of consciousness.     Sodium was low with at least one of the episodes and she was dehydrated and fasting with the only daytime episode.   Although I am uncertain what is causing these episodes to occur, I feel we need to rule out acute intermittent porphyria as fasting is a known trigger and repeated episodes can lead to permanent impairments..   I will check 24 hour urine for porphyrins, porphobilinogen and aminolevulinic acid.      She will return to see me in 6-10 weeks or sooner if there are new or worsening symptoms.   Gailya Tauer A. Felecia Shelling, MD, PhD 06/11/158, 1:09 PM Certified in Neurology, Clinical Neurophysiology, Sleep Medicine, Pain Medicine and Neuroimaging  [facility] [address] [place], [place] [address] [phone]

## 2014-07-29 ENCOUNTER — Other Ambulatory Visit: Payer: Self-pay | Admitting: Neurology

## 2014-07-29 DIAGNOSIS — E232 Diabetes insipidus: Secondary | ICD-10-CM

## 2014-07-29 DIAGNOSIS — R1013 Epigastric pain: Secondary | ICD-10-CM

## 2014-07-29 NOTE — Addendum Note (Signed)
Addended by: Margorie John on: 07/29/2014 09:44 AM   Modules accepted: Orders

## 2014-07-30 ENCOUNTER — Encounter (HOSPITAL_COMMUNITY)

## 2014-07-31 ENCOUNTER — Other Ambulatory Visit: Payer: Self-pay | Admitting: Internal Medicine

## 2014-08-02 LAB — PORPHYRINS, FRACTIONATED URINE (TIMED COLLECTION)
COPROPOR(CP)III,24HR: 78 ug/(24.h) — AB (ref 0–74)
COPROPORPHYRIN (CP) III: 39 ug/L
Coproporph(CP)I,24hr: 34 ug/24 hr — ABNORMAL HIGH (ref 0–24)
Coproporphyrin (CP) I: 17 ug/L
HEPTACAB(7-CP),24HR: 4 ug/(24.h) (ref 0–4)
HEPTACARBOXYL (7-CP): 2 ug/L
Hexacarb(6-CP),24hr: <2 ug/24 hr (ref 0–1)
Hexacarboxyl (6-CP): <1 ug/L
Pentacarb(5-CP),24hr: <2 ug/24 hr (ref 0–4)
Pentacarboxyl (5-CP): <1 ug/L
Uroporph(UP),24hr: 12 ug/24 hr (ref 0–24)
Uroporphyrins (UP): 6 ug/L

## 2014-08-02 LAB — PORPHOBILINOGEN, 24 HR URINE-QUANT
PBG Ur-sCnc: 0.3 mg/L (ref 0.0–2.0)
PORPHOBILINOGEN 24H UR: 0.6 mg/(24.h) (ref 0.0–1.5)

## 2014-08-02 LAB — ALA DELTA, 24-HOUR URINE
Delta Ala, 24H Ur: 1.3 mg/L
Urine Volume: 2.6 mg/24 hr (ref 0.5–5.1)

## 2014-08-12 ENCOUNTER — Encounter: Payer: Self-pay | Admitting: Vascular Surgery

## 2014-08-12 IMAGING — CT CT HEAD W/O CM
1 series · 15 of 30 positions shown, 19 images · non-contrast
Comparison: None.

CLINICAL DATA: Loss of consciousness post trauma

CT HEAD WITHOUT CONTRAST
TECHNIQUE: Contiguous axial images were obtained from the base of
the skull through the vertex without contrast.

[Series 2: head 5.0 h30s · axial · 0.45mm/px · z∈[-144,+6]mm · 15 of 34 slices shown, 19 images]
[im 2/34  brain]
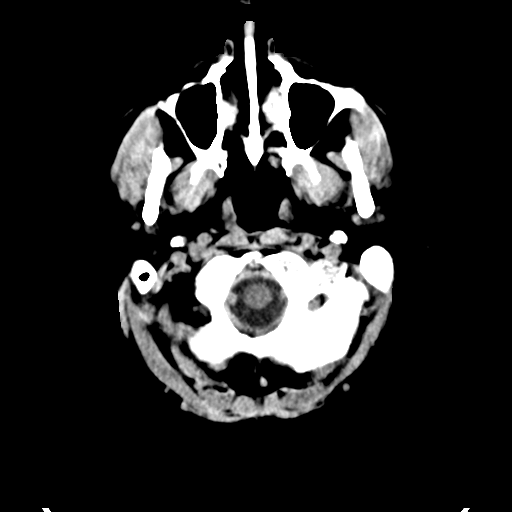
[im 2/34  bone]
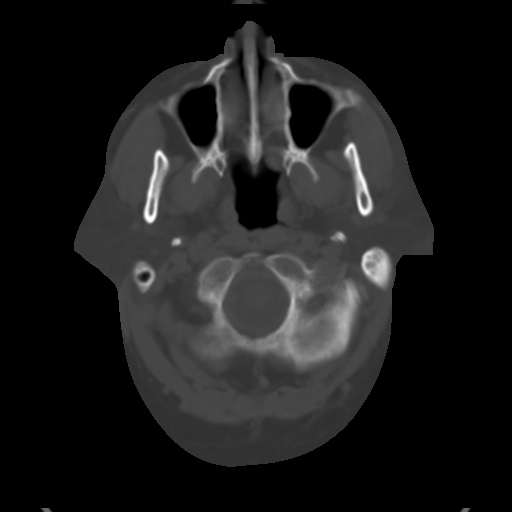
[im 4/34  brain]
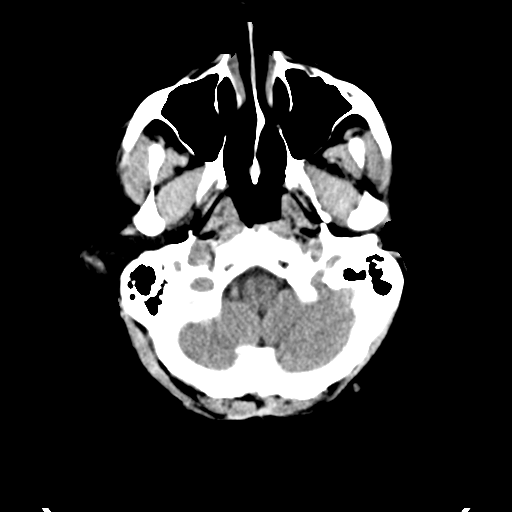
[im 6/34  brain]
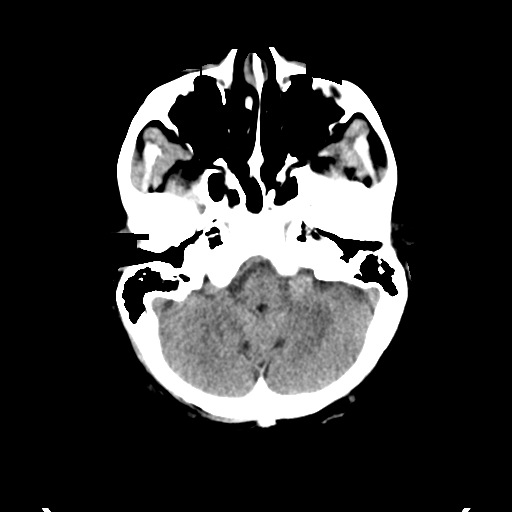
[im 8/34  brain]
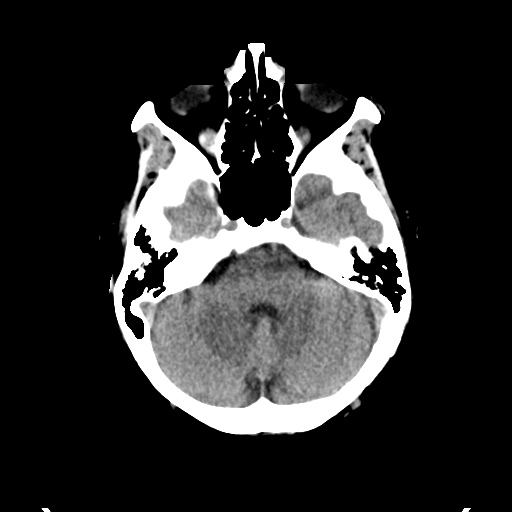
[im 11/34  brain]
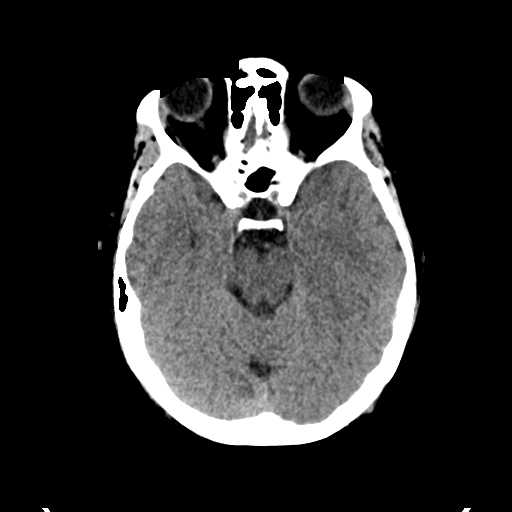
[im 11/34  bone]
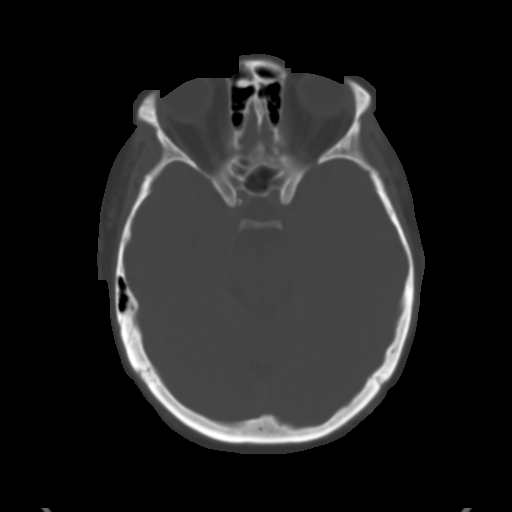
[im 13/34  brain]
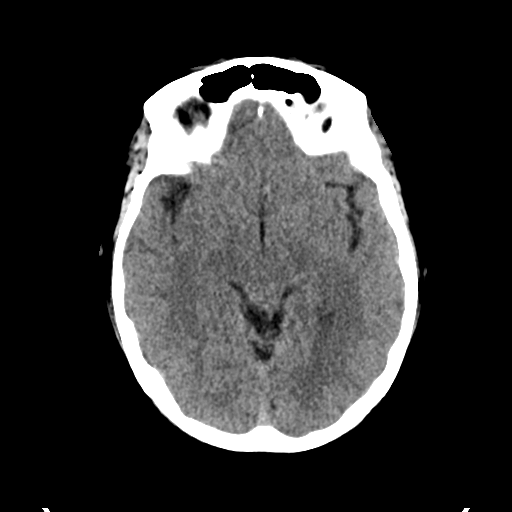
[im 15/34  brain]
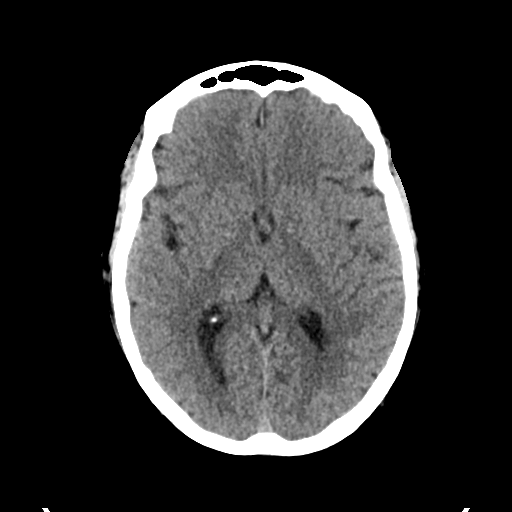
[im 18/34  brain]
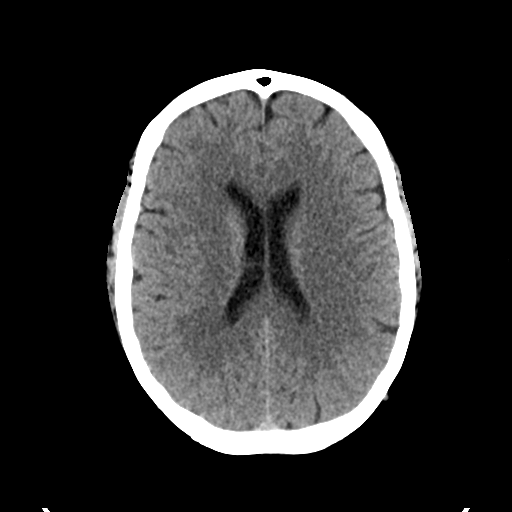
[im 19/34  brain]
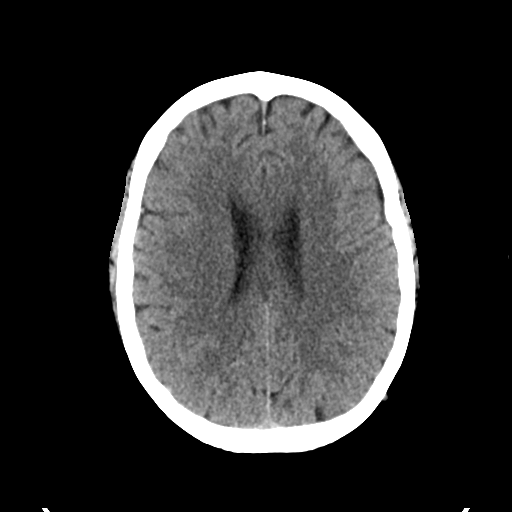
[im 19/34  bone]
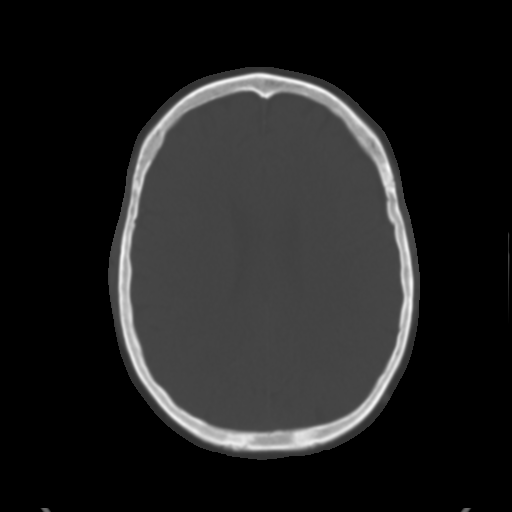
[im 21/34  brain]
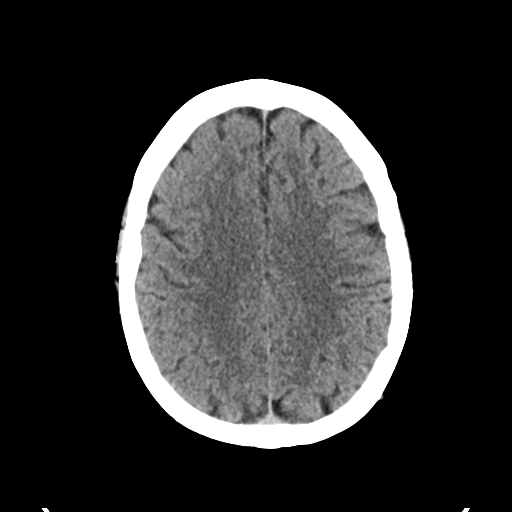
[im 23/34  brain]
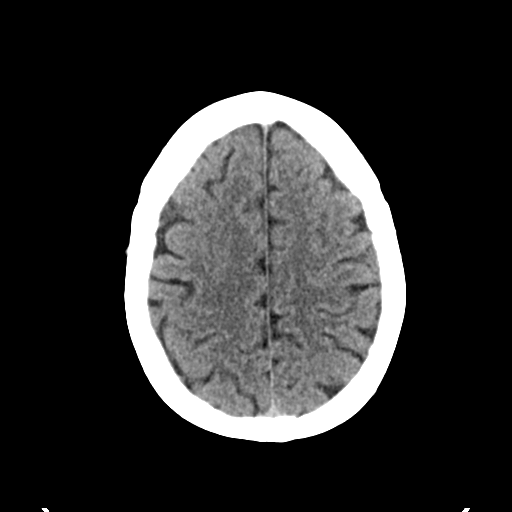
[im 26/34  brain]
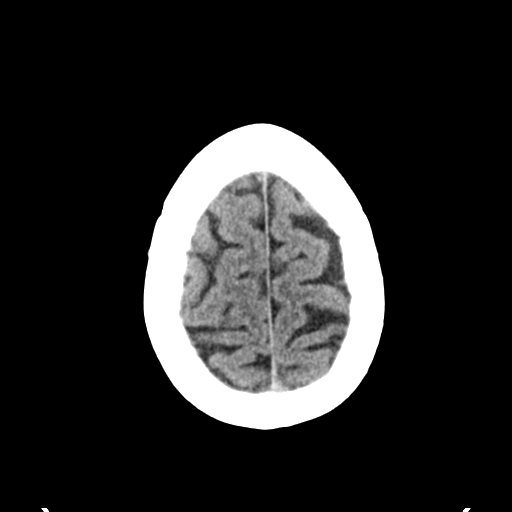
[im 28/34  brain]
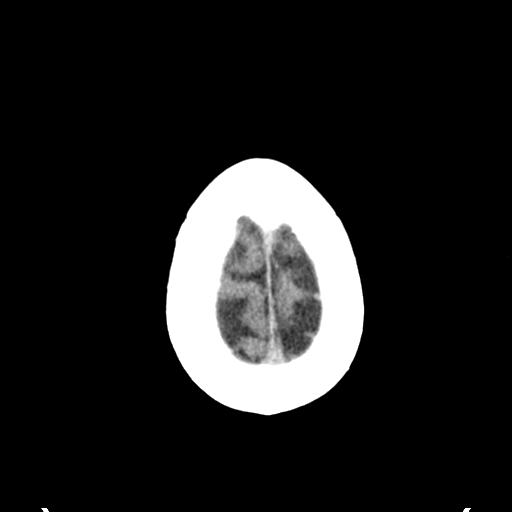
[im 28/34  bone]
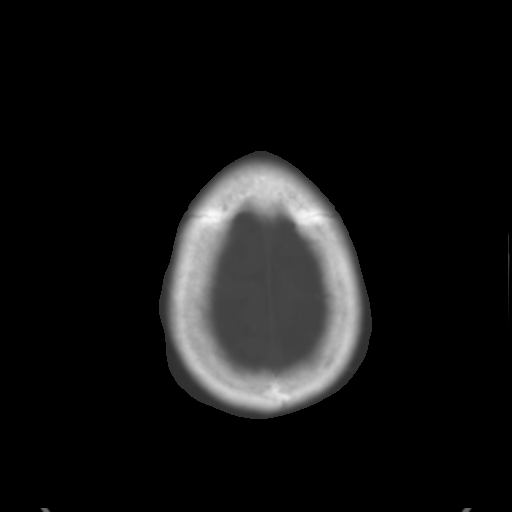
[im 30/34  brain]
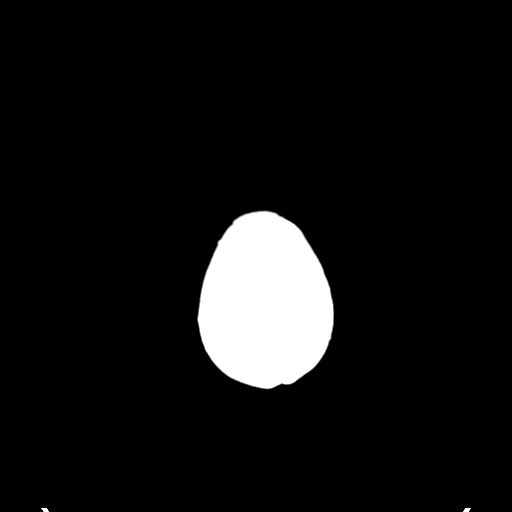
[im 32/34  brain]
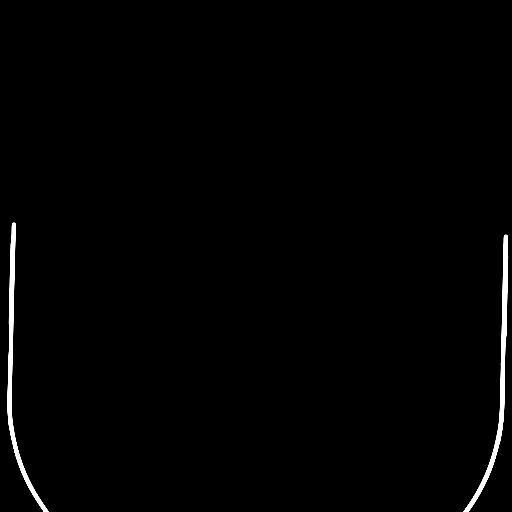

[15 of 30 positions shown; findings below may reference images not displayed]

FINDINGS: The ventricles are normal in size and configuration.

There is a small focus of increased attenuation in the lower right
pons laterally, best seen on slice nine.  This finding may be
artifactual.  However, a small focus of hemorrhage cannot be
entirely excluded in this area.  This focus measures 6 mm in size.

There is no other evidence suggesting potential hemorrhage.  There
is no extra-axial fluid collection or midline shift.  Elsewhere
gray-white compartments are normal.

Bony calvarium appears intact.  Mastoid air cells are clear.  There
is a minimal right occipital scalp hematoma.
IMPRESSION: Small area of increased attenuation in the lateral
lower right pons.  While this finding could represent artifact, a
small focus of hemorrhage cannot be entirely excluded in this area.
This finding may warrant correlation with MR to further assess.
Study is otherwise unremarkable except for a small right occipital
scalp hematoma.

Comment:  This report was communicated directly by phone to Dr.
Rathinam, emergency department physician, immediately upon review of
this study.

## 2014-08-12 IMAGING — CR DG THORACIC SPINE 2V
3 series · 3 of 3 positions shown · non-contrast
Comparison: None.

CLINICAL DATA: 61-year-old female with pain. Fall and loss of
consciousness.

EXAM:
THORACIC SPINE - 2 VIEW

[t t-spine a.p.]
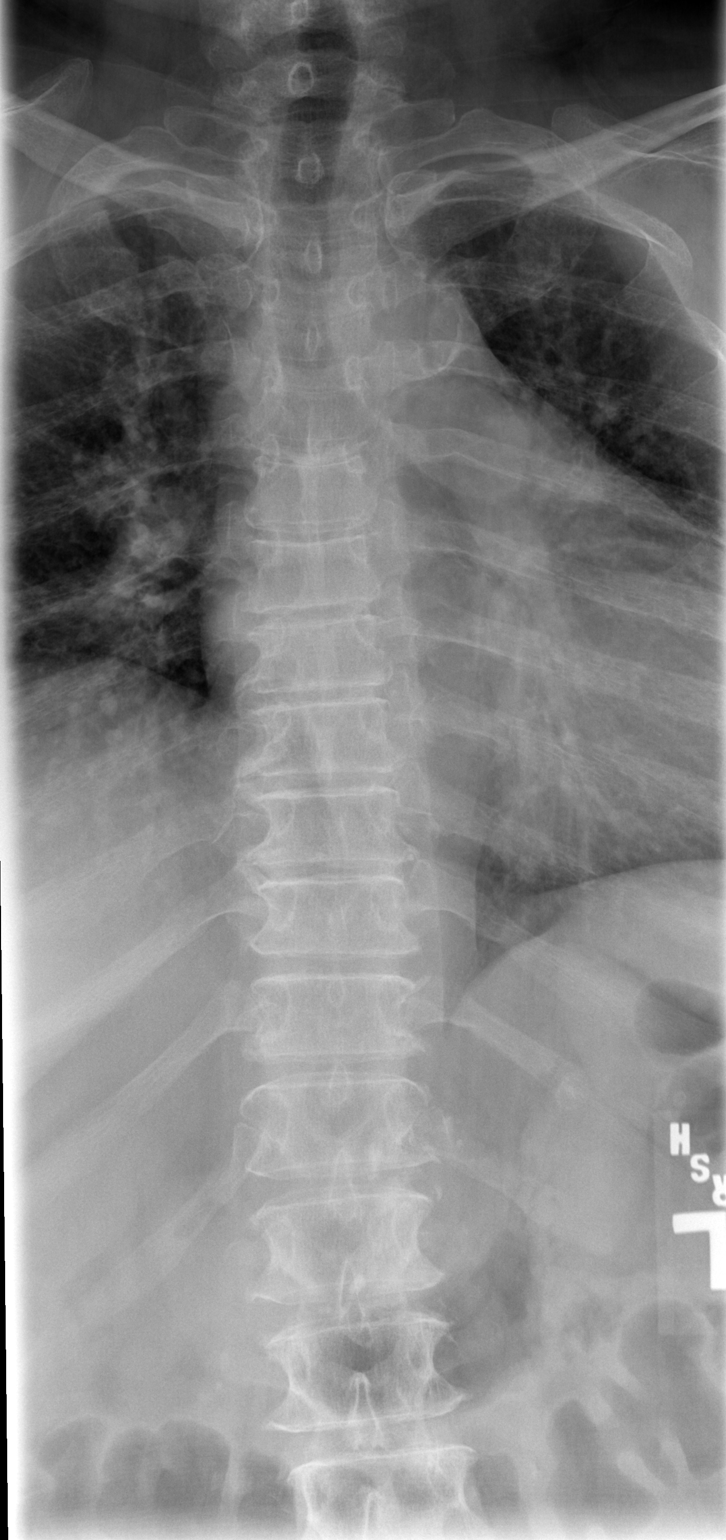

[t t-spine lat]
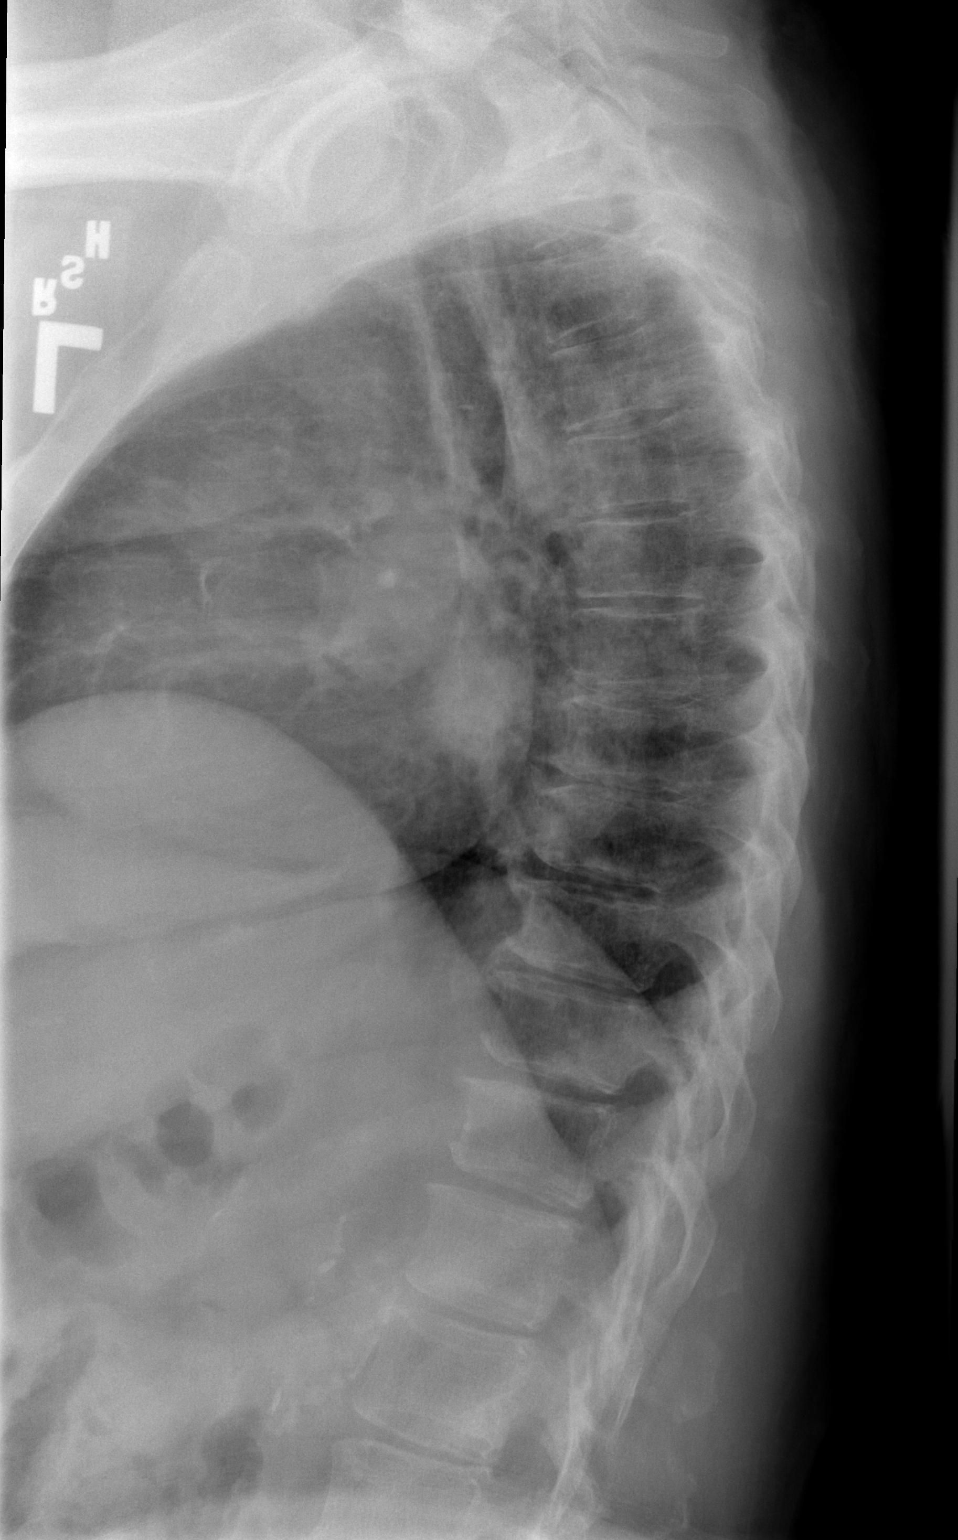

[t swimmers *]
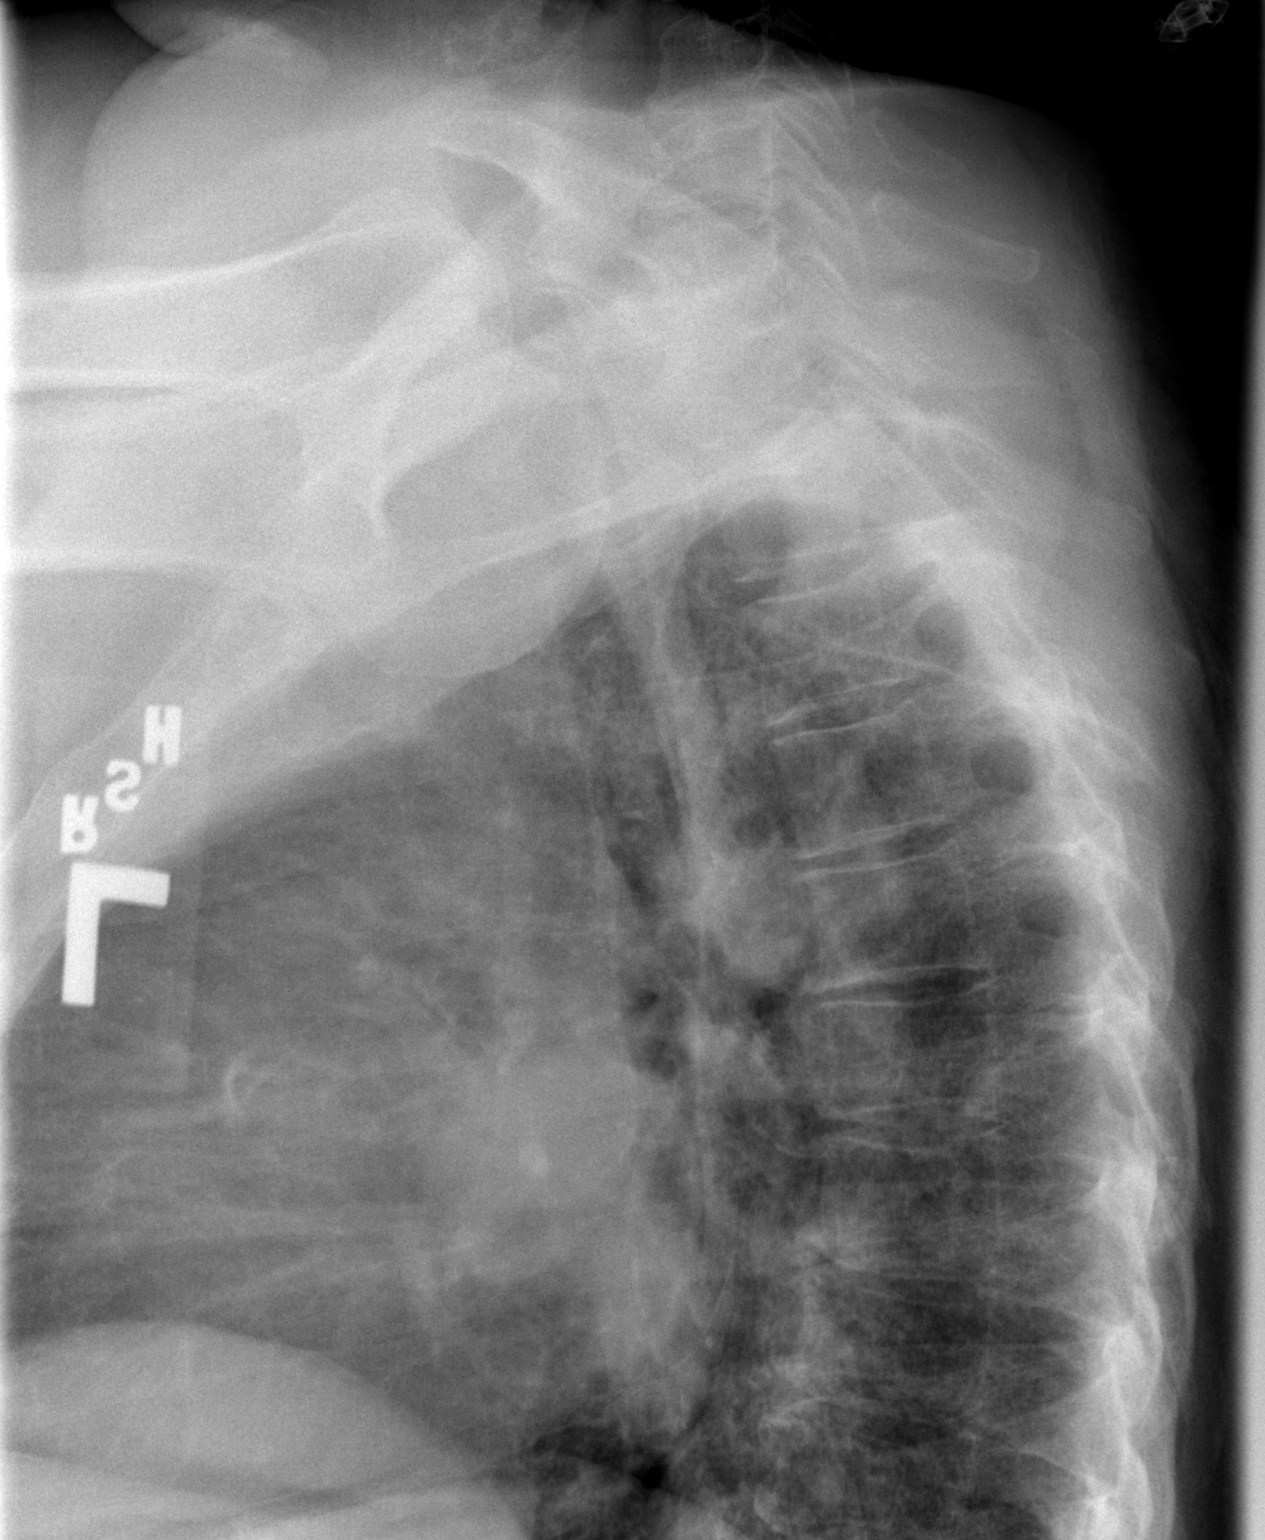

[3 of 3 positions shown; findings below may reference images not displayed]

FINDINGS: Normal thoracic segmentation. Thoracic vertebral height and
alignment within normal limits. Multilevel mid and lower thoracic
mild vertebral body endplate spurring. Cervicothoracic junction
alignment is within normal limits. Vascular calcifications including
of the visible abdominal aorta. Grossly negative visualized thoracic
visceral contours.
IMPRESSION: No acute fracture or listhesis identified in the thoracic spine.

## 2014-08-13 ENCOUNTER — Other Ambulatory Visit: Payer: Self-pay | Admitting: *Deleted

## 2014-08-13 ENCOUNTER — Encounter: Payer: Self-pay | Admitting: Vascular Surgery

## 2014-08-13 ENCOUNTER — Ambulatory Visit (HOSPITAL_COMMUNITY)
Admission: RE | Admit: 2014-08-13 | Discharge: 2014-08-13 | Disposition: A | Discharge status: Home / Self Care | Source: Ambulatory Visit | Attending: Vascular Surgery | Admitting: Vascular Surgery

## 2014-08-13 ENCOUNTER — Ambulatory Visit (INDEPENDENT_AMBULATORY_CARE_PROVIDER_SITE_OTHER): Admitting: Vascular Surgery

## 2014-08-13 VITALS — BP 124/80 | HR 75 | Temp 98.4°F | Resp 16 | Ht 61.5 in | Wt 203.0 lb

## 2014-08-13 DIAGNOSIS — I83899 Varicose veins of unspecified lower extremities with other complications: Secondary | ICD-10-CM | POA: Insufficient documentation

## 2014-08-13 DIAGNOSIS — I83893 Varicose veins of bilateral lower extremities with other complications: Secondary | ICD-10-CM | POA: Insufficient documentation

## 2014-08-13 DIAGNOSIS — I872 Venous insufficiency (chronic) (peripheral): Secondary | ICD-10-CM | POA: Diagnosis not present

## 2014-08-13 NOTE — Progress Notes (Signed)
Referred by:  Marletta Lor, MD [place], [place] [address]  Reason for referral: Bilateral leg swelling   History of Present Illness  Chelsea Moreno is a 64 y.o. (March 02, 1950) female who presents with chief complaint: bilateral leg swelling, left worse than right.  Patient notes, onset of swelling 5-6 months ago.  The patient's symptoms include: heaviness, aching and tenderness to left anterior shin and around lateral ankle. Her symptoms are worse at the end of the day. She reports a history of varicose veins for the past 30 years. The patient has had no history of DVT. She does have a previous history of pregnancy. She denies any history of venous stasis ulcers,  lymphedema and skin changes in lower legs.  There is a family history of venous disorders.  The patient has used compression stockings in the past. Her symptoms are improved with elevation.   She has a past medical history of hyperlipidemia, hypertension and diabetes insipidus.   Past Medical History  Diagnosis Date  . COLONIC POLYPS, HX OF 09/24/2008  . HYPERLIPIDEMIA 09/06/2006    takes Atorvasatin nightly  . OSTEOPOROSIS 09/24/2008    takes Actonel every 30days  . HYPERTENSION 09/06/2006    takes Lisinopril daily  . History of bronchitis     yrs ago  . History of migraine     last time many yrs ago  . Dizziness     when nauseated gets dizzy  . Bursitis   . Vitamin D deficiency     takes OTC Vit D  . DEPRESSION 09/23/2007    takes Lexapro daily but hasn't taken since Sept 2014  . Muscle spasms of head and/or neck   . Hemorrhoids   . Diarrhea   . Diabetes insipidus     takes Desmopressin bid  . Breast cancer 11/05/12    left  . Hx of radiation therapy 01/29/13- 03/16/13    left breast 4500 cGy 25 sessions, left breast boost 1000 cGy 5 sessions    Past Surgical History  Procedure Laterality Date  . Breast biopsy Left 57yrs ago  . Colonoscopy    . Breast lumpectomy with needle localization  Left 12/16/2012    Procedure: PARTIAL MASTECTOMY WITH NEEDLE LOCALIZATION;  Surgeon: Stark Klein, MD;  Location: [facility];  Service: General;  Laterality: Left;  1:00 NL at [place]     History   Social History  . Marital Status: Widowed    Spouse Name: N/A  . Number of Children: N/A  . Years of Education: N/A   Occupational History  . Not on file.   Social History Main Topics  . Smoking status: Former Smoker    Quit date: 01/07/1973  . Smokeless tobacco: Never Used     Comment: quit 61yrs ago  . Alcohol Use: Yes     Comment: socailly  . Drug Use: No  . Sexual Activity: Not Currently    Birth Control/ Protection: Post-menopausal     Comment: menarche age 52, first live birth age 79, menopause 2002, no HRT   Other Topics Concern  . Not on file   Social History Narrative    Family History  Problem Relation Age of Onset  . Ovarian cancer Mother   . Cancer Mother     Ovarian  . Heart disease Mother     Before age 69  . Heart attack Mother   . Varicose Veins Mother   . Heart disease Father  Before age 34  . Hyperlipidemia Father   . Hypertension Father   . Heart attack Father   . Heart disease Sister     Before age 82  . Hypertension Sister   . Heart attack Sister   . Heart disease Brother     Before age 29  . Hypertension Brother   . Heart attack Brother     Current Outpatient Prescriptions on File Prior to Visit  Medication Sig Dispense Refill  . atorvastatin (LIPITOR) 80 MG tablet TAKE ONE TABLET BY MOUTH ONE TIME DAILY 90 tablet 1  . bimatoprost (LUMIGAN) 0.03 % ophthalmic drops Place 1 drop into both eyes at bedtime.     Marland Kitchen desmopressin (DDAVP NASAL) 0.01 % solution USE ONE SPRAY IN EACH NOSTRIL TWICE DAILY 5 mL 2  . escitalopram (LEXAPRO) 10 MG tablet Take 1 tablet (10 mg total) by mouth daily. 90 tablet 4  . lisinopril (PRINIVIL,ZESTRIL) 20 MG tablet TAKE ONE TABLET BY MOUTH ONE TIME DAILY 90 tablet 0  . ondansetron (ZOFRAN) 4 MG tablet Take 4 mg by mouth  every 8 (eight) hours as needed for nausea or vomiting.    . potassium chloride SA (K-DUR,KLOR-CON) 20 MEQ tablet Take 1 tablet (20 mEq total) by mouth 2 (two) times daily. 90 tablet 6  . tamoxifen (NOLVADEX) 20 MG tablet Take 1 tablet (20 mg total) by mouth daily. 90 tablet 1  . timolol (BETIMOL) 0.5 % ophthalmic solution Place 1 drop into both eyes daily.     No current facility-administered medications on file prior to visit.    No Known Allergies  REVIEW OF SYSTEMS:  (Positives checked otherwise negative)  CARDIOVASCULAR:  []  chest pain, []  chest pressure, []  palpitations, []  shortness of breath when laying flat, []  shortness of breath with exertion,  []  pain in feet when walking, []  pain in feet when laying flat, []  history of blood clot in veins (DVT), []  history of phlebitis, [x]  swelling in legs, [x]  varicose veins  PULMONARY:  []  productive cough, []  asthma, []  wheezing  NEUROLOGIC:  []  weakness in arms or legs, []  numbness in arms or legs, []  difficulty speaking or slurred speech, []  temporary loss of vision in one eye, []  dizziness  HEMATOLOGIC:  []  bleeding problems, []  problems with blood clotting too easily  MUSCULOSKEL:  []  joint pain, []  joint swelling  GASTROINTEST:  []  vomiting blood, []  blood in stool     GENITOURINARY:  []  burning with urination, []  blood in urine  PSYCHIATRIC:  []  history of major depression  INTEGUMENTARY:  []  rashes, []  ulcers  CONSTITUTIONAL:  []  fever, []  chills   Physical Examination Filed Vitals:   08/13/14 1506  BP: 124/80  Pulse: 75  Temp: 98.4 F (36.9 C)  TempSrc: Oral  Resp: 16  Height: 5' 1.5" (1.562 m)  Weight: 203 lb (92.08 kg)  SpO2: 96%   Body mass index is 37.74 kg/(m^2).  General: A&O x 3, WDWN obese female in NAD  Head: [place]/AT  Neck: Supple, no nuchal rigidity, no palpable LAD  Pulmonary: Sym exp, good air movt, CTAB, no rales, rhonchi, & wheezing  Cardiac: RRR, Nl S1, S2, no Murmurs, rubs or gallops, no  carotid bruits.   Vascular: 2+ radial and dorsalis pedis pulses bilaterally  Musculoskeletal: 1+ bilateral lower extremity swelling. Large tortuous varicosity to right anterior shin and left medial leg. No hemosiderin staining. Few spider veins. M/S 5/5 throughout. , Extremities without ischemic changes  Neurologic: CN 2-12 intact. Pain  and light touch intact in extremities, Motor exam as listed above  Psychiatric: Judgment intact, Mood & affect appropriate for pt's clinical situation  Dermatologic: See M/S exam for extremity exam, no rashes otherwise noted  Lymph: no obvious inguinal LAD   Non-Invasive Vascular Imaging  BLE Venous Insufficiency Duplex (Date: 08/13/2014):   RLE: negative DVT and SVT, +GSV reflux (0.43cm at knee to 0.76 at proximal thigh), +deep venous reflux  LLE: negative DVT and SVT, +GSV reflux (0.46cm at knee to 0.64 at proximal thigh), +deep venous reflux  Medical Decision Making  Chelsea Moreno is a 64 y.o. female who presents with: BLE chronic venous insufficiency (C2), left worse than right.   Based on the patient's history and examination, I recommend: 20-30 mmHg compression therapy.   She is a candidate for endovenous laser ablation of the great saphenous veins bilaterally but she is not interested at this time.   I discussed with the patient the use of her 20-30 mm thigh high compression stockings and need for 3 month trial of such if she chose to proceed with laser ablation.   She will follow up as needed.   Virgina Jock, PA-C [facility] Office: [phone] Pager: [phone]  08/13/2014, 3:59 PM  This patient was seen and examined in conjunction with Dr. Bridgett Larsson  Addendum  I have independently interviewed and examined the patient, and I agree with the physician assistant's findings.  Pt has large prominent varicosities.  I'm not certain that her L lateral calf sx are related to her CVI but she definitely has  some classic L leg swelling with aching associated with prolonged standing.  She will going to try compressive therapy and follow up as needed for a further evaluation in the [place].    Adele Barthel, MD [facility] Office: [phone] Pager: [phone]  08/13/2014, 5:24 PM

## 2014-08-17 ENCOUNTER — Telehealth: Payer: Self-pay | Admitting: *Deleted

## 2014-08-17 NOTE — Telephone Encounter (Signed)
-----   Message from Britt Bottom, MD sent at 08/17/2014  8:11 AM EDT ----- Please let her know the urine labs for porphyria were ok.

## 2014-08-17 NOTE — Telephone Encounter (Signed)
I have spoken with Chelsea Moreno this afternoon and per RAS, advised urine tests for porphyria were ok.  She verbalized understanding of same/fim

## 2014-08-25 ENCOUNTER — Ambulatory Visit (INDEPENDENT_AMBULATORY_CARE_PROVIDER_SITE_OTHER): Admitting: Neurology

## 2014-08-25 ENCOUNTER — Encounter: Payer: Self-pay | Admitting: Neurology

## 2014-08-25 VITALS — BP 128/86 | HR 64 | Resp 14 | Ht 61.5 in | Wt 205.4 lb

## 2014-08-25 DIAGNOSIS — R404 Transient alteration of awareness: Secondary | ICD-10-CM | POA: Diagnosis not present

## 2014-08-25 DIAGNOSIS — R4020 Unspecified coma: Secondary | ICD-10-CM

## 2014-08-25 DIAGNOSIS — R1084 Generalized abdominal pain: Secondary | ICD-10-CM

## 2014-08-25 NOTE — Progress Notes (Signed)
[facility]  PATIENT: Chelsea Moreno DOB: December 13, 1950  REFERRING DOCTOR OR PCP:  Dr. Bluford Kaufmann.   Also sees Dr. Stark Klein (surgery) SOURCE: patient and records from EMR and MRI images on PACS  _________________________________   HISTORICAL  CHIEF COMPLAINT:  Chief Complaint  Patient presents with  . Loss of Consciousness    Chelsea Moreno denies further syncopal episodes.  Denies new sx.  Porphyria labs were normal/fim    HISTORY OF PRESENT ILLNESS:  Chelsea Moreno is a 64 yo woman with spells of severe nausea, abdominal pain and sometimes syncope which typically occur around 2-4 am and last about 2 hours.    Only one spell occurred during the day and was associated with fasting for surgery.   She has never noted that the prior days were unusual and she thinks dietary intake was normal.  Sodium was low during one episodes.    I checked her for porphyria and labs were essentially normal.    Typical episode:   She wakes up with a headache.   She then had waves of nausea, cold tingling in the upper body into the shoulders.  The entire episode lasts 90 minutes or so but she has multiple waves of the sensory symptoms lasting 30-60 seconds during that time separated every 8-10 minutes with a more constant nausea.  She has to rush to the bathroom due to diarrhea and she has vomiting.   She just lays on the floor of the bathroom.   During the episode, she vomits about 4 times and had diarrhea multiple times.   She has had about 20 episodes over the past 10 years and has had syncope with about a quarter of them.    Because of the passing out, she tries to lay on the floor of the bathroom in between the spasms of symptoms.        In between the episodes, she feels completely fine.    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 Shirlee Limerick)  MRI of the brain dated 10/07/2012 shows small vessel ischemic changes with  hyperintense foci in the deep white matter more than in the periventricular and subcortical white matter. This is a nonspecific finding but the extent was more than expected for age.  REVIEW OF SYSTEMS: Constitutional: No fevers, chills, sweats, or change in appetite Eyes: No visual changes, double vision, eye pain Ear, nose and throat: No hearing loss, ear pain, nasal congestion, sore throat Cardiovascular: No chest pain, palpitations Respiratory: No shortness of breath at rest or with exertion.   No wheezes GastrointestinaI: No nausea, vomiting, diarrhea, abdominal pain, fecal incontinence Genitourinary: No dysuria, urinary retention or frequency.  No nocturia. Musculoskeletal: No neck pain, back pain Integumentary: No rash, pruritus, skin lesions Neurological: as above Psychiatric: No depression at this time.  No anxiety Endocrine: No palpitations, diaphoresis, change in appetite, change in weigh or increased thirst Hematologic/Lymphatic: No anemia, purpura, petechiae. Allergic/Immunologic: No itchy/runny eyes, nasal congestion, recent allergic reactions, rashes  ALLERGIES: No Known Allergies  HOME MEDICATIONS:  Current outpatient prescriptions:  .  atorvastatin (LIPITOR) 80 MG tablet, TAKE ONE TABLET BY MOUTH ONE TIME DAILY, Disp: 90 tablet, Rfl: 1 .  bimatoprost (LUMIGAN) 0.03 % ophthalmic drops, Place 1 drop into both eyes at bedtime. , Disp: , Rfl:  .  desmopressin (DDAVP NASAL) 0.01 % solution, USE ONE SPRAY IN EACH NOSTRIL TWICE DAILY, Disp: 5 mL, Rfl:  2 .  escitalopram (LEXAPRO) 10 MG tablet, Take 1 tablet (10 mg total) by mouth daily., Disp: 90 tablet, Rfl: 4 .  lisinopril (PRINIVIL,ZESTRIL) 20 MG tablet, TAKE ONE TABLET BY MOUTH ONE TIME DAILY, Disp: 90 tablet, Rfl: 0 .  potassium chloride SA (K-DUR,KLOR-CON) 20 MEQ tablet, Take 1 tablet (20 mEq total) by mouth 2 (two) times daily., Disp: 90 tablet, Rfl: 6 .  tamoxifen (NOLVADEX) 20 MG tablet, Take 1 tablet (20 mg total)  by mouth daily., Disp: 90 tablet, Rfl: 1 .  timolol (BETIMOL) 0.5 % ophthalmic solution, Place 1 drop into both eyes daily., Disp: , Rfl:  .  ondansetron (ZOFRAN) 4 MG tablet, Take 4 mg by mouth every 8 (eight) hours as needed for nausea or vomiting., Disp: , Rfl:   PAST MEDICAL HISTORY: Past Medical History  Diagnosis Date  . COLONIC POLYPS, HX OF 09/24/2008  . HYPERLIPIDEMIA 09/06/2006    takes Atorvasatin nightly  . OSTEOPOROSIS 09/24/2008    takes Actonel every 30days  . HYPERTENSION 09/06/2006    takes Lisinopril daily  . History of bronchitis     yrs ago  . History of migraine     last time many yrs ago  . Dizziness     when nauseated gets dizzy  . Bursitis   . Vitamin D deficiency     takes OTC Vit D  . DEPRESSION 09/23/2007    takes Lexapro daily but hasn't taken since Sept 2014  . Muscle spasms of head and/or neck   . Hemorrhoids   . Diarrhea   . Diabetes insipidus     takes Desmopressin bid  . Breast cancer 11/05/12    left  . Hx of radiation therapy 01/29/13- 03/16/13    left breast 4500 cGy 25 sessions, left breast boost 1000 cGy 5 sessions    PAST SURGICAL HISTORY: Past Surgical History  Procedure Laterality Date  . Breast biopsy Left 51yrs ago  . Colonoscopy    . Breast lumpectomy with needle localization Left 12/16/2012    Procedure: PARTIAL MASTECTOMY WITH NEEDLE LOCALIZATION;  Surgeon: Stark Klein, MD;  Location: [facility];  Service: General;  Laterality: Left;  1:00 NL at [place]: Family History  Problem Relation Age of Onset  . Ovarian cancer Mother   . Cancer Mother     Ovarian  . Heart disease Mother     Before age 45  . Heart attack Mother   . Varicose Veins Mother   . Heart disease Father     Before age 66  . Hyperlipidemia Father   . Hypertension Father   . Heart attack Father   . Heart disease Sister     Before age 63  . Hypertension Sister   . Heart attack Sister   . Heart disease Brother     Before age 7  .  Hypertension Brother   . Heart attack Brother     SOCIAL HISTORY:  Social History   Social History  . Marital Status: Widowed    Spouse Name: N/A  . Number of Children: N/A  . Years of Education: N/A   Occupational History  . Not on file.   Social History Main Topics  . Smoking status: Former Smoker    Quit date: 01/07/1973  . Smokeless tobacco: Never Used     Comment: quit 34yrs ago  . Alcohol Use: Yes     Comment: socailly  . Drug Use: No  . Sexual Activity:  Not Currently    Birth Control/ Protection: Post-menopausal     Comment: menarche age 50, first live birth age 28, menopause 2002, no HRT   Other Topics Concern  . Not on file   Social History Narrative     PHYSICAL EXAM  Filed Vitals:   08/25/14 1128  BP: 128/86  Pulse: 64  Resp: 14  Height: 5' 1.5" (1.562 m)  Weight: 205 lb 6.4 oz (93.169 kg)    Body mass index is 38.19 kg/(m^2).   General: The patient is well-developed and well-nourished and in no acute distress  Neurologic Exam  Mental status: The patient is alert and oriented x 3 at the time of the examination. The patient has apparent normal recent and remote memory, with an apparently normal attention span and concentration ability.   Speech is normal.  Cranial nerves: Extraocular movements are full. Facial symmetry is present. There is good facial sensation to soft touch bilaterally.Facial strength is normal.  Trapezius and sternocleidomastoid strength is normal. No dysarthria is noted.    No obvious hearing deficits are noted.  Motor:  Muscle bulk is normal.  Strength is  5 / 5 in all 4 extremities.   Sensory: Sensory testing is intact to touch and vibration sensation in all 4 extremities.  Coordination: Cerebellar testing reveals good finger-nose-finger bilaterally.  Gait and station: Station is normal.   Gait is normal. Tandem gait is normal.   Reflexes: Deep tendon reflexes are symmetric and normal bilaterally.      DIAGNOSTIC  DATA (LABS, IMAGING, TESTING) - I reviewed patient records, labs, notes, testing and imaging myself where available.  Lab Results  Component Value Date   WBC 5.5 05/20/2014   HGB 13.8 05/20/2014   HCT 38.8 05/20/2014   MCV 88.2 05/20/2014   PLT 236 05/20/2014      Component Value Date/Time   NA 140 05/20/2014 1413   NA 127* 06/29/2013 1639   K 4.2 05/20/2014 1413   K 3.7 06/29/2013 1639   CL 93* 06/29/2013 1639   CO2 24 05/20/2014 1413   CO2 27 06/29/2013 1639   GLUCOSE 111 05/20/2014 1413   GLUCOSE 113* 06/29/2013 1639   BUN 9.0 05/20/2014 1413   BUN 9 06/29/2013 1639   CREATININE 0.7 05/20/2014 1413   CREATININE 0.4 06/29/2013 1639   CALCIUM 9.1 05/20/2014 1413   CALCIUM 8.8 06/29/2013 1639   PROT 7.2 05/20/2014 1413   PROT 6.4 06/29/2013 1639   ALBUMIN 4.1 05/20/2014 1413   ALBUMIN 4.1 06/29/2013 1639   AST 37* 05/20/2014 1413   AST 27 06/29/2013 1639   ALT 34 05/20/2014 1413   ALT 20 06/29/2013 1639   ALKPHOS 66 05/20/2014 1413   ALKPHOS 52 06/29/2013 1639   BILITOT 0.74 05/20/2014 1413   BILITOT 0.5 06/29/2013 1639   GFRNONAA >90 12/10/2012 0935   GFRAA >90 12/10/2012 0935   Lab Results  Component Value Date   CHOL 186 07/21/2012   HDL 42.10 07/21/2012   LDLCALC 108* 07/21/2012   LDLDIRECT 171.8 03/21/2011   TRIG 180.0* 07/21/2012   CHOLHDL 4 07/21/2012   No results found for: HGBA1C No results found for: VITAMINB12 Lab Results  Component Value Date   TSH 0.80 07/21/2012       ASSESSMENT AND PLAN  Loss of consciousness  Generalized abdominal pain   1.    I am uncertain what is causing her spells. The porphyria labs were essentially normal. As she seems to be fasting during the episodes,  usually because they occur at night, I advised her to try to take a sugary drink the onset of the spell occurs. 2.   She will return to see me as needed.  Paizlee Kinder A. Felecia Shelling, MD, PhD 0/48/8891, 69:45 AM Certified in Neurology, Clinical Neurophysiology, Sleep  Medicine, Pain Medicine and Neuroimaging  [facility] [address], [place], [place] [address] [phone]

## 2014-08-30 ENCOUNTER — Ambulatory Visit (INDEPENDENT_AMBULATORY_CARE_PROVIDER_SITE_OTHER): Admitting: Pulmonary Disease

## 2014-08-30 ENCOUNTER — Encounter: Payer: Self-pay | Admitting: Pulmonary Disease

## 2014-08-30 VITALS — BP 138/86 | HR 74 | Ht 62.0 in | Wt 202.4 lb

## 2014-08-30 DIAGNOSIS — G4733 Obstructive sleep apnea (adult) (pediatric): Secondary | ICD-10-CM | POA: Diagnosis not present

## 2014-08-30 NOTE — Progress Notes (Signed)
   Subjective:    Patient ID: Chelsea Moreno, female    DOB: Mar 29, 1950, 64 y.o.   MRN: [id number]  HPI    Review of Systems  Constitutional: Negative for fever and unexpected weight change.  HENT: Negative for congestion, dental problem, ear pain, nosebleeds, postnasal drip, rhinorrhea, sinus pressure, sneezing, sore throat and trouble swallowing.   Eyes: Negative for redness and itching.  Respiratory: Positive for cough ( dry with some congestion at times). Negative for chest tightness, shortness of breath and wheezing.   Cardiovascular: Positive for leg swelling. Negative for palpitations.  Gastrointestinal: Negative for nausea and vomiting.  Genitourinary: Negative for dysuria.  Musculoskeletal: Negative for joint swelling.  Skin: Negative for rash.  Neurological: Negative for headaches.  Hematological: Does not bruise/bleed easily.  Psychiatric/Behavioral: Positive for dysphoric mood. The patient is not nervous/anxious.        Objective:   Physical Exam        Assessment & Plan:

## 2014-08-30 NOTE — Progress Notes (Signed)
Chief Complaint  Patient presents with  . SLEEP CONSULT    Referred by Dr Burnice Logan for poss OSA. Epworth Score: 12    History of Present Illness: Chelsea Moreno is a 64 y.o. female for evaluation of sleep problems.  She has noticed trouble with her sleep for years.  Her husband would tell her that she snores, and would stop breathing and make funny noises while sleeping.  She is a restless sleeper.  She wakes up frequently, and will have trouble falling back to sleep.  She sleeps on her stomach, and her mouth gets dry at night.  She can fall asleep easily during the day when she sits quiet.  She goes to sleep at 11 pm.  She falls asleep after 30 minutes to an hour.  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  She gets out of bed at 9 am.  She feels tired in the morning.  She denies morning headache.  She does not use anything to help her fall sleep or stay awake.  She denies sleep walking, sleep talking, bruxism, or nightmares.  There is no history of restless legs.  She denies sleep hallucinations, sleep paralysis, or cataplexy.  The Epworth score is 12 out of 24.   Chelsea Moreno  has a past medical history of COLONIC POLYPS, HX OF (09/24/2008); HYPERLIPIDEMIA (09/06/2006); OSTEOPOROSIS (09/24/2008); HYPERTENSION (09/06/2006); History of bronchitis; History of migraine; Dizziness; Bursitis; Vitamin D deficiency; DEPRESSION (09/23/2007); Muscle spasms of head and/or neck; Hemorrhoids; Diarrhea; Diabetes insipidus; Breast cancer (11/05/12); and radiation therapy (01/29/13- 03/16/13).  Chelsea Moreno  has past surgical history that includes Breast biopsy (Left, 39yrs ago); Colonoscopy; and Breast lumpectomy with needle localization (Left, 12/16/2012).  Prior to Admission medications   Medication Sig Start Date End Date Taking? Authorizing Provider  atorvastatin (LIPITOR) 80 MG tablet TAKE ONE TABLET BY MOUTH ONE TIME DAILY 08/02/14  Yes Marletta Lor, MD  desmopressin (DDAVP NASAL) 0.01 % solution USE ONE SPRAY IN EACH NOSTRIL TWICE DAILY 07/05/14  Yes Marletta Lor, MD  escitalopram (LEXAPRO) 10 MG tablet Take 1 tablet (10 mg total) by mouth daily. 06/02/14  Yes Marletta Lor, MD  latanoprost (XALATAN) 0.005 % ophthalmic solution Place 1 drop into both eyes at bedtime.   Yes Historical Provider, MD  lisinopril (PRINIVIL,ZESTRIL) 20 MG tablet TAKE ONE TABLET BY MOUTH ONE TIME DAILY 05/06/14  Yes Marletta Lor, MD  ondansetron (ZOFRAN) 4 MG tablet Take 4 mg by mouth every 8 (eight) hours as needed for nausea or vomiting.   Yes Historical Provider, MD  potassium chloride SA (K-DUR,KLOR-CON) 20 MEQ tablet Take 1 tablet (20 mEq total) by mouth 2 (two) times daily. 01/14/12  Yes Marletta Lor, MD  tamoxifen (NOLVADEX) 20 MG tablet Take 1 tablet (20 mg total) by mouth daily. 04/01/14  Yes Nicholas Lose, MD  timolol (BETIMOL) 0.5 % ophthalmic solution Place 1 drop into both eyes daily.   Yes Historical Provider, MD    No Known Allergies  Her family history includes Cancer in her mother; Heart attack in her brother, father, mother, and sister; Heart disease in her brother, father, mother, and sister; Hyperlipidemia in her father; Hypertension in her brother, father, and sister; Ovarian cancer in her mother; Varicose Veins in her mother.  She  reports that she quit smoking about 41 years ago. Her smoking use included Cigarettes. She has a 2 pack-year  smoking history. She has never used smokeless tobacco. She reports that she drinks alcohol. She reports that she does not use illicit drugs.  Review of Systems  Constitutional: Negative for fever and unexpected weight change.  HENT: Negative for congestion, dental problem, ear pain, nosebleeds, postnasal drip, rhinorrhea, sinus pressure, sneezing, sore throat and trouble swallowing.   Eyes: Negative for redness and itching.  Respiratory: Positive for cough ( dry with some  congestion at times). Negative for chest tightness, shortness of breath and wheezing.   Cardiovascular: Positive for leg swelling. Negative for palpitations.  Gastrointestinal: Negative for nausea and vomiting.  Genitourinary: Negative for dysuria.  Musculoskeletal: Negative for joint swelling.  Skin: Negative for rash.  Neurological: Negative for headaches.  Hematological: Does not bruise/bleed easily.  Psychiatric/Behavioral: Positive for dysphoric mood. The patient is not nervous/anxious.    Physical Exam: BP 138/86 mmHg  Pulse 74  Ht 5\' 2"  (1.575 m)  Wt 202 lb 6.4 oz (91.808 kg)  BMI 37.01 kg/m2  SpO2 98%  General - No distress ENT - No sinus tenderness, no oral exudate, no LAN, no thyromegaly, TM clear, pupils equal/reactive, MP 3, elongated uvula, high arched palate Cardiac - s1s2 regular, no murmur, pulses symmetric Chest - No wheeze/rales/dullness, good air entry, normal respiratory excursion Back - No focal tenderness Abd - Soft, non-tender, no organomegaly, + bowel sounds Ext - No edema Neuro - Normal strength, cranial nerves intact Skin - No rashes Psych - Normal mood, and behavior  Discussion: She has snoring, sleep disruption, witnessed apnea, and daytime sleepiness.  She has hx of HTN and depression.  I am concerned she could have sleep apnea.  Her BMI is > 35.  We discussed how sleep apnea can affect various health problems including risks for hypertension, cardiovascular disease, and diabetes.  We also discussed how sleep disruption can increase risks for accident, such as while driving.  Weight loss as a means of improving sleep apnea was also reviewed.  Additional treatment options discussed were CPAP therapy, oral appliance, and surgical intervention.   Assessment/plan:  Obstructive sleep apnea. Plan: - will arrange for home sleep study pending insurance approval  Obesity. Plan: - discussed the importance of weight loss    Chesley Mires, M.D. Pager  [phone]

## 2014-08-30 NOTE — Patient Instructions (Signed)
Will arrange for home sleep study Will call to arrange for follow up after sleep study reviewed  

## 2014-08-30 NOTE — Progress Notes (Signed)
   Subjective:    Patient ID: Chelsea Moreno, female    DOB: [birth year], 64 y.o.   MRN: [id number]  HPI    Review of Systems  Constitutional: Negative for fever and unexpected weight change.  HENT: Negative for congestion, dental problem, ear pain, nosebleeds, postnasal drip, rhinorrhea, sinus pressure, sneezing, sore throat and trouble swallowing.   Eyes: Negative for redness and itching.  Respiratory: Positive for cough ( dry with some congestion at times). Negative for chest tightness, shortness of breath and wheezing.   Cardiovascular: Positive for leg swelling. Negative for palpitations.  Gastrointestinal: Negative for nausea and vomiting.  Genitourinary: Negative for dysuria.  Musculoskeletal: Negative for joint swelling.  Skin: Negative for rash.  Neurological: Negative for headaches.  Hematological: Does not bruise/bleed easily.  Psychiatric/Behavioral: Positive for dysphoric mood. The patient is not nervous/anxious.        Objective:   Physical Exam        Assessment & Plan:

## 2014-09-01 ENCOUNTER — Other Ambulatory Visit (INDEPENDENT_AMBULATORY_CARE_PROVIDER_SITE_OTHER)

## 2014-09-01 DIAGNOSIS — Z0000 Encounter for general adult medical examination without abnormal findings: Secondary | ICD-10-CM | POA: Diagnosis not present

## 2014-09-01 DIAGNOSIS — G4733 Obstructive sleep apnea (adult) (pediatric): Secondary | ICD-10-CM | POA: Diagnosis not present

## 2014-09-01 LAB — BASIC METABOLIC PANEL
BUN: 7 mg/dL (ref 6–23)
CALCIUM: 9.1 mg/dL (ref 8.4–10.5)
CHLORIDE: 103 meq/L (ref 96–112)
CO2: 30 meq/L (ref 19–32)
CREATININE: 0.6 mg/dL (ref 0.40–1.20)
GFR: 107.05 mL/min (ref >60.00)
GLUCOSE: 104 mg/dL — AB (ref 70–99)
Potassium: 4.2 mEq/L (ref 3.5–5.1)
Sodium: 142 mEq/L (ref 135–145)

## 2014-09-01 LAB — POCT URINALYSIS DIPSTICK
Bilirubin, UA: NEGATIVE
Glucose, UA: NEGATIVE
Ketones, UA: NEGATIVE
NITRITE UA: NEGATIVE
Protein, UA: NEGATIVE
RBC UA: NEGATIVE
SPEC GRAV UA: 1.02
UROBILINOGEN UA: 0.2
pH, UA: 6

## 2014-09-01 LAB — CBC WITH DIFFERENTIAL/PLATELET
BASOS ABS: 0 10*3/uL (ref 0.0–0.1)
BASOS PCT: 0.9 % (ref 0.0–3.0)
EOS ABS: 0.1 10*3/uL (ref 0.0–0.7)
Eosinophils Relative: 2.4 % (ref 0.0–5.0)
HEMATOCRIT: 38 % (ref 36.0–46.0)
Hemoglobin: 12.9 g/dL (ref 12.0–15.0)
LYMPHS ABS: 1.5 10*3/uL (ref 0.7–4.0)
Lymphocytes Relative: 33.3 % (ref 12.0–46.0)
MCHC: 34.1 g/dL (ref 30.0–36.0)
MCV: 90.5 fl (ref 78.0–100.0)
Monocytes Absolute: 0.5 10*3/uL (ref 0.1–1.0)
Monocytes Relative: 10.2 % (ref 3.0–12.0)
NEUTROS ABS: 2.4 10*3/uL (ref 1.4–7.7)
NEUTROS PCT: 53.2 % (ref 43.0–77.0)
PLATELETS: 206 10*3/uL (ref 150.0–400.0)
RBC: 4.2 Mil/uL (ref 3.87–5.11)
RDW: 12.9 % (ref 11.5–15.5)
WBC: 4.5 10*3/uL (ref 4.0–10.5)

## 2014-09-01 LAB — HEPATIC FUNCTION PANEL
ALBUMIN: 4 g/dL (ref 3.5–5.2)
ALK PHOS: 53 U/L (ref 39–117)
ALT: 23 U/L (ref 0–35)
AST: 23 U/L (ref 0–37)
BILIRUBIN DIRECT: 0.1 mg/dL (ref 0.0–0.3)
Total Bilirubin: 0.5 mg/dL (ref 0.2–1.2)
Total Protein: 6.5 g/dL (ref 6.0–8.3)

## 2014-09-01 LAB — LIPID PANEL
CHOLESTEROL: 155 mg/dL (ref 0–200)
HDL: 30.6 mg/dL — ABNORMAL LOW (ref >39.00)
LDL Cholesterol: 93 mg/dL (ref 0–99)
NonHDL: 124.65
Total CHOL/HDL Ratio: 5
Triglycerides: 157 mg/dL — ABNORMAL HIGH (ref 0.0–149.0)
VLDL: 31.4 mg/dL (ref 0.0–40.0)

## 2014-09-01 LAB — TSH: TSH: 1.64 u[IU]/mL (ref 0.35–4.50)

## 2014-09-03 ENCOUNTER — Telehealth: Payer: Self-pay | Admitting: Pulmonary Disease

## 2014-09-03 NOTE — Telephone Encounter (Signed)
LMTCB x 1 

## 2014-09-03 NOTE — Telephone Encounter (Signed)
HST 09/01/14 >> AHI 17.8, SaO2 low 85%.   Will have my nurse inform pt that sleep study shows moderate sleep apnea.  She needs ROV to discuss tx options >> can be with me or Tammy Parrett.

## 2014-09-06 DIAGNOSIS — G4733 Obstructive sleep apnea (adult) (pediatric): Secondary | ICD-10-CM | POA: Diagnosis not present

## 2014-09-06 NOTE — Telephone Encounter (Signed)
Return call for results.Chelsea Moreno

## 2014-09-06 NOTE — Telephone Encounter (Signed)
Spoke with pt.  Discussed below results and recs per Dr. Halford Chessman.  Pt verbalized understanding.  OV scheduled with TP on 09/17/14 at 2:30 pm to discuss further.  Pt confirmed appt and voiced no further questions or concerns at this time.

## 2014-09-07 ENCOUNTER — Encounter: Payer: Self-pay | Admitting: Pulmonary Disease

## 2014-09-07 ENCOUNTER — Other Ambulatory Visit: Payer: Self-pay | Admitting: *Deleted

## 2014-09-07 DIAGNOSIS — G4733 Obstructive sleep apnea (adult) (pediatric): Secondary | ICD-10-CM

## 2014-09-08 ENCOUNTER — Encounter: Payer: Self-pay | Admitting: Internal Medicine

## 2014-09-08 ENCOUNTER — Ambulatory Visit (INDEPENDENT_AMBULATORY_CARE_PROVIDER_SITE_OTHER): Admitting: Internal Medicine

## 2014-09-08 VITALS — BP 126/74 | HR 72 | Temp 98.4°F | Resp 20 | Ht 62.0 in | Wt 208.0 lb

## 2014-09-08 DIAGNOSIS — C50312 Malignant neoplasm of lower-inner quadrant of left female breast: Secondary | ICD-10-CM

## 2014-09-08 DIAGNOSIS — M810 Age-related osteoporosis without current pathological fracture: Secondary | ICD-10-CM | POA: Diagnosis not present

## 2014-09-08 DIAGNOSIS — Z0000 Encounter for general adult medical examination without abnormal findings: Secondary | ICD-10-CM | POA: Diagnosis not present

## 2014-09-08 DIAGNOSIS — E785 Hyperlipidemia, unspecified: Secondary | ICD-10-CM | POA: Diagnosis not present

## 2014-09-08 DIAGNOSIS — G4733 Obstructive sleep apnea (adult) (pediatric): Secondary | ICD-10-CM | POA: Insufficient documentation

## 2014-09-08 DIAGNOSIS — E232 Diabetes insipidus: Secondary | ICD-10-CM

## 2014-09-08 DIAGNOSIS — I872 Venous insufficiency (chronic) (peripheral): Secondary | ICD-10-CM

## 2014-09-08 NOTE — Patient Instructions (Signed)
Limit your sodium (Salt) intake    It is important that you exercise regularly, at least 20 minutes 3 to 4 times per week.  If you develop chest pain or shortness of breath seek  medical attention.  You need to lose weight.  Consider a lower calorie diet and regular exercise.  Please check your blood pressure on a regular basis.  If it is consistently greater than 150/90, please make an office appointment.  Return in 6 months for follow-up  Health Maintenance Adopting a healthy lifestyle and getting preventive care can go a long way to promote health and wellness. Talk with your health care provider about what schedule of regular examinations is right for you. This is a good chance for you to check in with your provider about disease prevention and staying healthy. In between checkups, there are plenty of things you can do on your own. Experts have done a lot of research about which lifestyle changes and preventive measures are most likely to keep you healthy. Ask your health care provider for more information. WEIGHT AND DIET  Eat a healthy diet  Be sure to include plenty of vegetables, fruits, low-fat dairy products, and lean protein.  Do not eat a lot of foods high in solid fats, added sugars, or salt.  Get regular exercise. This is one of the most important things you can do for your health.  Most adults should exercise for at least 150 minutes each week. The exercise should increase your heart rate and make you sweat (moderate-intensity exercise).  Most adults should also do strengthening exercises at least twice a week. This is in addition to the moderate-intensity exercise.  Maintain a healthy weight  Body mass index (BMI) is a measurement that can be used to identify possible weight problems. It estimates body fat based on height and weight. Your health care provider can help determine your BMI and help you achieve or maintain a healthy weight.  For females 20 years of age and  older:   A BMI below 18.5 is considered underweight.  A BMI of 18.5 to 24.9 is normal.  A BMI of 25 to 29.9 is considered overweight.  A BMI of 30 and above is considered obese.  Watch levels of cholesterol and blood lipids  You should start having your blood tested for lipids and cholesterol at 64 years of age, then have this test every 5 years.  You may need to have your cholesterol levels checked more often if:  Your lipid or cholesterol levels are high.  You are older than 64 years of age.  You are at high risk for heart disease.  CANCER SCREENING   Lung Cancer  Lung cancer screening is recommended for adults 55-80 years old who are at high risk for lung cancer because of a history of smoking.  A yearly low-dose CT scan of the lungs is recommended for people who:  Currently smoke.  Have quit within the past 15 years.  Have at least a 30-pack-year history of smoking. A pack year is smoking an average of one pack of cigarettes a day for 1 year.  Yearly screening should continue until it has been 15 years since you quit.  Yearly screening should stop if you develop a health problem that would prevent you from having lung cancer treatment.  Breast Cancer  Practice breast self-awareness. This means understanding how your breasts normally appear and feel.  It also means doing regular breast self-exams. Let your health care   provider know about any changes, no matter how small.  If you are in your 20s or 30s, you should have a clinical breast exam (CBE) by a health care provider every 1-3 years as part of a regular health exam.  If you are 55 or older, have a CBE every year. Also consider having a breast X-ray (mammogram) every year.  If you have a family history of breast cancer, talk to your health care provider about genetic screening.  If you are at high risk for breast cancer, talk to your health care provider about having an MRI and a mammogram every  year.  Breast cancer gene (BRCA) assessment is recommended for women who have family members with BRCA-related cancers. BRCA-related cancers include:  Breast.  Ovarian.  Tubal.  Peritoneal cancers.  Results of the assessment will determine the need for genetic counseling and BRCA1 and BRCA2 testing. Cervical Cancer Routine pelvic examinations to screen for cervical cancer are no longer recommended for nonpregnant women who are considered low risk for cancer of the pelvic organs (ovaries, uterus, and vagina) and who do not have symptoms. A pelvic examination may be necessary if you have symptoms including those associated with pelvic infections. Ask your health care provider if a screening pelvic exam is right for you.   The Pap test is the screening test for cervical cancer for women who are considered at risk.  If you had a hysterectomy for a problem that was not cancer or a condition that could lead to cancer, then you no longer need Pap tests.  If you are older than 65 years, and you have had normal Pap tests for the past 10 years, you no longer need to have Pap tests.  If you have had past treatment for cervical cancer or a condition that could lead to cancer, you need Pap tests and screening for cancer for at least 20 years after your treatment.  If you no longer get a Pap test, assess your risk factors if they change (such as having a new sexual partner). This can affect whether you should start being screened again.  Some women have medical problems that increase their chance of getting cervical cancer. If this is the case for you, your health care provider may recommend more frequent screening and Pap tests.  The human papillomavirus (HPV) test is another test that may be used for cervical cancer screening. The HPV test looks for the virus that can cause cell changes in the cervix. The cells collected during the Pap test can be tested for HPV.  The HPV test can be used to screen  women 51 years of age and older. Getting tested for HPV can extend the interval between normal Pap tests from three to five years.  An HPV test also should be used to screen women of any age who have unclear Pap test results.  After 64 years of age, women should have HPV testing as often as Pap tests.  Colorectal Cancer  This type of cancer can be detected and often prevented.  Routine colorectal cancer screening usually begins at 64 years of age and continues through 64 years of age.  Your health care provider may recommend screening at an earlier age if you have risk factors for colon cancer.  Your health care provider may also recommend using home test kits to check for hidden blood in the stool.  A small camera at the end of a tube can be used to examine your colon  directly (sigmoidoscopy or colonoscopy). This is done to check for the earliest forms of colorectal cancer.  Routine screening usually begins at age 71.  Direct examination of the colon should be repeated every 5-10 years through 64 years of age. However, you may need to be screened more often if early forms of precancerous polyps or small growths are found. Skin Cancer  Check your skin from head to toe regularly.  Tell your health care provider about any new moles or changes in moles, especially if there is a change in a mole's shape or color.  Also tell your health care provider if you have a mole that is larger than the size of a pencil eraser.  Always use sunscreen. Apply sunscreen liberally and repeatedly throughout the day.  Protect yourself by wearing long sleeves, pants, a wide-brimmed hat, and sunglasses whenever you are outside. HEART DISEASE, DIABETES, AND HIGH BLOOD PRESSURE   Have your blood pressure checked at least every 1-2 years. High blood pressure causes heart disease and increases the risk of stroke.  If you are between 83 years and 67 years old, ask your health care provider if you should take  aspirin to prevent strokes.  Have regular diabetes screenings. This involves taking a blood sample to check your fasting blood sugar level.  If you are at a normal weight and have a low risk for diabetes, have this test once every three years after 64 years of age.  If you are overweight and have a high risk for diabetes, consider being tested at a younger age or more often. PREVENTING INFECTION  Hepatitis B  If you have a higher risk for hepatitis B, you should be screened for this virus. You are considered at high risk for hepatitis B if:  You were born in a country where hepatitis B is common. Ask your health care provider which countries are considered high risk.  Your parents were born in a high-risk country, and you have not been immunized against hepatitis B (hepatitis B vaccine).  You have HIV or AIDS.  You use needles to inject street drugs.  You live with someone who has hepatitis B.  You have had sex with someone who has hepatitis B.  You get hemodialysis treatment.  You take certain medicines for conditions, including cancer, organ transplantation, and autoimmune conditions. Hepatitis C  Blood testing is recommended for:  Everyone born from [birth year] through 1965.  Anyone with known risk factors for hepatitis C. Sexually transmitted infections (STIs)  You should be screened for sexually transmitted infections (STIs) including gonorrhea and chlamydia if:  You are sexually active and are younger than 64 years of age.  You are older than 64 years of age and your health care provider tells you that you are at risk for this type of infection.  Your sexual activity has changed since you were last screened and you are at an increased risk for chlamydia or gonorrhea. Ask your health care provider if you are at risk.  If you do not have HIV, but are at risk, it may be recommended that you take a prescription medicine daily to prevent HIV infection. This is called  pre-exposure prophylaxis (PrEP). You are considered at risk if:  You are sexually active and do not regularly use condoms or know the HIV status of your partner(s).  You take drugs by injection.  You are sexually active with a partner who has HIV. Talk with your health care provider about whether you are  at high risk of being infected with HIV. If you choose to begin PrEP, you should first be tested for HIV. You should then be tested every 3 months for as long as you are taking PrEP.  PREGNANCY   If you are premenopausal and you may become pregnant, ask your health care provider about preconception counseling.  If you may become pregnant, take 400 to 800 micrograms (mcg) of folic acid every day.  If you want to prevent pregnancy, talk to your health care provider about birth control (contraception). OSTEOPOROSIS AND MENOPAUSE   Osteoporosis is a disease in which the bones lose minerals and strength with aging. This can result in serious bone fractures. Your risk for osteoporosis can be identified using a bone density scan.  If you are 38 years of age or older, or if you are at risk for osteoporosis and fractures, ask your health care provider if you should be screened.  Ask your health care provider whether you should take a calcium or vitamin D supplement to lower your risk for osteoporosis.  Menopause may have certain physical symptoms and risks.  Hormone replacement therapy may reduce some of these symptoms and risks. Talk to your health care provider about whether hormone replacement therapy is right for you.  HOME CARE INSTRUCTIONS   Schedule regular health, dental, and eye exams.  Stay current with your immunizations.   Do not use any tobacco products including cigarettes, chewing tobacco, or electronic cigarettes.  If you are pregnant, do not drink alcohol.  If you are breastfeeding, limit how much and how often you drink alcohol.  Limit alcohol intake to no more than 1  drink per day for nonpregnant women. One drink equals 12 ounces of beer, 5 ounces of wine, or 1 ounces of hard liquor.  Do not use street drugs.  Do not share needles.  Ask your health care provider for help if you need support or information about quitting drugs.  Tell your health care provider if you often feel depressed.  Tell your health care provider if you have ever been abused or do not feel safe at home. Document Released: 07/10/2010 Document Revised: 05/11/2013 Document Reviewed: 11/26/2012 [facility] Patient Information 2015 [place], [place]. This information is not intended to replace advice given to you by your health care provider. Make sure you discuss any questions you have with your health care provider.

## 2014-09-08 NOTE — Progress Notes (Signed)
Pre visit review using our clinic review tool, if applicable. No additional management support is needed unless otherwise documented below in the visit note. 

## 2014-09-08 NOTE — Progress Notes (Signed)
Subjective:    Patient ID: Chelsea Moreno, female    DOB: Apr 22, 1950, 64 y.o.   MRN: [id number]  HPI 64 year-old patient who has treated hypertension, dyslipidemia, and diabetes insipidus.  She has a  diagnosis of left breast cancer and presently is on tamoxifen.  She also has a history of osteoporosis and has been on biphosphonate treatment in the past.  Her most recent bone density revealed a 6% improvement in bone density with a T score of minus 2.0 She has a history of diabetes insipidus controlled with desmopressin She has a recent diagnosis of moderate OSA  She is scheduled for OB/GYN visit with bone density in the near future  Past Medical History  Diagnosis Date  . COLONIC POLYPS, HX OF 09/24/2008  . HYPERLIPIDEMIA 09/06/2006    takes Atorvasatin nightly  . OSTEOPOROSIS 09/24/2008    takes Actonel every 30days  . HYPERTENSION 09/06/2006    takes Lisinopril daily  . History of bronchitis     yrs ago  . History of migraine     last time many yrs ago  . Dizziness     when nauseated gets dizzy  . Bursitis   . Vitamin D deficiency     takes OTC Vit D  . DEPRESSION 09/23/2007    takes Lexapro daily but hasn't taken since Sept 2014  . Muscle spasms of head and/or neck   . Hemorrhoids   . Diarrhea   . Diabetes insipidus     takes Desmopressin bid  . Breast cancer 11/05/12    left  . Hx of radiation therapy 01/29/13- 03/16/13    left breast 4500 cGy 25 sessions, left breast boost 1000 cGy 5 sessions    Social History   Social History  . Marital Status: Widowed    Spouse Name: N/A  . Number of Children: N/A  . Years of Education: N/A   Occupational History  . 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 Smoking status: Former Smoker -- 0.50 packs/day for 4 years    Types: Cigarettes    Quit date: 01/07/1973  . Smokeless tobacco: Never Used     Comment: quit 3yrs ago  . Alcohol Use: 0.0 oz/week    0 Standard drinks or equivalent per week     Comment:  socailly  . Drug Use: No  . Sexual Activity: Not Currently    Birth Control/ Protection: Post-menopausal     Comment: menarche age 46, first live birth age 64, menopause 2002, no HRT   Other Topics Concern  . Not on file   Social History Narrative    Past Surgical History  Procedure Laterality Date  . Breast biopsy Left 71yrs ago  . Colonoscopy    . Breast lumpectomy with needle localization Left 12/16/2012    Procedure: PARTIAL MASTECTOMY WITH NEEDLE LOCALIZATION;  Surgeon: Stark Klein, MD;  Location: [facility];  Service: General;  Laterality: Left;  1:00 NL at [facility]     Family History  Problem Relation Age of Onset  . Ovarian cancer Mother   . Cancer Mother     Ovarian  . Heart disease Mother     Before age 81  . Heart attack Mother   . Varicose Veins Mother   . Heart disease Father     Before age 4  . Hyperlipidemia Father   . Hypertension Father   . Heart attack Father   . Heart disease Sister  Before age 8  . Hypertension Sister   . Heart attack Sister   . Heart disease Brother     Before age 64  . Hypertension Brother   . Heart attack Brother     No Known Allergies  Current Outpatient Prescriptions on File Prior to Visit  Medication Sig Dispense Refill  . atorvastatin (LIPITOR) 80 MG tablet TAKE ONE TABLET BY MOUTH ONE TIME DAILY 90 tablet 1  . desmopressin (DDAVP NASAL) 0.01 % solution USE ONE SPRAY IN EACH NOSTRIL TWICE DAILY 5 mL 2  . escitalopram (LEXAPRO) 10 MG tablet Take 1 tablet (10 mg total) by mouth daily. 90 tablet 4  . latanoprost (XALATAN) 0.005 % ophthalmic solution Place 1 drop into both eyes at bedtime.    Marland Kitchen lisinopril (PRINIVIL,ZESTRIL) 20 MG tablet TAKE ONE TABLET BY MOUTH ONE TIME DAILY 90 tablet 0  . ondansetron (ZOFRAN) 4 MG tablet Take 4 mg by mouth every 8 (eight) hours as needed for nausea or vomiting.    . potassium chloride SA (K-DUR,KLOR-CON) 20 MEQ tablet Take 1 tablet (20 mEq total) by mouth 2 (two) times daily. 90 tablet 6  .  tamoxifen (NOLVADEX) 20 MG tablet Take 1 tablet (20 mg total) by mouth daily. 90 tablet 1  . timolol (BETIMOL) 0.5 % ophthalmic solution Place 1 drop into both eyes daily.     No current facility-administered medications on file prior to visit.    There were no vitals taken for this visit.       Review of Systems  Constitutional: Negative.   HENT: Negative for congestion, dental problem, hearing loss, rhinorrhea, sinus pressure, sore throat and tinnitus.   Eyes: Negative for pain, discharge and visual disturbance.  Respiratory: Negative for cough and shortness of breath.   Cardiovascular: Negative for chest pain, palpitations and leg swelling.  Gastrointestinal: Positive for nausea. Negative for vomiting, abdominal pain, diarrhea, constipation, blood in stool and abdominal distention.  Genitourinary: Negative for dysuria, urgency, frequency, hematuria, flank pain, vaginal bleeding, vaginal discharge, difficulty urinating, vaginal pain and pelvic pain.  Musculoskeletal: Negative for joint swelling, arthralgias and gait problem.  Skin: Negative for rash.  Neurological: Positive for headaches. Negative for dizziness, syncope, speech difficulty, weakness and numbness.       Describes intermittent hyperesthesia involving the left lateral lower leg and ankle area  Hematological: Negative for adenopathy.  Psychiatric/Behavioral: Negative for behavioral problems, dysphoric mood and agitation. The patient is not nervous/anxious.        Objective:   Physical Exam  Constitutional: She is oriented to person, place, and time. She appears well-developed and well-nourished.  HENT:  Head: Normocephalic.  Right Ear: External ear normal.  Left Ear: External ear normal.  Mouth/Throat: Oropharynx is clear and moist.  Eyes: Conjunctivae and EOM are normal. Pupils are equal, round, and reactive to light.  Neck: Normal range of motion. Neck supple. No thyromegaly present.  Cardiovascular: Normal  rate, regular rhythm, normal heart sounds and intact distal pulses.   Pulmonary/Chest: Effort normal and breath sounds normal.  Abdominal: Soft. Bowel sounds are normal. She exhibits no mass. There is no tenderness.  Musculoskeletal: Normal range of motion.  Lymphadenopathy:    She has no cervical adenopathy.  Neurological: She is alert and oriented to person, place, and time.  Skin: Skin is warm and dry. No rash noted.  Psychiatric: She has a normal mood and affect. Her behavior is normal.          Assessment & Plan:  Preventive health examination Hypertension stable Diabetes insipidus.  Stable History left breast cancer.  Followup oncology Dyslipidemia.  Continue atorvastatin Osteoporosis.  Will check a DEXA scan later this year and consider resuming biphosphonate therapy  Recheck 6 months

## 2014-09-17 ENCOUNTER — Ambulatory Visit (INDEPENDENT_AMBULATORY_CARE_PROVIDER_SITE_OTHER): Admitting: Adult Health

## 2014-09-17 ENCOUNTER — Encounter: Payer: Self-pay | Admitting: Adult Health

## 2014-09-17 VITALS — BP 132/78 | HR 65 | Temp 98.3°F | Ht 62.0 in | Wt 210.0 lb

## 2014-09-17 DIAGNOSIS — G4733 Obstructive sleep apnea (adult) (pediatric): Secondary | ICD-10-CM

## 2014-09-17 NOTE — Assessment & Plan Note (Signed)
Moderate OSA ,  Discussed OSA and tx options  She would like to proceed with CPAP , try nasal pillows as he sleeps on side/stomach.   Plan  Begin CPAP At bedtime   Wt loss  Do not drive if sleepy.  Download in 1 mon  Ov in 2 mon

## 2014-09-17 NOTE — Progress Notes (Signed)
   Subjective:    Patient ID: Chelsea Moreno, female    DOB: Nov 10, 1950, 64 y.o.   MRN: [id number]  HPI 64 yo female seen for sleep consult 08/30/14 .   TEST  HST 09/01/14 >> AHI 17.8, SaO2 low 85%.  09/17/2014 Follow up : OSA /sleep study results.  Pt returns for a follow up to discuss HST results.  Pt underwent home sleep study on last month.  HST 09/01/14 >> AHI 17.8, SaO2 low 85%. Discussed that she has moderate sleep apnea.  Discussed dx of OSA and potential complications  Went over sleep apnea tx options with wt loss, oral appliance and CPAP  She opts to go forward with CPAP .  Denies chest pain, orthopnea or edema.      Review of Systems Constitutional:   No  weight loss, night sweats,  Fevers, chills,+ fatigue, or  lassitude.  HEENT:   No headaches,  Difficulty swallowing,  Tooth/dental problems, or  Sore throat,                No sneezing, itching, ear ache, nasal congestion, post nasal drip,   CV:  No chest pain,  Orthopnea, PND, swelling in lower extremities, anasarca, dizziness, palpitations, syncope.   GI  No heartburn, indigestion, abdominal pain, nausea, vomiting, diarrhea, change in bowel habits, loss of appetite, bloody stools.   Resp: No shortness of breath with exertion or at rest.  No excess mucus, no productive cough,  No non-productive cough,  No coughing up of blood.  No change in color of mucus.  No wheezing.  No chest wall deformity  Skin: no rash or lesions.  GU: no dysuria, change in color of urine, no urgency or frequency.  No flank pain, no hematuria   MS:  No joint pain or swelling.  No decreased range of motion.  No back pain.  Psych:  No change in mood or affect. No depression or anxiety.  No memory loss.         Objective:   Physical Exam GEN: A/Ox3; pleasant , NAD, obese   HEENT:  [place]/AT,  EACs-clear, TMs-wnl, NOSE-clear, THROAT-clear, no lesions, no postnasal drip or exudate noted. Class 2-3 airway   NECK:  Supple w/ fair ROM; no JVD;  normal carotid impulses w/o bruits; no thyromegaly or nodules palpated; no lymphadenopathy.  RESP  Clear  P & A; w/o, wheezes/ rales/ or rhonchi.no accessory muscle use, no dullness to percussion  CARD:  RRR, no m/r/g  , no peripheral edema, pulses intact, no cyanosis or clubbing.  GI:   Soft & nt; nml bowel sounds; no organomegaly or masses detected.  Musco: Warm bil, no deformities or joint swelling noted.   Neuro: alert, no focal deficits noted.    Skin: Warm, no lesions or rashes         Assessment & Plan:

## 2014-09-17 NOTE — Patient Instructions (Signed)
Begin CPAP At bedtime .  Download in  7month.  Work on weight loss.  Follow up Dr. Halford Chessman  In 2 months and As needed

## 2014-09-24 ENCOUNTER — Other Ambulatory Visit: Payer: Self-pay | Admitting: Internal Medicine

## 2014-09-24 ENCOUNTER — Other Ambulatory Visit: Payer: Self-pay | Admitting: Hematology and Oncology

## 2014-10-27 ENCOUNTER — Telehealth: Payer: Self-pay | Admitting: *Deleted

## 2014-10-27 LAB — HM DEXA SCAN

## 2014-10-27 LAB — HM MAMMOGRAPHY

## 2014-10-27 NOTE — Telephone Encounter (Signed)
Received call from [facility] with [facility], pt there for Mammo and need verbal order for Bilateral Diagnostic Mammo and Ultrasound if needed. Told her Ivin Booty that is fine go ahead and send order over for Dr.K to sign. Ivin Booty verbalized understanding will send order.

## 2014-11-04 ENCOUNTER — Encounter: Payer: Self-pay | Admitting: Internal Medicine

## 2014-11-15 ENCOUNTER — Telehealth: Payer: Self-pay | Admitting: Hematology and Oncology

## 2014-11-15 ENCOUNTER — Ambulatory Visit (HOSPITAL_BASED_OUTPATIENT_CLINIC_OR_DEPARTMENT_OTHER): Admitting: Hematology and Oncology

## 2014-11-15 VITALS — BP 144/65 | HR 61 | Temp 98.3°F | Resp 18 | Ht 62.0 in | Wt 204.9 lb

## 2014-11-15 DIAGNOSIS — D0512 Intraductal carcinoma in situ of left breast: Secondary | ICD-10-CM

## 2014-11-15 DIAGNOSIS — Z170 Estrogen receptor positive status [ER+]: Secondary | ICD-10-CM

## 2014-11-15 DIAGNOSIS — Z79811 Long term (current) use of aromatase inhibitors: Secondary | ICD-10-CM | POA: Diagnosis not present

## 2014-11-15 DIAGNOSIS — C50312 Malignant neoplasm of lower-inner quadrant of left female breast: Secondary | ICD-10-CM

## 2014-11-15 NOTE — Telephone Encounter (Signed)
Appointments made and avs printed for patient °

## 2014-11-15 NOTE — Assessment & Plan Note (Signed)
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   Tamoxifen toxicities: Patient is now tolerating tamoxifen extremely well without any major problems or concerns.  Breast Cancer Surveillance: 1. Breast exam 11/15/2014: Normal 2. Mammogram 10/27/2014 No abnormalities. Postsurgical changes. Breast Density Category A. I recommended that she get 3-D mammograms for surveillance. Discussed the differences between different breast density categories.  Survivorship:patient admitted to not doing any exercise and gaining weight. She will plan to go back to [facility] and doing exercise again.

## 2014-11-15 NOTE — Progress Notes (Signed)
Patient Care Team: Marletta Lor, MD as PCP - General  DIAGNOSIS: Cancer of lower-inner quadrant of left female breast [facility])   Staging form: Breast, AJCC 7th Edition     Clinical: Stage 0 (Tis, N0, cM0) - Unsigned       Staging comments: Staged at breast conference 11.5.14      Pathologic: No stage assigned - Unsigned   SUMMARY OF ONCOLOGIC HISTORY:   Cancer of lower-inner quadrant of left female breast ([place])   11/05/2012 Initial Diagnosis DCIS with calcifications and necrosis ER 100% PR 100%   12/26/2012 Surgery Left partial mastectomy: DCIS: Reexcision margins benign   01/30/2013 - 03/16/2013 Radiation Therapy Radiation therapy adjuvant   03/17/2013 -  Anti-estrogen oral therapy Adjuvant tamoxifen 20 mg daily plan is for 5 years    CHIEF COMPLIANT: follow-up on tamoxifen  INTERVAL HISTORY: Chelsea Moreno is a 64 year old with above-mentioned history of left breast DCIS treated with lumpectomy radiation and is currently in tamoxifen. She has been tolerating tamoxifen extremely well without any major problems or concerns. Denies any hot flashes or myalgias or vaginal bleeding. She had a recent mammogram which was normal.  REVIEW OF SYSTEMS:   Constitutional: Denies fevers, chills or abnormal weight loss Eyes: Denies blurriness of vision Ears, nose, mouth, throat, and face: Denies mucositis or sore throat Respiratory: Denies cough, dyspnea or wheezes Cardiovascular: Denies palpitation, chest discomfort or lower extremity swelling Gastrointestinal:  Denies nausea, heartburn or change in bowel habits Skin: Denies abnormal skin rashes Lymphatics: Denies new lymphadenopathy or easy bruising Neurological:Denies numbness, tingling or new weaknesses Behavioral/Psych: Mood is stable, no new changes  Breast:  denies any pain or lumps or nodules in either breasts All other systems were reviewed with the patient and are negative.  I have reviewed the past medical history, past  surgical history, social history and family history with the patient and they are unchanged from previous note.  ALLERGIES:  has No Known Allergies.  MEDICATIONS:  Current Outpatient Prescriptions  Medication Sig Dispense Refill  . atorvastatin (LIPITOR) 80 MG tablet TAKE ONE TABLET BY MOUTH ONE TIME DAILY 90 tablet 1  . desmopressin (DDAVP) 0.01 % SOLN USE ONE SPRAY IN EACH NOSTRIL TWICE DAILY 5 Bottle 2  . escitalopram (LEXAPRO) 10 MG tablet Take 1 tablet (10 mg total) by mouth daily. 90 tablet 4  . latanoprost (XALATAN) 0.005 % ophthalmic solution Place 1 drop into both eyes at bedtime.    Marland Kitchen lisinopril (PRINIVIL,ZESTRIL) 20 MG tablet TAKE ONE TABLET BY MOUTH ONE TIME DAILY 90 tablet 0  . ondansetron (ZOFRAN) 4 MG tablet Take 4 mg by mouth every 8 (eight) hours as needed for nausea or vomiting.    . potassium chloride SA (K-DUR,KLOR-CON) 20 MEQ tablet Take 1 tablet (20 mEq total) by mouth 2 (two) times daily. 90 tablet 6  . tamoxifen (NOLVADEX) 20 MG tablet TAKE ONE TABLET BY MOUTH ONE TIME DAILY 90 tablet 1  . timolol (BETIMOL) 0.5 % ophthalmic solution Place 1 drop into both eyes daily.     No current facility-administered medications for this visit.    PHYSICAL EXAMINATION: ECOG PERFORMANCE STATUS: 0 - Asymptomatic  Filed Vitals:   11/15/14 1349  BP: 144/65  Pulse: 61  Temp: 98.3 F (36.8 C)  Resp: 18   Filed Weights   11/15/14 1349  Weight: 204 lb 14.4 oz (92.942 kg)    GENERAL:alert, no distress and comfortable SKIN: skin color, texture, turgor are normal, no rashes or significant  lesions EYES: normal, Conjunctiva are pink and non-injected, sclera clear OROPHARYNX:no exudate, no erythema and lips, buccal mucosa, and tongue normal  NECK: supple, thyroid normal size, non-tender, without nodularity LYMPH:  no palpable lymphadenopathy in the cervical, axillary or inguinal LUNGS: clear to auscultation and percussion with normal breathing effort HEART: regular rate &  rhythm and no murmurs and no lower extremity edema ABDOMEN:abdomen soft, non-tender and normal bowel sounds Musculoskeletal:no cyanosis of digits and no clubbing  NEURO: alert & oriented x 3 with fluent speech, no focal motor/sensory deficits BREAST: No palpable masses or nodules in either right or left breasts. No palpable axillary supraclavicular or infraclavicular adenopathy no breast tenderness or nipple discharge. (exam performed in the presence of a chaperone)  LABORATORY DATA:  I have reviewed the data as listed   Chemistry      Component Value Date/Time   NA 142 09/01/2014 0903   NA 140 05/20/2014 1413   K 4.2 09/01/2014 0903   K 4.2 05/20/2014 1413   CL 103 09/01/2014 0903   CO2 30 09/01/2014 0903   CO2 24 05/20/2014 1413   BUN 7 09/01/2014 0903   BUN 9.0 05/20/2014 1413   CREATININE 0.60 09/01/2014 0903   CREATININE 0.7 05/20/2014 1413      Component Value Date/Time   CALCIUM 9.1 09/01/2014 0903   CALCIUM 9.1 05/20/2014 1413   ALKPHOS 53 09/01/2014 0903   ALKPHOS 66 05/20/2014 1413   AST 23 09/01/2014 0903   AST 37* 05/20/2014 1413   ALT 23 09/01/2014 0903   ALT 34 05/20/2014 1413   BILITOT 0.5 09/01/2014 0903   BILITOT 0.74 05/20/2014 1413       Lab Results  Component Value Date   WBC 4.5 09/01/2014   HGB 12.9 09/01/2014   HCT 38.0 09/01/2014   MCV 90.5 09/01/2014   PLT 206.0 09/01/2014   NEUTROABS 2.4 09/01/2014   ASSESSMENT & PLAN:  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   Tamoxifen toxicities: Patient is now tolerating tamoxifen extremely well without any major problems or concerns.  Breast Cancer Surveillance: 1. Breast exam 11/15/2014: Normal 2. Mammogram 10/27/2014 No abnormalities. Postsurgical changes. Breast Density Category A. I recommended that she get 3-D mammograms for surveillance. Discussed the differences  between different breast density categories.  Survivorship:patient admitted to not doing any exercise and gaining weight. She will plan to go back to [facility] and doing exercise again.   Patient currently wears a CPap mask for obstructive sleep apnea. I discussed with her that if she loses weight she might not need for a very long time. She finds that encouraging and will think about doing exercises.  No orders of the defined types were placed in this encounter.   The patient has a good understanding of the overall plan. she agrees with it. she will call with any problems that may develop before the next visit here.   Rulon Eisenmenger, MD 11/15/2014

## 2014-11-22 ENCOUNTER — Encounter: Payer: Self-pay | Admitting: Pulmonary Disease

## 2014-11-22 ENCOUNTER — Ambulatory Visit (INDEPENDENT_AMBULATORY_CARE_PROVIDER_SITE_OTHER): Admitting: Pulmonary Disease

## 2014-11-22 VITALS — BP 140/86 | HR 66 | Ht 62.0 in | Wt 211.0 lb

## 2014-11-22 DIAGNOSIS — G4733 Obstructive sleep apnea (adult) (pediatric): Secondary | ICD-10-CM

## 2014-11-22 DIAGNOSIS — Z9989 Dependence on other enabling machines and devices: Principal | ICD-10-CM

## 2014-11-22 NOTE — Progress Notes (Signed)
Chief Complaint  Patient presents with  . Follow-up    Wears CPAP nightly. Uses nasal pillows - pt reports it being very uncomfortable, makes nose sore and shifts alot while sleeping.     History of Present Illness: Chelsea Moreno is a 64 y.o. female with OSA.  She is surprised at how well she has been doing with CPAP.  She is sleeping better.  She no longer needs to take naps.  She has noticed less frequent headaches.  Her main issue is related to her mask. She has nasal pillows.  These sometimes shift around when she turns on her stomach.  This is more of an occasional nuisance.   TESTS: HST 09/01/14 >> AHI 17.8, SaO2 low 85% Auto CPAP 10/20/14 to 11/18/14 >> used on 30 of 30 nights with average 7 hrs and 0 min.  Average AHI is 0.9 with median CPAP 8 cm H2O and 95 th percentile CPAP 14 cm H20.   PMhx >> HTN, HLD, Migraines, Depression, DI, Breast cancer 2014 s/p XRT  Past surgical hx, Allergies, Family hx, Social hx all reviewed.   Physical Exam: BP 140/86 mmHg  Pulse 66  Ht 5\' 2"  (1.575 m)  Wt 211 lb (95.709 kg)  BMI 38.58 kg/m2  SpO2 97%  General - No distress ENT - No sinus tenderness, no oral exudate, no LAN, MP 3, elongated uvula, high arched palate Cardiac - s1s2 regular, no murmur Chest - No wheeze/rales/dullness Back - No focal tenderness Abd - Soft, non-tender Ext - No edema Neuro - Normal strength Skin - No rashes Psych - normal mood, and behavior   Assessment/Plan:  Obstructive sleep apnea. She is compliant with CPAP and reports benefit. Plan: - continue auto CPAP   Medication Sig  . atorvastatin (LIPITOR) 80 MG tablet TAKE ONE TABLET BY MOUTH ONE TIME DAILY  . desmopressin (DDAVP) 0.01 % SOLN USE ONE SPRAY IN EACH NOSTRIL TWICE DAILY  . escitalopram (LEXAPRO) 10 MG tablet Take 1 tablet (10 mg total) by mouth daily.  Marland Kitchen latanoprost (XALATAN) 0.005 % ophthalmic solution Place 1 drop into both eyes at bedtime.  Marland Kitchen lisinopril (PRINIVIL,ZESTRIL) 20 MG  tablet TAKE ONE TABLET BY MOUTH ONE TIME DAILY  . ondansetron (ZOFRAN) 4 MG tablet Take 4 mg by mouth every 8 (eight) hours as needed for nausea or vomiting.  . potassium chloride SA (K-DUR,KLOR-CON) 20 MEQ tablet Take 1 tablet (20 mEq total) by mouth 2 (two) times daily.  . tamoxifen (NOLVADEX) 20 MG tablet TAKE ONE TABLET BY MOUTH ONE TIME DAILY  . timolol (BETIMOL) 0.5 % ophthalmic solution Place 1 drop into both eyes daily.     Chesley Mires, MD [facility] Pager:  [phone]

## 2014-11-22 NOTE — Patient Instructions (Signed)
Can review following company web sites for CPAP mask options: [employer], [employer], [employer], Personnel [employer]  Follow up in 1 year

## 2014-12-28 ENCOUNTER — Other Ambulatory Visit: Payer: Self-pay | Admitting: Internal Medicine

## 2015-03-09 ENCOUNTER — Other Ambulatory Visit: Payer: Self-pay | Admitting: Internal Medicine

## 2015-04-04 ENCOUNTER — Encounter: Payer: Self-pay | Admitting: Internal Medicine

## 2015-04-04 ENCOUNTER — Ambulatory Visit (INDEPENDENT_AMBULATORY_CARE_PROVIDER_SITE_OTHER): Admitting: Internal Medicine

## 2015-04-04 VITALS — BP 130/70 | HR 72 | Temp 98.2°F | Resp 20 | Ht 62.0 in | Wt 207.0 lb

## 2015-04-04 DIAGNOSIS — L439 Lichen planus, unspecified: Secondary | ICD-10-CM

## 2015-04-04 DIAGNOSIS — E232 Diabetes insipidus: Secondary | ICD-10-CM | POA: Diagnosis not present

## 2015-04-04 DIAGNOSIS — E785 Hyperlipidemia, unspecified: Secondary | ICD-10-CM

## 2015-04-04 DIAGNOSIS — I10 Essential (primary) hypertension: Secondary | ICD-10-CM | POA: Diagnosis not present

## 2015-04-04 MED ORDER — LISINOPRIL 20 MG PO TABS: 20.0000 mg | ORAL_TABLET | Freq: Every day | ORAL | Status: DC

## 2015-04-04 NOTE — Patient Instructions (Signed)

## 2015-04-04 NOTE — Progress Notes (Signed)
Subjective:    Patient ID: Chelsea Moreno, female    DOB: 10-23-1950, 65 y.o.   MRN: [id number]  HPI  65 year old patient who is seen today for her six-month follow-up.  She has a history of essential hypertension as well as diabetes insipidus.  She is followed closely oncology for breast cancer. She is doing quite well. She continues to have rare episodes of severe intractable nausea, vomiting and diarrhea.  Thankfully, these are quite infrequent and occurring only annually.  She had another episode about 1 month ago. She has been followed by pulmonary medicine for OSA.  Otherwise, doing well.  Social history.  Has a doctorate teaches history at [employer] History  Diagnosis Date  . COLONIC POLYPS, HX OF 09/24/2008  . HYPERLIPIDEMIA 09/06/2006    takes Atorvasatin nightly  . OSTEOPOROSIS 09/24/2008    takes Actonel every 30days  . HYPERTENSION 09/06/2006    takes Lisinopril daily  . History of bronchitis     yrs ago  . History of migraine     last time many yrs ago  . Dizziness     when nauseated gets dizzy  . Bursitis   . Vitamin D deficiency     takes OTC Vit D  . DEPRESSION 09/23/2007    takes Lexapro daily but hasn't taken since Sept 2014  . Muscle spasms of head and/or neck   . Hemorrhoids   . Diarrhea   . Diabetes insipidus ([place])     takes Desmopressin bid  . Breast cancer ([place]) 11/05/12    left  . Hx of radiation therapy 01/29/13- 03/16/13    left breast 4500 cGy 25 sessions, left breast boost 1000 cGy 5 sessions    Social History   Social History  . Marital Status: Widowed    Spouse Name: N/A  . Number of Children: N/A  . Years of Education: N/A   Occupational History  . 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 Smoking status: Former Smoker -- 0.50 packs/day for 4 years    Types: Cigarettes    Quit date: 01/07/1973  . Smokeless tobacco: Never Used     Comment: quit 48yrs ago  . Alcohol Use: 0.0 oz/week    0 Standard drinks or  equivalent per week     Comment: socailly  . Drug Use: No  . Sexual Activity: Not Currently    Birth Control/ Protection: Post-menopausal     Comment: menarche age 67, first live birth age 72, menopause 2002, no HRT   Other Topics Concern  . Not on file   Social History Narrative    Past Surgical History  Procedure Laterality Date  . Breast biopsy Left 80yrs ago  . Colonoscopy    . Breast lumpectomy with needle localization Left 12/16/2012    Procedure: PARTIAL MASTECTOMY WITH NEEDLE LOCALIZATION;  Surgeon: Stark Klein, MD;  Location: [place];  Service: General;  Laterality: Left;  1:00 NL at [facility]     Family History  Problem Relation Age of Onset  . Ovarian cancer Mother   . Cancer Mother     Ovarian  . Heart disease Mother     Before age 65  . Heart attack Mother   . Varicose Veins Mother   . Heart disease Father     Before age 69  . Hyperlipidemia Father   . Hypertension Father   . Heart attack Father   . Heart disease Sister  Before age 3  . Hypertension Sister   . Heart attack Sister   . Heart disease Brother     Before age 54  . Hypertension Brother   . Heart attack Brother     No Known Allergies  Current Outpatient Prescriptions on File Prior to Visit  Medication Sig Dispense Refill  . atorvastatin (LIPITOR) 80 MG tablet TAKE ONE TABLET BY MOUTH ONE TIME DAILY 90 tablet 1  . desmopressin (DDAVP) 0.01 % SOLN SPRAY 1 SPRAY IN EACH NOSTRIL TWICE DAILY 5 mL 3  . escitalopram (LEXAPRO) 10 MG tablet Take 1 tablet (10 mg total) by mouth daily. 90 tablet 4  . latanoprost (XALATAN) 0.005 % ophthalmic solution Place 1 drop into both eyes at bedtime.    . ondansetron (ZOFRAN) 4 MG tablet Take 4 mg by mouth every 8 (eight) hours as needed for nausea or vomiting.    . potassium chloride SA (K-DUR,KLOR-CON) 20 MEQ tablet Take 1 tablet (20 mEq total) by mouth 2 (two) times daily. 90 tablet 6  . tamoxifen (NOLVADEX) 20 MG tablet TAKE ONE TABLET BY MOUTH ONE TIME DAILY  90 tablet 1  . timolol (BETIMOL) 0.5 % ophthalmic solution Place 1 drop into both eyes daily.     No current facility-administered medications on file prior to visit.    BP 130/70 mmHg  Pulse 72  Temp(Src) 98.2 F (36.8 C) (Oral)  Resp 20  Ht 5\' 2"  (1.575 m)  Wt 207 lb (93.895 kg)  BMI 37.85 kg/m2  SpO2 99%     Review of Systems  Constitutional: Negative.   HENT: Negative for congestion, dental problem, hearing loss, rhinorrhea, sinus pressure, sore throat and tinnitus.   Eyes: Negative for pain, discharge and visual disturbance.  Respiratory: Negative for cough and shortness of breath.   Cardiovascular: Negative for chest pain, palpitations and leg swelling.  Gastrointestinal: Negative for nausea, vomiting, abdominal pain, diarrhea, constipation, blood in stool and abdominal distention.  Genitourinary: Negative for dysuria, urgency, frequency, hematuria, flank pain, vaginal bleeding, vaginal discharge, difficulty urinating, vaginal pain and pelvic pain.  Musculoskeletal: Negative for joint swelling, arthralgias and gait problem.  Skin: Negative for rash.  Neurological: Negative for dizziness, syncope, speech difficulty, weakness, numbness and headaches.  Hematological: Negative for adenopathy.  Psychiatric/Behavioral: Negative for behavioral problems, dysphoric mood and agitation. The patient is not nervous/anxious.        Objective:   Physical Exam  Constitutional: She is oriented to person, place, and time. She appears well-developed and well-nourished.  Overweight No distress Blood pressure normal  HENT:  Head: Normocephalic.  Right Ear: External ear normal.  Left Ear: External ear normal.  Mouth/Throat: Oropharynx is clear and moist.  Eyes: Conjunctivae and EOM are normal. Pupils are equal, round, and reactive to light.  Neck: Normal range of motion. Neck supple. No thyromegaly present.  Cardiovascular: Normal rate, regular rhythm, normal heart sounds and intact  distal pulses.   Pulmonary/Chest: Effort normal and breath sounds normal.  Abdominal: Soft. Bowel sounds are normal. She exhibits no mass. There is no tenderness.  Musculoskeletal: Normal range of motion.  Lymphadenopathy:    She has no cervical adenopathy.  Neurological: She is alert and oriented to person, place, and time.  Skin: Skin is warm and dry. No rash noted.  Psychiatric: She has a normal mood and affect. Her behavior is normal.          Assessment & Plan:  Hypertension, stable DI stable no change in therapy History left  breast cancer.  Follow-up oncology May as scheduled OSA  Recheck 6 months or as needed

## 2015-04-04 NOTE — Progress Notes (Signed)
Pre visit review using our clinic review tool, if applicable. No additional management support is needed unless otherwise documented below in the visit note. 

## 2015-04-22 ENCOUNTER — Other Ambulatory Visit: Payer: Self-pay | Admitting: Hematology and Oncology

## 2015-04-26 ENCOUNTER — Other Ambulatory Visit: Payer: Self-pay | Admitting: General Practice

## 2015-04-26 MED ORDER — ATORVASTATIN CALCIUM 80 MG PO TABS: 80.0000 mg | ORAL_TABLET | Freq: Every day | ORAL | Status: DC

## 2015-05-16 ENCOUNTER — Ambulatory Visit: Admitting: Hematology and Oncology

## 2015-05-30 ENCOUNTER — Ambulatory Visit (HOSPITAL_BASED_OUTPATIENT_CLINIC_OR_DEPARTMENT_OTHER): Admitting: Hematology and Oncology

## 2015-05-30 ENCOUNTER — Telehealth: Payer: Self-pay | Admitting: Hematology and Oncology

## 2015-05-30 ENCOUNTER — Encounter: Payer: Self-pay | Admitting: Hematology and Oncology

## 2015-05-30 VITALS — BP 142/67 | HR 97 | Temp 98.3°F | Resp 18 | Wt 214.9 lb

## 2015-05-30 DIAGNOSIS — Z79811 Long term (current) use of aromatase inhibitors: Secondary | ICD-10-CM

## 2015-05-30 DIAGNOSIS — C50312 Malignant neoplasm of lower-inner quadrant of left female breast: Secondary | ICD-10-CM

## 2015-05-30 DIAGNOSIS — D0512 Intraductal carcinoma in situ of left breast: Secondary | ICD-10-CM

## 2015-05-30 NOTE — Progress Notes (Signed)
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 She is tolerating extremely well without any major problems or concerns. Denies any hot flashes or myalgias. She is currently living all by herself and does not appear to be blocking the right kinds of foods. She is eating a lot of junk food. She has not been losing weight. She finally start to go back to the [facility].  REVIEW OF SYSTEMS:   Constitutional: Denies fevers, chills or abnormal weight loss Eyes: Denies blurriness of vision Ears, nose, mouth, throat, and face: Denies mucositis or sore throat Respiratory: Denies cough, dyspnea or wheezes Cardiovascular: Denies palpitation, chest discomfort Gastrointestinal:  Denies nausea, heartburn or change in bowel habits Skin: Denies abnormal skin rashes Lymphatics: Denies new lymphadenopathy or easy bruising Neurological:Denies numbness, tingling or new weaknesses Behavioral/Psych: Mood is stable, no new changes  Extremities: No lower extremity edema Breast:  denies any pain or lumps or nodules in either breasts All  other systems were reviewed with the patient and are negative.  I have reviewed the past medical history, past surgical history, social history and family history with the patient and they are unchanged from previous note.  ALLERGIES:  has No Known Allergies.  MEDICATIONS:  Current Outpatient Prescriptions  Medication Sig Dispense Refill  . atorvastatin (LIPITOR) 80 MG tablet Take 1 tablet (80 mg total) by mouth daily. 90 tablet 1  . desmopressin (DDAVP) 0.01 % SOLN SPRAY 1 SPRAY IN EACH NOSTRIL TWICE DAILY 5 mL 3  . escitalopram (LEXAPRO) 10 MG tablet Take 1 tablet (10 mg total) by mouth daily. 90 tablet 4  . latanoprost (XALATAN) 0.005 % ophthalmic solution Place 1 drop into both eyes at bedtime.    Marland Kitchen lisinopril (PRINIVIL,ZESTRIL) 20 MG tablet Take 1 tablet (20 mg total) by mouth daily. 90 tablet 1  . potassium chloride SA (K-DUR,KLOR-CON) 20 MEQ tablet Take 1 tablet (20 mEq total) by mouth 2 (two) times daily. 90 tablet 6  . tamoxifen (NOLVADEX) 20 MG tablet TAKE 1 TABLET BY MOUTH DAILY 90 tablet 0  . timolol (BETIMOL) 0.5 % ophthalmic solution Place 1 drop into both eyes daily.     No current facility-administered medications for this visit.    PHYSICAL EXAMINATION: ECOG PERFORMANCE STATUS: 1 - Symptomatic but completely ambulatory  Filed Vitals:   05/30/15 1510  BP: 142/67  Pulse: 97  Temp: 98.3 F (36.8 C)  Resp: 18   Filed Weights   05/30/15 1510  Weight: 214 lb 14.4 oz (97.478 kg)    GENERAL:alert, no distress and comfortable SKIN: skin color, texture, turgor  are normal, no rashes or significant lesions EYES: normal, Conjunctiva are pink and non-injected, sclera clear OROPHARYNX:no exudate, no erythema and lips, buccal mucosa, and tongue normal  NECK: supple, thyroid normal size, non-tender, without nodularity LYMPH:  no palpable lymphadenopathy in the cervical, axillary or inguinal LUNGS: clear to auscultation and percussion with normal breathing effort HEART:  regular rate & rhythm and no murmurs and no lower extremity edema ABDOMEN:abdomen soft, non-tender and normal bowel sounds MUSCULOSKELETAL:no cyanosis of digits and no clubbing  NEURO: alert & oriented x 3 with fluent speech, no focal motor/sensory deficits EXTREMITIES: No lower extremity edema BREAST: No palpable masses or nodules in either right or left breasts. No palpable axillary supraclavicular or infraclavicular adenopathy no breast tenderness or nipple discharge. (exam performed in the presence of a chaperone)  LABORATORY DATA:  I have reviewed the data as listed   Chemistry      Component Value Date/Time   NA 142 09/01/2014 0903   NA 140 05/20/2014 1413   K 4.2 09/01/2014 0903   K 4.2 05/20/2014 1413   CL 103 09/01/2014 0903   CO2 30 09/01/2014 0903   CO2 24 05/20/2014 1413   BUN 7 09/01/2014 0903   BUN 9.0 05/20/2014 1413   CREATININE 0.60 09/01/2014 0903   CREATININE 0.7 05/20/2014 1413      Component Value Date/Time   CALCIUM 9.1 09/01/2014 0903   CALCIUM 9.1 05/20/2014 1413   ALKPHOS 53 09/01/2014 0903   ALKPHOS 66 05/20/2014 1413   AST 23 09/01/2014 0903   AST 37* 05/20/2014 1413   ALT 23 09/01/2014 0903   ALT 34 05/20/2014 1413   BILITOT 0.5 09/01/2014 0903   BILITOT 0.74 05/20/2014 1413       Lab Results  Component Value Date   WBC 4.5 09/01/2014   HGB 12.9 09/01/2014   HCT 38.0 09/01/2014   MCV 90.5 09/01/2014   PLT 206.0 09/01/2014   NEUTROABS 2.4 09/01/2014     ASSESSMENT & PLAN:  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   Tamoxifen toxicities: Patient is now tolerating tamoxifen extremely well without any major problems or concerns.  Breast Cancer Surveillance: 1. Breast exam 05/30/2015: Normal 2. Mammogram 10/27/2014 No abnormalities. Postsurgical changes. Breast Density Category A. I recommended that she get 3-D  mammograms for surveillance. Discussed the differences between different breast density categories.  Survivorship:patient admitted to not doing any exercise and gaining weight. She will plan to go back to [facility] and doing exercise again. I gave instructions and the weight loss program that we started.  Return to clinic in 1 year for follow-up   No orders of the defined types were placed in this encounter.   The patient has a good understanding of the overall plan. she agrees with it. she will call with any problems that may develop before the next visit here.   Rulon Eisenmenger, MD 05/30/2015

## 2015-05-30 NOTE — Telephone Encounter (Signed)
Gave and printed appt sched and avs fo rpt for may 2018

## 2015-05-30 NOTE — Assessment & Plan Note (Signed)
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   Tamoxifen toxicities: Patient is now tolerating tamoxifen extremely well without any major problems or concerns.  Breast Cancer Surveillance: 1. Breast exam 05/30/2015: Normal 2. Mammogram 10/27/2014 No abnormalities. Postsurgical changes. Breast Density Category A. I recommended that she get 3-D mammograms for surveillance. Discussed the differences between different breast density categories.  Survivorship:patient admitted to not doing any exercise and gaining weight. She will plan to go back to [facility] and doing exercise again. I gave instructions and the weight loss program that we started.  Return to clinic in 1 year for follow-up

## 2015-06-03 ENCOUNTER — Other Ambulatory Visit: Payer: Self-pay | Admitting: Internal Medicine

## 2015-07-18 ENCOUNTER — Other Ambulatory Visit: Payer: Self-pay | Admitting: Hematology and Oncology

## 2015-07-18 NOTE — Telephone Encounter (Signed)
Chart reviewed.

## 2015-07-23 ENCOUNTER — Other Ambulatory Visit: Payer: Self-pay | Admitting: Internal Medicine

## 2015-07-25 NOTE — Telephone Encounter (Signed)
Rx refill sent to pharmacy. 

## 2015-08-25 ENCOUNTER — Other Ambulatory Visit: Payer: Self-pay | Admitting: Internal Medicine

## 2015-09-27 ENCOUNTER — Other Ambulatory Visit: Payer: Self-pay | Admitting: Internal Medicine

## 2015-10-03 ENCOUNTER — Ambulatory Visit: Admitting: Internal Medicine

## 2015-10-05 ENCOUNTER — Ambulatory Visit: Admitting: Internal Medicine

## 2015-10-28 ENCOUNTER — Other Ambulatory Visit: Payer: Self-pay | Admitting: Internal Medicine

## 2015-10-28 ENCOUNTER — Ambulatory Visit: Admitting: Internal Medicine

## 2015-11-01 LAB — HM MAMMOGRAPHY

## 2015-11-03 ENCOUNTER — Encounter: Payer: Self-pay | Admitting: Internal Medicine

## 2015-11-09 ENCOUNTER — Ambulatory Visit (INDEPENDENT_AMBULATORY_CARE_PROVIDER_SITE_OTHER): Payer: Medicare Other | Admitting: Internal Medicine

## 2015-11-09 ENCOUNTER — Encounter: Payer: Self-pay | Admitting: Internal Medicine

## 2015-11-09 VITALS — BP 120/74 | HR 66 | Temp 98.4°F | Resp 20 | Ht 62.0 in | Wt 211.4 lb

## 2015-11-09 DIAGNOSIS — I10 Essential (primary) hypertension: Secondary | ICD-10-CM | POA: Diagnosis not present

## 2015-11-09 DIAGNOSIS — G4733 Obstructive sleep apnea (adult) (pediatric): Secondary | ICD-10-CM

## 2015-11-09 DIAGNOSIS — E785 Hyperlipidemia, unspecified: Secondary | ICD-10-CM

## 2015-11-09 DIAGNOSIS — Z23 Encounter for immunization: Secondary | ICD-10-CM

## 2015-11-09 DIAGNOSIS — E232 Diabetes insipidus: Secondary | ICD-10-CM | POA: Diagnosis not present

## 2015-11-09 LAB — CBC WITH DIFFERENTIAL/PLATELET
BASOS ABS: 0 10*3/uL (ref 0.0–0.1)
Basophils Relative: 1.2 % (ref 0.0–3.0)
EOS ABS: 0.1 10*3/uL (ref 0.0–0.7)
Eosinophils Relative: 1.8 % (ref 0.0–5.0)
HCT: 36.9 % (ref 36.0–46.0)
Hemoglobin: 12.5 g/dL (ref 12.0–15.0)
Lymphocytes Relative: 31 % (ref 12.0–46.0)
Lymphs Abs: 1.3 10*3/uL (ref 0.7–4.0)
MCHC: 33.9 g/dL (ref 30.0–36.0)
MCV: 89.8 fl (ref 78.0–100.0)
Monocytes Absolute: 0.5 10*3/uL (ref 0.1–1.0)
Monocytes Relative: 10.9 % (ref 3.0–12.0)
NEUTROS ABS: 2.3 10*3/uL (ref 1.4–7.7)
Neutrophils Relative %: 55.1 % (ref 43.0–77.0)
PLATELETS: 230 10*3/uL (ref 150.0–400.0)
RBC: 4.11 Mil/uL (ref 3.87–5.11)
RDW: 13.4 % (ref 11.5–15.5)
WBC: 4.2 10*3/uL (ref 4.0–10.5)

## 2015-11-09 LAB — LIPID PANEL
CHOLESTEROL: 202 mg/dL — AB (ref 0–200)
HDL: 43.3 mg/dL (ref >39.00)
LDL Cholesterol: 123 mg/dL — ABNORMAL HIGH (ref 0–99)
NonHDL: 158.48
Total CHOL/HDL Ratio: 5
Triglycerides: 179 mg/dL — ABNORMAL HIGH (ref 0.0–149.0)
VLDL: 35.8 mg/dL (ref 0.0–40.0)

## 2015-11-09 LAB — COMPREHENSIVE METABOLIC PANEL
ALBUMIN: 4.1 g/dL (ref 3.5–5.2)
ALT: 25 U/L (ref 0–35)
AST: 27 U/L (ref 0–37)
Alkaline Phosphatase: 54 U/L (ref 39–117)
BILIRUBIN TOTAL: 0.5 mg/dL (ref 0.2–1.2)
BUN: 9 mg/dL (ref 6–23)
CALCIUM: 9.3 mg/dL (ref 8.4–10.5)
CO2: 32 meq/L (ref 19–32)
CREATININE: 0.55 mg/dL (ref 0.40–1.20)
Chloride: 105 mEq/L (ref 96–112)
GFR: 117.91 mL/min (ref >60.00)
Glucose, Bld: 98 mg/dL (ref 70–99)
Potassium: 4.2 mEq/L (ref 3.5–5.1)
Sodium: 145 mEq/L (ref 135–145)
Total Protein: 6.5 g/dL (ref 6.0–8.3)

## 2015-11-09 LAB — TSH: TSH: 1.2 u[IU]/mL (ref 0.35–4.50)

## 2015-11-09 NOTE — Patient Instructions (Signed)
Limit your sodium (Salt) intake  Please check your blood pressure on a regular basis.  If it is consistently greater than 150/90, please make an office appointment.     It is important that you exercise regularly, at least 20 minutes 3 to 4 times per week.  If you develop chest pain or shortness of breath seek  medical attention. 

## 2015-11-09 NOTE — Progress Notes (Signed)
Subjective:    Patient ID: Chelsea Moreno, female    DOB: [birth year], 65 y.o.   MRN: [id number]  HPI  65 year old patient who is seen today in follow-up.  She has a history depression, essential hypertension and dyslipidemia.  She also has a history of diabetes insipidus. She also has a history mild impaired glucose tolerance.  She is a bit concerned about symptomatic diabetes due to increasing fatigue, tingling of the feet and increasing thirst  Wt Readings from Last 3 Encounters:  11/09/15 211 lb 6.1 oz (95.9 kg)  05/30/15 214 lb 14.4 oz (97.5 kg)  04/04/15 207 lb (93.9 kg)    Past Medical History:  Diagnosis Date  . Breast cancer ([place]) 11/05/12   left  . Bursitis   . COLONIC POLYPS, HX OF 09/24/2008  . DEPRESSION 09/23/2007   takes Lexapro daily but hasn't taken since Sept 2014  . Diabetes insipidus ([place])    takes Desmopressin bid  . Diarrhea   . Dizziness    when nauseated gets dizzy  . Hemorrhoids   . History of bronchitis    yrs ago  . History of migraine    last time many yrs ago  . Hx of radiation therapy 01/29/13- 03/16/13   left breast 4500 cGy 25 sessions, left breast boost 1000 cGy 5 sessions  . HYPERLIPIDEMIA 09/06/2006   takes Atorvasatin nightly  . HYPERTENSION 09/06/2006   takes Lisinopril daily  . Muscle spasms of head and/or neck   . OSTEOPOROSIS 09/24/2008   takes Actonel every 30days  . Vitamin D deficiency    takes OTC Vit D     Social History   Social History  . Marital status: Widowed    Spouse name: N/A  . Number of children: N/A  . 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 Smoking status: Former Smoker    Packs/day: 0.50    Years: 4.00    Types: Cigarettes    Quit date: 01/07/1973  . Smokeless tobacco: Never Used     Comment: quit 63yrs ago  . Alcohol use 0.0 oz/week     Comment: socailly  . Drug use: No  . Sexual activity: Not Currently    Birth control/ protection:  Post-menopausal     Comment: menarche age 28, first live birth age 54, menopause 2002, no HRT   Other Topics Concern  . Not on file   Social History Narrative  . No narrative on file    Past Surgical History:  Procedure Laterality Date  . BREAST BIOPSY Left 53yrs ago  . BREAST LUMPECTOMY WITH NEEDLE LOCALIZATION Left 12/16/2012   Procedure: PARTIAL MASTECTOMY WITH NEEDLE LOCALIZATION;  Surgeon: Stark Klein, MD;  Location: [facility];  Service: General;  Laterality: Left;  1:00 NL at [facility]   . COLONOSCOPY      Family History  Problem Relation Age of Onset  . Ovarian cancer Mother   . Cancer Mother     Ovarian  . Heart disease Mother     Before age 36  . Heart attack Mother   . Varicose Veins Mother   . Heart disease Father     Before age 19  . Hyperlipidemia Father   . Hypertension Father   . Heart attack Father   . Heart disease Sister     Before age 55  . Hypertension Sister   . Heart attack Sister   .  Heart disease Brother     Before age 69  . Hypertension Brother   . Heart attack Brother     No Known Allergies  Current Outpatient Prescriptions on File Prior to Visit  Medication Sig Dispense Refill  . atorvastatin (LIPITOR) 80 MG tablet TAKE 1 TABLET (80 MG TOTAL) BY MOUTH DAILY. 90 tablet 0  . desmopressin (DDAVP NASAL) 0.01 % solution USE 1 SPRAY INTO EACH NOSTRIL 2 TIMES A DAY 5 mL 5  . escitalopram (LEXAPRO) 10 MG tablet TAKE 1 TABLET BY MOUTH DAILY 90 tablet 3  . latanoprost (XALATAN) 0.005 % ophthalmic solution Place 1 drop into both eyes at bedtime.    Marland Kitchen lisinopril (PRINIVIL,ZESTRIL) 20 MG tablet TAKE 1 TABLET (20 MG TOTAL) BY MOUTH DAILY. 90 tablet 1  . potassium chloride SA (K-DUR,KLOR-CON) 20 MEQ tablet Take 1 tablet (20 mEq total) by mouth 2 (two) times daily. 90 tablet 6  . tamoxifen (NOLVADEX) 20 MG tablet TAKE 1 TABLET BY MOUTH DAILY 90 tablet 3  . timolol (BETIMOL) 0.5 % ophthalmic solution Place 1 drop into both eyes daily.     No current  facility-administered medications on file prior to visit.     BP 120/74 (BP Location: Left Arm, Patient Position: Sitting, Cuff Size: Large)   Pulse 66   Temp 98.4 F (36.9 C) (Oral)   Resp 20   Ht 5\' 2"  (1.575 m)   Wt 211 lb 6.1 oz (95.9 kg)   SpO2 97%   BMI 38.66 kg/m     Review of Systems  Constitutional: Positive for fatigue.  HENT: Negative for congestion, dental problem, hearing loss, rhinorrhea, sinus pressure, sore throat and tinnitus.   Eyes: Negative for pain, discharge and visual disturbance.  Respiratory: Negative for cough and shortness of breath.   Cardiovascular: Negative for chest pain, palpitations and leg swelling.  Gastrointestinal: Negative for abdominal distention, abdominal pain, blood in stool, constipation, diarrhea, nausea and vomiting.  Endocrine: Negative for polydipsia, polyphagia and polyuria.  Genitourinary: Negative for difficulty urinating, dysuria, flank pain, frequency, hematuria, pelvic pain, urgency, vaginal bleeding, vaginal discharge and vaginal pain.  Musculoskeletal: Negative for arthralgias, gait problem and joint swelling.  Skin: Negative for rash.  Neurological: Positive for numbness. Negative for dizziness, syncope, speech difficulty, weakness and headaches.  Hematological: Negative for adenopathy.  Psychiatric/Behavioral: Negative for agitation, behavioral problems and dysphoric mood. The patient is not nervous/anxious.        Objective:   Physical Exam  Constitutional: She is oriented to person, place, and time. She appears well-developed and well-nourished.  Weight 211 Blood pressure 120/74  HENT:  Head: Normocephalic.  Right Ear: External ear normal.  Left Ear: External ear normal.  Mouth/Throat: Oropharynx is clear and moist.  Eyes: Conjunctivae and EOM are normal. Pupils are equal, round, and reactive to light.  Neck: Normal range of motion. Neck supple. No thyromegaly present.  Cardiovascular: Normal rate, regular rhythm,  normal heart sounds and intact distal pulses.   Pulmonary/Chest: Effort normal and breath sounds normal.  Abdominal: Soft. Bowel sounds are normal. She exhibits no mass. There is no tenderness.  Musculoskeletal: Normal range of motion.  Lymphadenopathy:    She has no cervical adenopathy.  Neurological: She is alert and oriented to person, place, and time.  Normal vibratory sensation and monofilament testing  Skin: Skin is warm and dry. No rash noted.  Psychiatric: She has a normal mood and affect. Her behavior is normal.  Assessment & Plan:   Hypertension.  Excellent control Dyslipidemia.  Will check a lipid profile Diabetes insipidus.  Will check electrolytes History breast cancer history depression  Recheck 6 months  Isaiha Asare Pilar Plate

## 2015-11-09 NOTE — Progress Notes (Signed)
Pre visit review using our clinic review tool, if applicable. No additional management support is needed unless otherwise documented below in the visit note. 

## 2015-11-23 ENCOUNTER — Ambulatory Visit: Admitting: Pulmonary Disease

## 2015-12-12 ENCOUNTER — Encounter: Payer: Self-pay | Admitting: Pulmonary Disease

## 2015-12-12 ENCOUNTER — Ambulatory Visit (INDEPENDENT_AMBULATORY_CARE_PROVIDER_SITE_OTHER): Payer: Medicare Other | Admitting: Pulmonary Disease

## 2015-12-12 VITALS — BP 144/86 | HR 58 | Ht 62.0 in | Wt 218.4 lb

## 2015-12-12 DIAGNOSIS — Z9989 Dependence on other enabling machines and devices: Secondary | ICD-10-CM

## 2015-12-12 DIAGNOSIS — G4733 Obstructive sleep apnea (adult) (pediatric): Secondary | ICD-10-CM | POA: Diagnosis not present

## 2015-12-12 NOTE — Progress Notes (Signed)
Current Outpatient Prescriptions on File Prior to Visit  Medication Sig  . atorvastatin (LIPITOR) 80 MG tablet TAKE 1 TABLET (80 MG TOTAL) BY MOUTH DAILY.  Marland Kitchen desmopressin (DDAVP NASAL) 0.01 % solution USE 1 SPRAY INTO EACH NOSTRIL 2 TIMES A DAY  . escitalopram (LEXAPRO) 10 MG tablet TAKE 1 TABLET BY MOUTH DAILY  . latanoprost (XALATAN) 0.005 % ophthalmic solution Place 1 drop into both eyes at bedtime.  Marland Kitchen lisinopril (PRINIVIL,ZESTRIL) 20 MG tablet TAKE 1 TABLET (20 MG TOTAL) BY MOUTH DAILY.  Marland Kitchen potassium chloride SA (K-DUR,KLOR-CON) 20 MEQ tablet Take 1 tablet (20 mEq total) by mouth 2 (two) times daily.  . tamoxifen (NOLVADEX) 20 MG tablet TAKE 1 TABLET BY MOUTH DAILY  . timolol (BETIMOL) 0.5 % ophthalmic solution Place 1 drop into both eyes daily.   No current facility-administered medications on file prior to visit.     Chief Complaint  Patient presents with  . Follow-up    Wears CPAP nightly. Denies problems with pressure. Pt reports being a mouth breather and wakes d/t dry mouth. DME [employer].     Sleep tests HST 09/01/14 >> AHI 17.8, SaO2 low 85% Auto CPAP 11/08/15 to 12/07/15 >> used on 30 of 30 nights with average 6 hrs 43 min.  Average AHI 1.1 with median CPAP 10 and 95 th percentile CPAP 14 cm H2O  Past medical history HTN, HLD, Migraines, Depression, DI, Breast cancer 2014 s/p XRT  Past surgical history, Family history, Social history, Allergies reviewed  Vital signs BP (!) 144/86 (BP Location: Left Arm, Cuff Size: Normal)   Pulse (!) 58   Ht 5\' 2"  (1.575 m)   Wt 218 lb 6.4 oz (99.1 kg)   SpO2 98%   BMI 39.95 kg/m   History of Present Illness: Chelsea Moreno is a 65 y.o. female with OSA.  Her only issue is with mouth dryness from CPAP.  She has nasal mask.  She has not tried increasing humidifier setting.  She gets about 7 hours sleep per night.  She naps for about an hour several days per week >> she doesn't use CPAP during naps.   Physical Exam:  General - No  distress ENT - No sinus tenderness, no oral exudate, no LAN, MP 3, elongated uvula, high arched palate Cardiac - s1s2 regular, no murmur Chest - No wheeze/rales/dullness Back - No focal tenderness Abd - Soft, non-tender Ext - No edema Neuro - Normal strength Skin - No rashes Psych - normal mood, and behavior   Assessment/Plan:  Obstructive sleep apnea. - She is compliant with CPAP and reports benefit - continue auto CPAP - will arrange for chin strap for CPAP mask - advised her to adjust temperature setting on CPAP humidifier - she can look up alternative CPAP mask options on like   Patient Instructions  Will arrange for chin strap for CPAP mask  Try adjusting temperature setting on humidifier for CPAP machine  Can look up [email or web address].[email or web address] to review mask options  Follow up in 1 year   Chesley Mires, MD [place] 12/12/2015, 9:29 AM Pager:  [phone] After 3pm call: [phone]

## 2015-12-12 NOTE — Patient Instructions (Signed)
Will arrange for chin strap for CPAP mask  Try adjusting temperature setting on humidifier for CPAP machine  Can look up [email or web address] to review mask options  Follow up in 1 year

## 2015-12-13 ENCOUNTER — Other Ambulatory Visit: Payer: Self-pay | Admitting: Obstetrics & Gynecology

## 2015-12-13 DIAGNOSIS — Z6839 Body mass index (BMI) 39.0-39.9, adult: Secondary | ICD-10-CM | POA: Diagnosis not present

## 2015-12-13 DIAGNOSIS — Z853 Personal history of malignant neoplasm of breast: Secondary | ICD-10-CM | POA: Diagnosis not present

## 2015-12-13 DIAGNOSIS — Z124 Encounter for screening for malignant neoplasm of cervix: Secondary | ICD-10-CM | POA: Diagnosis not present

## 2015-12-14 LAB — CYTOLOGY - PAP

## 2015-12-15 ENCOUNTER — Encounter: Payer: Self-pay | Admitting: Internal Medicine

## 2015-12-22 ENCOUNTER — Other Ambulatory Visit: Payer: Self-pay | Admitting: Internal Medicine

## 2016-01-16 DIAGNOSIS — H401131 Primary open-angle glaucoma, bilateral, mild stage: Secondary | ICD-10-CM | POA: Diagnosis not present

## 2016-01-16 DIAGNOSIS — C50312 Malignant neoplasm of lower-inner quadrant of left female breast: Secondary | ICD-10-CM | POA: Diagnosis not present

## 2016-03-21 ENCOUNTER — Other Ambulatory Visit: Payer: Self-pay | Admitting: Internal Medicine

## 2016-03-24 ENCOUNTER — Other Ambulatory Visit: Payer: Self-pay | Admitting: Internal Medicine

## 2016-05-09 ENCOUNTER — Ambulatory Visit: Payer: Medicare Other | Admitting: Internal Medicine

## 2016-05-23 ENCOUNTER — Other Ambulatory Visit: Payer: Self-pay | Admitting: Internal Medicine

## 2016-05-24 ENCOUNTER — Telehealth: Payer: Self-pay | Admitting: Hematology and Oncology

## 2016-05-24 NOTE — Telephone Encounter (Signed)
Pt called to r/s appt to 7/26 at 215pm

## 2016-05-28 ENCOUNTER — Ambulatory Visit: Admitting: Hematology and Oncology

## 2016-05-30 ENCOUNTER — Other Ambulatory Visit: Payer: Self-pay | Admitting: Internal Medicine

## 2016-06-04 ENCOUNTER — Ambulatory Visit: Admitting: Hematology and Oncology

## 2016-06-15 ENCOUNTER — Other Ambulatory Visit: Payer: Self-pay | Admitting: Internal Medicine

## 2016-06-18 ENCOUNTER — Other Ambulatory Visit: Payer: Self-pay | Admitting: Internal Medicine

## 2016-07-18 ENCOUNTER — Other Ambulatory Visit: Payer: Self-pay | Admitting: Internal Medicine

## 2016-07-27 ENCOUNTER — Other Ambulatory Visit: Payer: Self-pay | Admitting: Hematology and Oncology

## 2016-07-27 DIAGNOSIS — H40013 Open angle with borderline findings, low risk, bilateral: Secondary | ICD-10-CM | POA: Diagnosis not present

## 2016-07-27 DIAGNOSIS — H40053 Ocular hypertension, bilateral: Secondary | ICD-10-CM | POA: Diagnosis not present

## 2016-07-27 DIAGNOSIS — H5213 Myopia, bilateral: Secondary | ICD-10-CM | POA: Diagnosis not present

## 2016-07-27 DIAGNOSIS — H2513 Age-related nuclear cataract, bilateral: Secondary | ICD-10-CM | POA: Diagnosis not present

## 2016-08-01 ENCOUNTER — Telehealth: Payer: Self-pay | Admitting: Hematology and Oncology

## 2016-08-01 NOTE — Telephone Encounter (Signed)
SW PT TO CONFIRM R/S APPT TO 8/3 AT 1145 DUE TO VG OUT OF OFFICE

## 2016-08-02 ENCOUNTER — Ambulatory Visit: Payer: Medicare Other | Admitting: Hematology and Oncology

## 2016-08-09 ENCOUNTER — Ambulatory Visit: Payer: Medicare Other | Admitting: Hematology and Oncology

## 2016-08-10 ENCOUNTER — Encounter: Payer: Self-pay | Admitting: Hematology and Oncology

## 2016-08-10 ENCOUNTER — Ambulatory Visit (HOSPITAL_BASED_OUTPATIENT_CLINIC_OR_DEPARTMENT_OTHER): Payer: Medicare Other | Admitting: Hematology and Oncology

## 2016-08-10 DIAGNOSIS — Z79811 Long term (current) use of aromatase inhibitors: Secondary | ICD-10-CM

## 2016-08-10 DIAGNOSIS — D0512 Intraductal carcinoma in situ of left breast: Secondary | ICD-10-CM

## 2016-08-10 DIAGNOSIS — Z170 Estrogen receptor positive status [ER+]: Secondary | ICD-10-CM | POA: Diagnosis not present

## 2016-08-10 DIAGNOSIS — C50312 Malignant neoplasm of lower-inner quadrant of left female breast: Secondary | ICD-10-CM

## 2016-08-10 DIAGNOSIS — Z923 Personal history of irradiation: Secondary | ICD-10-CM

## 2016-08-10 MED ORDER — TAMOXIFEN CITRATE 20 MG PO TABS: 20.0000 mg | ORAL_TABLET | Freq: Every day | ORAL | 3 refills | Status: DC

## 2016-08-10 NOTE — Assessment & Plan Note (Signed)
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   Tamoxifen toxicities: Patient is now tolerating tamoxifen extremely well without any major problems or concerns.  Breast Cancer Surveillance: 1. Breast exam 08/10/2016: Normal 2. Mammogram 10/12/2015 at [facility] No abnormalities. Postsurgical changes. Breast Density Category A.  Return to clinic in 1 year for follow-up

## 2016-08-10 NOTE — Progress Notes (Signed)
Patient Care Team: Marletta Lor, MD as PCP - General  DIAGNOSIS:  Encounter Diagnosis  Name Primary?  . Malignant neoplasm of lower-inner quadrant of left breast in female, estrogen receptor positive ([place])     SUMMARY OF ONCOLOGIC HISTORY:   Cancer of lower-inner quadrant of left female breast ([place])   11/05/2012 Initial Diagnosis    DCIS with calcifications and necrosis ER 100% PR 100%      12/26/2012 Surgery    Left partial mastectomy: DCIS: Reexcision margins benign      01/30/2013 - 03/16/2013 Radiation Therapy    Radiation therapy adjuvant      03/17/2013 -  Anti-estrogen oral therapy    Adjuvant tamoxifen 20 mg daily plan is for 5 years       CHIEF COMPLIANT: Follow-up on adjuvant tamoxifen  INTERVAL HISTORY: Chelsea Moreno is a 66 year old with above-mentioned history left breast DCIS who is currently on adjuvant tamoxifen. She appears to be tolerating it very well. She denies any hot flashes or myalgias. She denies any lumps or nodules in the breasts. She has intermittent arthritis symptoms.  REVIEW OF SYSTEMS:   Constitutional: Denies fevers, chills or abnormal weight loss Eyes: Denies blurriness of vision Ears, nose, mouth, throat, and face: Denies mucositis or sore throat Respiratory: Denies cough, dyspnea or wheezes Cardiovascular: Denies palpitation, chest discomfort Gastrointestinal:  Denies nausea, heartburn or change in bowel habits Skin: Denies abnormal skin rashes Lymphatics: Denies new lymphadenopathy or easy bruising Neurological:Denies numbness, tingling or new weaknesses Behavioral/Psych: Mood is stable, no new changes  Extremities: No lower extremity edema Breast:  denies any pain or lumps or nodules in either breasts All other systems were reviewed with the patient and are negative.  I have reviewed the past medical history, past surgical history, social history and family history with the patient and they are unchanged from previous  note.  ALLERGIES:  has No Known Allergies.  MEDICATIONS:  Current Outpatient Prescriptions  Medication Sig Dispense Refill  . atorvastatin (LIPITOR) 80 MG tablet TAKE 1 TABLET (80 MG TOTAL) BY MOUTH DAILY. 90 tablet 0  . desmopressin (DDAVP NASAL) 0.01 % solution PLACE 1 SPRAY INTO EACH NOSTRIL 2 TIMES A DAY 10 mL 2  . escitalopram (LEXAPRO) 10 MG tablet TAKE 1 TABLET BY MOUTH DAILY 90 tablet 1  . latanoprost (XALATAN) 0.005 % ophthalmic solution Place 1 drop into both eyes at bedtime.    Marland Kitchen lisinopril (PRINIVIL,ZESTRIL) 20 MG tablet TAKE 1 TABLET (20 MG TOTAL) BY MOUTH DAILY. 90 tablet 0  . potassium chloride SA (K-DUR,KLOR-CON) 20 MEQ tablet Take 1 tablet (20 mEq total) by mouth 2 (two) times daily. 90 tablet 6  . tamoxifen (NOLVADEX) 20 MG tablet Take 1 tablet (20 mg total) by mouth daily. 90 tablet 3  . timolol (BETIMOL) 0.5 % ophthalmic solution Place 1 drop into both eyes daily.     No current facility-administered medications for this visit.     PHYSICAL EXAMINATION: ECOG PERFORMANCE STATUS: 0 - Asymptomatic  Vitals:   08/10/16 1137  BP: (!) 151/63  Pulse: 66  Resp: 17  Temp: 98.2 F (36.8 C)   Filed Weights   08/10/16 1137  Weight: 215 lb 14.4 oz (97.9 kg)    GENERAL:alert, no distress and comfortable SKIN: skin color, texture, turgor are normal, no rashes or significant lesions EYES: normal, Conjunctiva are pink and non-injected, sclera clear OROPHARYNX:no exudate, no erythema and lips, buccal mucosa, and tongue normal  NECK: supple, thyroid normal size, non-tender,  without nodularity LYMPH:  no palpable lymphadenopathy in the cervical, axillary or inguinal LUNGS: clear to auscultation and percussion with normal breathing effort HEART: regular rate & rhythm and no murmurs and no lower extremity edema ABDOMEN:abdomen soft, non-tender and normal bowel sounds MUSCULOSKELETAL:no cyanosis of digits and no clubbing  NEURO: alert & oriented x 3 with fluent speech, no  focal motor/sensory deficits EXTREMITIES: No lower extremity edema BREAST: No palpable masses or nodules in either right or left breasts. No palpable axillary supraclavicular or infraclavicular adenopathy no breast tenderness or nipple discharge. (exam performed in the presence of a chaperone)  LABORATORY DATA:  I have reviewed the data as listed   Chemistry      Component Value Date/Time   NA 145 11/09/2015 1116   NA 140 05/20/2014 1413   K 4.2 11/09/2015 1116   K 4.2 05/20/2014 1413   CL 105 11/09/2015 1116   CO2 32 11/09/2015 1116   CO2 24 05/20/2014 1413   BUN 9 11/09/2015 1116   BUN 9.0 05/20/2014 1413   CREATININE 0.55 11/09/2015 1116   CREATININE 0.7 05/20/2014 1413      Component Value Date/Time   CALCIUM 9.3 11/09/2015 1116   CALCIUM 9.1 05/20/2014 1413   ALKPHOS 54 11/09/2015 1116   ALKPHOS 66 05/20/2014 1413   AST 27 11/09/2015 1116   AST 37 (H) 05/20/2014 1413   ALT 25 11/09/2015 1116   ALT 34 05/20/2014 1413   BILITOT 0.5 11/09/2015 1116   BILITOT 0.74 05/20/2014 1413       Lab Results  Component Value Date   WBC 4.2 11/09/2015   HGB 12.5 11/09/2015   HCT 36.9 11/09/2015   MCV 89.8 11/09/2015   PLT 230.0 11/09/2015   NEUTROABS 2.3 11/09/2015    ASSESSMENT & PLAN:  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   Tamoxifen toxicities: Patient is now tolerating tamoxifen extremely well without any major problems or concerns.  Breast Cancer Surveillance: 1. Breast exam 08/10/2016: Normal 2. Mammogram 10/12/2015 at [facility] No abnormalities. Postsurgical changes. Breast Density Category A.  Return to clinic in 1 year for follow-up  I spent 15 minutes talking to the patient of which more than half was spent in counseling and coordination of care.  No orders of the defined types were placed in this encounter.  The patient has a good  understanding of the overall plan. she agrees with it. she will call with any problems that may develop before the next visit here.   Rulon Eisenmenger, MD 08/10/16

## 2016-08-16 ENCOUNTER — Telehealth: Payer: Self-pay

## 2016-08-16 NOTE — Telephone Encounter (Signed)
Spoke with patient about up coming appt. For 08/12/2017.

## 2016-09-13 ENCOUNTER — Other Ambulatory Visit: Payer: Self-pay | Admitting: Internal Medicine

## 2016-09-27 ENCOUNTER — Encounter: Payer: Self-pay | Admitting: Internal Medicine

## 2016-09-28 ENCOUNTER — Other Ambulatory Visit: Payer: Self-pay | Admitting: Internal Medicine

## 2016-11-01 DIAGNOSIS — R928 Other abnormal and inconclusive findings on diagnostic imaging of breast: Secondary | ICD-10-CM | POA: Diagnosis not present

## 2016-11-01 DIAGNOSIS — M8588 Other specified disorders of bone density and structure, other site: Secondary | ICD-10-CM | POA: Diagnosis not present

## 2016-11-01 DIAGNOSIS — Z853 Personal history of malignant neoplasm of breast: Secondary | ICD-10-CM | POA: Diagnosis not present

## 2016-11-01 LAB — HM DEXA SCAN

## 2016-11-01 LAB — HM MAMMOGRAPHY

## 2016-11-07 ENCOUNTER — Encounter: Payer: Self-pay | Admitting: Internal Medicine

## 2016-11-25 ENCOUNTER — Other Ambulatory Visit: Payer: Self-pay | Admitting: Internal Medicine

## 2016-12-12 ENCOUNTER — Other Ambulatory Visit: Payer: Self-pay | Admitting: Internal Medicine

## 2016-12-12 NOTE — Telephone Encounter (Signed)
Pt's last Ov was 11/09/15, pt needs office visit for refill.

## 2016-12-21 DIAGNOSIS — C50312 Malignant neoplasm of lower-inner quadrant of left female breast: Secondary | ICD-10-CM | POA: Diagnosis not present

## 2016-12-21 DIAGNOSIS — Z8041 Family history of malignant neoplasm of ovary: Secondary | ICD-10-CM | POA: Diagnosis not present

## 2016-12-25 ENCOUNTER — Other Ambulatory Visit: Payer: Self-pay | Admitting: Internal Medicine

## 2016-12-25 ENCOUNTER — Telehealth: Payer: Self-pay | Admitting: Genetic Counselor

## 2016-12-25 NOTE — Telephone Encounter (Signed)
12/25/16 called patient @ 11:50 am and confirmed appointment with Roma Kayser on 02/13/17 @ 9:00 am for Genetics counseling.  Patient is aware to arrive 30 minutes early to register in.

## 2017-01-09 ENCOUNTER — Other Ambulatory Visit: Payer: Self-pay | Admitting: Internal Medicine

## 2017-01-15 ENCOUNTER — Encounter: Payer: Self-pay | Admitting: Internal Medicine

## 2017-01-15 ENCOUNTER — Ambulatory Visit (INDEPENDENT_AMBULATORY_CARE_PROVIDER_SITE_OTHER): Payer: Medicare Other | Admitting: Internal Medicine

## 2017-01-15 VITALS — BP 140/80 | HR 75 | Temp 98.8°F | Ht 62.0 in | Wt 219.2 lb

## 2017-01-15 DIAGNOSIS — E785 Hyperlipidemia, unspecified: Secondary | ICD-10-CM

## 2017-01-15 DIAGNOSIS — Z23 Encounter for immunization: Secondary | ICD-10-CM | POA: Diagnosis not present

## 2017-01-15 DIAGNOSIS — M7061 Trochanteric bursitis, right hip: Secondary | ICD-10-CM

## 2017-01-15 DIAGNOSIS — E232 Diabetes insipidus: Secondary | ICD-10-CM | POA: Diagnosis not present

## 2017-01-15 DIAGNOSIS — I10 Essential (primary) hypertension: Secondary | ICD-10-CM | POA: Diagnosis not present

## 2017-01-15 NOTE — Patient Instructions (Addendum)
Limit your sodium (Salt) intake  Please check your blood pressure on a regular basis.  If it is consistently greater than 150/90, please make an office appointment.    It is important that you exercise regularly, at least 20 minutes 3 to 4 times per week.  If you develop chest pain or shortness of breath seek  medical attention.  You need to lose weight.  Consider a lower calorie diet and regular exercise.   Hip Bursitis Hip bursitis is inflammation of a fluid-filled sac (bursa) in the hip joint. The bursa protects the bones in the hip joint from rubbing against each other. Hip bursitis can cause mild to moderate pain, and symptoms often come and go over time. What are the causes? This condition may be caused by:  Injury to the hip.  Overuse of the muscles that surround the hip joint.  Arthritis or gout.  Diabetes.  Thyroid disease.  Cold weather.  Infection.  In some cases, the cause may not be known. What are the signs or symptoms? Symptoms of this condition may include:  Mild or moderate pain in the hip area. Pain may get worse with movement.  Tenderness and swelling of the hip, especially on the outer side of the hip.  Symptoms may come and go. If the bursa becomes infected, you may have the following symptoms:  Fever.  Red skin and a feeling of warmth in the hip area.  How is this diagnosed? This condition may be diagnosed based on:  A physical exam.  Your medical history.  X-rays.  Removal of fluid from your inflamed bursa for testing (biopsy).  You may be sent to a health care provider who specializes in bone diseases (orthopedist) or a provider who specializes in joint inflammation (rheumatologist). How is this treated? This condition is treated by resting, raising (elevating), and applying pressure(compression) to the injured area. In some cases, this may be enough to make your symptoms go away. Treatment may also include:  Crutches.  Antibiotic  medicine.  Draining fluid out of the bursa to help relieve swelling.  Injecting medicine that helps to reduce inflammation (cortisone).  Follow these instructions at home: Medicines  Take over-the-counter and prescription medicines only as told by your health care provider.  Do not drive or operate heavy machinery while taking prescription pain medicine, or as told by your health care provider.  If you were prescribed an antibiotic, take it as told by your health care provider. Do not stop taking the antibiotic even if you start to feel better. Activity  Return to your normal activities as told by your health care provider. Ask your health care provider what activities are safe for you.  Rest and protect your hip as much as possible until your pain and swelling get better. General instructions  Wear compression wraps only as told by your health care provider.  Elevate your hip above the level of your heart as much as you can without pain. To do this, try putting a pillow under your hips while you lie down.  Do not use your hip to support your body weight until your health care provider says that you can. Use crutches as told by your health care provider.  Gently massage and stretch your injured area as often as is comfortable.  Keep all follow-up visits as told by your health care provider. This is important. How is this prevented?  Exercise regularly, as told by your health care provider.  Warm up and stretch  before being active.  Cool down and stretch after being active.  If an activity irritates your hip or causes pain, avoid the activity as much as possible.  Avoid sitting down for long periods at a time. Contact a health care provider if:  You have a fever.  You develop new symptoms.  You have difficulty walking or doing everyday activities.  You have pain that gets worse or does not get better with medicine.  You develop red skin or a feeling of warmth in your  hip area. Get help right away if:  You cannot move your hip.  You have severe pain. This information is not intended to replace advice given to you by your health care provider. Make sure you discuss any questions you have with your health care provider. Document Released: 06/16/2001 Document Revised: 06/02/2015 Document Reviewed: 07/27/2014 Elsevier Interactive Patient Education  [employer].

## 2017-01-15 NOTE — Progress Notes (Signed)
Subjective:    Patient ID: Chelsea Moreno, female    DOB: August 11, 1950, 67 y.o.   MRN: [id number]  HPI  67 year old patient who is seen today for follow-up.  She has essential hypertension and dyslipidemia.  She is followed by oncology with a history of left breast cancer.  She is also followed by general surgery.  Blood pressures have been well controlled at various doctor and dentist offices.  Her only complaint is some right hip pain of 3 months duration  Past Medical History:  Diagnosis Date  . Breast cancer ([place]) 11/05/12   left  . Bursitis   . COLONIC POLYPS, HX OF 09/24/2008  . DEPRESSION 09/23/2007   takes Lexapro daily but hasn't taken since Sept 2014  . Diabetes insipidus ([place])    takes Desmopressin bid  . Diarrhea   . Dizziness    when nauseated gets dizzy  . Hemorrhoids   . History of bronchitis    yrs ago  . History of migraine    last time many yrs ago  . Hx of radiation therapy 01/29/13- 03/16/13   left breast 4500 cGy 25 sessions, left breast boost 1000 cGy 5 sessions  . HYPERLIPIDEMIA 09/06/2006   takes Atorvasatin nightly  . HYPERTENSION 09/06/2006   takes Lisinopril daily  . Muscle spasms of head and/or neck   . OSTEOPOROSIS 09/24/2008   takes Actonel every 30days  . Vitamin D deficiency    takes OTC Vit D     Social History   Socioeconomic History  . Marital status: Widowed    Spouse name: Not on file  . Number of children: Not on file  . Years of education: Not on file  . Highest education level: Not on file  Social Needs  . Financial resource strain: Not on file  . Food insecurity - worry: Not on file  . Food insecurity - inability: Not on file  . Transportation needs - medical: Not on file  . Transportation needs - non-medical: Not on file  Occupational History  . Occupation: Professor at [employer]  . Smoking status: Former Smoker    Packs/day: 0.50    Years: 4.00    Pack years: 2.00    Types: Cigarettes    Last attempt to quit:  01/07/1973    Years since quitting: 44.0  . Smokeless tobacco: Never Used  . Tobacco comment: quit 30yrs ago  Substance and Sexual Activity  . Alcohol use: Yes    Alcohol/week: 0.0 oz    Comment: socailly  . Drug use: No  . Sexual activity: Not Currently    Birth control/protection: Post-menopausal    Comment: menarche age 59, first live birth age 75, menopause 2002, no HRT  Other Topics Concern  . Not on file  Social History Narrative  . Not on file    Past Surgical History:  Procedure Laterality Date  . BREAST BIOPSY Left 46yrs ago  . BREAST LUMPECTOMY WITH NEEDLE LOCALIZATION Left 12/16/2012   Procedure: PARTIAL MASTECTOMY WITH NEEDLE LOCALIZATION;  Surgeon: Stark Klein, MD;  Location: [facility];  Service: General;  Laterality: Left;  1:00 NL at [place]   . COLONOSCOPY      Family History  Problem Relation Age of Onset  . Ovarian cancer Mother   . Cancer Mother        Ovarian  . Heart disease Mother        Before age 75  . Heart attack Mother   . Varicose  Veins Mother   . Heart disease Father        Before age 62  . Hyperlipidemia Father   . Hypertension Father   . Heart attack Father   . Heart disease Sister        Before age 67  . Hypertension Sister   . Heart attack Sister   . Heart disease Brother        Before age 77  . Hypertension Brother   . Heart attack Brother     No Known Allergies  Current Outpatient Medications on File Prior to Visit  Medication Sig Dispense Refill  . atorvastatin (LIPITOR) 80 MG tablet TAKE 1 TABLET (80 MG TOTAL) BY MOUTH DAILY. 90 tablet 0  . desmopressin (DDAVP NASAL) 0.01 % solution PLACE 1 SPRAY INTO EACH NOSTRIL 2 TIMES A DAY 10 mL 4  . escitalopram (LEXAPRO) 10 MG tablet TAKE 1 TABLET BY MOUTH DAILY 90 tablet 0  . latanoprost (XALATAN) 0.005 % ophthalmic solution Place 1 drop into both eyes at bedtime.    Marland Kitchen lisinopril (PRINIVIL,ZESTRIL) 20 MG tablet TAKE 1 TABLET (20 MG TOTAL) BY MOUTH DAILY. 90 tablet 0  . potassium  chloride SA (K-DUR,KLOR-CON) 20 MEQ tablet Take 1 tablet (20 mEq total) by mouth 2 (two) times daily. 90 tablet 6  . tamoxifen (NOLVADEX) 20 MG tablet Take 1 tablet (20 mg total) by mouth daily. 90 tablet 3  . timolol (BETIMOL) 0.5 % ophthalmic solution Place 1 drop into both eyes daily.     No current facility-administered medications on file prior to visit.     BP 140/80 (BP Location: Left Arm, Patient Position: Sitting, Cuff Size: Large)   Pulse 75   Temp 98.8 F (37.1 C) (Oral)   Ht 5\' 2"  (1.575 m)   Wt 219 lb 3.2 oz (99.4 kg)   SpO2 98%   BMI 40.09 kg/m     Review of Systems  Constitutional: Negative.   HENT: Negative for congestion, dental problem, hearing loss, rhinorrhea, sinus pressure, sore throat and tinnitus.   Eyes: Negative for pain, discharge and visual disturbance.  Respiratory: Negative for cough and shortness of breath.   Cardiovascular: Negative for chest pain, palpitations and leg swelling.  Gastrointestinal: Negative for abdominal distention, abdominal pain, blood in stool, constipation, diarrhea, nausea and vomiting.  Genitourinary: Negative for difficulty urinating, dysuria, flank pain, frequency, hematuria, pelvic pain, urgency, vaginal bleeding, vaginal discharge and vaginal pain.  Musculoskeletal: Negative for arthralgias, gait problem and joint swelling.       Right hip pain  Skin: Negative for rash.  Neurological: Negative for dizziness, syncope, speech difficulty, weakness, numbness and headaches.  Hematological: Negative for adenopathy.  Psychiatric/Behavioral: Negative for agitation, behavioral problems and dysphoric mood. The patient is not nervous/anxious.        Objective:   Physical Exam  Constitutional: She is oriented to person, place, and time. She appears well-developed and well-nourished.  HENT:  Head: Normocephalic and atraumatic.  Right Ear: External ear normal.  Left Ear: External ear normal.  Mouth/Throat: Oropharynx is clear and  moist.  Eyes: Conjunctivae and EOM are normal. Pupils are equal, round, and reactive to light.  Neck: Normal range of motion. Neck supple. No JVD present. No thyromegaly present.  Cardiovascular: Normal rate, regular rhythm, normal heart sounds and intact distal pulses.  No murmur heard. Pulmonary/Chest: Effort normal and breath sounds normal. She has no wheezes. She has no rales.  Abdominal: Soft. Bowel sounds are normal. She exhibits no distension  and no mass. There is no tenderness. There is no rebound and no guarding.  Musculoskeletal: Normal range of motion. She exhibits no edema or tenderness.  Slight tenderness over the right greater trochanter Range of motion of the hip intact with only minimal discomfort with full hip flexion  Lymphadenopathy:    She has no cervical adenopathy.  Neurological: She is alert and oriented to person, place, and time. She has normal reflexes. No cranial nerve deficit. She exhibits normal muscle tone. Coordination normal.  Skin: Skin is warm and dry. No rash noted.  Psychiatric: She has a normal mood and affect. Her behavior is normal.          Assessment & Plan:   Right hip pain.  Favor mild right greater trochanteric bursitis Hypertension Dyslipidemia continue statin therapy History of breast cancer oncology follow-up  CPX 6 months Flu vaccine administered today  [name]

## 2017-02-13 ENCOUNTER — Inpatient Hospital Stay: Payer: Medicare Other | Attending: Genetic Counselor | Admitting: Genetic Counselor

## 2017-02-13 ENCOUNTER — Inpatient Hospital Stay: Payer: Medicare Other

## 2017-02-13 ENCOUNTER — Encounter: Payer: Self-pay | Admitting: Genetic Counselor

## 2017-02-13 DIAGNOSIS — C50312 Malignant neoplasm of lower-inner quadrant of left female breast: Secondary | ICD-10-CM | POA: Diagnosis not present

## 2017-02-13 DIAGNOSIS — Z315 Encounter for genetic counseling: Secondary | ICD-10-CM | POA: Diagnosis not present

## 2017-02-13 DIAGNOSIS — Z809 Family history of malignant neoplasm, unspecified: Secondary | ICD-10-CM

## 2017-02-13 DIAGNOSIS — Z86000 Personal history of in-situ neoplasm of breast: Secondary | ICD-10-CM

## 2017-02-13 DIAGNOSIS — Z170 Estrogen receptor positive status [ER+]: Principal | ICD-10-CM

## 2017-02-13 DIAGNOSIS — Z8041 Family history of malignant neoplasm of ovary: Secondary | ICD-10-CM | POA: Diagnosis not present

## 2017-02-13 NOTE — Progress Notes (Signed)
REFERRING PROVIDER: Stark Klein, MD [address] [place], [place] [address]  PRIMARY PROVIDER:  Marletta Lor, MD  PRIMARY REASON FOR VISIT:  1. Malignant neoplasm of lower-inner quadrant of left breast in female, estrogen receptor positive ([place])   2. Family history of ovarian cancer      HISTORY OF PRESENT ILLNESS:   Chelsea Moreno, a 67 y.o. female, was seen for a [facility] cancer genetics consultation at the request of Dr. Barry Dienes due to a personal and family history of cancer.  Chelsea Moreno presents to clinic today to discuss the possibility of a hereditary predisposition to cancer, genetic testing, and to further clarify her future cancer risks, as well as potential cancer risks for family members.   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 This was treated with partial mastectomy, radiation and tamoxifen for 5 years. The patient did not have previous genetic testing.     CANCER HISTORY:    Cancer of lower-inner quadrant of left female breast ([place])   11/05/2012 Initial Diagnosis    DCIS with calcifications and necrosis ER 100% PR 100%      12/26/2012 Surgery    Left partial mastectomy: DCIS: Reexcision margins benign      01/30/2013 - 03/16/2013 Radiation Therapy    Radiation therapy adjuvant      03/17/2013 -  Anti-estrogen oral therapy    Adjuvant tamoxifen 20 mg daily plan is for 5 years        HORMONAL RISK FACTORS:  Menarche was at age 42.  First live birth at age 51.  OCP use for approximately <5 years.  Ovaries intact: yes.  Hysterectomy: no.  Menopausal status: postmenopausal.  HRT use: 0 years. Colonoscopy: yes; a few polyps. Mammogram within the last year: yes. Number of breast biopsies: 2. Up to date with pelvic exams:  yes. Any excessive radiation exposure in the past:  Breast cancer treatment  Past Medical History:  Diagnosis Date  . Breast cancer ([place]) 11/05/12   left  . Bursitis   .  COLONIC POLYPS, HX OF 09/24/2008  . DEPRESSION 09/23/2007   takes Lexapro daily but hasn't taken since Sept 2014  . Diabetes insipidus ([place])    takes Desmopressin bid  . Diarrhea   . Dizziness    when nauseated gets dizzy  . Family history of ovarian cancer   . Hemorrhoids   . History of bronchitis    yrs ago  . History of migraine    last time many yrs ago  . Hx of radiation therapy 01/29/13- 03/16/13   left breast 4500 cGy 25 sessions, left breast boost 1000 cGy 5 sessions  . HYPERLIPIDEMIA 09/06/2006   takes Atorvasatin nightly  . HYPERTENSION 09/06/2006   takes Lisinopril daily  . Muscle spasms of head and/or neck   . OSTEOPOROSIS 09/24/2008   takes Actonel every 30days  . Vitamin D deficiency    takes OTC Vit D    Past Surgical History:  Procedure Laterality Date  . BREAST BIOPSY Left 39yr ago  . BREAST LUMPECTOMY WITH NEEDLE LOCALIZATION Left 12/16/2012   Procedure: PARTIAL MASTECTOMY WITH NEEDLE LOCALIZATION;  Surgeon: FStark Klein MD;  Location: [facility]  Service: General;  Laterality: Left;  1:00 NL at [place]  . COLONOSCOPY      Social History   Socioeconomic History  . Marital status: Widowed    Spouse name: Not on file  . Number  of children: Not on file  . Years of education: Not on file  . Highest education level: Not on file  Social Needs  . Financial resource strain: Not on file  . Food insecurity - worry: Not on file  . Food insecurity - inability: Not on file  . Transportation needs - medical: Not on file  . Transportation needs - non-medical: Not on file  Occupational History  . Occupation: Professor at [employer]  . Smoking status: Former Smoker    Packs/day: 0.50    Years: 4.00    Pack years: 2.00    Types: Cigarettes    Last attempt to quit: 01/07/1973    Years since quitting: 44.1  . Smokeless tobacco: Never Used  . Tobacco comment: quit 46yr ago  Substance and Sexual Activity  . Alcohol use: Yes    Alcohol/week: 0.0 oz    Comment:  socailly  . Drug use: No  . Sexual activity: Not Currently    Birth control/protection: Post-menopausal    Comment: menarche age 67 first live birth age 67 menopause 2002, no HRT  Other Topics Concern  . Not on file  Social History Narrative  . Not on file     FAMILY HISTORY:  We obtained a detailed, 4-generation family history.  Significant diagnoses are listed below: Family History  Problem Relation Age of Onset  . Ovarian cancer Mother   . Cancer Mother        Ovarian  . Heart disease Mother        Before age 217 . Heart attack Mother   . Varicose Veins Mother   . Heart disease Father        Before age 241 . Hyperlipidemia Father   . Hypertension Father   . Heart attack Father   . Heart disease Sister        Before age 261 . Hypertension Sister   . Heart attack Sister 51      second heart attack at 516 . Heart disease Brother        Before age 255 . Hypertension Brother   . Heart attack Brother 442 . Cancer Maternal Aunt        GYN cancer  . Heart attack Maternal Grandmother   . Alcohol abuse Maternal Grandfather   . Parkinson's disease Sister   . ALS Brother     The patient has one son and two grandsons who are cancer free.  She has two brothers and two sisters.  One brother died of ALS, the other brother had a heart attack at 432 one sister has been diagnosed with parkinson disease, and the other sister has had two heart attacks, one at 564and one at 553  Both parents are deceased.  The patient's father died of a heart attack at 580  His family history is unknown.  The patient's mother was diagnosed and died of ovarian cancer at 651  She had 10 siblings.  One sister had a GYN cancer.  A couple cousins also had cancer, one with lung and one with liver.  The patient's maternal grandparents are deceased.  The grandmother died of a heart attack and the grandfather died from complications of alcoholism.  Chelsea Moreno unaware of previous family history of genetic  testing for hereditary cancer risks. Patient's maternal ancestors are of [place], and paternal ancestors are of [place]. There is no reported Ashkenazi Jewish ancestry.  There is no known consanguinity.  GENETIC COUNSELING ASSESSMENT: Chelsea Moreno is a 67 y.o. female with a personal and family history of cancer which is somewhat suggestive of a hereditary cancer syndrome and predisposition to cancer. We, therefore, discussed and recommended the following at today's visit.   DISCUSSION: We discussed that about 5-10% of breast cancer is hereditary with most cases due to BRCA mutations.  There are other genes associated with both breast and ovarian cancer, as well as each cancer individually.  We reviewed the characteristics, features and inheritance patterns of hereditary cancer syndromes. We also discussed genetic testing, including the appropriate family members to test, the process of testing, insurance coverage and turn-around-time for results. We discussed the implications of a negative, positive and/or variant of uncertain significant result. We recommended Chelsea Moreno pursue genetic testing for the common hereditary gene panel. The Hereditary Gene Panel offered by [facility] includes sequencing and/or deletion duplication testing of the following 47 genes: APC, ATM, AXIN2, BARD1, BMPR1A, BRCA1, BRCA2, BRIP1, CDH1, CDK4, CDKN2A (p14ARF), CDKN2A (p16INK4a), CHEK2, CTNNA1, DICER1, EPCAM (Deletion/duplication testing only), GREM1 (promoter region deletion/duplication testing only), KIT, MEN1, MLH1, MSH2, MSH3, MSH6, MUTYH, NBN, NF1, NHTL1, PALB2, PDGFRA, PMS2, POLD1, POLE, PTEN, RAD50, RAD51C, RAD51D, SDHB, SDHC, SDHD, SMAD4, SMARCA4. STK11, TP53, TSC1, TSC2, and VHL.  The following genes were evaluated for sequence changes only: SDHA and HOXB13 c.251G>A variant only.   Based on Chelsea Moreno personal and family history of cancer, she meets medical criteria for genetic testing. Despite  that she meets criteria, she may still have an out of pocket cost. We discussed that if her out of pocket cost for testing is over $100, the laboratory will call and confirm whether she wants to proceed with testing.  If the out of pocket cost of testing is less than $100 she will be billed by the genetic testing laboratory.   PLAN: After considering the risks, benefits, and limitations, Chelsea Moreno  provided informed consent to pursue genetic testing and the blood sample was sent to [facility] for analysis of the common hereditary cancer panel. Results should be available within approximately 2-3 weeks' time, at which point they will be disclosed by telephone to Chelsea Moreno, as will any additional recommendations warranted by these results. Chelsea Moreno will receive a summary of her genetic counseling visit and a copy of her results once available. This information will also be available in Epic. We encouraged Chelsea Moreno to remain in contact with cancer genetics annually so that we can continuously update the family history and inform her of any changes in cancer genetics and testing that may be of benefit for her family. Chelsea Moreno questions were answered to her satisfaction today. Our contact information was provided should additional questions or concerns arise.  Lastly, we encouraged Chelsea Moreno to remain in contact with cancer genetics annually so that we can continuously update the family history and inform her of any changes in cancer genetics and testing that may be of benefit for this family.   Ms.  Moreno questions were answered to her satisfaction today. Our contact information was provided should additional questions or concerns arise. Thank you for the referral and allowing [place] to share in the care of your patient.   Karen P. Florene Glen, [place], [facility] Certified Genetic Counselor [email or web address] phone: [phone]  The patient was seen for a total of 45 minutes in face-to-face  genetic counseling.  This patient was discussed with Drs. Magrinat, Lindi Adie and/or Burr Medico who agrees with  the above.    _______________________________________________________________________ For Office Staff:  Number of people involved in session: 1 Was an Intern/ student involved with case: yes: Donnetta Hail

## 2017-02-19 ENCOUNTER — Other Ambulatory Visit: Payer: Self-pay | Admitting: Internal Medicine

## 2017-02-21 ENCOUNTER — Other Ambulatory Visit: Payer: Self-pay | Admitting: Internal Medicine

## 2017-02-22 NOTE — Telephone Encounter (Signed)
Medication filled to pharmacy as requested.   

## 2017-02-26 ENCOUNTER — Encounter: Payer: Self-pay | Admitting: Genetic Counselor

## 2017-02-26 ENCOUNTER — Telehealth: Payer: Self-pay | Admitting: Genetic Counselor

## 2017-02-26 DIAGNOSIS — Z1379 Encounter for other screening for genetic and chromosomal anomalies: Secondary | ICD-10-CM | POA: Insufficient documentation

## 2017-02-26 NOTE — Telephone Encounter (Signed)
LM on VM with good news.  Asked that she CB. 

## 2017-03-05 ENCOUNTER — Ambulatory Visit: Payer: Self-pay | Admitting: Genetic Counselor

## 2017-03-05 DIAGNOSIS — Z1379 Encounter for other screening for genetic and chromosomal anomalies: Secondary | ICD-10-CM

## 2017-03-05 DIAGNOSIS — Z170 Estrogen receptor positive status [ER+]: Secondary | ICD-10-CM

## 2017-03-05 DIAGNOSIS — C50312 Malignant neoplasm of lower-inner quadrant of left female breast: Secondary | ICD-10-CM

## 2017-03-05 DIAGNOSIS — Z8041 Family history of malignant neoplasm of ovary: Secondary | ICD-10-CM

## 2017-03-05 NOTE — Telephone Encounter (Signed)
Revealed negative genetic testing.  Discussed that we do not know why she has breast cancer or why there is cancer in the family. It could be due to a different gene that we are not testing, or maybe our current technology may not be able to pick something up.  It will be important for her to keep in contact with genetics to keep up with whether additional testing may be needed. 

## 2017-03-05 NOTE — Progress Notes (Signed)
HPI: Ms. Tetro was previously seen in the [facility] due to a personal and family history of cancer and concerns regarding a hereditary predisposition to cancer. Please refer to our prior cancer genetics clinic note for more information regarding Ms. Johnson's medical, social and family histories, and our assessment and recommendations, at the time. Ms. Pretty's recent genetic test results were disclosed to her, as were recommendations warranted by these results. These results and recommendations are discussed in more detail below.  CANCER HISTORY:    Cancer of lower-inner quadrant of left female breast ([place])   11/05/2012 Initial Diagnosis    DCIS with calcifications and necrosis ER 100% PR 100%      12/26/2012 Surgery    Left partial mastectomy: DCIS: Reexcision margins benign      01/30/2013 - 03/16/2013 Radiation Therapy    Radiation therapy adjuvant      03/17/2013 -  Anti-estrogen oral therapy    Adjuvant tamoxifen 20 mg daily plan is for 5 years      02/19/2017 Genetic Testing    Negative genetic testing on the common hereditary cancer panel.  The Hereditary Gene Panel offered by [facility] includes sequencing and/or deletion duplication testing of the following 47 genes: APC, ATM, AXIN2, BARD1, BMPR1A, BRCA1, BRCA2, BRIP1, CDH1, CDK4, CDKN2A (p14ARF), CDKN2A (p16INK4a), CHEK2, CTNNA1, DICER1, EPCAM (Deletion/duplication testing only), GREM1 (promoter region deletion/duplication testing only), KIT, MEN1, MLH1, MSH2, MSH3, MSH6, MUTYH, NBN, NF1, NHTL1, PALB2, PDGFRA, PMS2, POLD1, POLE, PTEN, RAD50, RAD51C, RAD51D, SDHB, SDHC, SDHD, SMAD4, SMARCA4. STK11, TP53, TSC1, TSC2, and VHL.  The following genes were evaluated for sequence changes only: SDHA and HOXB13 c.251G>A variant only. The report date is February 19, 2017.       FAMILY HISTORY:  We obtained a detailed, 4-generation family history.  Significant diagnoses are listed below: Family History  Problem Relation  Age of Onset  . Ovarian cancer Mother   . Cancer Mother        Ovarian  . Heart disease Mother        Before age 29  . Heart attack Mother   . Varicose Veins Mother   . Heart disease Father        Before age 84  . Hyperlipidemia Father   . Hypertension Father   . Heart attack Father   . Heart disease Sister        Before age 23  . Hypertension Sister   . Heart attack Sister 83       second heart attack at 28  . Heart disease Brother        Before age 61  . Hypertension Brother   . Heart attack Brother 79  . Cancer Maternal Aunt        GYN cancer  . Heart attack Maternal Grandmother   . Alcohol abuse Maternal Grandfather   . Parkinson's disease Sister   . ALS Brother     The patient has one son and two grandsons who are cancer free.  She has two brothers and two sisters.  One brother died of ALS, the other brother had a heart attack at 50, one sister has been diagnosed with parkinson disease, and the other sister has had two heart attacks, one at 33 and one at 22.  Both parents are deceased.  The patient's father died of a heart attack at 90.  His family history is unknown.  The patient's mother was diagnosed and died of ovarian cancer at 36.  She had 10 siblings.  One sister had a GYN cancer.  A couple cousins also had cancer, one with lung and one with liver.  The patient's maternal grandparents are deceased.  The grandmother died of a heart attack and the grandfather died from complications of alcoholism.  Ms. Westman is unaware of previous family history of genetic testing for hereditary cancer risks. Patient's maternal ancestors are of [place] descent, and paternal ancestors are of [place] descent. There is no reported Ashkenazi Jewish ancestry. There is no known consanguinity.  GENETIC TEST RESULTS: Genetic testing reported out on February 19, 2017 through the common hereditary cancer panel found no deleterious mutations.  The Hereditary Gene Panel offered by  [facility] includes sequencing and/or deletion duplication testing of the following 47 genes: APC, ATM, AXIN2, BARD1, BMPR1A, BRCA1, BRCA2, BRIP1, CDH1, CDK4, CDKN2A (p14ARF), CDKN2A (p16INK4a), CHEK2, CTNNA1, DICER1, EPCAM (Deletion/duplication testing only), GREM1 (promoter region deletion/duplication testing only), KIT, MEN1, MLH1, MSH2, MSH3, MSH6, MUTYH, NBN, NF1, NHTL1, PALB2, PDGFRA, PMS2, POLD1, POLE, PTEN, RAD50, RAD51C, RAD51D, SDHB, SDHC, SDHD, SMAD4, SMARCA4. STK11, TP53, TSC1, TSC2, and VHL.  The following genes were evaluated for sequence changes only: SDHA and HOXB13 c.251G>A variant only.   The test report has been scanned into EPIC and is located under the Molecular Pathology section of the Results Review tab.   We discussed with Ms. Mcneil that since the current genetic testing is not perfect, it is possible there may be a gene mutation in one of these genes that current testing cannot detect, but that chance is small. We also discussed, that it is possible that another gene that has not yet been discovered, or that we have not yet tested, is responsible for the cancer diagnoses in the family, and it is, therefore, important to remain in touch with cancer genetics in the future so that we can continue to offer Ms. Severt the most up to date genetic testing.   CANCER SCREENING RECOMMENDATIONS: This result is reassuring and indicates that Ms. Zemaitis likely does not have an increased risk for a future cancer due to a mutation in one of these genes. This normal test also suggests that Ms. Marietta's cancer was most likely not due to an inherited predisposition associated with one of these genes.  Most cancers happen by chance and this negative test suggests that her cancer falls into this category.  We, therefore, recommended she continue to follow the cancer management and screening guidelines provided by her oncology and primary healthcare provider.   An individual's cancer risk and medical  management are not determined by genetic test results alone. Overall cancer risk assessment incorporates additional factors, including personal medical history, family history, and any available genetic information that may result in a personalized plan for cancer prevention and surveillance.  RECOMMENDATIONS FOR FAMILY MEMBERS: Women in this family might be at some increased risk of developing cancer, over the general population risk, simply due to the family history of cancer. We recommended women in this family have a yearly mammogram beginning at age 15, or 65 years younger than the earliest onset of cancer, an annual clinical breast exam, and perform monthly breast self-exams. Women in this family should also have a gynecological exam as recommended by their primary provider. All family members should have a colonoscopy by age 56.  FOLLOW-UP: Lastly, we discussed with Ms. Claudio that cancer genetics is a rapidly advancing field and it is possible that new genetic tests will be appropriate for her and/or  her family members in the future. We encouraged her to remain in contact with cancer genetics on an annual basis so we can update her personal and family histories and let her know of advances in cancer genetics that may benefit this family.   Our contact number was provided. Ms. Underdown's questions were answered to her satisfaction, and she knows she is welcome to call us at anytime with additional questions or concerns.   Roma Kayser, MS, [facility] Certified Genetic Counselor [email or web address]

## 2017-03-11 DIAGNOSIS — J029 Acute pharyngitis, unspecified: Secondary | ICD-10-CM | POA: Diagnosis not present

## 2017-03-11 DIAGNOSIS — J0100 Acute maxillary sinusitis, unspecified: Secondary | ICD-10-CM | POA: Diagnosis not present

## 2017-04-10 ENCOUNTER — Other Ambulatory Visit: Payer: Self-pay | Admitting: Internal Medicine

## 2017-04-22 ENCOUNTER — Ambulatory Visit (INDEPENDENT_AMBULATORY_CARE_PROVIDER_SITE_OTHER): Payer: Medicare Other | Admitting: Internal Medicine

## 2017-04-22 ENCOUNTER — Encounter: Payer: Self-pay | Admitting: Internal Medicine

## 2017-04-22 VITALS — BP 132/84 | HR 79 | Temp 98.3°F | Wt 207.2 lb

## 2017-04-22 DIAGNOSIS — H109 Unspecified conjunctivitis: Secondary | ICD-10-CM | POA: Diagnosis not present

## 2017-04-22 DIAGNOSIS — H9202 Otalgia, left ear: Secondary | ICD-10-CM

## 2017-04-22 DIAGNOSIS — J988 Other specified respiratory disorders: Secondary | ICD-10-CM | POA: Diagnosis not present

## 2017-04-22 DIAGNOSIS — J029 Acute pharyngitis, unspecified: Secondary | ICD-10-CM

## 2017-04-22 MED ORDER — AMOXICILLIN-POT CLAVULANATE 875-125 MG PO TABS: 1.0000 | ORAL_TABLET | Freq: Two times a day (BID) | ORAL | 0 refills | Status: DC

## 2017-04-22 NOTE — Patient Instructions (Signed)
This still could resolved on its own  But   If eye and  Ear and  Throat do not imp,rove in the nexgt  2 days  Then add the antibiotic  . Contact us if worse or  Fever again or  persistent or progressive   Sx .

## 2017-04-22 NOTE — Progress Notes (Signed)
Chief Complaint  Patient presents with  . Cough    Pt c/o cough, sore throat and ear ache x 1 week. Pt reports having one fever about 1 week ago. Coughing up green mucus and blowing green from nose as well. Pt having eye matting in her left eye.     HPI: Chelsea Moreno 67 y.o.  SDA PCP NA  See above   Onset with fever and sore throat   A week ago  And now  As above.  Continued st   Worse overnight.    Took antibiotic and got better and   [facility]  [place] [facility]   And  Spasm in throat  Prednisone and   ?  Other antibiotic  Neg strep test     Then cough went away    And 1 week later this began   Left  Ear  Bothering her.   Not too bac but left eye crusting in am and this am closed shut no  Fever   Vision change   Has glasses  ROS: See pertinent positives and negatives per HPI. No cp sob still tired and underthe weather  No known exposures  Otherwise   Past Medical History:  Diagnosis Date  . Breast cancer ([place]) 11/05/12   left  . Bursitis   . COLONIC POLYPS, HX OF 09/24/2008  . DEPRESSION 09/23/2007   takes Lexapro daily but hasn't taken since Sept 2014  . Diabetes insipidus ([place])    takes Desmopressin bid  . Diarrhea   . Dizziness    when nauseated gets dizzy  . Family history of ovarian cancer   . Hemorrhoids   . History of bronchitis    yrs ago  . History of migraine    last time many yrs ago  . Hx of radiation therapy 01/29/13- 03/16/13   left breast 4500 cGy 25 sessions, left breast boost 1000 cGy 5 sessions  . HYPERLIPIDEMIA 09/06/2006   takes Atorvasatin nightly  . HYPERTENSION 09/06/2006   takes Lisinopril daily  . Muscle spasms of head and/or neck   . OSTEOPOROSIS 09/24/2008   takes Actonel every 30days  . Vitamin D deficiency    takes OTC Vit D    Family History  Problem Relation Age of Onset  . Ovarian cancer Mother   . Cancer Mother        Ovarian  . Heart disease Mother        Before age 67  . Heart attack Mother   . Varicose Veins Mother    . Heart disease Father        Before age 64  . Hyperlipidemia Father   . Hypertension Father   . Heart attack Father   . Heart disease Sister        Before age 11  . Hypertension Sister   . Heart attack Sister 53       second heart attack at 69  . Heart disease Brother        Before age 77  . Hypertension Brother   . Heart attack Brother 41  . Cancer Maternal Aunt        GYN cancer  . Heart attack Maternal Grandmother   . Alcohol abuse Maternal Grandfather   . Parkinson's disease Sister   . ALS Brother     Social History   Socioeconomic History  . Marital status: Widowed    Spouse name: Not on file  . Number  of children: Not on file  . Years of education: Not on file  . Highest education level: Not on file  Occupational History  . Occupation: Professor at [employer]  . Financial resource strain: Not on file  . Food insecurity:    Worry: Not on file    Inability: Not on file  . Transportation needs:    Medical: Not on file    Non-medical: Not on file  Tobacco Use  . Smoking status: Former Smoker    Packs/day: 0.50    Years: 4.00    Pack years: 2.00    Types: Cigarettes    Last attempt to quit: 01/07/1973    Years since quitting: 44.3  . Smokeless tobacco: Never Used  . Tobacco comment: quit 51yrs ago  Substance and Sexual Activity  . Alcohol use: Yes    Alcohol/week: 0.0 oz    Comment: socailly  . Drug use: No  . Sexual activity: Not Currently    Birth control/protection: Post-menopausal    Comment: menarche age 89, first live birth age 74, menopause 2002, no HRT  Lifestyle  . Physical activity:    Days per week: Not on file    Minutes per session: Not on file  . Stress: Not on file  Relationships  . Social connections:    Talks on phone: Not on file    Gets together: Not on file    Attends religious service: Not on file    Active member of club or organization: Not on file    Attends meetings of clubs or organizations: Not on file     Relationship status: Not on file  Other Topics Concern  . Not on file  Social History Narrative  . Not on file    Outpatient Medications Prior to Visit  Medication Sig Dispense Refill  . atorvastatin (LIPITOR) 80 MG tablet TAKE ONE TABLET BY MOUTH DAILY 90 tablet 0  . desmopressin (DDAVP NASAL) 0.01 % solution PLACE 1 SPRAY INTO EACH NOSTRIL 2 TIMES A DAY 10 mL 4  . escitalopram (LEXAPRO) 10 MG tablet TAKE 1 TABLET BY MOUTH DAILY 90 tablet 0  . latanoprost (XALATAN) 0.005 % ophthalmic solution Place 1 drop into both eyes at bedtime.    Marland Kitchen lisinopril (PRINIVIL,ZESTRIL) 20 MG tablet TAKE 1 TABLET (20 MG TOTAL) BY MOUTH DAILY. 90 tablet 1  . potassium chloride SA (K-DUR,KLOR-CON) 20 MEQ tablet Take 1 tablet (20 mEq total) by mouth 2 (two) times daily. 90 tablet 6  . tamoxifen (NOLVADEX) 20 MG tablet Take 1 tablet (20 mg total) by mouth daily. 90 tablet 3  . timolol (BETIMOL) 0.5 % ophthalmic solution Place 1 drop into both eyes daily.     No facility-administered medications prior to visit.      EXAM:  BP 132/84 (BP Location: Right Arm, Patient Position: Sitting, Cuff Size: Large)   Pulse 79   Temp 98.3 F (36.8 C) (Oral)   Wt 207 lb 3.2 oz (94 kg)   BMI 37.90 kg/m   Body mass index is 37.9 kg/m.  GENERAL: vitals reviewed and listed above, alert, oriented, appears well hydrated and in no acute distress HEENT: atraumatic, conjunctiva  clear, no obvious abnormalities on inspection of external nose and ears left eye 1+ injection medically with mild redness no dc   Tm right nl left partly occluded cerumen but grey    And transucent.  OP : no lesion edema or exudate  2+ post pharyngeal redness no lesion  NECK: no obvious masses on inspection palpation  LUNGS: clear to auscultation bilaterally, no wheezes, rales or rhonchi, good air movement CV: HRRR, no clubbing cyanosis or  peripheral edema nl cap refill  MS: moves all extremities without noticeable focal  abnormality PSYCH: pleasant  and cooperative, no obvious depression or anxiety  ASSESSMENT AND PLAN:  Discussed the following assessment and plan:  Pharyngitis, unspecified etiology  Conjunctivitis of left eye, unspecified conjunctivitis type  Earache symptoms in left ear  Respiratory infection atypical recurrent? Sx vs new   Left predominant reminiscent of poss early sinusitis    If  persistent or progressive begin antibiotic    8 days into illness  And  Ongoing  -Patient advised to return or notify health care team  if symptoms worsen ,persist or new concerns arise.  Patient Instructions  This still could resolved on its own  But   If eye and  Ear and  Throat do not imp,rove in the nexgt  2 days  Then add the antibiotic  . Contact us if worse or  Fever again or  persistent or progressive   Sx .      Standley Brooking. Panosh M.D.

## 2017-06-11 DIAGNOSIS — H401131 Primary open-angle glaucoma, bilateral, mild stage: Secondary | ICD-10-CM | POA: Diagnosis not present

## 2017-06-11 DIAGNOSIS — H52203 Unspecified astigmatism, bilateral: Secondary | ICD-10-CM | POA: Diagnosis not present

## 2017-06-11 DIAGNOSIS — H2513 Age-related nuclear cataract, bilateral: Secondary | ICD-10-CM | POA: Diagnosis not present

## 2017-07-02 ENCOUNTER — Telehealth: Payer: Self-pay | Admitting: Internal Medicine

## 2017-07-02 NOTE — Telephone Encounter (Signed)
Copied from [place]. Topic: Quick Communication - Rx Refill/Question >> Jul 02, 2017  3:02 PM Waldemar Dickens, Sade R wrote: Medication:escitalopram (LEXAPRO) 10 MG tablet  Has the patient contacted their pharmacy? Yes (Agent: If no, request that the patient contact the pharmacy for the refill.) (Agent: If yes, when and what did the pharmacy advise?)  Preferred Pharmacy (with phone number or street name):[facility], [place] [phone] (Phone) [phone] (Fax)      Agent: Please be advised that RX refills may take up to 3 business days. We ask that you follow-up with your pharmacy.

## 2017-07-03 MED ORDER — ESCITALOPRAM OXALATE 10 MG PO TABS: 10.0000 mg | ORAL_TABLET | Freq: Every day | ORAL | 0 refills | Status: DC

## 2017-07-17 ENCOUNTER — Encounter: Payer: Self-pay | Admitting: Internal Medicine

## 2017-07-17 ENCOUNTER — Ambulatory Visit (INDEPENDENT_AMBULATORY_CARE_PROVIDER_SITE_OTHER): Payer: Medicare Other | Admitting: Internal Medicine

## 2017-07-17 VITALS — BP 120/60 | HR 66 | Temp 98.1°F | Ht 62.5 in | Wt 212.6 lb

## 2017-07-17 DIAGNOSIS — G4733 Obstructive sleep apnea (adult) (pediatric): Secondary | ICD-10-CM

## 2017-07-17 DIAGNOSIS — E232 Diabetes insipidus: Secondary | ICD-10-CM | POA: Diagnosis not present

## 2017-07-17 DIAGNOSIS — Z853 Personal history of malignant neoplasm of breast: Secondary | ICD-10-CM

## 2017-07-17 DIAGNOSIS — Z0000 Encounter for general adult medical examination without abnormal findings: Secondary | ICD-10-CM

## 2017-07-17 DIAGNOSIS — E785 Hyperlipidemia, unspecified: Secondary | ICD-10-CM

## 2017-07-17 DIAGNOSIS — R7302 Impaired glucose tolerance (oral): Secondary | ICD-10-CM

## 2017-07-17 DIAGNOSIS — I10 Essential (primary) hypertension: Secondary | ICD-10-CM

## 2017-07-17 DIAGNOSIS — Z23 Encounter for immunization: Secondary | ICD-10-CM

## 2017-07-17 DIAGNOSIS — C50312 Malignant neoplasm of lower-inner quadrant of left female breast: Secondary | ICD-10-CM

## 2017-07-17 LAB — COMPREHENSIVE METABOLIC PANEL
ALK PHOS: 63 U/L (ref 39–117)
ALT: 36 U/L — AB (ref 0–35)
AST: 41 U/L — AB (ref 0–37)
Albumin: 4.2 g/dL (ref 3.5–5.2)
BILIRUBIN TOTAL: 0.7 mg/dL (ref 0.2–1.2)
BUN: 6 mg/dL (ref 6–23)
CO2: 28 meq/L (ref 19–32)
CREATININE: 0.47 mg/dL (ref 0.40–1.20)
Calcium: 8.9 mg/dL (ref 8.4–10.5)
Chloride: 90 mEq/L — ABNORMAL LOW (ref 96–112)
GFR: 140.63 mL/min (ref >60.00)
GLUCOSE: 102 mg/dL — AB (ref 70–99)
Potassium: 3.4 mEq/L — ABNORMAL LOW (ref 3.5–5.1)
Sodium: 128 mEq/L — ABNORMAL LOW (ref 135–145)
TOTAL PROTEIN: 6.6 g/dL (ref 6.0–8.3)

## 2017-07-17 LAB — TSH: TSH: 1.54 u[IU]/mL (ref 0.35–4.50)

## 2017-07-17 LAB — LIPID PANEL
Cholesterol: 140 mg/dL (ref 0–200)
HDL: 37.9 mg/dL — ABNORMAL LOW (ref >39.00)
LDL Cholesterol: 79 mg/dL (ref 0–99)
NONHDL: 102.45
TRIGLYCERIDES: 118 mg/dL (ref 0.0–149.0)
Total CHOL/HDL Ratio: 4
VLDL: 23.6 mg/dL (ref 0.0–40.0)

## 2017-07-17 LAB — HEMOGLOBIN A1C: Hgb A1c MFr Bld: 6.1 % (ref 4.6–6.5)

## 2017-07-17 LAB — CBC WITH DIFFERENTIAL/PLATELET
BASOS ABS: 0 10*3/uL (ref 0.0–0.1)
BASOS PCT: 1.2 % (ref 0.0–3.0)
EOS ABS: 0.1 10*3/uL (ref 0.0–0.7)
Eosinophils Relative: 3.2 % (ref 0.0–5.0)
HCT: 36.4 % (ref 36.0–46.0)
Hemoglobin: 12.9 g/dL (ref 12.0–15.0)
LYMPHS ABS: 0.9 10*3/uL (ref 0.7–4.0)
Lymphocytes Relative: 22.3 % (ref 12.0–46.0)
MCHC: 35.5 g/dL (ref 30.0–36.0)
MCV: 88.3 fl (ref 78.0–100.0)
MONO ABS: 0.4 10*3/uL (ref 0.1–1.0)
Monocytes Relative: 10 % (ref 3.0–12.0)
NEUTROS ABS: 2.6 10*3/uL (ref 1.4–7.7)
NEUTROS PCT: 63.3 % (ref 43.0–77.0)
Platelets: 220 10*3/uL (ref 150.0–400.0)
RBC: 4.12 Mil/uL (ref 3.87–5.11)
RDW: 13.2 % (ref 11.5–15.5)
WBC: 4.1 10*3/uL (ref 4.0–10.5)

## 2017-07-17 MED ORDER — LISINOPRIL 20 MG PO TABS: 20.0000 mg | ORAL_TABLET | Freq: Every day | ORAL | 1 refills | Status: DC

## 2017-07-17 MED ORDER — ATORVASTATIN CALCIUM 80 MG PO TABS: 80.0000 mg | ORAL_TABLET | Freq: Every day | ORAL | 0 refills | Status: DC

## 2017-07-17 MED ORDER — DESMOPRESSIN ACETATE SPRAY 0.01 % NA SOLN: NASAL | 4 refills | Status: DC

## 2017-07-17 MED ORDER — ESCITALOPRAM OXALATE 10 MG PO TABS: 10.0000 mg | ORAL_TABLET | Freq: Every day | ORAL | 0 refills | Status: DC

## 2017-07-17 NOTE — Progress Notes (Signed)
Subjective:    Patient ID: Chelsea Moreno, female    DOB: [birth year], 67 y.o.   MRN: [id number]  HPI  67 year old patient who is seen today for annual follow-up and for a subsequent Medicare wellness visit  She has a history of breast cancer and is followed by oncology.  She also has a family history of ovarian cancer (mother) and genetic testing has been negative.  She is seen annually by gynecology.  She did have follow-up colonoscopy in 2015 and 10-year intervals suggested.  She did have a small single polyp in 2009 She has a history of diabetes insipidus She is followed by pulmonary medicine with a history of OSA.  She has been treated with lisinopril for essential hypertension.  Social history.  Widowed.  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 Breast cancer ([place]) 11/05/12   left  . Bursitis   . COLONIC POLYPS, HX OF 09/24/2008  . DEPRESSION 09/23/2007   takes Lexapro daily but hasn't taken since Sept 2014  . Diabetes insipidus ([place])    takes Desmopressin bid  . Diarrhea   . Dizziness    when nauseated gets dizzy  . Family history of ovarian cancer   . Hemorrhoids   . History of bronchitis    yrs ago  . History of migraine    last time many yrs ago  . Hx of radiation therapy 01/29/13- 03/16/13   left breast 4500 cGy 25 sessions, left breast boost 1000 cGy 5 sessions  . HYPERLIPIDEMIA 09/06/2006   takes Atorvasatin nightly  . HYPERTENSION 09/06/2006   takes Lisinopril daily  . Muscle spasms of head and/or neck   . OSTEOPOROSIS 09/24/2008   takes Actonel every 30days  . Vitamin D deficiency    takes OTC Vit D     Social History   Socioeconomic History  . Marital status: Widowed    Spouse name: Not on file  . Number of children: Not on file  . Years of education: Not on file  . Highest education level: Not on file  Occupational History  . Occupation: Professor at [employer]  . Financial resource strain: Not on file  . Food insecurity:    Worry: Not on file    Inability: Not on file  . Transportation needs:    Medical: Not on file    Non-medical: Not on file  Tobacco Use  . Smoking status: Former Smoker    Packs/day: 0.50    Years: 4.00    Pack years: 2.00    Types: Cigarettes    Last attempt to quit: 01/07/1973    Years since quitting: 44.5  . Smokeless tobacco: Never Used  . Tobacco comment: quit 6yrs ago  Substance and Sexual Activity  . Alcohol use: Yes    Alcohol/week: 0.0 oz    Comment: socailly  . Drug use: No  . Sexual activity: Not Currently    Birth control/protection: Post-menopausal    Comment: menarche age 63, first live birth age 54, menopause 2002, no HRT  Lifestyle  . Physical activity:    Days per week: Not on file    Minutes per session: Not on file  . Stress: Not on file  Relationships  . Social connections:    Talks on phone: Not on file    Gets together: Not on file    Attends religious service: Not on file  Active member of club or organization: Not on file    Attends meetings of clubs or organizations: Not on file    Relationship status: Not on file  . Intimate partner violence:    Fear of current or ex partner: Not on file    Emotionally abused: Not on file    Physically abused: Not on file    Forced sexual activity: Not on file  Other Topics Concern  . Not on file  Social History Narrative  . Not on file    Past Surgical History:  Procedure Laterality Date  . BREAST BIOPSY Left 91yrs ago  . BREAST LUMPECTOMY WITH NEEDLE LOCALIZATION Left 12/16/2012   Procedure: PARTIAL MASTECTOMY WITH NEEDLE LOCALIZATION;  Surgeon: Stark Klein, MD;  Location: [facility];  Service: General;  Laterality: Left;  1:00 NL at [place]   . COLONOSCOPY      Family History  Problem Relation Age of Onset  . Ovarian cancer Mother   . Cancer Mother        Ovarian  . Heart disease Mother        Before age 38  . Heart  attack Mother   . Varicose Veins Mother   . Heart disease Father        Before age 64  . Hyperlipidemia Father   . Hypertension Father   . Heart attack Father   . Heart disease Sister        Before age 79  . Hypertension Sister   . Heart attack Sister 8       second heart attack at 62  . Heart disease Brother        Before age 21  . Hypertension Brother   . Heart attack Brother 79  . Cancer Maternal Aunt        GYN cancer  . Heart attack Maternal Grandmother   . Alcohol abuse Maternal Grandfather   . Parkinson's disease Sister   . ALS Brother     No Known Allergies  Current Outpatient Medications on File Prior to Visit  Medication Sig Dispense Refill  . amoxicillin-clavulanate (AUGMENTIN) 875-125 MG tablet Take 1 tablet by mouth every 12 (twelve) hours. 14 tablet 0  . atorvastatin (LIPITOR) 80 MG tablet TAKE ONE TABLET BY MOUTH DAILY 90 tablet 0  . desmopressin (DDAVP NASAL) 0.01 % solution PLACE 1 SPRAY INTO EACH NOSTRIL 2 TIMES A DAY 10 mL 4  . escitalopram (LEXAPRO) 10 MG tablet Take 1 tablet (10 mg total) by mouth daily. 90 tablet 0  . latanoprost (XALATAN) 0.005 % ophthalmic solution Place 1 drop into both eyes at bedtime.    Marland Kitchen lisinopril (PRINIVIL,ZESTRIL) 20 MG tablet TAKE 1 TABLET (20 MG TOTAL) BY MOUTH DAILY. 90 tablet 1  . potassium chloride SA (K-DUR,KLOR-CON) 20 MEQ tablet Take 1 tablet (20 mEq total) by mouth 2 (two) times daily. 90 tablet 6  . tamoxifen (NOLVADEX) 20 MG tablet Take 1 tablet (20 mg total) by mouth daily. 90 tablet 3  . timolol (BETIMOL) 0.5 % ophthalmic solution Place 1 drop into both eyes daily.     No current facility-administered medications on file prior to visit.     BP 120/60 (BP Location: Right Wrist, Patient Position: Sitting, Cuff Size: Normal)   Pulse 66   Temp 98.1 F (36.7 C) (Oral)   Ht 5' 2.5" (1.588 m)   Wt 212 lb 9.6 oz (96.4 kg)   SpO2 97%   BMI 38.27 kg/m   Subsequent Medicare wellness  visit  1. Risk factors, based  on past  M,S,F history cardiovascular risk factors include hypertension only  2.  Physical activities: Walks daily but no vigorous exercise regimen.  He is bothered by occasional right ankle pain  3.  Depression/mood: No history of major depression or mood disorder.  Does have a history of adjustment disorder with depressed mood.  Remains on Lexapro  4.  Hearing: No deficits  5.  ADL's: Independent  6.  Fall risk: Low.  Patient has had one fall over the past year but this occurred with icy conditions   7.  Home safety: No problems identified  8.  Height weight, and visual acuity; height and weight stable no change in visual acuity  9.  Counseling: Heart healthy diet recommended.  More vigorous exercise and modest weight loss encouraged  10. Lab orders based on risk factors: Laboratory update will be reviewed  11. Referral : Follow-up oncology and OB/GYN  12. Care plan: Continue efforts at aggressive risk factor modification alert and appropriate normal affect.  No cognitive dysfunction  13. Cognitive assessment: Primary care ophthalmology GI  14. Screening: Patient provided with a written and personalized 5-10 year screening schedule in the AVS.    15. Provider List Update: OB/GYN and oncology primary care GI ophthalmology   Review of Systems  Constitutional: Negative.   HENT: Negative for congestion, dental problem, hearing loss, rhinorrhea, sinus pressure, sore throat and tinnitus.   Eyes: Negative for pain, discharge and visual disturbance.  Respiratory: Negative for cough and shortness of breath.   Cardiovascular: Negative for chest pain, palpitations and leg swelling.  Gastrointestinal: Negative for abdominal distention, abdominal pain, blood in stool, constipation, diarrhea, nausea and vomiting.  Genitourinary: Negative for difficulty urinating, dysuria, flank pain, frequency, hematuria, pelvic pain, urgency, vaginal bleeding, vaginal discharge and vaginal pain.    Musculoskeletal: Negative for arthralgias, gait problem and joint swelling.       Occasional right ankle pain  Skin: Negative for rash.  Neurological: Negative for dizziness, syncope, speech difficulty, weakness, numbness and headaches.  Hematological: Negative for adenopathy.  Psychiatric/Behavioral: Negative for agitation, behavioral problems and dysphoric mood. The patient is not nervous/anxious.        Objective:   Physical Exam  Constitutional: She is oriented to person, place, and time. She appears well-developed and well-nourished.  Weight 212 Blood pressure 130/76    HENT:  Head: Normocephalic and atraumatic.  Right Ear: External ear normal.  Left Ear: External ear normal.  Mouth/Throat: Oropharynx is clear and moist.  Eyes: Conjunctivae and EOM are normal.  Neck: Normal range of motion. Neck supple. No JVD present. No thyromegaly present.  Cardiovascular: Normal rate, regular rhythm, normal heart sounds and intact distal pulses.  No murmur heard. Pulmonary/Chest: Effort normal and breath sounds normal. She has no wheezes. She has no rales.  Abdominal: Soft. Bowel sounds are normal. She exhibits no distension and no mass. There is no tenderness. There is no rebound and no guarding.  Genitourinary: Vagina normal.  Musculoskeletal: Normal range of motion. She exhibits no edema or tenderness.  Neurological: She is alert and oriented to person, place, and time. She has normal reflexes. She displays normal reflexes. No cranial nerve deficit. She exhibits normal muscle tone. Coordination normal.  Skin: Skin is warm and dry. No rash noted.  Psychiatric: She has a normal mood and affect. Her behavior is normal.          Assessment & Plan:   Subsequent Medicare wellness visit  Essential hypertension stable Diabetes insipidus.  We will continue desmopressin.  Check electrolytes Impaired glucose tolerance.  Check hemoglobin A1c History of breast cancer.  Follow-up oncology  and gynecology Dyslipidemia continue statin therapy.  Check lipid profile  Patient will follow-up in 1 year with a new provider  medicines updated  Marletta Lor

## 2017-07-17 NOTE — Patient Instructions (Addendum)
Limit your sodium (Salt) intake  Please check your blood pressure on a regular basis.  If it is consistently greater than 150/90, please make an office appointment.    It is important that you exercise regularly, at least 20 minutes 3 to 4 times per week.  If you develop chest pain or shortness of breath seek  medical attention.  You need to lose weight.  Consider a lower calorie diet and regular exercise.  Return in one year for follow-up    Health Maintenance for Postmenopausal Women Menopause is a normal process in which your reproductive ability comes to an end. This process happens gradually over a span of months to years, usually between the ages of 28 and 23. Menopause is complete when you have missed 12 consecutive menstrual periods. It is important to talk with your health care provider about some of the most common conditions that affect postmenopausal women, such as heart disease, cancer, and bone loss (osteoporosis). Adopting a healthy lifestyle and getting preventive care can help to promote your health and wellness. Those actions can also lower your chances of developing some of these common conditions. What should I know about menopause? During menopause, you may experience a number of symptoms, such as:  Moderate-to-severe hot flashes.  Night sweats.  Decrease in sex drive.  Mood swings.  Headaches.  Tiredness.  Irritability.  Memory problems.  Insomnia.  Choosing to treat or not to treat menopausal changes is an individual decision that you make with your health care provider. What should I know about hormone replacement therapy and supplements? Hormone therapy products are effective for treating symptoms that are associated with menopause, such as hot flashes and night sweats. Hormone replacement carries certain risks, especially as you become older. If you are thinking about using estrogen or estrogen with progestin treatments, discuss the benefits and risks  with your health care provider. What should I know about heart disease and stroke? Heart disease, heart attack, and stroke become more likely as you age. This may be due, in part, to the hormonal changes that your body experiences during menopause. These can affect how your body processes dietary fats, triglycerides, and cholesterol. Heart attack and stroke are both medical emergencies. There are many things that you can do to help prevent heart disease and stroke:  Have your blood pressure checked at least every 1-2 years. High blood pressure causes heart disease and increases the risk of stroke.  If you are 42-75 years old, ask your health care provider if you should take aspirin to prevent a heart attack or a stroke.  Do not use any tobacco products, including cigarettes, chewing tobacco, or electronic cigarettes. If you need help quitting, ask your health care provider.  It is important to eat a healthy diet and maintain a healthy weight. ? Be sure to include plenty of vegetables, fruits, low-fat dairy products, and lean protein. ? Avoid eating foods that are high in solid fats, added sugars, or salt (sodium).  Get regular exercise. This is one of the most important things that you can do for your health. ? Try to exercise for at least 150 minutes each week. The type of exercise that you do should increase your heart rate and make you sweat. This is known as moderate-intensity exercise. ? Try to do strengthening exercises at least twice each week. Do these in addition to the moderate-intensity exercise.  Know your numbers.Ask your health care provider to check your cholesterol and your blood glucose. Continue  to have your blood tested as directed by your health care provider.  What should I know about cancer screening? There are several types of cancer. Take the following steps to reduce your risk and to catch any cancer development as early as possible. Breast Cancer  Practice breast  self-awareness. ? This means understanding how your breasts normally appear and feel. ? It also means doing regular breast self-exams. Let your health care provider know about any changes, no matter how small.  If you are 97 or older, have a clinician do a breast exam (clinical breast exam or CBE) every year. Depending on your age, family history, and medical history, it may be recommended that you also have a yearly breast X-ray (mammogram).  If you have a family history of breast cancer, talk with your health care provider about genetic screening.  If you are at high risk for breast cancer, talk with your health care provider about having an MRI and a mammogram every year.  Breast cancer (BRCA) gene test is recommended for women who have family members with BRCA-related cancers. Results of the assessment will determine the need for genetic counseling and BRCA1 and for BRCA2 testing. BRCA-related cancers include these types: ? Breast. This occurs in males or females. ? Ovarian. ? Tubal. This may also be called fallopian tube cancer. ? Cancer of the abdominal or pelvic lining (peritoneal cancer). ? Prostate. ? Pancreatic.  Cervical, Uterine, and Ovarian Cancer Your health care provider may recommend that you be screened regularly for cancer of the pelvic organs. These include your ovaries, uterus, and vagina. This screening involves a pelvic exam, which includes checking for microscopic changes to the surface of your cervix (Pap test).  For women ages 21-65, health care providers may recommend a pelvic exam and a Pap test every three years. For women ages 59-65, they may recommend the Pap test and pelvic exam, combined with testing for human papilloma virus (HPV), every five years. Some types of HPV increase your risk of cervical cancer. Testing for HPV may also be done on women of any age who have unclear Pap test results.  Other health care providers may not recommend any screening for  nonpregnant women who are considered low risk for pelvic cancer and have no symptoms. Ask your health care provider if a screening pelvic exam is right for you.  If you have had past treatment for cervical cancer or a condition that could lead to cancer, you need Pap tests and screening for cancer for at least 20 years after your treatment. If Pap tests have been discontinued for you, your risk factors (such as having a new sexual partner) need to be reassessed to determine if you should start having screenings again. Some women have medical problems that increase the chance of getting cervical cancer. In these cases, your health care provider may recommend that you have screening and Pap tests more often.  If you have a family history of uterine cancer or ovarian cancer, talk with your health care provider about genetic screening.  If you have vaginal bleeding after reaching menopause, tell your health care provider.  There are currently no reliable tests available to screen for ovarian cancer.  Lung Cancer Lung cancer screening is recommended for adults 78-74 years old who are at high risk for lung cancer because of a history of smoking. A yearly low-dose CT scan of the lungs is recommended if you:  Currently smoke.  Have a history of at least 30  pack-years of smoking and you currently smoke or have quit within the past 15 years. A pack-year is smoking an average of one pack of cigarettes per day for one year.  Yearly screening should:  Continue until it has been 15 years since you quit.  Stop if you develop a health problem that would prevent you from having lung cancer treatment.  Colorectal Cancer  This type of cancer can be detected and can often be prevented.  Routine colorectal cancer screening usually begins at age 21 and continues through age 106.  If you have risk factors for colon cancer, your health care provider may recommend that you be screened at an earlier age.  If you  have a family history of colorectal cancer, talk with your health care provider about genetic screening.  Your health care provider may also recommend using home test kits to check for hidden blood in your stool.  A small camera at the end of a tube can be used to examine your colon directly (sigmoidoscopy or colonoscopy). This is done to check for the earliest forms of colorectal cancer.  Direct examination of the colon should be repeated every 5-10 years until age 91. However, if early forms of precancerous polyps or small growths are found or if you have a family history or genetic risk for colorectal cancer, you may need to be screened more often.  Skin Cancer  Check your skin from head to toe regularly.  Monitor any moles. Be sure to tell your health care provider: ? About any new moles or changes in moles, especially if there is a change in a mole's shape or color. ? If you have a mole that is larger than the size of a pencil eraser.  If any of your family members has a history of skin cancer, especially at a young age, talk with your health care provider about genetic screening.  Always use sunscreen. Apply sunscreen liberally and repeatedly throughout the day.  Whenever you are outside, protect yourself by wearing long sleeves, pants, a wide-brimmed hat, and sunglasses.  What should I know about osteoporosis? Osteoporosis is a condition in which bone destruction happens more quickly than new bone creation. After menopause, you may be at an increased risk for osteoporosis. To help prevent osteoporosis or the bone fractures that can happen because of osteoporosis, the following is recommended:  If you are 60-69 years old, get at least 1,000 mg of calcium and at least 600 mg of vitamin D per day.  If you are older than age 81 but younger than age 62, get at least 1,200 mg of calcium and at least 600 mg of vitamin D per day.  If you are older than age 69, get at least 1,200 mg of  calcium and at least 800 mg of vitamin D per day.  Smoking and excessive alcohol intake increase the risk of osteoporosis. Eat foods that are rich in calcium and vitamin D, and do weight-bearing exercises several times each week as directed by your health care provider. What should I know about how menopause affects my mental health? Depression may occur at any age, but it is more common as you become older. Common symptoms of depression include:  Low or sad mood.  Changes in sleep patterns.  Changes in appetite or eating patterns.  Feeling an overall lack of motivation or enjoyment of activities that you previously enjoyed.  Frequent crying spells.  Talk with your health care provider if you think that  you are experiencing depression. What should I know about immunizations? It is important that you get and maintain your immunizations. These include:  Tetanus, diphtheria, and pertussis (Tdap) booster vaccine.  Influenza every year before the flu season begins.  Pneumonia vaccine.  Shingles vaccine.  Your health care provider may also recommend other immunizations. This information is not intended to replace advice given to you by your health care provider. Make sure you discuss any questions you have with your health care provider. Document Released: 02/16/2005 Document Revised: 07/15/2015 Document Reviewed: 09/28/2014 Elsevier Interactive Patient Education  2018 [employer].

## 2017-07-18 LAB — HEPATITIS C ANTIBODY
Hepatitis C Ab: NONREACTIVE
SIGNAL TO CUT-OFF: 0.01 (ref <1.00)

## 2017-08-09 ENCOUNTER — Other Ambulatory Visit: Payer: Self-pay

## 2017-08-11 NOTE — Assessment & Plan Note (Signed)
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   Tamoxifen toxicities: Patient is now tolerating tamoxifen extremely well without any major problems or concerns.  Breast Cancer Surveillance: 1. Breast exam 08/12/17: Normal 2. Mammogram 11/01/2016 at [facility] No abnormalities. Postsurgical changes. Breast Density Category A.  Return to clinic in 1 year for follow-up

## 2017-08-12 ENCOUNTER — Telehealth: Payer: Self-pay | Admitting: Hematology and Oncology

## 2017-08-12 ENCOUNTER — Inpatient Hospital Stay: Payer: Medicare Other | Attending: Hematology and Oncology | Admitting: Hematology and Oncology

## 2017-08-12 DIAGNOSIS — Z170 Estrogen receptor positive status [ER+]: Secondary | ICD-10-CM | POA: Insufficient documentation

## 2017-08-12 DIAGNOSIS — D0512 Intraductal carcinoma in situ of left breast: Secondary | ICD-10-CM | POA: Diagnosis not present

## 2017-08-12 DIAGNOSIS — Z923 Personal history of irradiation: Secondary | ICD-10-CM | POA: Diagnosis not present

## 2017-08-12 DIAGNOSIS — C50312 Malignant neoplasm of lower-inner quadrant of left female breast: Secondary | ICD-10-CM

## 2017-08-12 MED ORDER — TAMOXIFEN CITRATE 20 MG PO TABS: 20.0000 mg | ORAL_TABLET | Freq: Every day | ORAL | 1 refills | Status: DC

## 2017-08-12 NOTE — Telephone Encounter (Signed)
Gave patient avs and calendar of upcoming aug appts.  °

## 2017-08-12 NOTE — Progress Notes (Signed)
Patient Care Team: Marletta Lor, MD as PCP - General  DIAGNOSIS:  Encounter Diagnosis  Name Primary?  . Malignant neoplasm of lower-inner quadrant of left breast in female, estrogen receptor positive ([place])     SUMMARY OF ONCOLOGIC HISTORY:   Cancer of lower-inner quadrant of left female breast ([place])   11/05/2012 Initial Diagnosis    DCIS with calcifications and necrosis ER 100% PR 100%      12/26/2012 Surgery    Left partial mastectomy: DCIS: Reexcision margins benign      01/30/2013 - 03/16/2013 Radiation Therapy    Radiation therapy adjuvant      03/17/2013 -  Anti-estrogen oral therapy    Adjuvant tamoxifen 20 mg daily plan is for 5 years      02/19/2017 Genetic Testing    Negative genetic testing on the common hereditary cancer panel.  The Hereditary Gene Panel offered by [facility] includes sequencing and/or deletion duplication testing of the following 47 genes: APC, ATM, AXIN2, BARD1, BMPR1A, BRCA1, BRCA2, BRIP1, CDH1, CDK4, CDKN2A (p14ARF), CDKN2A (p16INK4a), CHEK2, CTNNA1, DICER1, EPCAM (Deletion/duplication testing only), GREM1 (promoter region deletion/duplication testing only), KIT, MEN1, MLH1, MSH2, MSH3, MSH6, MUTYH, NBN, NF1, NHTL1, PALB2, PDGFRA, PMS2, POLD1, POLE, PTEN, RAD50, RAD51C, RAD51D, SDHB, SDHC, SDHD, SMAD4, SMARCA4. STK11, TP53, TSC1, TSC2, and VHL.  The following genes were evaluated for sequence changes only: SDHA and HOXB13 c.251G>A variant only. The report date is February 19, 2017.       CHIEF COMPLIANT: Follow-up on tamoxifen therapy for DCIS  INTERVAL HISTORY: Chelsea Moreno is a 67 year old with above-mentioned history of left breast DCIS who underwent lumpectomy and radiation is currently on tamoxifen since 2015.  She appears to be tolerating it extremely well.  She has no side effects whatsoever.  She denies any lumps or nodules in the breast.  Mammograms are done in October.  REVIEW OF SYSTEMS:   Constitutional: Denies fevers, chills or  abnormal weight loss Eyes: Denies blurriness of vision Ears, nose, mouth, throat, and face: Denies mucositis or sore throat Respiratory: Denies cough, dyspnea or wheezes Cardiovascular: Denies palpitation, chest discomfort Gastrointestinal:  Denies nausea, heartburn or change in bowel habits Skin: Denies abnormal skin rashes Lymphatics: Denies new lymphadenopathy or easy bruising Neurological:Denies numbness, tingling or new weaknesses Behavioral/Psych: Mood is stable, no new changes  Extremities: No lower extremity edema Breast:  denies any pain or lumps or nodules in either breasts All other systems were reviewed with the patient and are negative.  I have reviewed the past medical history, past surgical history, social history and family history with the patient and they are unchanged from previous note.  ALLERGIES:  has No Known Allergies.  MEDICATIONS:  Current Outpatient Medications  Medication Sig Dispense Refill  . amoxicillin-clavulanate (AUGMENTIN) 875-125 MG tablet Take 1 tablet by mouth every 12 (twelve) hours. 14 tablet 0  . atorvastatin (LIPITOR) 80 MG tablet Take 1 tablet (80 mg total) by mouth daily. 90 tablet 0  . desmopressin (DDAVP NASAL) 0.01 % solution PLACE 1 SPRAY INTO EACH NOSTRIL 2 TIMES A DAY 10 mL 4  . escitalopram (LEXAPRO) 10 MG tablet Take 1 tablet (10 mg total) by mouth daily. 90 tablet 0  . latanoprost (XALATAN) 0.005 % ophthalmic solution Place 1 drop into both eyes at bedtime.    Marland Kitchen lisinopril (PRINIVIL,ZESTRIL) 20 MG tablet Take 1 tablet (20 mg total) by mouth daily. 90 tablet 1  . potassium chloride SA (K-DUR,KLOR-CON) 20 MEQ tablet Take 1 tablet (20 mEq  total) by mouth 2 (two) times daily. 90 tablet 6  . tamoxifen (NOLVADEX) 20 MG tablet Take 1 tablet (20 mg total) by mouth daily. 90 tablet 3  . timolol (BETIMOL) 0.5 % ophthalmic solution Place 1 drop into both eyes daily.     No current facility-administered medications for this visit.      PHYSICAL EXAMINATION: ECOG PERFORMANCE STATUS: 0 - Asymptomatic  Vitals:   08/12/17 0911  BP: (!) 148/73  Pulse: 70  Resp: 17  Temp: 98.3 F (36.8 C)  SpO2: 97%   Filed Weights   08/12/17 0911  Weight: 213 lb 1.6 oz (96.7 kg)    GENERAL:alert, no distress and comfortable SKIN: skin color, texture, turgor are normal, no rashes or significant lesions EYES: normal, Conjunctiva are pink and non-injected, sclera clear OROPHARYNX:no exudate, no erythema and lips, buccal mucosa, and tongue normal  NECK: supple, thyroid normal size, non-tender, without nodularity LYMPH:  no palpable lymphadenopathy in the cervical, axillary or inguinal LUNGS: clear to auscultation and percussion with normal breathing effort HEART: regular rate & rhythm and no murmurs and no lower extremity edema ABDOMEN:abdomen soft, non-tender and normal bowel sounds MUSCULOSKELETAL:no cyanosis of digits and no clubbing  NEURO: alert & oriented x 3 with fluent speech, no focal motor/sensory deficits EXTREMITIES: No lower extremity edema BREAST: No palpable masses or nodules in either right or left breasts. No palpable axillary supraclavicular or infraclavicular adenopathy no breast tenderness or nipple discharge. (exam performed in the presence of a chaperone)  LABORATORY DATA:  I have reviewed the data as listed CMP Latest Ref Rng & Units 07/17/2017 11/09/2015 09/01/2014  Glucose 70 - 99 mg/dL 102(H) 98 104(H)  BUN 6 - 23 mg/dL '6 9 7  '$ Creatinine 0.40 - 1.20 mg/dL 0.47 0.55 0.60  Sodium 135 - 145 mEq/L 128(L) 145 142  Potassium 3.5 - 5.1 mEq/L 3.4(L) 4.2 4.2  Chloride 96 - 112 mEq/L 90(L) 105 103  CO2 19 - 32 mEq/L 28 32 30  Calcium 8.4 - 10.5 mg/dL 8.9 9.3 9.1  Total Protein 6.0 - 8.3 g/dL 6.6 6.5 6.5  Total Bilirubin 0.2 - 1.2 mg/dL 0.7 0.5 0.5  Alkaline Phos 39 - 117 U/L 63 54 53  AST 0 - 37 U/L 41(H) 27 23  ALT 0 - 35 U/L 36(H) 25 23    Lab Results  Component Value Date   WBC 4.1 07/17/2017   HGB  12.9 07/17/2017   HCT 36.4 07/17/2017   MCV 88.3 07/17/2017   PLT 220.0 07/17/2017   NEUTROABS 2.6 07/17/2017    ASSESSMENT & PLAN:  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  Patient teaches a medical history at [employer]  Tamoxifen toxicities: Patient is now tolerating tamoxifen extremely well without any major problems or concerns. She will complete 5 years of therapy by March 2020.  Breast Cancer Surveillance: 1. Breast exam 08/12/17: No palpable lumps or nodules 2. Mammogram 11/01/2016 at [facility] No abnormalities. Postsurgical changes. Breast Density Category A.  Return to clinic in 1 year for follow-up with long-term survivorship clinic   No orders of the defined types were placed in this encounter.  The patient has a good understanding of the overall plan. she agrees with it. she will call with any problems that may develop before the next visit here.   Harriette Ohara, MD 08/12/17

## 2017-09-04 ENCOUNTER — Ambulatory Visit (INDEPENDENT_AMBULATORY_CARE_PROVIDER_SITE_OTHER): Payer: Medicare Other | Admitting: Pulmonary Disease

## 2017-09-04 ENCOUNTER — Encounter: Payer: Self-pay | Admitting: Pulmonary Disease

## 2017-09-04 VITALS — BP 134/72 | HR 71 | Ht 62.5 in | Wt 208.0 lb

## 2017-09-04 DIAGNOSIS — Z9989 Dependence on other enabling machines and devices: Secondary | ICD-10-CM

## 2017-09-04 DIAGNOSIS — G4733 Obstructive sleep apnea (adult) (pediatric): Secondary | ICD-10-CM

## 2017-09-04 NOTE — Progress Notes (Signed)
[facility]  Chief Complaint  Patient presents with  . Follow-up    needs new order for cpap supplies.  pt denies any current complaints with cpap.  does not have SD card.     Constitutional: BP 134/72 (BP Location: Left Arm, Cuff Size: Large)   Pulse 71   Ht 5' 2.5" (1.588 m)   Wt 208 lb (94.3 kg)   SpO2 96%   BMI 37.44 kg/m   History of Present Illness: Chelsea Moreno is a 67 y.o. female with obstructive sleep apnea.  Chelsea Moreno has been doing well with CPAP.  Has nasal cushion mask.  Gets marks on her face in the morning.  Has bloating at night about once per week.  Goes to bed at 11 pm.  Falls asleep quickly.  Wakes up at 6 am on weekdays, and 8 am on weekends.  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  Has trouble staying alert at times when working on computer, but doesn't fall asleep.  Denies sinus congestion, or sore throat.  Has occasional dry mouth at night.  Uses [employer] for her DME.   Comprehensive Respiratory Exam:  Appearance - well kempt  ENMT - nasal mucosa moist, turbinates clear, midline nasal septum, no dental lesions, no gingival bleeding, no oral exudates, no tonsillar hypertrophy Neck - no masses, trachea midline, no thyromegaly, no elevation in JVP Respiratory - normal appearance of chest wall, normal respiratory effort w/o accessory muscle use, no wheezing or rales CV - s1s2 regular rate and rhythm, no murmurs, no peripheral edema GI - soft, non tender, no masses Lymph - no adenopathy noted in neck and axillary areas MSK - normal muscle strength and tone, normal gait Ext - no cyanosis, clubbing, or joint inflammation noted Skin - no rashes, lesions, or ulcers Neuro - oriented to person, place, and time Psych - normal mood and affect   Assessment/Plan:  Obstructive sleep apnea. - Chelsea Moreno is compliant with CPAP and reports benefit - continue auto CPAP  - discussed options to assist with mask fit - Chelsea Moreno has intermittent  aerophagia with bloating >> if this gets worse, then consider changing mask or adjusting pressure   Patient Instructions  Can look up CPAP mask options on line Will arrange for new CPAP supplies Follow up in 1 year    Chesley Mires, MD [place] 09/04/2017, 12:20 PM  Flow Sheet  Sleep tests: HST 09/01/14 >> AHI 17.8, SaO2 low 85% Auto CPAP 11/08/15 to 12/07/15 >> used on 30 of 30 nights with average 6 hrs 43 min.  Average AHI 1.1 with median CPAP 10 and 95 th percentile CPAP 14 cm H2O  Past Medical History: Chelsea Moreno  has a past medical history of Breast cancer ([place]) (11/05/12), Bursitis, COLONIC POLYPS, HX OF (09/24/2008), DEPRESSION (09/23/2007), Diabetes insipidus ([place]), Diarrhea, Dizziness, Family history of ovarian cancer, Hemorrhoids, History of bronchitis, History of migraine, radiation therapy (01/29/13- 03/16/13), HYPERLIPIDEMIA (09/06/2006), HYPERTENSION (09/06/2006), Muscle spasms of head and/or neck, OSTEOPOROSIS (09/24/2008), and Vitamin D deficiency.  Past Surgical History: Chelsea Moreno  has a past surgical history that includes Breast biopsy (Left, 57yrs ago); Colonoscopy; and Breast lumpectomy with needle localization (Left, 12/16/2012).  Family History: Her family history includes ALS in her brother; Alcohol abuse in her maternal grandfather; Cancer in her maternal aunt and mother; Heart attack in her father, maternal grandmother, and mother; Heart attack (age of onset: 56) in her brother; Heart attack (age of onset: 63) in her sister;  Heart disease in her brother, father, mother, and sister; Hyperlipidemia in her father; Hypertension in her brother, father, and sister; Ovarian cancer in her mother; Parkinson's disease in her sister; Varicose Veins in her mother.  Social History: Chelsea Moreno  reports that Chelsea Moreno quit smoking about 44 years ago. Her smoking use included cigarettes. Chelsea Moreno has a 2.00 pack-year smoking history. Chelsea Moreno has never used smokeless tobacco. Chelsea Moreno reports that Chelsea Moreno drinks  alcohol. Chelsea Moreno reports that Chelsea Moreno does not use drugs.  Medications: Allergies as of 09/04/2017   No Known Allergies     Medication List        Accurate as of 09/04/17 12:20 PM. Always use your most recent med list.          atorvastatin 80 MG tablet Commonly known as:  LIPITOR Take 1 tablet (80 mg total) by mouth daily.   desmopressin 0.01 % solution Commonly known as:  DDAVP NASAL PLACE 1 SPRAY INTO EACH NOSTRIL 2 TIMES A DAY   escitalopram 10 MG tablet Commonly known as:  LEXAPRO Take 1 tablet (10 mg total) by mouth daily.   latanoprost 0.005 % ophthalmic solution Commonly known as:  XALATAN Place 1 drop into both eyes at bedtime.   lisinopril 20 MG tablet Commonly known as:  PRINIVIL,ZESTRIL Take 1 tablet (20 mg total) by mouth daily.   tamoxifen 20 MG tablet Commonly known as:  NOLVADEX Take 1 tablet (20 mg total) by mouth daily.   timolol 0.5 % ophthalmic solution Commonly known as:  BETIMOL Place 1 drop into both eyes daily.

## 2017-09-04 NOTE — Patient Instructions (Signed)
Can look up CPAP mask options on line Will arrange for new CPAP supplies Follow up in 1 year

## 2017-11-06 DIAGNOSIS — Z853 Personal history of malignant neoplasm of breast: Secondary | ICD-10-CM | POA: Diagnosis not present

## 2017-11-06 LAB — HM MAMMOGRAPHY

## 2017-11-20 ENCOUNTER — Encounter: Payer: Self-pay | Admitting: Internal Medicine

## 2017-12-16 ENCOUNTER — Other Ambulatory Visit: Payer: Self-pay | Admitting: Internal Medicine

## 2017-12-17 DIAGNOSIS — H40023 Open angle with borderline findings, high risk, bilateral: Secondary | ICD-10-CM | POA: Diagnosis not present

## 2017-12-17 DIAGNOSIS — H40053 Ocular hypertension, bilateral: Secondary | ICD-10-CM | POA: Diagnosis not present

## 2017-12-17 DIAGNOSIS — H5212 Myopia, left eye: Secondary | ICD-10-CM | POA: Diagnosis not present

## 2017-12-17 DIAGNOSIS — H2513 Age-related nuclear cataract, bilateral: Secondary | ICD-10-CM | POA: Diagnosis not present

## 2017-12-19 ENCOUNTER — Other Ambulatory Visit: Payer: Self-pay | Admitting: *Deleted

## 2017-12-19 MED ORDER — ATORVASTATIN CALCIUM 80 MG PO TABS: 80.0000 mg | ORAL_TABLET | Freq: Every day | ORAL | 0 refills | Status: DC

## 2018-01-06 ENCOUNTER — Other Ambulatory Visit: Payer: Self-pay | Admitting: Internal Medicine

## 2018-01-16 ENCOUNTER — Other Ambulatory Visit: Payer: Self-pay | Admitting: *Deleted

## 2018-01-31 DIAGNOSIS — M25551 Pain in right hip: Secondary | ICD-10-CM | POA: Diagnosis not present

## 2018-01-31 DIAGNOSIS — C50312 Malignant neoplasm of lower-inner quadrant of left female breast: Secondary | ICD-10-CM | POA: Diagnosis not present

## 2018-02-03 ENCOUNTER — Telehealth: Payer: Self-pay | Admitting: Internal Medicine

## 2018-02-03 NOTE — Telephone Encounter (Signed)
I have scheduled patient to establish care in March.

## 2018-02-03 NOTE — Telephone Encounter (Signed)
-----   Message from Binnie Rail, MD sent at 02/01/2018 12:15 PM EST ----- Regarding: FYI - accepted a new pt  ----- Message ----- From: Stark Klein, MD Sent: 02/01/2018  10:22 AM EST To: April A Margart Sickles, MD  Thanks Dr. Quay Burow.  April, can you or Kim call patient and let her know? Thanks, FB ----- Message ----- From: Binnie Rail, MD Sent: 02/01/2018  10:06 AM EST To: Stark Klein, MD  Sure, I can accept her.  Just have her call and set up an appointment.   Stacy  ----- Message ----- From: Stark Klein, MD Sent: 01/31/2018  12:13 PM EST To: Binnie Rail, MD  Dr. Quay Burow, I am wondering if you would be willing to see this patient.  She was a patient of Dr. Marthann Schiller before he retired.  She has had some atypical near syncope that has not really been worked up well.    I have been seeing her for breast cancer, and she is now 5 years out.    Thanks for your consideration.   Stark Klein, MD

## 2018-02-17 DIAGNOSIS — M7061 Trochanteric bursitis, right hip: Secondary | ICD-10-CM | POA: Diagnosis not present

## 2018-02-17 DIAGNOSIS — M25551 Pain in right hip: Secondary | ICD-10-CM | POA: Diagnosis not present

## 2018-03-03 DIAGNOSIS — M25551 Pain in right hip: Secondary | ICD-10-CM | POA: Diagnosis not present

## 2018-03-05 DIAGNOSIS — M25551 Pain in right hip: Secondary | ICD-10-CM | POA: Diagnosis not present

## 2018-03-12 DIAGNOSIS — M25551 Pain in right hip: Secondary | ICD-10-CM | POA: Diagnosis not present

## 2018-03-17 DIAGNOSIS — M25551 Pain in right hip: Secondary | ICD-10-CM | POA: Diagnosis not present

## 2018-03-21 DIAGNOSIS — M25551 Pain in right hip: Secondary | ICD-10-CM | POA: Diagnosis not present

## 2018-03-23 NOTE — Progress Notes (Signed)
Subjective:    Patient ID: Chelsea Moreno, female    DOB: 1950/04/13, 68 y.o.   MRN: [id number]  HPI  She is here to establish with a new pcp.  The patient is here for follow up.  Hypertension: She is taking her medication daily. She is compliant with a low sodium diet.  She denies chest pain, palpitations, shortness of breath and regular headaches. She is not exercising regularly.  She does not monitor her blood pressure at home.    Depression: she stopped the lexapro 6 months ago.  Her prescription had run out and she does stop the medication.  She denies any depression since being off the medication and actually feels better off medication.  H/o Breast cancer:  She was diagnosed October 2014.  She had a lumpectomy.  She has been on tamoxifen and may go off this summer.  She currently follows with surgery once a year and oncology once a year.  Hyperlipidemia: She is taking her medication daily.she has a strong family history of CAD and stroke.  Both her parents had CAD and her sister had a stroke and heart disease.  1 of her brothers had heart disease as well..  She is compliant with a low fat/cholesterol diet. She is not exercising regularly.   She uses cpap nightly.   Diabetes insipidus:  She was diagnosed around age 55 or 80.  She had nocturia and polyuria.  She was diagnosed at [facility].  She has had a variety of treatments over the year.  She takes the desmopressin nasal spray twice daily.  She denies nocturia if she is taking her medication.  She drinks during the day, but is not thirsty.    Prediabetes:  She is not compliant with a low sugar/carbohydrate diet.  She eats more carbs due to digestive issues.  She is not exercising regularly.  Episodes of nausea, vomiting and diarrhea: She has had several episodes since 2006, which was her first episode.  For no reason the episodes occur.  They tend to happen in the middle the night-she will have nausea, vomiting and diarrhea.  She has  fainted.  She has discussed this with Dr. Gwenlyn Saran in the past and after her first episode he thought it was a vasovagal episode.  Her worst episode was about 2 years ago.  She had significant nausea, vomiting and diarrhea and thought she was going to die.  Typically she was having 1-2 episodes per year, but she has not had an episode in the past 2 years and is unsure why.  At times she may have 2 within 2 months.  She was not able to find any pattern to it.  The episodes could wake her up in the middle of the night.  She would sometimes first wake up with a tingling sensation or chill across her upper body and she knew it was coming.  She did see GI and neurology for this and no cause was found.  Aching in throat or squeezing in throat: For a while she was experiencing an achy sensation or squeezing in her throat after walking for prolonged period of time.  This was also occurring at rest on occasion.  It may last a few minutes and then it would go away.  She is only had it 1 time in the last 6 months.  Seem to have gotten better and she is not sure why.  She did not see anyone for this.    Medications  and allergies reviewed with patient and updated if appropriate.  Patient Active Problem List   Diagnosis Date Noted   Genetic testing 02/26/2017   Family history of ovarian cancer    OSA (obstructive sleep apnea) 09/08/2014   Varicose veins of leg with swelling 08/13/2014   Chronic venous insufficiency 06/02/2014   Cancer of lower-inner quadrant of left female breast ([place]) 42/68/3419   Lichen planus 62/22/9798   Diabetes insipidus ([place]) 03/28/2011   Osteoporosis 09/24/2008   Depression 09/23/2007   Dyslipidemia 09/06/2006   Essential hypertension 09/06/2006    Current Outpatient Medications on File Prior to Visit  Medication Sig Dispense Refill   atorvastatin (LIPITOR) 80 MG tablet Take 1 tablet (80 mg total) by mouth daily. Please schedule an appointment with a new provider for  further refill 90 tablet 0   desmopressin (DDAVP NASAL) 0.01 % solution PLACE 1 SPRAY INTO EACH NOSTRIL 2 TIMES A DAY 10 mL 4   latanoprost (XALATAN) 0.005 % ophthalmic solution Place 1 drop into both eyes at bedtime.     lisinopril (PRINIVIL,ZESTRIL) 20 MG tablet Take 1 tablet (20 mg total) by mouth daily. 90 tablet 1   tamoxifen (NOLVADEX) 20 MG tablet Take 1 tablet (20 mg total) by mouth daily. 90 tablet 1   timolol (BETIMOL) 0.5 % ophthalmic solution Place 1 drop into both eyes daily.     No current facility-administered medications on file prior to visit.     Past Medical History:  Diagnosis Date   Breast cancer ([place]) 11/05/12   left   Bursitis    COLONIC POLYPS, HX OF 09/24/2008   DEPRESSION 09/23/2007   takes Lexapro daily but hasn't taken since Sept 2014   Diabetes insipidus [facility])    takes Desmopressin bid   Diarrhea    Dizziness    when nauseated gets dizzy   Family history of ovarian cancer    Hemorrhoids    History of bronchitis    yrs ago   History of migraine    last time many yrs ago   Hx of radiation therapy 01/29/13- 03/16/13   left breast 4500 cGy 25 sessions, left breast boost 1000 cGy 5 sessions   HYPERLIPIDEMIA 09/06/2006   takes Atorvasatin nightly   HYPERTENSION 09/06/2006   takes Lisinopril daily   Muscle spasms of head and/or neck    OSTEOPOROSIS 09/24/2008   takes Actonel every 30days   Vitamin D deficiency    takes OTC Vit D    Past Surgical History:  Procedure Laterality Date   BREAST BIOPSY Left 55yrs ago   BREAST LUMPECTOMY WITH NEEDLE LOCALIZATION Left 12/16/2012   Procedure: PARTIAL MASTECTOMY WITH NEEDLE LOCALIZATION;  Surgeon: Stark Klein, MD;  Location: [facility];  Service: General;  Laterality: Left;  1:00 NL at [place] History   Socioeconomic History   Marital status: Widowed    Spouse name: Not on file   Number of children: Not on file   Years of education: Not on file   Highest  education level: Not on file  Occupational History   Occupation: Professor at [employer] strain: Not on file   Food insecurity:    Worry: Not on file    Inability: Not on file   Transportation needs:    Medical: Not on file    Non-medical: Not on file  Tobacco Use   Smoking status: Former Smoker    Packs/day: 0.50  Years: 4.00    Pack years: 2.00    Types: Cigarettes    Last attempt to quit: 01/07/1973    Years since quitting: 45.2   Smokeless tobacco: Never Used   Tobacco comment: quit 34yrs ago  Substance and Sexual Activity   Alcohol use: Yes    Alcohol/week: 0.0 standard drinks    Comment: socailly   Drug use: No   Sexual activity: Not Currently    Birth control/protection: Post-menopausal    Comment: menarche age 45, first live birth age 18, menopause 2002, no HRT  Lifestyle   Physical activity:    Days per week: Not on file    Minutes per session: Not on file   Stress: Not on file  Relationships   Social connections:    Talks on phone: Not on file    Gets together: Not on file    Attends religious service: Not on file    Active member of club or organization: Not on file    Attends meetings of clubs or organizations: Not on file    Relationship status: Not on file  Other Topics Concern   Not on file  Social History Narrative   Not on file    Family History  Problem Relation Age of Onset   Ovarian cancer Mother    Cancer Mother        Ovarian   Heart disease Mother        Before age 44   Heart attack Mother    Varicose Veins Mother    Heart disease Father        Before age 78   Hyperlipidemia Father    Hypertension Father    Heart attack Father    Heart disease Sister        Before age 13   Hypertension Sister    Heart attack Sister 41       second heart attack at 42   Heart disease Brother        Before age 8   Hypertension Brother    Heart attack Brother 3   Cancer Maternal  Aunt        GYN cancer   Heart attack Maternal Grandmother    Alcohol abuse Maternal Grandfather    Parkinson's disease Sister    ALS Brother     Review of Systems  Constitutional: Negative for chills and fever.  Respiratory: Negative for cough, shortness of breath and wheezing.   Cardiovascular: Positive for leg swelling (as day progresses - goes away overnight). Negative for chest pain and palpitations.  Gastrointestinal: Positive for nausea (occasionally and with episodes). Negative for abdominal pain and vomiting (only with episodes).  Neurological: Positive for light-headedness (very occasional) and headaches (goes away with tylenol).       Objective:   Vitals:   03/24/18 1057  BP: (!) 150/76  Pulse: 70  Resp: 16  Temp: 97.8 F (36.6 C)  SpO2: 97%   BP Readings from Last 3 Encounters:  03/24/18 (!) 150/76  09/04/17 134/72  08/12/17 (!) 148/73   Wt Readings from Last 3 Encounters:  03/24/18 213 lb 1.9 oz (96.7 kg)  09/04/17 208 lb (94.3 kg)  08/12/17 213 lb 1.6 oz (96.7 kg)   Body mass index is 38.36 kg/m.   Physical Exam    Constitutional: Appears well-developed and well-nourished. No distress.  HENT:  Head: Normocephalic and atraumatic.  Neck: Neck supple. No tracheal deviation present. No thyromegaly present.  No cervical lymphadenopathy Cardiovascular: Normal  rate, regular rhythm and normal heart sounds.   No murmur heard. No carotid bruit .  No edema Pulmonary/Chest: Effort normal and breath sounds normal. No respiratory distress. No has no wheezes. No rales.  Skin: Skin is warm and dry. Not diaphoretic.  Psychiatric: Normal mood and affect. Behavior is normal.      Assessment & Plan:    See Problem List for Assessment and Plan of chronic medical problems.

## 2018-03-24 ENCOUNTER — Other Ambulatory Visit (INDEPENDENT_AMBULATORY_CARE_PROVIDER_SITE_OTHER): Payer: Medicare Other

## 2018-03-24 ENCOUNTER — Ambulatory Visit (INDEPENDENT_AMBULATORY_CARE_PROVIDER_SITE_OTHER): Payer: Medicare Other | Admitting: Internal Medicine

## 2018-03-24 ENCOUNTER — Other Ambulatory Visit: Payer: Self-pay

## 2018-03-24 ENCOUNTER — Encounter: Payer: Self-pay | Admitting: Internal Medicine

## 2018-03-24 VITALS — BP 150/76 | HR 70 | Temp 97.8°F | Resp 16 | Ht 62.5 in | Wt 213.1 lb

## 2018-03-24 DIAGNOSIS — R6889 Other general symptoms and signs: Secondary | ICD-10-CM | POA: Diagnosis not present

## 2018-03-24 DIAGNOSIS — E785 Hyperlipidemia, unspecified: Secondary | ICD-10-CM | POA: Diagnosis not present

## 2018-03-24 DIAGNOSIS — I10 Essential (primary) hypertension: Secondary | ICD-10-CM

## 2018-03-24 DIAGNOSIS — G4733 Obstructive sleep apnea (adult) (pediatric): Secondary | ICD-10-CM

## 2018-03-24 DIAGNOSIS — R7303 Prediabetes: Secondary | ICD-10-CM

## 2018-03-24 DIAGNOSIS — E669 Obesity, unspecified: Secondary | ICD-10-CM | POA: Insufficient documentation

## 2018-03-24 DIAGNOSIS — R197 Diarrhea, unspecified: Secondary | ICD-10-CM

## 2018-03-24 DIAGNOSIS — R1110 Vomiting, unspecified: Secondary | ICD-10-CM

## 2018-03-24 DIAGNOSIS — E232 Diabetes insipidus: Secondary | ICD-10-CM

## 2018-03-24 DIAGNOSIS — Z6838 Body mass index (BMI) 38.0-38.9, adult: Secondary | ICD-10-CM

## 2018-03-24 DIAGNOSIS — R0989 Other specified symptoms and signs involving the circulatory and respiratory systems: Secondary | ICD-10-CM | POA: Insufficient documentation

## 2018-03-24 DIAGNOSIS — F3289 Other specified depressive episodes: Secondary | ICD-10-CM | POA: Diagnosis not present

## 2018-03-24 LAB — COMPREHENSIVE METABOLIC PANEL
ALT: 20 U/L (ref 0–35)
AST: 19 U/L (ref 0–37)
Albumin: 4.5 g/dL (ref 3.5–5.2)
Alkaline Phosphatase: 63 U/L (ref 39–117)
BUN: 9 mg/dL (ref 6–23)
CALCIUM: 9.1 mg/dL (ref 8.4–10.5)
CO2: 28 mEq/L (ref 19–32)
CREATININE: 0.5 mg/dL (ref 0.40–1.20)
Chloride: 92 mEq/L — ABNORMAL LOW (ref 96–112)
GFR: 122.94 mL/min (ref >60.00)
Glucose, Bld: 114 mg/dL — ABNORMAL HIGH (ref 70–99)
Potassium: 4.2 mEq/L (ref 3.5–5.1)
Sodium: 128 mEq/L — ABNORMAL LOW (ref 135–145)
Total Bilirubin: 0.6 mg/dL (ref 0.2–1.2)
Total Protein: 7.1 g/dL (ref 6.0–8.3)

## 2018-03-24 LAB — CBC WITH DIFFERENTIAL/PLATELET
Basophils Absolute: 0 10*3/uL (ref 0.0–0.1)
Basophils Relative: 0.5 % (ref 0.0–3.0)
Eosinophils Absolute: 0.1 10*3/uL (ref 0.0–0.7)
Eosinophils Relative: 1.7 % (ref 0.0–5.0)
HCT: 36.4 % (ref 36.0–46.0)
Hemoglobin: 13 g/dL (ref 12.0–15.0)
LYMPHS PCT: 28.3 % (ref 12.0–46.0)
Lymphs Abs: 1.3 10*3/uL (ref 0.7–4.0)
MCHC: 35.7 g/dL (ref 30.0–36.0)
MCV: 89.2 fl (ref 78.0–100.0)
Monocytes Absolute: 0.5 10*3/uL (ref 0.1–1.0)
Monocytes Relative: 10.2 % (ref 3.0–12.0)
Neutro Abs: 2.7 10*3/uL (ref 1.4–7.7)
Neutrophils Relative %: 59.3 % (ref 43.0–77.0)
Platelets: 281 10*3/uL (ref 150.0–400.0)
RBC: 4.08 Mil/uL (ref 3.87–5.11)
RDW: 12.6 % (ref 11.5–15.5)
WBC: 4.5 10*3/uL (ref 4.0–10.5)

## 2018-03-24 LAB — HEMOGLOBIN A1C: Hgb A1c MFr Bld: 6.1 % (ref 4.6–6.5)

## 2018-03-24 MED ORDER — ATORVASTATIN CALCIUM 80 MG PO TABS: 80.0000 mg | ORAL_TABLET | Freq: Every day | ORAL | 1 refills | Status: DC

## 2018-03-24 MED ORDER — LISINOPRIL 20 MG PO TABS: 20.0000 mg | ORAL_TABLET | Freq: Every day | ORAL | 1 refills | Status: DC

## 2018-03-24 MED ORDER — DESMOPRESSIN ACETATE SPRAY 0.01 % NA SOLN: NASAL | 4 refills | Status: DC

## 2018-03-24 NOTE — Assessment & Plan Note (Addendum)
Diagnosed around age 68 Has been on a variety of treatments over the years, but has been stable on desmopressin for several years Takes the nasal spray twice a day and that controls her symptoms very well No nocturia, no polyuria We will continue current dose CMP

## 2018-03-24 NOTE — Assessment & Plan Note (Addendum)
Has diabetes, hypertension, hyperlipidemia Encouraged regular exercise, healthy diet Follow up in 6 months

## 2018-03-24 NOTE — Assessment & Plan Note (Signed)
Over the past year she had several episodes of throat tightness that occurred at rest and with activity Has only had one episode in the last 6 months so this is actually gotten better ?  Atypical CAD versus GERD Since her symptoms have improved and she has not had any episodes recently will not evaluate further Asked that she let me know if she has any further episodes

## 2018-03-24 NOTE — Assessment & Plan Note (Signed)
Occurs in episodes-started 2016 and she was having approximately 1-2 episodes a year, but has not had any episodes in 2 years Severe nausea, vomiting and diarrhea that has resulted in syncope No obvious cause Has seen GI and neurology in the past Since she has not had any episodes in 2 years we will not evaluate further at this time, but if she does she will let me know

## 2018-03-24 NOTE — Patient Instructions (Addendum)
  Tests ordered today. Your results will be released to [place] (or called to you) after review, usually within 72hours after test completion. If any changes need to be made, you will be notified at that same time.   Medications reviewed and updated.  Changes include :   none  Your prescription(s) have been submitted to your pharmacy. Please take as directed and contact our office if you believe you are having problem(s) with the medication(s).   Please followup in 6 months, sooner if needed

## 2018-03-24 NOTE — Assessment & Plan Note (Signed)
Likely genetic-has been on a statin since she was in her 36s Cholesterol fairly controlled Will not check lipid panel today since she is not fasting Continue atorvastatin 80 mg daily Encouraged regular exercise

## 2018-03-24 NOTE — Assessment & Plan Note (Signed)
Blood pressure elevated here today She does not monitor at home ?  Controlled or not She is not currently exercising and recommended regular exercise Since this is her first visit here I will not make any changes today Continue her current medication cmp

## 2018-03-24 NOTE — Assessment & Plan Note (Signed)
Stop medication 6 months ago Denies depression and feels better off medication Will monitor off medication

## 2018-03-24 NOTE — Assessment & Plan Note (Signed)
Moderate in nature Using CPAP nightly Follows with pulmonary, but has not seen them in a while Has had increased gas in stomach from CPAP-?  Needing adjustment Advised follow-up with pulmonary

## 2018-03-24 NOTE — Assessment & Plan Note (Signed)
Check a1c Low sugar / carb diet Stressed regular exercise   

## 2018-03-30 ENCOUNTER — Other Ambulatory Visit: Payer: Self-pay | Admitting: Internal Medicine

## 2018-03-30 ENCOUNTER — Encounter: Payer: Self-pay | Admitting: Internal Medicine

## 2018-03-30 DIAGNOSIS — E232 Diabetes insipidus: Secondary | ICD-10-CM

## 2018-03-30 DIAGNOSIS — E871 Hypo-osmolality and hyponatremia: Secondary | ICD-10-CM | POA: Insufficient documentation

## 2018-03-31 ENCOUNTER — Other Ambulatory Visit: Payer: Self-pay | Admitting: Internal Medicine

## 2018-03-31 MED ORDER — DESMOPRESSIN ACETATE SPRAY 0.01 % NA SOLN: NASAL | 4 refills | Status: DC

## 2018-04-07 ENCOUNTER — Other Ambulatory Visit (INDEPENDENT_AMBULATORY_CARE_PROVIDER_SITE_OTHER): Payer: Medicare Other

## 2018-04-07 ENCOUNTER — Encounter: Payer: Self-pay | Admitting: Internal Medicine

## 2018-04-07 DIAGNOSIS — E871 Hypo-osmolality and hyponatremia: Secondary | ICD-10-CM | POA: Diagnosis not present

## 2018-04-07 LAB — BASIC METABOLIC PANEL
BUN: 8 mg/dL (ref 6–23)
CO2: 32 meq/L (ref 19–32)
Calcium: 9.6 mg/dL (ref 8.4–10.5)
Chloride: 106 mEq/L (ref 96–112)
Creatinine, Ser: 0.61 mg/dL (ref 0.40–1.20)
GFR: 97.72 mL/min (ref >60.00)
Glucose, Bld: 125 mg/dL — ABNORMAL HIGH (ref 70–99)
Potassium: 3.8 mEq/L (ref 3.5–5.1)
Sodium: 144 mEq/L (ref 135–145)

## 2018-04-25 ENCOUNTER — Ambulatory Visit (INDEPENDENT_AMBULATORY_CARE_PROVIDER_SITE_OTHER): Payer: Medicare Other | Admitting: Internal Medicine

## 2018-04-25 ENCOUNTER — Encounter: Payer: Self-pay | Admitting: Internal Medicine

## 2018-04-25 ENCOUNTER — Encounter: Payer: Self-pay | Admitting: Hematology and Oncology

## 2018-04-25 DIAGNOSIS — E871 Hypo-osmolality and hyponatremia: Secondary | ICD-10-CM

## 2018-04-25 DIAGNOSIS — E232 Diabetes insipidus: Secondary | ICD-10-CM | POA: Diagnosis not present

## 2018-04-25 NOTE — Progress Notes (Signed)
Virtual Visit via Video Note  I connected with Chelsea Moreno on 04/25/18 at  1:20 PM EDT by a video enabled telemedicine application and verified that I am speaking with the correct person using two identifiers.   I discussed the limitations of evaluation and management by telemedicine and the availability of in person appointments. The patient expressed understanding and agreed to proceed.  -Location of the patient : Home -Location of the provider : Office  -The names of all persons participating in the telemedicine service : [place]      Name: Chelsea Moreno  MRN/ DOB: [id number], 14-Jan-1950    Age/ Sex: 68 y.o., female    PCP: Binnie Rail, MD   Reason for Endocrinology Evaluation: Diabetes Insipidus (DI)     Date of Initial Endocrinology Evaluation: 04/25/2018     HPI: Chelsea Moreno is a 68 y.o. female with a past medical history of DI, HTN and Dyslipidemia. The patient presented for initial endocrinology clinic visit on 04/25/2018 for consultative assistance with her DI .   Pt has been diagnosed with DI at the age of 26. She presented with polyuria and polydipsia to where she was not able to perform her daily activities or school activities, she was living in [place], [place] at the time, was evaluated at [facility] for ~ 2 weeks and was initially on injections, followed by a powder medicine but around 2010 she was started on Desmopressin 1 spray Q12 hrs.   The cause of DI is unclear but she believes this was attributed to her recurrent falls as a child.    She has been on Desmopressin 1 spray Q12 hrs until March, 2020 when she was noted with hyponatremia Na 128 mEq/L, and was asked to reduce the dose to once a day but that patient felt miserable with polydipsia, drinking ~ 2 gallons of fluid at a time and polyuria with the urge to urinate every 45 minutes. Her repeat sodium was 144 mEq/L but the patient is now using desmopressin ~ every 18 hrs, the last time she used it was  ~ 8:30 pm and the time prior to that was around 12:45 AM.   Prior to all this , when she was on Q12 desmopressin dosing she tends to drink water out of habit.      She has no FH of DI     HISTORY:  Past Medical History:  Past Medical History:  Diagnosis Date   Breast cancer ([place]) 11/05/12   left   Bursitis    COLONIC POLYPS, HX OF 09/24/2008   DEPRESSION 09/23/2007   takes Lexapro daily but hasn't taken since Sept 2014   Diabetes insipidus ([place])    takes Desmopressin bid   Diarrhea    Dizziness    when nauseated gets dizzy   Family history of ovarian cancer    Hemorrhoids    History of bronchitis    yrs ago   History of migraine    last time many yrs ago   Hx of radiation therapy 01/29/13- 03/16/13   left breast 4500 cGy 25 sessions, left breast boost 1000 cGy 5 sessions   HYPERLIPIDEMIA 09/06/2006   takes Atorvasatin nightly   HYPERTENSION 09/06/2006   takes Lisinopril daily   Muscle spasms of head and/or neck    OSTEOPOROSIS 09/24/2008   takes Actonel every 30days   Vitamin D deficiency    takes OTC Vit D    Past Surgical History:  Past Surgical  History:  Procedure Laterality Date   BREAST BIOPSY Left 17yrs ago   BREAST LUMPECTOMY WITH NEEDLE LOCALIZATION Left 12/16/2012   Procedure: PARTIAL MASTECTOMY WITH NEEDLE LOCALIZATION;  Surgeon: Stark Klein, MD;  Location: [place];  Service: General;  Laterality: Left;  1:00 NL at [place] History:  reports that she quit smoking about 45 years ago. Her smoking use included cigarettes. She has a 2.00 pack-year smoking history. She has never used smokeless tobacco. She reports current alcohol use. She reports that she does not use drugs.  Family History: family history includes ALS in her brother; Alcohol abuse in her maternal grandfather; Cancer in her maternal aunt and mother; Heart attack in her father, maternal grandmother, and mother; Heart attack (age of onset: 79) in her  brother; Heart attack (age of onset: 75) in her sister; Heart disease in her brother, father, mother, and sister; Hyperlipidemia in her father; Hypertension in her brother, father, and sister; Ovarian cancer in her mother; Parkinson's disease in her sister; Varicose Veins in her mother.   HOME MEDICATIONS: Allergies as of 04/25/2018   No Known Allergies     Medication List       Accurate as of April 25, 2018 12:17 PM. Always use your most recent med list.        atorvastatin 80 MG tablet Commonly known as:  LIPITOR Take 1 tablet (80 mg total) by mouth daily.   desmopressin 0.01 % solution Commonly known as:  DDAVP NASAL PLACE 1 SPRAY INTO EACH NOSTRIL 1 TIME A DAY   latanoprost 0.005 % ophthalmic solution Commonly known as:  XALATAN Place 1 drop into both eyes at bedtime.   lisinopril 20 MG tablet Commonly known as:  ZESTRIL Take 1 tablet (20 mg total) by mouth daily.   tamoxifen 20 MG tablet Commonly known as:  NOLVADEX Take 1 tablet (20 mg total) by mouth daily.   timolol 0.5 % ophthalmic solution Commonly known as:  BETIMOL Place 1 drop into both eyes daily.         REVIEW OF SYSTEMS: A comprehensive ROS was conducted with the patient and is negative except as per HPI and below:  Review of Systems  Constitutional: Negative for fever.  HENT: Negative for congestion and sore throat.   Eyes: Negative for blurred vision and pain.  Respiratory: Negative for cough and shortness of breath.   Cardiovascular: Negative for chest pain and palpitations.  Gastrointestinal: Negative for diarrhea and nausea.  Genitourinary: Positive for frequency.  Neurological: Negative for tingling and tremors.  Endo/Heme/Allergies: Positive for polydipsia.  Psychiatric/Behavioral: Negative for depression. The patient is not nervous/anxious.         DATA REVIEWED:   Results for Chelsea Moreno, Chelsea Moreno (MRN [id number]) as of 04/25/2018 15:11  Ref. Range 07/17/2017 09:29 03/24/2018 12:02  04/07/2018 11:38  Sodium Latest Ref Range: 135 - 145 mEq/L 128 (L) 128 (L) 144    ASSESSMENT/PLAN/RECOMMENDATIONS:   1. Diabetes Insipidus:   - We discussed the pathophysiology of DI  - We discussed the dangers of hyponatremia as well as hypernatremia - We discussed symptoms of hyponatremia to look for such as fatigue, nausea and vomiting and to seek immediate medical attention .  - Pt also advised to drink to thirst ONLY , to avoid future hyponatremia with current desmopressin dosing.  - Repeat sodium in a month    Medications : dDAVP 0.01 % 1 spray Q 12 hrs  I discussed the assessment and treatment plan with the patient. The patient was provided an opportunity to ask questions and all were answered. The patient agreed with the plan and demonstrated an understanding of the instructions.   The patient was advised to call back or seek an in-person evaluation if the symptoms worsen or if the condition fails to improve as anticipated.  F/u in 2 months   Signed electronically by: Mack Guise, MD  [facility] Endocrinology  [facility] [address] [place], [place] [address] Phone: [phone] FAX: [phone]   CC: Binnie Rail, MD [place] [address] Phone: [phone] Fax: [phone]   Return to Endocrinology clinic as below: Future Appointments  Date Time Provider [place]  04/25/2018  1:20 PM Ingrid Shifrin, Melanie Crazier, MD [facility] None  08/21/2018 11:00 AM Gardenia Phlegm, NP [facility] None  09/10/2018 11:15 AM Quay Burow, Claudina Lick, MD [facility]

## 2018-04-28 ENCOUNTER — Encounter: Payer: Self-pay | Admitting: *Deleted

## 2018-05-02 ENCOUNTER — Other Ambulatory Visit (INDEPENDENT_AMBULATORY_CARE_PROVIDER_SITE_OTHER): Payer: Medicare Other

## 2018-05-02 ENCOUNTER — Other Ambulatory Visit: Payer: Self-pay

## 2018-05-02 DIAGNOSIS — E232 Diabetes insipidus: Secondary | ICD-10-CM

## 2018-05-02 LAB — BASIC METABOLIC PANEL
BUN: 10 mg/dL (ref 6–23)
CO2: 31 mEq/L (ref 19–32)
Calcium: 8.8 mg/dL (ref 8.4–10.5)
Chloride: 98 mEq/L (ref 96–112)
Creatinine, Ser: 0.53 mg/dL (ref 0.40–1.20)
GFR: 114.91 mL/min (ref >60.00)
Glucose, Bld: 116 mg/dL — ABNORMAL HIGH (ref 70–99)
Potassium: 4.2 mEq/L (ref 3.5–5.1)
Sodium: 136 mEq/L (ref 135–145)

## 2018-06-16 DIAGNOSIS — H401131 Primary open-angle glaucoma, bilateral, mild stage: Secondary | ICD-10-CM | POA: Diagnosis not present

## 2018-06-25 ENCOUNTER — Encounter: Payer: Self-pay | Admitting: Internal Medicine

## 2018-06-25 ENCOUNTER — Other Ambulatory Visit: Payer: Self-pay

## 2018-06-25 ENCOUNTER — Ambulatory Visit (INDEPENDENT_AMBULATORY_CARE_PROVIDER_SITE_OTHER): Payer: Medicare Other | Admitting: Internal Medicine

## 2018-06-25 VITALS — BP 124/86 | HR 98 | Temp 98.4°F | Ht 63.0 in | Wt 212.4 lb

## 2018-06-25 DIAGNOSIS — E871 Hypo-osmolality and hyponatremia: Secondary | ICD-10-CM

## 2018-06-25 DIAGNOSIS — E232 Diabetes insipidus: Secondary | ICD-10-CM

## 2018-06-25 LAB — BASIC METABOLIC PANEL
BUN: 9 mg/dL (ref 6–23)
CO2: 30 mEq/L (ref 19–32)
Calcium: 9.4 mg/dL (ref 8.4–10.5)
Chloride: 99 mEq/L (ref 96–112)
Creatinine, Ser: 0.59 mg/dL (ref 0.40–1.20)
GFR: 101.49 mL/min (ref >60.00)
Glucose, Bld: 99 mg/dL (ref 70–99)
Potassium: 4.2 mEq/L (ref 3.5–5.1)
Sodium: 136 mEq/L (ref 135–145)

## 2018-06-25 NOTE — Progress Notes (Signed)
Name: Chelsea Moreno  MRN/ DOB: [id number], [birth year]    Age/ Sex: 68 y.o., female    PCP: Chelsea Rail, MD   Reason for Endocrinology Evaluation: Diabetes Insipidus (DI)     Date of Initial Endocrinology Evaluation: 04/25/2018    HPI: Chelsea Moreno is a 68 y.o. female with a past medical history of DI, HTN and Dyslipidemia. The patient presented for initial endocrinology clinic visit on 04/25/2018 for consultative assistance with her DI .       She has been on Desmopressin 1 spray Q12 hrs until March, 2020 when she was noted with hyponatremia ,Na 128 mEq/L, and was asked to reduce the dose to once a day but that patient felt miserable with polydipsia, drinking ~ 2 gallons of fluid at a time and polyuria with the urge to urinate every 45 minutes. Her repeat sodium was 144 mEq/L but the patient is now using desmopressin ~ every 18 hrs, the last time she used it was ~ 8:30 pm and the time prior to that was around 12:45 AM.   Prior to all this , when she was on Q12 desmopressin dosing she tends to drink water out of habit.   HISTORICAL SUMMARY: The patient was first diagnosed with DI  at age 65. She presented with polyuria and polydipsia to where she was not able to perform her daily activities or school activities, she was living in [place], [place] at the time, was evaluated at [facility] for ~ 2 weeks and was initially on injections, followed by a powder medicine but around 2010 she was started on Desmopressin 1 spray Q12 hrs.   The cause of DI is unclear but she believes this was attributed to her recurrent falls as a child.   She has no FH of DI  SUBJECTIVE:   During last visit (04/25/2018): She was having issues with hyponatremia due to excessive water intake , reduction in desmopressin dosing caused polyuria and polydipsia. She was advised to drink to thirst only and we adjusted Desmopressin dosing back to Q12 hrs.   Today (06/25/2018):  Chelsea Moreno is here for a 2 month follow up on  DI. She has been taking dDAVP twice a day but on as needed basis, she has been waiting to develop polyuria and polydipsia before she takes her next dose.  She denies any recent  nausea, dizziness or vomiting.   She does describe  episodes of waking up in the middle of the night, has chills associated with nausea, vomiting and diarrhea. This has caused loss of consciousness in the past. Last episode was 2 yrs ago.      ROS:  As per HPI.     HISTORY:  Past Medical History:  Past Medical History:  Diagnosis Date  . Breast cancer ([place]) 11/05/12   left  . Bursitis   . COLONIC POLYPS, HX OF 09/24/2008  . DEPRESSION 09/23/2007   takes Lexapro daily but hasn't taken since Sept 2014  . Diabetes insipidus ([place])    takes Desmopressin bid  . Diarrhea   . Dizziness    when nauseated gets dizzy  . Family history of ovarian cancer   . Hemorrhoids   . History of bronchitis    yrs ago  . History of migraine    last time many yrs ago  . Hx of radiation therapy 01/29/13- 03/16/13   left breast 4500 cGy 25 sessions, left breast boost 1000 cGy 5 sessions  . HYPERLIPIDEMIA  09/06/2006   takes Atorvasatin nightly  . HYPERTENSION 09/06/2006   takes Lisinopril daily  . Muscle spasms of head and/or neck   . OSTEOPOROSIS 09/24/2008   takes Actonel every 30days  . Vitamin D deficiency    takes OTC Vit D   Past Surgical History:  Past Surgical History:  Procedure Laterality Date  . BREAST BIOPSY Left 18yrs ago  . BREAST LUMPECTOMY WITH NEEDLE LOCALIZATION Left 12/16/2012   Procedure: PARTIAL MASTECTOMY WITH NEEDLE LOCALIZATION;  Surgeon: Chelsea Klein, MD;  Location: [place];  Service: General;  Laterality: Left;  1:00 NL at [place]   . COLONOSCOPY        Social History:  reports that she quit smoking about 45 years ago. Her smoking use included cigarettes. She has a 2.00 pack-year smoking history. She has never used smokeless tobacco. She reports current alcohol use. She reports that she does not use  drugs.  Family History: family history includes ALS in her brother; Alcohol abuse in her maternal grandfather; Cancer in her maternal aunt and mother; Heart attack in her father, maternal grandmother, and mother; Heart attack (age of onset: 30) in her brother; Heart attack (age of onset: 41) in her sister; Heart disease in her brother, father, mother, and sister; Hyperlipidemia in her father; Hypertension in her brother, father, and sister; Ovarian cancer in her mother; Parkinson's disease in her sister; Varicose Veins in her mother.   HOME MEDICATIONS: Allergies as of 06/25/2018   No Known Allergies     Medication List       Accurate as of June 25, 2018 11:53 AM. If you have any questions, ask your nurse or doctor.        atorvastatin 80 MG tablet Commonly known as: LIPITOR Take 1 tablet (80 mg total) by mouth daily.   desmopressin 0.01 % solution Commonly known as: DDAVP NASAL PLACE 1 SPRAY INTO EACH NOSTRIL 1 TIME A DAY   latanoprost 0.005 % ophthalmic solution Commonly known as: XALATAN Place 1 drop into both eyes at bedtime.   lisinopril 20 MG tablet Commonly known as: ZESTRIL Take 1 tablet (20 mg total) by mouth daily.   tamoxifen 20 MG tablet Commonly known as: NOLVADEX Take 1 tablet (20 mg total) by mouth daily.   timolol 0.5 % ophthalmic solution Commonly known as: BETIMOL Place 1 drop into both eyes daily.         REVIEW OF SYSTEMS: A comprehensive ROS was conducted with the patient and is negative except as per HPI and below:  Review of Systems  Constitutional: Negative for fever.  HENT: Negative for congestion and sore throat.   Respiratory: Negative for cough and shortness of breath.   Cardiovascular: Negative for chest pain and palpitations.  Gastrointestinal: Negative for diarrhea and nausea.  Genitourinary: Negative for frequency.  Endo/Heme/Allergies: Negative for polydipsia.        DATA REVIEWED:  Results for Chelsea, Moreno (MRN  [id number]) as of 06/26/2018 12:05  Ref. Range 06/25/2018 14:16  Sodium Latest Ref Range: 135 - 145 mEq/L 136  Potassium Latest Ref Range: 3.5 - 5.1 mEq/L 4.2  Chloride Latest Ref Range: 96 - 112 mEq/L 99  CO2 Latest Ref Range: 19 - 32 mEq/L 30  Glucose Latest Ref Range: 70 - 99 mg/dL 99  BUN Latest Ref Range: 6 - 23 mg/dL 9  Creatinine Latest Ref Range: 0.40 - 1.20 mg/dL 0.59  Calcium Latest Ref Range: 8.4 - 10.5 mg/dL 9.4  GFR Latest Ref Range: >60.00  mL/min 101.49    ASSESSMENT/PLAN/RECOMMENDATIONS:   1. Diabetes Insipidus:   - Pt discouraged from using dDAVP on as needed basis , she should have at least 1 dose at a fixed time (e.g bedtime) and if she wants to use the second dose on PRN that's fine, she has established DI diagnosis for years, she is going to need dDAVP. My preference is for her to take it regularly Q 12 hrs and not wait until she develops symptoms of polyuria and polydipsia.  - Pt  advised to drink to thirst ONLY , to avoid future hyponatremia with current desmopressin dosing.  - I personally can;t explain the episodes of nausea and vomiting that she gets at night every few years, I have advised her to report to the ED if this happens in the future.  - Repeat BMP today shows normal sodium but its on the lower end of normal   Medications : dDAVP 0.01 % 1 spray Q 12 hrs , may skip one DOSE a week    F/u in 6 months   Signed electronically by: Mack Guise, MD  [facility] Endocrinology  [facility] [address] [place], [place] [address] Phone: [phone] FAX: [phone]   CC: Chelsea Rail, MD [place] [address] Phone: [phone] Fax: [phone]   Return to Endocrinology clinic as below: Future Appointments  Date Time Provider [place]  06/25/2018  1:40 PM Anasophia Pecor, Melanie Crazier, MD [facility] None  08/21/2018 11:00 AM Gardenia Phlegm, NP [facility] None  09/10/2018 11:15  AM Quay Burow, Claudina Lick, MD [facility]

## 2018-06-25 NOTE — Patient Instructions (Addendum)
-   Continue dDAVP 1 spray every 12 hours - Drink liquid to thirst only  - Please report to nearest urgent care should you develop nausea, vomiting or dizziness.

## 2018-07-08 DIAGNOSIS — H401131 Primary open-angle glaucoma, bilateral, mild stage: Secondary | ICD-10-CM | POA: Diagnosis not present

## 2018-08-21 ENCOUNTER — Encounter: Payer: Medicare Other | Admitting: Adult Health

## 2018-09-09 NOTE — Progress Notes (Signed)
Subjective:    Patient ID: Chelsea Moreno, female    DOB: 1950/05/27, 68 y.o.   MRN: [id number]  HPI The patient is here for follow up.  She is not exercising regularly.     Hypertension: She is taking her medication daily. She is compliant with a low sodium diet.  She does have mild leg swelling that resolves overnight.  She denies chest pain, palpitations, shortness of breath and regular headaches.     Hyperlipidemia: She is taking her medication daily. She is compliant with a low fat/cholesterol diet. She denies myalgias.   Prediabetes:  She is compliant with a low sugar/carbohydrate diet.  She is not exercising regularly.  Right hip and leg ache:  This has been going on for a while.  She had decreased strength at one point.  She did see an orthopedic and she did do PT.  Her strength has improved with PT.  The posterior hip and right leg down to the calf still hurts.  The more active she is the more it hurts.      Medications and allergies reviewed with patient and updated if appropriate.  Patient Active Problem List   Diagnosis Date Noted  . Hyponatremia 03/30/2018  . Prediabetes 03/24/2018  . Obesity 03/24/2018  . Throat tightness 03/24/2018  . Genetic testing 02/26/2017  . Family history of ovarian cancer   . OSA (obstructive sleep apnea) 09/08/2014  . 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 Vomiting and diarrhea, episodic 07/15/2014  . Chronic venous insufficiency 06/02/2014  . Cancer of lower-inner quadrant of left female breast ([place]) 11/07/2012  . Lichen planus [id number]  . Diabetes insipidus ([place]) 03/28/2011  . Osteoporosis 09/24/2008  . Depression 09/23/2007  . Dyslipidemia 09/06/2006  . Essential hypertension 09/06/2006    Current Outpatient Medications on File Prior to Visit  Medication Sig Dispense Refill  . atorvastatin (LIPITOR) 80 MG tablet Take 1 tablet (80 mg total) by mouth daily. 90 tablet 1  . desmopressin (DDAVP NASAL) 0.01 %  solution PLACE 1 SPRAY INTO EACH NOSTRIL 1 TIME A DAY 10 mL 4  . lisinopril (PRINIVIL,ZESTRIL) 20 MG tablet Take 1 tablet (20 mg total) by mouth daily. 90 tablet 1  . timolol (BETIMOL) 0.5 % ophthalmic solution Place 1 drop into both eyes daily.     No current facility-administered medications on file prior to visit.     Past Medical History:  Diagnosis Date  . Breast cancer ([place]) 11/05/12   left  . Bursitis   . COLONIC POLYPS, HX OF 09/24/2008  . DEPRESSION 09/23/2007   takes Lexapro daily but hasn't taken since Sept 2014  . Diabetes insipidus ([place])    takes Desmopressin bid  . Diarrhea   . Dizziness    when nauseated gets dizzy  . Family history of ovarian cancer   . Hemorrhoids   . History of bronchitis    yrs ago  . History of migraine    last time many yrs ago  . Hx of radiation therapy 01/29/13- 03/16/13   left breast 4500 cGy 25 sessions, left breast boost 1000 cGy 5 sessions  . HYPERLIPIDEMIA 09/06/2006   takes Atorvasatin nightly  . HYPERTENSION 09/06/2006   takes Lisinopril daily  . Muscle spasms of head and/or neck   . OSTEOPOROSIS 09/24/2008   takes Actonel every 30days  . Vitamin D deficiency    takes OTC Vit D    Past Surgical History:  Procedure Laterality Date  .  BREAST BIOPSY Left 28yrs ago  . BREAST LUMPECTOMY WITH NEEDLE LOCALIZATION Left 12/16/2012   Procedure: PARTIAL MASTECTOMY WITH NEEDLE LOCALIZATION;  Surgeon: Stark Klein, MD;  Location: [place];  Service: General;  Laterality: Left;  1:00 NL at [place]   . COLONOSCOPY      Social History   Socioeconomic History  . Marital status: Widowed    Spouse name: Not on file  . Number of children: Not on file  . Years of education: Not on file  . Highest education level: Not on file  Occupational History  . Occupation: Professor at [employer]  . Financial resource strain: Not on file  . Food insecurity    Worry: Not on file    Inability: Not on file  . Transportation needs    Medical: Not on  file    Non-medical: Not on file  Tobacco Use  . Smoking status: Former Smoker    Packs/day: 0.50    Years: 4.00    Pack years: 2.00    Types: Cigarettes    Quit date: 01/07/1973    Years since quitting: 45.7  . Smokeless tobacco: Never Used  . Tobacco comment: quit 53yrs ago  Substance and Sexual Activity  . Alcohol use: Yes    Alcohol/week: 0.0 standard drinks    Comment: socailly  . Drug use: No  . Sexual activity: Not Currently    Birth control/protection: Post-menopausal    Comment: menarche age 17, first live birth age 71, menopause 2002, no HRT  Lifestyle  . Physical activity    Days per week: Not on file    Minutes per session: Not on file  . Stress: Not on file  Relationships  . Social Herbalist on phone: Not on file    Gets together: Not on file    Attends religious service: Not on file    Active member of club or organization: Not on file    Attends meetings of clubs or organizations: Not on file    Relationship status: Not on file  Other Topics Concern  . Not on file  Social History Narrative  . Not on file    Family History  Problem Relation Age of Onset  . Ovarian cancer Mother   . Cancer Mother        Ovarian  . Heart disease Mother        Before age 75  . Heart attack Mother   . Varicose Veins Mother   . Heart disease Father        Before age 78  . Hyperlipidemia Father   . Hypertension Father   . Heart attack Father   . Heart disease Sister        Before age 75  . Hypertension Sister   . Heart attack Sister 24       second heart attack at 63  . Heart disease Brother        Before age 37  . Hypertension Brother   . Heart attack Brother 88  . Cancer Maternal Aunt        GYN cancer  . Heart attack Maternal Grandmother   . Alcohol abuse Maternal Grandfather   . Parkinson's disease Sister   . ALS Brother     Review of Systems  Constitutional: Negative for chills and fever.  Respiratory: Negative for cough, shortness of  breath and wheezing.   Cardiovascular: Positive for leg swelling (mild). Negative for chest pain and  palpitations.  Musculoskeletal: Positive for arthralgias and myalgias. Negative for back pain.  Neurological: Negative for light-headedness, numbness and headaches.       Objective:   Vitals:   09/10/18 1116  BP: 140/74  Pulse: 67  Resp: 16  Temp: 97.9 F (36.6 C)  SpO2: 99%   BP Readings from Last 3 Encounters:  09/10/18 140/74  06/25/18 124/86  03/24/18 (!) 150/76   Wt Readings from Last 3 Encounters:  09/10/18 218 lb (98.9 kg)  06/25/18 212 lb 6.4 oz (96.3 kg)  03/24/18 213 lb 1.9 oz (96.7 kg)   Body mass index is 38.62 kg/m.   Physical Exam    Constitutional: Appears well-developed and well-nourished. No distress.  HENT:  Head: Normocephalic and atraumatic.  Neck: Neck supple. No tracheal deviation present. No thyromegaly present.  No cervical lymphadenopathy Cardiovascular: Normal rate, regular rhythm and normal heart sounds.   No murmur heard. No carotid bruit .  No edema Pulmonary/Chest: Effort normal and breath sounds normal. No respiratory distress. No has no wheezes. No rales.  Skin: Skin is warm and dry. Not diaphoretic.  Psychiatric: Normal mood and affect. Behavior is normal.      Assessment & Plan:    See Problem List for Assessment and Plan of chronic medical problems.

## 2018-09-10 ENCOUNTER — Encounter: Payer: Self-pay | Admitting: Internal Medicine

## 2018-09-10 ENCOUNTER — Other Ambulatory Visit: Payer: Self-pay

## 2018-09-10 ENCOUNTER — Ambulatory Visit (INDEPENDENT_AMBULATORY_CARE_PROVIDER_SITE_OTHER): Payer: Medicare Other | Admitting: Internal Medicine

## 2018-09-10 VITALS — BP 140/74 | HR 67 | Temp 97.9°F | Resp 16 | Ht 63.0 in | Wt 218.0 lb

## 2018-09-10 DIAGNOSIS — Z23 Encounter for immunization: Secondary | ICD-10-CM

## 2018-09-10 DIAGNOSIS — R7303 Prediabetes: Secondary | ICD-10-CM

## 2018-09-10 DIAGNOSIS — I10 Essential (primary) hypertension: Secondary | ICD-10-CM | POA: Diagnosis not present

## 2018-09-10 DIAGNOSIS — E785 Hyperlipidemia, unspecified: Secondary | ICD-10-CM | POA: Diagnosis not present

## 2018-09-10 DIAGNOSIS — E232 Diabetes insipidus: Secondary | ICD-10-CM | POA: Diagnosis not present

## 2018-09-10 NOTE — Patient Instructions (Addendum)
No blood work today.  Flu and pneumonia immunizations administered today.     Medications reviewed and updated.  Changes include :  none    Please followup in 6 months

## 2018-09-10 NOTE — Assessment & Plan Note (Signed)
Continue statin Encouraged regular exercise, healthy diet and weight loss

## 2018-09-10 NOTE — Assessment & Plan Note (Signed)
Blood pressure borderline high We will continue current medication Stressed the importance of starting regular exercise, eating better and working on weight loss Had recent BMP so we will hold off on blood work

## 2018-09-10 NOTE — Assessment & Plan Note (Signed)
Following with endocrine-management per Dr. Sarina Ser

## 2018-09-10 NOTE — Assessment & Plan Note (Signed)
Stressed the importance of starting regular exercise, watching the sugars and carbohydrates and working on weight loss We will hold off on checking A1c until next visit

## 2018-09-24 ENCOUNTER — Telehealth: Payer: Self-pay | Admitting: Adult Health

## 2018-09-24 NOTE — Telephone Encounter (Signed)
Vonore PAL 9/28. Moved appointments from 9/28 to 9/30. Confirmed with patient. Per patient cannot come 9/30. Moved to 10/1 per patient. Confirmed with patient.

## 2018-10-06 ENCOUNTER — Encounter: Payer: Medicare Other | Admitting: Adult Health

## 2018-10-08 ENCOUNTER — Encounter: Payer: Medicare Other | Admitting: Adult Health

## 2018-10-08 NOTE — Progress Notes (Signed)
CLINIC:  Survivorship   REASON FOR VISIT:  Routine follow-up for history of breast cancer.   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  The Hereditary Gene Panel offered by [facility] includes sequencing and/or deletion duplication testing of the following 47 genes: APC, ATM, AXIN2, BARD1, BMPR1A, BRCA1, BRCA2, BRIP1, CDH1, CDK4, CDKN2A (p14ARF), CDKN2A (p16INK4a), CHEK2, CTNNA1, DICER1, EPCAM (Deletion/duplication testing only), GREM1 (promoter region deletion/duplication testing only), KIT, MEN1, MLH1, MSH2, MSH3, MSH6, MUTYH, NBN, NF1, NHTL1, PALB2, PDGFRA, PMS2, POLD1, POLE, PTEN, RAD50, RAD51C, RAD51D, SDHB, SDHC, SDHD, SMAD4, SMARCA4. STK11, TP53, TSC1, TSC2, and VHL.  The following genes were evaluated for sequence changes only: SDHA and HOXB13 c.251G>A variant only. The report date is February 19, 2017.      INTERVAL HISTORY:  Chelsea Moreno presents to the Survivorship Clinic today for routine follow-up for her history of breast cancer.  Overall, she reports feeling quite well. She sees her PCP regularly and is up to date with her mammogram, colonoscopy, GYN exams.  She does need skin checks regularly.    Chelsea Moreno has had some right ankle and calf pain x 6 months.  She completed Tamoxifen about 6 months ago.  She also has varicosities.  She has not had any evaluation of this.    Kiyoko does not exercise.  She has started doing yoga weekly.  She is otherwise doing well.    REVIEW OF SYSTEMS:  Review of  Systems  Constitutional: Negative for appetite change, chills, fatigue, fever and unexpected weight change.  HENT:   Negative for hearing loss, lump/mass, mouth sores and trouble swallowing.   Eyes: Negative for eye problems and icterus.  Respiratory: Negative for chest tightness, cough and shortness of breath.   Cardiovascular: Negative for chest pain, leg swelling and palpitations.  Gastrointestinal: Negative for abdominal distention, abdominal pain, diarrhea, nausea and vomiting.  Endocrine: Negative for hot flashes.  Genitourinary: Negative for difficulty urinating.   Musculoskeletal: Negative for arthralgias.  Skin: Negative for itching and rash.  Neurological: Negative for dizziness, extremity weakness, headaches and numbness.  Hematological: Negative for adenopathy. Does not bruise/bleed easily.  Psychiatric/Behavioral: Negative for depression. The patient is not nervous/anxious.   Breast: Denies any new nodularity, masses, tenderness, nipple changes, or nipple discharge.       PAST MEDICAL/SURGICAL HISTORY:  Past Medical History:  Diagnosis Date  . Breast cancer ([place]) 11/05/12   left  . Bursitis   . COLONIC POLYPS, HX OF 09/24/2008  . DEPRESSION 09/23/2007   takes Lexapro daily but hasn't taken since Sept 2014  . Diabetes insipidus ([place])    takes Desmopressin bid  . Diarrhea   . Dizziness    when nauseated gets dizzy  . Family history of ovarian cancer   . Hemorrhoids   . History of bronchitis    yrs ago  . History of migraine    last time many yrs ago  . Hx of radiation therapy 01/29/13- 03/16/13   left breast 4500 cGy 25 sessions, left breast  boost 1000 cGy 5 sessions  . HYPERLIPIDEMIA 09/06/2006   takes Atorvasatin nightly  . HYPERTENSION 09/06/2006   takes Lisinopril daily  . Muscle spasms of head and/or neck   . OSTEOPOROSIS 09/24/2008   takes Actonel every 30days  . Vitamin D deficiency    takes OTC Vit D   Past Surgical History:  Procedure Laterality Date   . BREAST BIOPSY Left 33yr ago  . BREAST LUMPECTOMY WITH NEEDLE LOCALIZATION Left 12/16/2012   Procedure: PARTIAL MASTECTOMY WITH NEEDLE LOCALIZATION;  Surgeon: FStark Klein MD;  [facility]  Service: General;  Laterality: [facility];  [facility]  . COLONOSCOPY       ALLERGIES:  No Known Allergies   CURRENT MEDICATIONS:  Outpatient Encounter Medications as of 10/09/2018  Medication Sig  . atorvastatin (LIPITOR) 80 MG tablet Take 1 tablet (80 mg total) by mouth daily.  .Marland Kitchendesmopressin (DDAVP NASAL) 0.01 % solution PLACE 1 SPRAY INTO EACH NOSTRIL 1 TIME A DAY  . lisinopril (PRINIVIL,ZESTRIL) 20 MG tablet Take 1 tablet (20 mg total) by mouth daily.  . timolol (BETIMOL) 0.5 % ophthalmic solution Place 1 drop into both eyes daily.  .Marland KitchenVYZULTA 0.024 % SOLN INSTILL 1 DROP INTO EACH EYE ONCE DAILY AT BEDTIME   No facility-administered encounter medications on file as of 10/09/2018.      ONCOLOGIC FAMILY HISTORY:  Family History  Problem Relation Age of Onset  . Ovarian cancer Mother   . Cancer Mother        Ovarian  . Heart disease Mother        Before age 68 . Heart attack Mother   . Varicose Veins Mother   . Heart disease Father        Before age 68 . Hyperlipidemia Father   . Hypertension Father   . Heart attack Father   . Heart disease Sister        Before age [id number] . Hypertension Sister   . Heart attack Sister 511      second heart attack at 523 . Heart disease Brother        Before age 68 . Hypertension Brother   . Heart attack Brother 420 . Cancer Maternal Aunt        GYN cancer  . Heart attack Maternal Grandmother   . Alcohol abuse Maternal Grandfather   . Parkinson's disease Sister   . ALS Brother     GENETIC COUNSELING/TESTING: See above  SOCIAL HISTORY:  Social History   Socioeconomic History  . Marital status: Widowed    Spouse name: Not on file  . Number of children: Not on file  . Years of education: Not on file  . Highest education level:  Not on file  Occupational History  . Occupation: Professor at [employer] . Financial resource strain: Not on file  . Food insecurity    Worry: Not on file    Inability: Not on file  . Transportation needs    Medical: Not on file    Non-medical: Not on file  Tobacco Use  . Smoking status: Former Smoker    Packs/day: 0.50    Years: 4.00    Pack years: 2.00    Types: Cigarettes    Quit date: 01/07/1973    Years since quitting: 45.7  . Smokeless tobacco: Never Used  . Tobacco comment: quit 462yrago  Substance and Sexual Activity  . Alcohol use:  Yes    Alcohol/week: 0.0 standard drinks    Comment: socailly  . Drug use: No  . Sexual activity: Not Currently    Birth control/protection: Post-menopausal    Comment: menarche age 70, first live birth age 57, menopause 2002, no HRT  Lifestyle  . Physical activity    Days per week: Not on file    Minutes per session: Not on file  . Stress: Not on file  Relationships  . Social Herbalist on phone: Not on file    Gets together: Not on file    Attends religious service: Not on file    Active member of club or organization: Not on file    Attends meetings of clubs or organizations: Not on file    Relationship status: Not on file  . Intimate partner violence    Fear of current or ex partner: Not on file    Emotionally abused: Not on file    Physically abused: Not on file    Forced sexual activity: Not on file  Other Topics Concern  . Not on file  Social History Narrative  . Not on file      PHYSICAL EXAMINATION:  Vital Signs: Vitals:   10/09/18 0827 10/09/18 0828  BP: (!) 157/68 (!) 160/68  Pulse: 70   Resp: 14   Temp: (!) 97.1 F (36.2 C)   SpO2: 99%    Filed Weights   10/09/18 0827  Weight: 216 lb 9.6 oz (98.2 kg)   General: Well-nourished, well-appearing female in no acute distress.  Unaccompanied today.   HEENT: Head is normocephalic.  Pupils equal and reactive to light. Conjunctivae clear  without exudate.  Sclerae anicteric. Oral mucosa is pink, moist.  Oropharynx is pink without lesions or erythema.  Lymph: No cervical, supraclavicular, or infraclavicular lymphadenopathy noted on palpation.  Cardiovascular: Regular rate and rhythm.Marland Kitchen Respiratory: Clear to auscultation bilaterally. Chest expansion symmetric; breathing non-labored.  Breast Exam:  -Left breast: No appreciable masses on palpation. No skin redness, thickening, or peau d'orange appearance; no nipple retraction or nipple discharge; mild distortion in symmetry at previous lumpectomy site well healed scar without erythema or nodularity.  -Right breast: No appreciable masses on palpation. No skin redness, thickening, or peau d'orange appearance; no nipple retraction or nipple discharge. -Axilla: No axillary adenopathy bilaterally.  GI: Abdomen soft and round; non-tender, non-distended. Bowel sounds normoactive. No hepatosplenomegaly.   GU: Deferred.  Neuro: No focal deficits. Steady gait.  Psych: Mood and affect normal and appropriate for situation.  MSK: No focal spinal tenderness to palpation, full range of motion in bilateral upper extremities Extremities: No edema. Skin: Warm and dry.  LABORATORY DATA:  None for this visit   DIAGNOSTIC IMAGING:  Most recent mammogram:  Normal, breast density A.  Done 11/06/2017 at [place], not scanned yet    ASSESSMENT AND PLAN:  Ms.. Moreno is a pleasant 68 y.o. female with history of Stage 0 left breast DCIS, ER+/PR+, diagnosed in 10/2012, treated with lumpectomy, adjuvant radiation therapy, and anti-estrogen therapy with Tamoxifen beginning in 03/2013 for 5 years.  She presents to the Survivorship Clinic for surveillance and routine follow-up.   1. History of breast cancer:  Chelsea Moreno is currently clinically and radiographically without evidence of disease or recurrence of breast cancer. She will be due for mammogram in 10/2018; orders placed today.  She would like to continue  to see [place] on an annual basis, so I requested an appointment for her to f/u  with [place] next year.  I encouraged her to call me with any questions or concerns before her next visit at the cancer center, and I would be happy to see her sooner, if needed.    2. Right ankle and calf pain: I placed orders for a doppler to r/o DVT.  She is sedentary, has varicosities, and stopped tamoxifen a few months ago.  3. Bone health:  Given Chelsea Moreno's age/history of breast cancer, she is at risk for bone demineralization. Her last DEXA scan was in 2018 and showed osteopenia with a T score of -1.1.    She was given education on specific food and activities to promote bone health.  4. Cancer screening:  Due to Chelsea Moreno history and her age, she should receive screening for skin cancers, colon cancer, and gynecologic cancers. She was encouraged to follow-up with her PCP for appropriate cancer screenings.   5. Health maintenance and wellness promotion: Chelsea Moreno was encouraged to consume 5-7 servings of fruits and vegetables per day. She was also encouraged to engage in moderate to vigorous exercise for 30 minutes per day most days of the week. She was instructed to limit her alcohol consumption and continue to abstain from tobacco use.    Dispo:  -Return to cancer center in one year for LTS f/u -Mammogram in 10/2018   A total of (30) minutes of face-to-face time was spent with this patient with greater than 50% of that time in counseling and care-coordination.   Gardenia Phlegm, NP Survivorship Program [facility] [phone]   Note: PRIMARY CARE PROVIDER Binnie Rail, [place] [phone]

## 2018-10-09 ENCOUNTER — Other Ambulatory Visit: Payer: Self-pay

## 2018-10-09 ENCOUNTER — Encounter: Payer: Self-pay | Admitting: Adult Health

## 2018-10-09 ENCOUNTER — Telehealth: Payer: Self-pay | Admitting: Adult Health

## 2018-10-09 ENCOUNTER — Inpatient Hospital Stay: Payer: Medicare Other | Attending: Adult Health | Admitting: Adult Health

## 2018-10-09 VITALS — BP 160/68 | HR 70 | Temp 97.1°F | Resp 14 | Ht 63.0 in | Wt 216.6 lb

## 2018-10-09 DIAGNOSIS — Z923 Personal history of irradiation: Secondary | ICD-10-CM | POA: Diagnosis not present

## 2018-10-09 DIAGNOSIS — Z853 Personal history of malignant neoplasm of breast: Secondary | ICD-10-CM | POA: Diagnosis not present

## 2018-10-09 DIAGNOSIS — E785 Hyperlipidemia, unspecified: Secondary | ICD-10-CM | POA: Insufficient documentation

## 2018-10-09 DIAGNOSIS — Z8041 Family history of malignant neoplasm of ovary: Secondary | ICD-10-CM | POA: Diagnosis not present

## 2018-10-09 DIAGNOSIS — M79669 Pain in unspecified lower leg: Secondary | ICD-10-CM | POA: Insufficient documentation

## 2018-10-09 DIAGNOSIS — I10 Essential (primary) hypertension: Secondary | ICD-10-CM | POA: Insufficient documentation

## 2018-10-09 DIAGNOSIS — C50312 Malignant neoplasm of lower-inner quadrant of left female breast: Secondary | ICD-10-CM

## 2018-10-09 DIAGNOSIS — Z87891 Personal history of nicotine dependence: Secondary | ICD-10-CM | POA: Diagnosis not present

## 2018-10-09 DIAGNOSIS — Z79899 Other long term (current) drug therapy: Secondary | ICD-10-CM | POA: Insufficient documentation

## 2018-10-09 DIAGNOSIS — F329 Major depressive disorder, single episode, unspecified: Secondary | ICD-10-CM | POA: Insufficient documentation

## 2018-10-09 DIAGNOSIS — Z170 Estrogen receptor positive status [ER+]: Secondary | ICD-10-CM | POA: Diagnosis not present

## 2018-10-09 NOTE — Telephone Encounter (Signed)
I talk with patient regarding schedule  

## 2018-10-10 ENCOUNTER — Telehealth: Payer: Self-pay

## 2018-10-10 ENCOUNTER — Ambulatory Visit (HOSPITAL_COMMUNITY)
Admission: RE | Admit: 2018-10-10 | Discharge: 2018-10-10 | Disposition: A | Discharge status: Home / Self Care | Payer: Medicare Other | Source: Ambulatory Visit | Attending: Adult Health | Admitting: Adult Health

## 2018-10-10 DIAGNOSIS — C50312 Malignant neoplasm of lower-inner quadrant of left female breast: Secondary | ICD-10-CM | POA: Diagnosis not present

## 2018-10-10 DIAGNOSIS — Z170 Estrogen receptor positive status [ER+]: Secondary | ICD-10-CM

## 2018-10-10 DIAGNOSIS — M79604 Pain in right leg: Secondary | ICD-10-CM

## 2018-10-10 NOTE — Progress Notes (Signed)
Right lower extremity venous duplex has been completed. Preliminary results can be found in CV Proc through chart review.  Results were given to Kathlee Nations at [facility] office.  10/10/18 9:18 AM Carlos Levering RVT

## 2018-10-10 NOTE — Telephone Encounter (Signed)
RN spoke with Vascular Ultrasound.   Report negative for DVT to RLE.    RN will notify Wilber Bihari, NP.

## 2018-10-13 DIAGNOSIS — Z853 Personal history of malignant neoplasm of breast: Secondary | ICD-10-CM | POA: Diagnosis not present

## 2018-10-13 DIAGNOSIS — Z1231 Encounter for screening mammogram for malignant neoplasm of breast: Secondary | ICD-10-CM | POA: Diagnosis not present

## 2018-10-13 LAB — HM MAMMOGRAPHY

## 2018-11-03 ENCOUNTER — Other Ambulatory Visit: Payer: Self-pay | Admitting: Internal Medicine

## 2018-11-24 ENCOUNTER — Other Ambulatory Visit: Payer: Self-pay | Admitting: Internal Medicine

## 2019-01-23 DIAGNOSIS — H401131 Primary open-angle glaucoma, bilateral, mild stage: Secondary | ICD-10-CM | POA: Diagnosis not present

## 2019-01-23 DIAGNOSIS — H52203 Unspecified astigmatism, bilateral: Secondary | ICD-10-CM | POA: Diagnosis not present

## 2019-02-15 ENCOUNTER — Ambulatory Visit: Payer: Medicare Other | Attending: Internal Medicine

## 2019-02-15 DIAGNOSIS — Z23 Encounter for immunization: Secondary | ICD-10-CM | POA: Insufficient documentation

## 2019-02-15 NOTE — Progress Notes (Signed)
   Covid-19 Vaccination Clinic  Name:  Chelsea Moreno    MRN: [id number] DOB: 04/10/1950  02/15/2019  [name] was observed post Covid-19 immunization for 15 minutes without incidence. She was provided with Vaccine Information Sheet and instruction to access the V-Safe system.   [name] was instructed to call 911 with any severe reactions post vaccine: Marland Kitchen Difficulty breathing  . Swelling of your face and throat  . A fast heartbeat  . A bad rash all over your body  . Dizziness and weakness    Immunizations Administered    Name Date Dose VIS Date Route   Pfizer COVID-19 Vaccine 02/15/2019  4:21 PM 0.3 mL 12/19/2018 Intramuscular   Manufacturer: [place]   Lot: [id number]   [place]: [id number]

## 2019-02-18 DIAGNOSIS — M8589 Other specified disorders of bone density and structure, multiple sites: Secondary | ICD-10-CM | POA: Diagnosis not present

## 2019-02-18 LAB — HM DEXA SCAN

## 2019-02-23 ENCOUNTER — Other Ambulatory Visit: Payer: Self-pay | Admitting: Internal Medicine

## 2019-03-04 ENCOUNTER — Ambulatory Visit: Payer: Medicare Other

## 2019-03-09 ENCOUNTER — Encounter: Payer: Self-pay | Admitting: Internal Medicine

## 2019-03-11 ENCOUNTER — Ambulatory Visit: Payer: Medicare Other

## 2019-03-11 ENCOUNTER — Ambulatory Visit: Payer: Medicare Other | Attending: Internal Medicine

## 2019-03-11 DIAGNOSIS — Z23 Encounter for immunization: Secondary | ICD-10-CM

## 2019-03-11 NOTE — Progress Notes (Signed)
   Covid-19 Vaccination Clinic  Name:  RAIZY VADER    MRN: [id number] DOB: [birth year]  03/11/2019  Ms. Yturralde was observed post Covid-19 immunization for 15 minutes without incident. She was provided with Vaccine Information Sheet and instruction to access the V-Safe system.   Ms. Mcleland was instructed to call 911 with any severe reactions post vaccine: Marland Kitchen Difficulty breathing  . Swelling of face and throat  . A fast heartbeat  . A bad rash all over body  . Dizziness and weakness   Immunizations Administered    Name Date Dose VIS Date Route   Pfizer COVID-19 Vaccine 03/11/2019  4:06 PM 0.3 mL 12/19/2018 Intramuscular   Manufacturer: [place]   Lot: [id number]   [place]: [id number]

## 2019-03-24 NOTE — Progress Notes (Signed)
Subjective:    Patient ID: Chelsea Moreno, female    DOB: [birth year], 69 y.o.   MRN: [id number]  HPI The patient is here for follow up of their chronic medical problems, including hypertension, prediabetes, dyslipidemia  Had a recent episode of nausea, vomiting and diarrhea.  She has not had one of these episodes in 2 years.  She did not faint.  She starts with bloating, chills/cool feeling in back.  She gets nauseous and gets diarrhea.  She managed not to throw up because she knew she would pass out if she did.  She felt lightheaded/dizzy.     When she turns to the right she gets disorientated, lightheaded.  She feels this when she is laying down as well.     She is doing some yoga.    Throbbing ache in ankle on right when sitting - last 10 mn to 30 min.  Started in left leg as well.  when legs up or done.  No blood clot - done by onc.   Goes away if gets up and walks.      Medications and allergies reviewed with patient and updated if appropriate.  Patient Active Problem List   Diagnosis Date Noted  . Dizziness 03/25/2019  . Hyponatremia 03/30/2018  . Prediabetes 03/24/2018  . Obesity 03/24/2018  . Throat tightness 03/24/2018  . Genetic testing 02/26/2017  . Family history of ovarian cancer   . OSA (obstructive sleep apnea) 09/08/2014  . 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 Vomiting and diarrhea, episodic 07/15/2014  . Chronic venous insufficiency 06/02/2014  . Cancer of lower-inner quadrant of left female breast ([place]) 11/07/2012  . Lichen planus [id number]  . 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 Osteopenia 09/24/2008  . Depression 09/23/2007  . Dyslipidemia 09/06/2006  . Essential hypertension 09/06/2006    Current Outpatient Medications on File Prior to Visit  Medication Sig Dispense Refill  . atorvastatin (LIPITOR) 80 MG tablet Take 1 tablet by mouth once daily 90 tablet 1  . desmopressin (DDAVP NASAL) 0.01 % solution PLACE 1 SPRAY INTO EACH  NOSTRIL 1 TIME A DAY 10 mL 4  . lisinopril (ZESTRIL) 20 MG tablet Take 1 tablet by mouth once daily 90 tablet 0  . timolol (BETIMOL) 0.5 % ophthalmic solution Place 1 drop into both eyes daily.    Marland Kitchen VYZULTA 0.024 % SOLN INSTILL 1 DROP INTO EACH EYE ONCE DAILY AT BEDTIME     No current facility-administered medications on file prior to visit.    Past Medical History:  Diagnosis Date  . Breast cancer ([place]) 11/05/12   left  . Bursitis   . COLONIC POLYPS, HX OF 09/24/2008  . DEPRESSION 09/23/2007   takes Lexapro daily but hasn't taken since Sept 2014  . Diabetes insipidus ([place])    takes Desmopressin bid  . Diarrhea   . Dizziness    when nauseated gets dizzy  . Family history of ovarian cancer   . Hemorrhoids   . History of bronchitis    yrs ago  . History of migraine    last time many yrs ago  . Hx of radiation therapy 01/29/13- 03/16/13   left breast 4500 cGy 25 sessions, left breast boost 1000 cGy 5 sessions  . HYPERLIPIDEMIA 09/06/2006   takes Atorvasatin nightly  . HYPERTENSION 09/06/2006   takes Lisinopril daily  . Muscle spasms of head and/or neck   . OSTEOPOROSIS 09/24/2008   takes Actonel every 30days  . Vitamin  D deficiency    takes OTC Vit D    Past Surgical History:  Procedure Laterality Date  . BREAST BIOPSY Left 57yrs ago  . BREAST LUMPECTOMY WITH NEEDLE LOCALIZATION Left 12/16/2012   Procedure: PARTIAL MASTECTOMY WITH NEEDLE LOCALIZATION;  Surgeon: Stark Klein, MD;  Location: [facility];  Service: General;  Laterality: Left;  1:00 NL at [place]   . COLONOSCOPY      Social History   Socioeconomic History  . Marital status: Widowed    Spouse name: Not on file  . Number of children: Not on file  . Years of education: Not on file  . Highest education level: Not on file  Occupational History  . Occupation: Professor at [employer]  . Smoking status: Former Smoker    Packs/day: 0.50    Years: 4.00    Pack years: 2.00    Types: Cigarettes    Quit date:  01/07/1973    Years since quitting: 46.2  . Smokeless tobacco: Never Used  . Tobacco comment: quit 91yrs ago  Substance and Sexual Activity  . Alcohol use: Yes    Alcohol/week: 0.0 standard drinks    Comment: socailly  . Drug use: No  . Sexual activity: Not Currently    Birth control/protection: Post-menopausal    Comment: menarche age 83, first live birth age 89, menopause 2002, no HRT  Other Topics Concern  . Not on file  Social History Narrative  . Not on file   Social Determinants of Health   Financial Resource Strain:   . Difficulty of Paying Living Expenses:   Food Insecurity:   . Worried About Charity fundraiser in the Last Year:   . Arboriculturist in the Last Year:   Transportation Needs:   . Film/video editor (Medical):   Marland Kitchen Lack of Transportation (Non-Medical):   Physical Activity:   . Days of Exercise per Week:   . Minutes of Exercise per Session:   Stress:   . Feeling of Stress :   Social Connections:   . Frequency of Communication with Friends and Family:   . Frequency of Social Gatherings with Friends and Family:   . Attends Religious Services:   . Active Member of Clubs or Organizations:   . Attends Archivist Meetings:   Marland Kitchen Marital Status:     Family History  Problem Relation Age of Onset  . Ovarian cancer Mother   . Cancer Mother        Ovarian  . Heart disease Mother        Before age 19  . Heart attack Mother   . Varicose Veins Mother   . Heart disease Father        Before age 39  . Hyperlipidemia Father   . Hypertension Father   . Heart attack Father   . Heart disease Sister        Before age 40  . Hypertension Sister   . Heart attack Sister 17       second heart attack at 78  . Heart disease Brother        Before age 39  . Hypertension Brother   . Heart attack Brother 67  . Cancer Maternal Aunt        GYN cancer  . Heart attack Maternal Grandmother   . Alcohol abuse Maternal Grandfather   . Parkinson's disease  Sister   . ALS Brother     Review of Systems  Constitutional: Negative for chills and fever.  Respiratory: Negative for cough, shortness of breath and wheezing.   Cardiovascular: Positive for leg swelling (at end of day). Negative for chest pain and palpitations.  Neurological: Positive for headaches (rarely).       Objective:   Vitals:   03/25/19 1051  BP: 140/84  Pulse: 72  Resp: 16  Temp: 98.3 F (36.8 C)  SpO2: 98%   BP Readings from Last 3 Encounters:  03/25/19 140/84  10/09/18 (!) 160/68  09/10/18 140/74   Wt Readings from Last 3 Encounters:  03/25/19 216 lb 6.4 oz (98.2 kg)  10/09/18 216 lb 9.6 oz (98.2 kg)  09/10/18 218 lb (98.9 kg)   Body mass index is 38.33 kg/m.   Physical Exam    Constitutional: Appears well-developed and well-nourished. No distress.  HENT:  Head: Normocephalic and atraumatic.  Neck: Neck supple. No tracheal deviation present. No thyromegaly present.  No cervical lymphadenopathy Cardiovascular: Normal rate, regular rhythm and normal heart sounds.   No murmur heard. No carotid bruit .  Mild bilateral lower extremity edema Pulmonary/Chest: Effort normal and breath sounds normal. No respiratory distress. No has no wheezes. No rales. MSK: No tenderness with palpation bilateral ankles or lower anterior legs were aching is., No calf tenderness Skin: Skin is warm and dry. Not diaphoretic.  Psychiatric: Normal mood and affect. Behavior is normal.      Assessment & Plan:    See Problem List for Assessment and Plan of chronic medical problems.    This visit occurred during the SARS-CoV-2 public health emergency.  Safety protocols were in place, including screening questions prior to the visit, additional usage of staff PPE, and extensive cleaning of exam room while observing appropriate contact time as indicated for disinfecting solutions.

## 2019-03-24 NOTE — Patient Instructions (Addendum)
Vitamin D 1000-2000 units a day.  Calcium 600 mg twice a day with food.    Blood work was ordered.     Medications reviewed and updated.  Changes include :   zofran - anti-nausea medication as needed.  Your prescription(s) have been submitted to your pharmacy. Please take as directed and contact our office if you believe you are having problem(s) with the medication(s).    Please followup in 6 months

## 2019-03-25 ENCOUNTER — Ambulatory Visit (INDEPENDENT_AMBULATORY_CARE_PROVIDER_SITE_OTHER): Payer: Medicare Other | Admitting: Internal Medicine

## 2019-03-25 ENCOUNTER — Other Ambulatory Visit: Payer: Self-pay

## 2019-03-25 ENCOUNTER — Encounter: Payer: Self-pay | Admitting: Internal Medicine

## 2019-03-25 VITALS — BP 140/84 | HR 72 | Temp 98.3°F | Resp 16 | Ht 63.0 in | Wt 216.4 lb

## 2019-03-25 DIAGNOSIS — R1110 Vomiting, unspecified: Secondary | ICD-10-CM | POA: Diagnosis not present

## 2019-03-25 DIAGNOSIS — E232 Diabetes insipidus: Secondary | ICD-10-CM | POA: Diagnosis not present

## 2019-03-25 DIAGNOSIS — R197 Diarrhea, unspecified: Secondary | ICD-10-CM | POA: Diagnosis not present

## 2019-03-25 DIAGNOSIS — R7303 Prediabetes: Secondary | ICD-10-CM | POA: Diagnosis not present

## 2019-03-25 DIAGNOSIS — R42 Dizziness and giddiness: Secondary | ICD-10-CM

## 2019-03-25 DIAGNOSIS — E785 Hyperlipidemia, unspecified: Secondary | ICD-10-CM | POA: Diagnosis not present

## 2019-03-25 DIAGNOSIS — I10 Essential (primary) hypertension: Secondary | ICD-10-CM

## 2019-03-25 LAB — COMPREHENSIVE METABOLIC PANEL
ALT: 28 U/L (ref 0–35)
AST: 23 U/L (ref 0–37)
Albumin: 4.2 g/dL (ref 3.5–5.2)
Alkaline Phosphatase: 92 U/L (ref 39–117)
BUN: 15 mg/dL (ref 6–23)
CO2: 29 mEq/L (ref 19–32)
Calcium: 9.2 mg/dL (ref 8.4–10.5)
Chloride: 95 mEq/L — ABNORMAL LOW (ref 96–112)
Creatinine, Ser: 0.54 mg/dL (ref 0.40–1.20)
GFR: 112.16 mL/min (ref >60.00)
Glucose, Bld: 110 mg/dL — ABNORMAL HIGH (ref 70–99)
Potassium: 3.8 mEq/L (ref 3.5–5.1)
Sodium: 131 mEq/L — ABNORMAL LOW (ref 135–145)
Total Bilirubin: 0.9 mg/dL (ref 0.2–1.2)
Total Protein: 7 g/dL (ref 6.0–8.3)

## 2019-03-25 LAB — LIPID PANEL
Cholesterol: 202 mg/dL — ABNORMAL HIGH (ref 0–200)
HDL: 37.5 mg/dL — ABNORMAL LOW (ref >39.00)
LDL Cholesterol: 125 mg/dL — ABNORMAL HIGH (ref 0–99)
NonHDL: 164.89
Total CHOL/HDL Ratio: 5
Triglycerides: 197 mg/dL — ABNORMAL HIGH (ref 0.0–149.0)
VLDL: 39.4 mg/dL (ref 0.0–40.0)

## 2019-03-25 LAB — HEMOGLOBIN A1C: Hgb A1c MFr Bld: 6.1 % (ref 4.6–6.5)

## 2019-03-25 MED ORDER — ONDANSETRON 4 MG PO TBDP: 4.0000 mg | ORAL_TABLET | Freq: Three times a day (TID) | ORAL | 0 refills | Status: DC | PRN

## 2019-03-25 MED ORDER — DESMOPRESSIN ACETATE SPRAY 0.01 % NA SOLN: NASAL | 4 refills | Status: DC

## 2019-03-25 NOTE — Assessment & Plan Note (Signed)
She did have an episode recently and has been 2 years since her last episode Had nausea, diarrhea, lightheadedness.  Was able to avoid vomiting and therefore did not pass out Woke her up in the middle of the night and it typically occurs at night Has had full evaluation in the past so we will hold off on referral at this time I will prescribe her Zofran to have on hand in case something like this happens again She also takes Pepto and that typically helps if she can get it down

## 2019-03-25 NOTE — Assessment & Plan Note (Signed)
Chronic BP well controlled Current regimen effective and well tolerated Continue current medications at current doses cmp  

## 2019-03-25 NOTE — Assessment & Plan Note (Signed)
Following with endocrine annually Fairly stable on desmopressin nasal spray twice daily-needs a refill-sent to pharmacy

## 2019-03-25 NOTE — Assessment & Plan Note (Addendum)
3-4 months with turning head to right ? BPPV Discussed PT-not interested at this time Advised that if this persists she should let me know and I can refer her to ENT to have this further evaluated to confirm BPPV

## 2019-03-25 NOTE — Assessment & Plan Note (Signed)
Chronic Check a1c Low sugar / carb diet Stressed regular exercise  

## 2019-03-25 NOTE — Assessment & Plan Note (Signed)
Chronic Check lipid panel  Continue daily statin Regular exercise and healthy diet encouraged  

## 2019-03-26 ENCOUNTER — Encounter: Payer: Self-pay | Admitting: Internal Medicine

## 2019-05-28 ENCOUNTER — Other Ambulatory Visit: Payer: Self-pay | Admitting: Internal Medicine

## 2019-06-24 NOTE — Progress Notes (Signed)
Name: Chelsea Moreno  MRN/ DOB: [id number], 12/26/1950    Age/ Sex: 69 y.o., female    PCP: Binnie Rail, MD   Reason for Endocrinology Evaluation: Diabetes Insipidus (DI)     Date of Initial Endocrinology Evaluation: 04/25/2018    HPI: Chelsea Moreno is a 69 y.o. female with a past medical history of DI, HTN and Dyslipidemia. The patient presented for initial endocrinology clinic visit on 04/25/2018 for consultative assistance with her DI .     HISTORICAL SUMMARY: The patient was first diagnosed with DI  at age 59. She presented with polyuria and polydipsia to where she was not able to perform her daily activities or school activities, she was living in [place], [place] at the time, was evaluated at [facility] for ~ 2 weeks and was initially on injections, followed by a powder medicine but around 2010 she was started on Desmopressin 1 spray Q12 hrs.   The cause of DI is unclear but she believes this was attributed to her recurrent falls as a child.   She has been on Desmopressin 1 spray Q12 hrs until March, 2020 when she was noted with hyponatremia ,Na 128 mEq/L, and was asked to reduce the dose to once a day but that patient felt miserable with polydipsia, drinking ~ 2 gallons of fluid at a time and polyuria with the urge to urinate every 45 minutes. Her repeat sodium was 144 mEq/L but the patient is now using desmopressin ~ every 18 hrs, the last time she used it was ~ 8:30 pm and the time prior to that was around 12:45 AM.   Prior to all this , when she was on Q12 desmopressin dosing she tends to drink water out of habit.     She has no FH of DI  SUBJECTIVE:    Today (06/25/2018):  Chelsea Moreno is here for a follow up on DI. She has been taking dDAVP  Q 12 hrs.     She does describe  episodes of waking up in the middle of the night, has chills associated with nausea, vomiting and diarrhea. This has happened  2x since her last visit. Has been having this during the day as well.   Zofran has been helping      ROS:  As per HPI.     HISTORY:  Past Medical History:  Past Medical History:  Diagnosis Date  . Breast cancer ([place]) 11/05/12   left  . Bursitis   . COLONIC POLYPS, HX OF 09/24/2008  . DEPRESSION 09/23/2007   takes Lexapro daily but hasn't taken since Sept 2014  . Diabetes insipidus ([place])    takes Desmopressin bid  . Diarrhea   . Dizziness    when nauseated gets dizzy  . Family history of ovarian cancer   . Hemorrhoids   . History of bronchitis    yrs ago  . History of migraine    last time many yrs ago  . Hx of radiation therapy 01/29/13- 03/16/13   left breast 4500 cGy 25 sessions, left breast boost 1000 cGy 5 sessions  . HYPERLIPIDEMIA 09/06/2006   takes Atorvasatin nightly  . HYPERTENSION 09/06/2006   takes Lisinopril daily  . Muscle spasms of head and/or neck   . OSTEOPOROSIS 09/24/2008   takes Actonel every 30days  . Vitamin D deficiency    takes OTC Vit D   Past Surgical History:  Past Surgical History:  Procedure Laterality Date  . BREAST BIOPSY  Left 48yrs ago  . BREAST LUMPECTOMY WITH NEEDLE LOCALIZATION Left 12/16/2012   Procedure: PARTIAL MASTECTOMY WITH NEEDLE LOCALIZATION;  Surgeon: Stark Klein, MD;  Location: [place];  Service: General;  Laterality: Left;  1:00 NL at [place]   . COLONOSCOPY        Social History:  reports that she quit smoking about 46 years ago. Her smoking use included cigarettes. She has a 2.00 pack-year smoking history. She has never used smokeless tobacco. She reports current alcohol use. She reports that she does not use drugs.  Family History: family history includes ALS in her brother; Alcohol abuse in her maternal grandfather; Cancer in her maternal aunt and mother; Heart attack in her father, maternal grandmother, and mother; Heart attack (age of onset: 58) in her brother; Heart attack (age of onset: 28) in her sister; Heart disease in her brother, father, mother, and sister; Hyperlipidemia in her  father; Hypertension in her brother, father, and sister; Ovarian cancer in her mother; Parkinson's disease in her sister; Varicose Veins in her mother.   HOME MEDICATIONS: Allergies as of 06/25/2019   No Known Allergies     Medication List       Accurate as of June 24, 2019  9:38 AM. If you have any questions, ask your nurse or doctor.        atorvastatin 80 MG tablet Commonly known as: LIPITOR Take 1 tablet by mouth once daily   desmopressin 0.01 % solution Commonly known as: DDAVP NASAL PLACE 1 SPRAY INTO EACH NOSTRIL 2 TIMES A DAY   lisinopril 20 MG tablet Commonly known as: ZESTRIL Take 1 tablet by mouth once daily   ondansetron 4 MG disintegrating tablet Commonly known as: Zofran ODT Take 1 tablet (4 mg total) by mouth every 8 (eight) hours as needed for nausea or vomiting.   timolol 0.5 % ophthalmic solution Commonly known as: BETIMOL Place 1 drop into both eyes daily.   Vyzulta 0.024 % Soln Generic drug: Latanoprostene Bunod INSTILL 1 DROP INTO EACH EYE ONCE DAILY AT BEDTIME           DATA REVIEWED:  Results for CISSY, GALBREATH (MRN [id number]) as of 06/25/2019 12:51  Ref. Range 03/25/2019 12:08  Sodium Latest Ref Range: 135 - 145 mEq/L 131 (L)  Potassium Latest Ref Range: 3.5 - 5.1 mEq/L 3.8  Chloride Latest Ref Range: 96 - 112 mEq/L 95 (L)  CO2 Latest Ref Range: 19 - 32 mEq/L 29  Glucose Latest Ref Range: 70 - 99 mg/dL 110 (H)  BUN Latest Ref Range: 6 - 23 mg/dL 15  Creatinine Latest Ref Range: 0.40 - 1.20 mg/dL 0.54  Calcium Latest Ref Range: 8.4 - 10.5 mg/dL 9.2  Alkaline Phosphatase Latest Ref Range: 39 - 117 U/L 92  Albumin Latest Ref Range: 3.5 - 5.2 g/dL 4.2  AST Latest Ref Range: 0 - 37 U/L 23  ALT Latest Ref Range: 0 - 35 U/L 28  Total Protein Latest Ref Range: 6.0 - 8.3 g/dL 7.0  Total Bilirubin Latest Ref Range: 0.2 - 1.2 mg/dL 0.9  GFR Latest Ref Range: >60.00 mL/min 112.16     ASSESSMENT/PLAN/RECOMMENDATIONS:   1. Diabetes  Insipidus:   -  BMP shows low sodium , this explained her recent symptoms of nausea and vomiting etc  - Pt  advised to drink to thirst ONLY , to avoid future hyponatremia with current desmopressin dosing.  - I am going to adjust her dDAVP as below   Medications : dDAVP  0.01 % 1 spray Q 12 hrs on Saturday , Monday,Wednesday and Friday  dDAVP 0.01 % 1 spray daily Sunday, Tuesday and Thursday    Labs in 1 week     F/u in 6 months   Signed electronically by: Mack Guise, MD  [facility]  [address] [place], [place] [address] Phone: [phone] FAX: [phone]   CC: Binnie Rail, MD [place] [address] Phone: [phone] Fax: [phone]   Return to Endocrinology clinic as below: Future Appointments  Date Time Provider [place]  06/25/2019 10:50 AM Jhett Fretwell, Melanie Crazier, MD [facility] None  09/28/2019 11:00 AM Binnie Rail, MD [facility] None  10/19/2019 11:00 AM Causey, Charlestine Massed, NP [facility] None

## 2019-06-25 ENCOUNTER — Ambulatory Visit (INDEPENDENT_AMBULATORY_CARE_PROVIDER_SITE_OTHER): Payer: Medicare Other | Admitting: Internal Medicine

## 2019-06-25 ENCOUNTER — Other Ambulatory Visit: Payer: Self-pay

## 2019-06-25 ENCOUNTER — Encounter: Payer: Self-pay | Admitting: Internal Medicine

## 2019-06-25 VITALS — BP 142/88 | HR 89 | Ht 63.0 in | Wt 218.6 lb

## 2019-06-25 DIAGNOSIS — E232 Diabetes insipidus: Secondary | ICD-10-CM

## 2019-06-25 LAB — BASIC METABOLIC PANEL
BUN: 10 mg/dL (ref 6–23)
CO2: 32 mEq/L (ref 19–32)
Calcium: 9.1 mg/dL (ref 8.4–10.5)
Chloride: 94 mEq/L — ABNORMAL LOW (ref 96–112)
Creatinine, Ser: 0.53 mg/dL (ref 0.40–1.20)
GFR: 114.52 mL/min (ref >60.00)
Glucose, Bld: 120 mg/dL — ABNORMAL HIGH (ref 70–99)
Potassium: 4.2 mEq/L (ref 3.5–5.1)
Sodium: 130 mEq/L — ABNORMAL LOW (ref 135–145)

## 2019-06-25 NOTE — Patient Instructions (Signed)
-   Continue dDAVP 1 spray every 12 hours - Drink liquid to thirst only

## 2019-06-26 ENCOUNTER — Ambulatory Visit: Payer: Medicare Other | Admitting: Internal Medicine

## 2019-07-06 ENCOUNTER — Other Ambulatory Visit (INDEPENDENT_AMBULATORY_CARE_PROVIDER_SITE_OTHER): Payer: Medicare Other

## 2019-07-06 ENCOUNTER — Other Ambulatory Visit: Payer: Self-pay

## 2019-07-06 DIAGNOSIS — E232 Diabetes insipidus: Secondary | ICD-10-CM

## 2019-07-06 LAB — BASIC METABOLIC PANEL
BUN: 12 mg/dL (ref 6–23)
CO2: 29 mEq/L (ref 19–32)
Calcium: 9.4 mg/dL (ref 8.4–10.5)
Chloride: 101 mEq/L (ref 96–112)
Creatinine, Ser: 0.7 mg/dL (ref 0.40–1.20)
GFR: 83.06 mL/min (ref >60.00)
Glucose, Bld: 145 mg/dL — ABNORMAL HIGH (ref 70–99)
Potassium: 4.1 mEq/L (ref 3.5–5.1)
Sodium: 138 mEq/L (ref 135–145)

## 2019-09-04 ENCOUNTER — Other Ambulatory Visit: Payer: Self-pay | Admitting: Internal Medicine

## 2019-09-26 NOTE — Patient Instructions (Addendum)
  Blood work was ordered.     Medications reviewed and updated.  Changes include :   none  Your prescription(s) have been submitted to your pharmacy. Please take as directed and contact our office if you believe you are having problem(s) with the medication(s).   Please followup in 6 months   

## 2019-09-26 NOTE — Progress Notes (Signed)
Subjective:    Patient ID: Chelsea Moreno, female    DOB: Jan 27, 1950, 69 y.o.   MRN: [id number]  HPI The patient is here for follow up of their chronic medical problems, including htn, prediabetes, dyslipidemia  She is taking all of her medications as prescribed.   She is doing some yoga.  She does this once a week.  She has had some recent adjustments to her diabetes insipidus medication and that is affecting her quality of life.  She has had less nausea/vomiting episodes, which could be related to the low sodium-her adjustment of her medications for her DI may have helped     Medications and allergies reviewed with patient and updated if appropriate.  Patient Active Problem List   Diagnosis Date Noted  . Dizziness 03/25/2019  . Hyponatremia 03/30/2018  . Prediabetes 03/24/2018  . Obesity 03/24/2018  . Throat tightness 03/24/2018  . Genetic testing 02/26/2017  . Family history of ovarian cancer   . OSA (obstructive sleep apnea) 09/08/2014  . 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 Vomiting and diarrhea, episodic 07/15/2014  . Chronic venous insufficiency 06/02/2014  . Cancer of lower-inner quadrant of left female breast ([place]) 11/07/2012  . Lichen planus 11/57/2620  . Diabetes insipidus ([place]) 03/28/2011  . Osteopenia 09/24/2008  . Depression 09/23/2007  . Dyslipidemia 09/06/2006  . Essential hypertension 09/06/2006    Current Outpatient Medications on File Prior to Visit  Medication Sig Dispense Refill  . atorvastatin (LIPITOR) 80 MG tablet Take 1 tablet by mouth once daily 90 tablet 1  . desmopressin (DDAVP NASAL) 0.01 % solution PLACE 1 SPRAY INTO EACH NOSTRIL 2 TIMES A DAY 10 mL 4  . lisinopril (ZESTRIL) 20 MG tablet Take 1 tablet (20 mg total) by mouth daily. Follow-up appt due in Sept must see provider for future refills 30 tablet 0  . ondansetron (ZOFRAN ODT) 4 MG disintegrating tablet Take 1 tablet (4 mg total) by mouth every 8 (eight) hours as  needed for nausea or vomiting. 20 tablet 0  . timolol (BETIMOL) 0.5 % ophthalmic solution Place 1 drop into both eyes daily.    Marland Kitchen VYZULTA 0.024 % SOLN INSTILL 1 DROP INTO EACH EYE ONCE DAILY AT BEDTIME     No current facility-administered medications on file prior to visit.    Past Medical History:  Diagnosis Date  . Breast cancer ([place]) 11/05/12   left  . Bursitis   . COLONIC POLYPS, HX OF 09/24/2008  . DEPRESSION 09/23/2007   takes Lexapro daily but hasn't taken since Sept 2014  . Diabetes insipidus ([place])    takes Desmopressin bid  . Diarrhea   . Dizziness    when nauseated gets dizzy  . Family history of ovarian cancer   . Hemorrhoids   . History of bronchitis    yrs ago  . History of migraine    last time many yrs ago  . Hx of radiation therapy 01/29/13- 03/16/13   left breast 4500 cGy 25 sessions, left breast boost 1000 cGy 5 sessions  . HYPERLIPIDEMIA 09/06/2006   takes Atorvasatin nightly  . HYPERTENSION 09/06/2006   takes Lisinopril daily  . Muscle spasms of head and/or neck   . OSTEOPOROSIS 09/24/2008   takes Actonel every 30days  . Vitamin D deficiency    takes OTC Vit D    Past Surgical History:  Procedure Laterality Date  . BREAST BIOPSY Left 21yrs ago  . BREAST LUMPECTOMY WITH NEEDLE LOCALIZATION Left 12/16/2012  Procedure: PARTIAL MASTECTOMY WITH NEEDLE LOCALIZATION;  Surgeon: Stark Klein, MD;  Location: [place];  Service: General;  Laterality: Left;  1:00 NL at [place]   . COLONOSCOPY      Social History   Socioeconomic History  . Marital status: Widowed    Spouse name: Not on file  . Number of children: Not on file  . Years of education: Not on file  . Highest education level: Not on file  Occupational History  . Occupation: Professor at [employer]  . Smoking status: Former Smoker    Packs/day: 0.50    Years: 4.00    Pack years: 2.00    Types: Cigarettes    Quit date: 01/07/1973    Years since quitting: 46.7  . Smokeless tobacco: Never Used    . Tobacco comment: quit 42yrs ago  Substance and Sexual Activity  . Alcohol use: Yes    Alcohol/week: 0.0 standard drinks    Comment: socailly  . Drug use: No  . Sexual activity: Not Currently    Birth control/protection: Post-menopausal    Comment: menarche age 56, first live birth age 52, menopause 2002, no HRT  Other Topics Concern  . Not on file  Social History Narrative  . Not on file   Social Determinants of Health   Financial Resource Strain:   . Difficulty of Paying Living Expenses: Not on file  Food Insecurity:   . Worried About Charity fundraiser in the Last Year: Not on file  . Ran Out of Food in the Last Year: Not on file  Transportation Needs:   . Lack of Transportation (Medical): Not on file  . Lack of Transportation (Non-Medical): Not on file  Physical Activity:   . Days of Exercise per Week: Not on file  . Minutes of Exercise per Session: Not on file  Stress:   . Feeling of Stress : Not on file  Social Connections:   . Frequency of Communication with Friends and Family: Not on file  . Frequency of Social Gatherings with Friends and Family: Not on file  . Attends Religious Services: Not on file  . Active Member of Clubs or Organizations: Not on file  . Attends Archivist Meetings: Not on file  . Marital Status: Not on file    Family History  Problem Relation Age of Onset  . Ovarian cancer Mother   . Cancer Mother        Ovarian  . Heart disease Mother        Before age 72  . Heart attack Mother   . Varicose Veins Mother   . Heart disease Father        Before age 28  . Hyperlipidemia Father   . Hypertension Father   . Heart attack Father   . Heart disease Sister        Before age 69  . Hypertension Sister   . Heart attack Sister 95       second heart attack at 53  . Heart disease Brother        Before age 35  . Hypertension Brother   . Heart attack Brother 47  . Cancer Maternal Aunt        GYN cancer  . Heart attack Maternal  Grandmother   . Alcohol abuse Maternal Grandfather   . Parkinson's disease Sister   . ALS Brother     Review of Systems  Constitutional: Negative for chills and fever.  HENT:  Positive for postnasal drip. Negative for congestion, sinus pain and sore throat.        Left ear stopped up x 1 mo, occ dull ache, occ pops loud when she lays on that side  Respiratory: Positive for cough (from PND). Negative for shortness of breath and wheezing.   Cardiovascular: Negative for chest pain, palpitations and leg swelling.  Neurological: Negative for light-headedness and headaches.       Objective:   Vitals:   09/28/19 1104  BP: 130/78  Pulse: 63  Temp: 98.1 F (36.7 C)  SpO2: 97%   BP Readings from Last 3 Encounters:  09/28/19 130/78  06/25/19 (!) 142/88  03/25/19 140/84   Wt Readings from Last 3 Encounters:  09/28/19 213 lb 9.6 oz (96.9 kg)  06/25/19 218 lb 9.6 oz (99.2 kg)  03/25/19 216 lb 6.4 oz (98.2 kg)   Body mass index is 37.84 kg/m.   Physical Exam    Constitutional: Appears well-developed and well-nourished. No distress.  HENT:  Head: Normocephalic and atraumatic.  Neck: Neck supple. No tracheal deviation present. No thyromegaly present.  No cervical lymphadenopathy Cardiovascular: Normal rate, regular rhythm and normal heart sounds.   No murmur heard. No carotid bruit .  No edema Pulmonary/Chest: Effort normal and breath sounds normal. No respiratory distress. No has no wheezes. No rales.  Skin: Skin is warm and dry. Not diaphoretic.  Psychiatric: Normal mood and affect. Behavior is normal.      Assessment & Plan:    See Problem List for Assessment and Plan of chronic medical problems.    This visit occurred during the SARS-CoV-2 public health emergency.  Safety protocols were in place, including screening questions prior to the visit, additional usage of staff PPE, and extensive cleaning of exam room while observing appropriate contact time as indicated for  disinfecting solutions.

## 2019-09-28 ENCOUNTER — Ambulatory Visit (INDEPENDENT_AMBULATORY_CARE_PROVIDER_SITE_OTHER): Payer: Medicare Other | Admitting: Internal Medicine

## 2019-09-28 ENCOUNTER — Encounter: Payer: Self-pay | Admitting: Internal Medicine

## 2019-09-28 ENCOUNTER — Other Ambulatory Visit: Payer: Self-pay

## 2019-09-28 VITALS — BP 130/78 | HR 63 | Temp 98.1°F | Wt 213.6 lb

## 2019-09-28 DIAGNOSIS — E785 Hyperlipidemia, unspecified: Secondary | ICD-10-CM | POA: Diagnosis not present

## 2019-09-28 DIAGNOSIS — R7303 Prediabetes: Secondary | ICD-10-CM | POA: Diagnosis not present

## 2019-09-28 DIAGNOSIS — R197 Diarrhea, unspecified: Secondary | ICD-10-CM

## 2019-09-28 DIAGNOSIS — I10 Essential (primary) hypertension: Secondary | ICD-10-CM | POA: Diagnosis not present

## 2019-09-28 DIAGNOSIS — Z23 Encounter for immunization: Secondary | ICD-10-CM

## 2019-09-28 DIAGNOSIS — R1110 Vomiting, unspecified: Secondary | ICD-10-CM

## 2019-09-28 MED ORDER — LISINOPRIL 20 MG PO TABS: 20.0000 mg | ORAL_TABLET | Freq: Every day | ORAL | 1 refills | Status: DC

## 2019-09-28 MED ORDER — ONDANSETRON 4 MG PO TBDP: 4.0000 mg | ORAL_TABLET | Freq: Three times a day (TID) | ORAL | 0 refills | Status: DC | PRN

## 2019-09-28 NOTE — Assessment & Plan Note (Signed)
Chronic, intermittent ?  Related to hyponatremia DDAVP adjusted by endocrinology and hopefully that will help Zofran as needed

## 2019-09-28 NOTE — Assessment & Plan Note (Signed)
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

## 2019-09-28 NOTE — Assessment & Plan Note (Signed)
Chronic BP well controlled Current regimen effective and well tolerated Continue current medications at current doses cmp  

## 2019-09-28 NOTE — Assessment & Plan Note (Signed)
Chronic Check a1c Low sugar / carb diet Stressed regular exercise  

## 2019-09-28 NOTE — Assessment & Plan Note (Signed)
Chronic Check lipid panel  Continue daily statin Regular exercise and healthy diet encouraged  

## 2019-09-29 LAB — COMPLETE METABOLIC PANEL WITH GFR
AG Ratio: 1.8 (calc) (ref 1.0–2.5)
ALT: 38 U/L — ABNORMAL HIGH (ref 6–29)
AST: 28 U/L (ref 10–35)
Albumin: 4.7 g/dL (ref 3.6–5.1)
Alkaline phosphatase (APISO): 96 U/L (ref 37–153)
BUN: 14 mg/dL (ref 7–25)
CO2: 30 mmol/L (ref 20–32)
Calcium: 9.8 mg/dL (ref 8.6–10.4)
Chloride: 100 mmol/L (ref 98–110)
Creat: 0.69 mg/dL (ref 0.50–0.99)
GFR, Est African American: 104 mL/min/{1.73_m2} (ref >=60)
GFR, Est Non African American: 89 mL/min/{1.73_m2} (ref >=60)
Globulin: 2.6 g/dL (calc) (ref 1.9–3.7)
Glucose, Bld: 112 mg/dL — ABNORMAL HIGH (ref 65–99)
Potassium: 4.5 mmol/L (ref 3.5–5.3)
Sodium: 139 mmol/L (ref 135–146)
Total Bilirubin: 0.7 mg/dL (ref 0.2–1.2)
Total Protein: 7.3 g/dL (ref 6.1–8.1)

## 2019-09-29 LAB — LIPID PANEL
Cholesterol: 238 mg/dL — ABNORMAL HIGH (ref <200)
HDL: 44 mg/dL — ABNORMAL LOW (ref >=50)
LDL Cholesterol (Calc): 155 mg/dL (calc) — ABNORMAL HIGH
Non-HDL Cholesterol (Calc): 194 mg/dL (calc) — ABNORMAL HIGH (ref <130)
Total CHOL/HDL Ratio: 5.4 (calc) — ABNORMAL HIGH (ref <5.0)
Triglycerides: 218 mg/dL — ABNORMAL HIGH (ref <150)

## 2019-09-29 LAB — HEMOGLOBIN A1C
Hgb A1c MFr Bld: 5.9 % of total Hgb — ABNORMAL HIGH (ref <5.7)
Mean Plasma Glucose: 123 (calc)
eAG (mmol/L): 6.8 (calc)

## 2019-10-19 ENCOUNTER — Encounter: Payer: Medicare Other | Admitting: Adult Health

## 2019-10-19 DIAGNOSIS — Z23 Encounter for immunization: Secondary | ICD-10-CM | POA: Diagnosis not present

## 2019-10-23 ENCOUNTER — Encounter: Payer: Self-pay | Admitting: Internal Medicine

## 2019-10-23 DIAGNOSIS — Z1231 Encounter for screening mammogram for malignant neoplasm of breast: Secondary | ICD-10-CM | POA: Diagnosis not present

## 2019-10-23 LAB — HM MAMMOGRAPHY

## 2019-11-06 ENCOUNTER — Other Ambulatory Visit: Payer: Self-pay

## 2019-11-06 ENCOUNTER — Encounter: Payer: Self-pay | Admitting: Adult Health

## 2019-11-06 ENCOUNTER — Inpatient Hospital Stay: Payer: Medicare Other | Attending: Adult Health | Admitting: Adult Health

## 2019-11-06 VITALS — BP 144/63 | HR 65 | Temp 98.1°F | Resp 20 | Ht 63.0 in | Wt 214.4 lb

## 2019-11-06 DIAGNOSIS — Z8249 Family history of ischemic heart disease and other diseases of the circulatory system: Secondary | ICD-10-CM | POA: Insufficient documentation

## 2019-11-06 DIAGNOSIS — Z853 Personal history of malignant neoplasm of breast: Secondary | ICD-10-CM | POA: Insufficient documentation

## 2019-11-06 DIAGNOSIS — Z170 Estrogen receptor positive status [ER+]: Secondary | ICD-10-CM | POA: Diagnosis not present

## 2019-11-06 DIAGNOSIS — E785 Hyperlipidemia, unspecified: Secondary | ICD-10-CM | POA: Insufficient documentation

## 2019-11-06 DIAGNOSIS — Z79899 Other long term (current) drug therapy: Secondary | ICD-10-CM | POA: Diagnosis not present

## 2019-11-06 DIAGNOSIS — C50312 Malignant neoplasm of lower-inner quadrant of left female breast: Secondary | ICD-10-CM | POA: Diagnosis not present

## 2019-11-06 DIAGNOSIS — Z87891 Personal history of nicotine dependence: Secondary | ICD-10-CM | POA: Insufficient documentation

## 2019-11-06 DIAGNOSIS — Z923 Personal history of irradiation: Secondary | ICD-10-CM | POA: Diagnosis not present

## 2019-11-06 DIAGNOSIS — Z8041 Family history of malignant neoplasm of ovary: Secondary | ICD-10-CM | POA: Diagnosis not present

## 2019-11-06 DIAGNOSIS — I10 Essential (primary) hypertension: Secondary | ICD-10-CM | POA: Diagnosis not present

## 2019-11-06 DIAGNOSIS — Z8349 Family history of other endocrine, nutritional and metabolic diseases: Secondary | ICD-10-CM | POA: Insufficient documentation

## 2019-11-06 NOTE — Progress Notes (Signed)
CLINIC:  Survivorship   REASON FOR VISIT:  Routine follow-up for history of breast cancer.   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  The Hereditary Gene Panel offered by [facility] includes sequencing and/or deletion duplication testing of the following 47 genes: APC, ATM, AXIN2, BARD1, BMPR1A, BRCA1, BRCA2, BRIP1, CDH1, CDK4, CDKN2A (p14ARF), CDKN2A (p16INK4a), CHEK2, CTNNA1, DICER1, EPCAM (Deletion/duplication testing only), GREM1 (promoter region deletion/duplication testing only), KIT, MEN1, MLH1, MSH2, MSH3, MSH6, MUTYH, NBN, NF1, NHTL1, PALB2, PDGFRA, PMS2, POLD1, POLE, PTEN, RAD50, RAD51C, RAD51D, SDHB, SDHC, SDHD, SMAD4, SMARCA4. STK11, TP53, TSC1, TSC2, and VHL.  The following genes were evaluated for sequence changes only: SDHA and HOXB13 c.251G>A variant only. The report date is February 19, 2017.      INTERVAL HISTORY:  Ms. Espinola presents to the Survivorship Clinic today for routine follow-up for her history of breast cancer.  Overall, she reports feeling quite well. She sees her PCP regularly and is up to date with her mammogram, colonoscopy, GYN exams.  She underwent bone desntiy testing on 02/18/2019 that showed osteopenia with a t score of -1.6 in the spine.  She underwent her mammogram last week that showed no evidence of malignancy.  She was relieved with these results.  She overall is feeling well and does yoga once per week for exercise.    REVIEW OF SYSTEMS:  Review of Systems    Constitutional: Negative for appetite change, chills, fatigue, fever and unexpected weight change.  HENT:   Negative for hearing loss, lump/mass, mouth sores and trouble swallowing.   Eyes: Negative for eye problems and icterus.  Respiratory: Negative for chest tightness, cough and shortness of breath.   Cardiovascular: Negative for chest pain, leg swelling and palpitations.  Gastrointestinal: Negative for abdominal distention, abdominal pain, diarrhea, nausea and vomiting.  Endocrine: Negative for hot flashes.  Genitourinary: Negative for difficulty urinating.   Musculoskeletal: Negative for arthralgias.  Skin: Negative for itching and rash.  Neurological: Negative for dizziness, extremity weakness, headaches and numbness.  Hematological: Negative for adenopathy. Does not bruise/bleed easily.  Psychiatric/Behavioral: Negative for depression. The patient is not nervous/anxious.   Breast: Denies any new nodularity, masses, tenderness, nipple changes, or nipple discharge.       PAST MEDICAL/SURGICAL HISTORY:  Past Medical History:  Diagnosis Date   Breast cancer ([place]) 11/05/12   left   Bursitis    COLONIC POLYPS, HX OF 09/24/2008   DEPRESSION 09/23/2007   takes Lexapro daily but hasn't taken since Sept 2014   Diabetes insipidus ([place])    takes Desmopressin bid   Diarrhea    Dizziness    when nauseated gets dizzy   Family history of ovarian cancer    Hemorrhoids    History of bronchitis    yrs ago   History of migraine    last time many yrs ago   Hx of radiation therapy 01/29/13- 03/16/13   left breast 4500 cGy 25 sessions, left breast boost 1000 cGy 5 sessions   HYPERLIPIDEMIA 09/06/2006  takes Atorvasatin nightly   HYPERTENSION 09/06/2006   takes Lisinopril daily   Muscle spasms of head and/or neck    OSTEOPOROSIS 09/24/2008   takes Actonel every 30days   Vitamin D deficiency    takes OTC Vit D   Past Surgical History:  Procedure Laterality Date    BREAST BIOPSY Left 31yrs ago   BREAST LUMPECTOMY WITH NEEDLE LOCALIZATION Left 12/16/2012   Procedure: PARTIAL MASTECTOMY WITH NEEDLE LOCALIZATION;  Surgeon: Almond Lint, MD;  Location: [facility];  Service: General;  Laterality: Left;  1:00 NL at [facility]    COLONOSCOPY       ALLERGIES:  No Known Allergies   CURRENT MEDICATIONS:  Outpatient Encounter Medications as of 11/06/2019  Medication Sig   atorvastatin (LIPITOR) 80 MG tablet Take 1 tablet by mouth once daily   desmopressin (DDAVP NASAL) 0.01 % solution PLACE 1 SPRAY INTO EACH NOSTRIL 2 TIMES A DAY   lisinopril (ZESTRIL) 20 MG tablet Take 1 tablet (20 mg total) by mouth daily.   ondansetron (ZOFRAN ODT) 4 MG disintegrating tablet Take 1 tablet (4 mg total) by mouth every 8 (eight) hours as needed for nausea or vomiting.   timolol (BETIMOL) 0.5 % ophthalmic solution Place 1 drop into both eyes daily.   VYZULTA 0.024 % SOLN INSTILL 1 DROP INTO EACH EYE ONCE DAILY AT BEDTIME   No facility-administered encounter medications on file as of 11/06/2019.     ONCOLOGIC FAMILY HISTORY:  Family History  Problem Relation Age of Onset   Ovarian cancer Mother    Cancer Mother        Ovarian   Heart disease Mother        Before age 14   Heart attack Mother    Varicose Veins Mother    Heart disease Father        Before age 1   Hyperlipidemia Father    Hypertension Father    Heart attack Father    Heart disease Sister        Before age 83   Hypertension Sister    Heart attack Sister 57       second heart attack at 24   Heart disease Brother        Before age 17   Hypertension Brother    Heart attack Brother 34   Cancer Maternal Aunt        GYN cancer   Heart attack Maternal Grandmother    Alcohol abuse Maternal Grandfather    Parkinson's disease Sister    ALS Brother     GENETIC COUNSELING/TESTING: See above  SOCIAL HISTORY:  Social History   Socioeconomic History   Marital status: Widowed     Spouse name: Not on file   Number of children: Not on file   Years of education: Not on file   Highest education level: Not on file  Occupational History   Occupation: Professor at [employer]  Tobacco Use   Smoking status: Former Smoker    Packs/day: 0.50    Years: 4.00    Pack years: 2.00    Types: Cigarettes    Quit date: 01/07/1973    Years since quitting: 46.8   Smokeless tobacco: Never Used   Tobacco comment: quit 18yrs ago  Substance and Sexual Activity   Alcohol use: Yes    Alcohol/week: 0.0 standard drinks    Comment: socailly   Drug use: No   Sexual activity: Not Currently    Birth control/protection: Post-menopausal    Comment:  menarche age 58, first live birth age 51, menopause 2002, no HRT  Other Topics Concern   Not on file  Social History Narrative   Not on file   Social Determinants of Health   Financial Resource Strain:    Difficulty of Paying Living Expenses: Not on file  Food Insecurity:    Worried About Charity fundraiser in the Last Year: Not on file   [employer] of Food in the Last Year: Not on file  Transportation Needs:    Lack of Transportation (Medical): Not on file   Lack of Transportation (Non-Medical): Not on file  Physical Activity:    Days of Exercise per Week: Not on file   Minutes of Exercise per Session: Not on file  Stress:    Feeling of Stress : Not on file  Social Connections:    Frequency of Communication with Friends and Family: Not on file   Frequency of Social Gatherings with Friends and Family: Not on file   Attends Religious Services: Not on file   Active Member of Clubs or Organizations: Not on file   Attends Archivist Meetings: Not on file   Marital Status: Not on file  Intimate Partner Violence:    Fear of Current or Ex-Partner: Not on file   Emotionally Abused: Not on file   Physically Abused: Not on file   Sexually Abused: Not on file      PHYSICAL EXAMINATION:  Vital  Signs: Vitals:   11/06/19 1011  BP: (!) 144/63  Pulse: 65  Resp: 20  Temp: 98.1 F (36.7 C)  SpO2: 96%   Filed Weights   11/06/19 1011  Weight: 214 lb 6.4 oz (97.3 kg)   General: Well-nourished, well-appearing female in no acute distress.  Unaccompanied today.   HEENT: Head is normocephalic.  Pupils equal and reactive to light. Conjunctivae clear without exudate.  Sclerae anicteric. Oral mucosa is pink, moist.  Oropharynx is pink without lesions or erythema.  Lymph: No cervical, supraclavicular, or infraclavicular lymphadenopathy noted on palpation.  Cardiovascular: Regular rate and rhythm.Marland Kitchen Respiratory: Clear to auscultation bilaterally. Chest expansion symmetric; breathing non-labored.  Breast Exam:  -Left breast: No appreciable masses on palpation. No skin redness, thickening, or peau d'orange appearance; no nipple retraction or nipple discharge; mild distortion in symmetry at previous lumpectomy site well healed scar without erythema or nodularity.  -Right breast: No appreciable masses on palpation. No skin redness, thickening, or peau d'orange appearance; no nipple retraction or nipple discharge. -Axilla: No axillary adenopathy bilaterally.  GI: Abdomen soft and round; non-tender, non-distended. Bowel sounds normoactive. No hepatosplenomegaly.   GU: Deferred.  Neuro: No focal deficits. Steady gait.  Psych: Mood and affect normal and appropriate for situation.  MSK: No focal spinal tenderness to palpation, full range of motion in bilateral upper extremities Extremities: No edema. Skin: Warm and dry.  LABORATORY DATA:  None for this visit   DIAGNOSTIC IMAGING:  Most recent mammogram:  Normal, completed last week, not yet scanned    ASSESSMENT AND PLAN:  Ms.. Lister is a pleasant 69 y.o. female with history of Stage 0 left breast DCIS, ER+/PR+, diagnosed in 10/2012, treated with lumpectomy, adjuvant radiation therapy, and anti-estrogen therapy with Tamoxifen beginning in  03/2013 for 5 years.  She presents to the Survivorship Clinic for surveillance and routine follow-up.   1. History of breast cancer:  Ms. Whiston is currently clinically and radiographically without evidence of disease or recurrence of breast cancer. She will be due  for mammogram in 10/2020.  She would like to continue to see [place] on an annual basis, so I requested an appointment for her to f/u with [place] next year.  I encouraged her to call me with any questions or concerns before her next visit at the cancer center, and I would be happy to see her sooner, if needed.    2. Bone health:  Given Ms. Encinas's age/history of breast cancer, she is at risk for bone demineralization. Her last DEXA scan in 02/2019 showed osteopenia.  She was unaware of these results so I gave her a copy of them and also detailed information about bone health.    3. Cancer screening:  Due to Ms. Elwood's history and her age, she should receive screening for skin cancers, colon cancer, and gynecologic cancers. She was encouraged to follow-up with her PCP for appropriate cancer screenings.   4. Health maintenance and wellness promotion: Ms. Tobia was encouraged to consume 5-7 servings of fruits and vegetables per day. She was also encouraged to engage in moderate to vigorous exercise for 30 minutes per day most days of the week. She was instructed to limit her alcohol consumption and continue to abstain from tobacco use.    Dispo:  -Return to cancer center in one year for LTS f/u -Mammogram in 10/2020   Total encounter time: 20 minutes*   Gardenia Phlegm, NP [place] [phone]  *Total Encounter Time as defined by the Centers for Medicare and Medicaid Services includes, in addition to the face-to-face time of a patient visit (documented in the note above) non-face-to-face time: obtaining and reviewing outside history, ordering and reviewing medications, tests or procedures, care  coordination (communications with other health care professionals or caregivers) and documentation in the medical record.    Note: PRIMARY CARE PROVIDER Binnie Rail, [place] [phone]

## 2019-11-06 NOTE — Patient Instructions (Signed)

## 2019-11-17 NOTE — Progress Notes (Signed)
Subjective:    Patient ID: Chelsea Moreno, female    DOB: 11-09-1950, 69 y.o.   MRN: [id number]  HPI The patient is here for an acute visit.  Episode of aching chest/throat/right jaw - woke her up morning of 11/6  Saturday morning about 6:30 in the morning she woke up with a deep achy sensation that was substernal in between her breasts.  She also had a severe ache in her throat that eventually went up to her right jaw.  For a brief period of time she also had mid back pain directly opposite of her chest pain.  She laid there and eventually it eased off.  She estimated it lasted about 10 minutes, but she did not look at the clock.  She denies ever having any symptoms similar to this and has not had any similar symptoms since then.  She does yoga once a week.  She goes for a walk you regularly.  She has gone for a walk since then and did not have any concerning or similar symptoms.  She tends to eat the same foods and denies any heartburn.  The night before this occurred she did not have a late night snack or have anything unusual.  She denies shortness of breath or palpitations.  She does have a significant family history of heart disease.  Both parents had heart disease.  Her sister and brother both had heart disease.  Her sister had a heart attack at 76 and her brother had a heart attack at 28.   Medications and allergies reviewed with patient and updated if appropriate.  Patient Active Problem List   Diagnosis Date Noted  . Dizziness 03/25/2019  . Hyponatremia 03/30/2018  . Prediabetes 03/24/2018  . Morbid obesity ([place]) 03/24/2018  . Throat tightness 03/24/2018  . Genetic testing 02/26/2017  . Family history of ovarian cancer   . OSA (obstructive sleep apnea) 09/08/2014  . 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 Vomiting and diarrhea, episodic 07/15/2014  . Chronic venous insufficiency 06/02/2014  . Cancer of lower-inner quadrant of left female breast ([place])  11/07/2012  . Lichen planus 83/66/2947  . Diabetes insipidus ([place]) 03/28/2011  . Osteopenia 09/24/2008  . Depression 09/23/2007  . Dyslipidemia 09/06/2006  . Essential hypertension 09/06/2006    Current Outpatient Medications on File Prior to Visit  Medication Sig Dispense Refill  . atorvastatin (LIPITOR) 80 MG tablet Take 1 tablet by mouth once daily 90 tablet 1  . desmopressin (DDAVP NASAL) 0.01 % solution PLACE 1 SPRAY INTO EACH NOSTRIL 2 TIMES A DAY 10 mL 4  . latanoprost (XALATAN) 0.005 % ophthalmic solution latanoprost 0.005 % eye drops   1 drop every 24 hours by ophthalmic route.    Marland Kitchen lisinopril (ZESTRIL) 20 MG tablet Take 1 tablet (20 mg total) by mouth daily. 90 tablet 1  . ondansetron (ZOFRAN ODT) 4 MG disintegrating tablet Take 1 tablet (4 mg total) by mouth every 8 (eight) hours as needed for nausea or vomiting. 30 tablet 0  . timolol (BETIMOL) 0.5 % ophthalmic solution Place 1 drop into both eyes daily.    . timolol (TIMOPTIC) 0.5 % ophthalmic solution 1 drop every morning.    Marland Kitchen VYZULTA 0.024 % SOLN INSTILL 1 DROP INTO EACH EYE ONCE DAILY AT BEDTIME     No current facility-administered medications on file prior to visit.    Past Medical History:  Diagnosis Date  . Breast cancer ([place]) 11/05/12   left  .  Bursitis   . COLONIC POLYPS, HX OF 09/24/2008  . DEPRESSION 09/23/2007   takes Lexapro daily but hasn't taken since Sept 2014  . Diabetes insipidus ([place])    takes Desmopressin bid  . Diarrhea   . Dizziness    when nauseated gets dizzy  . Family history of ovarian cancer   . Hemorrhoids   . History of bronchitis    yrs ago  . History of migraine    last time many yrs ago  . Hx of radiation therapy 01/29/13- 03/16/13   left breast 4500 cGy 25 sessions, left breast boost 1000 cGy 5 sessions  . HYPERLIPIDEMIA 09/06/2006   takes Atorvasatin nightly  . HYPERTENSION 09/06/2006   takes Lisinopril daily  . Muscle spasms of head and/or neck   . OSTEOPOROSIS 09/24/2008    takes Actonel every 30days  . Vitamin D deficiency    takes OTC Vit D    Past Surgical History:  Procedure Laterality Date  . BREAST BIOPSY Left 64yrs ago  . BREAST LUMPECTOMY WITH NEEDLE LOCALIZATION Left 12/16/2012   Procedure: PARTIAL MASTECTOMY WITH NEEDLE LOCALIZATION;  Surgeon: Stark Klein, MD;  Location: [facility];  Service: General;  Laterality: Left;  1:00 NL at [facility]   . COLONOSCOPY      Social History   Socioeconomic History  . Marital status: Widowed    Spouse name: Not on file  . Number of children: Not on file  . Years of education: Not on file  . Highest education level: Not on file  Occupational History  . Occupation: Professor at [employer]  . Smoking status: Former Smoker    Packs/day: 0.50    Years: 4.00    Pack years: 2.00    Types: Cigarettes    Quit date: 01/07/1973    Years since quitting: 46.8  . Smokeless tobacco: Never Used  . Tobacco comment: quit 3yrs ago  Substance and Sexual Activity  . Alcohol use: Yes    Alcohol/week: 0.0 standard drinks    Comment: socailly  . Drug use: No  . Sexual activity: Not Currently    Birth control/protection: Post-menopausal    Comment: menarche age 55, first live birth age 63, menopause 2002, no HRT  Other Topics Concern  . Not on file  Social History Narrative  . Not on file   Social Determinants of Health   Financial Resource Strain:   . Difficulty of Paying Living Expenses: Not on file  Food Insecurity:   . Worried About Charity fundraiser in the Last Year: Not on file  . Ran Out of Food in the Last Year: Not on file  Transportation Needs:   . Lack of Transportation (Medical): Not on file  . Lack of Transportation (Non-Medical): Not on file  Physical Activity:   . Days of Exercise per Week: Not on file  . Minutes of Exercise per Session: Not on file  Stress:   . Feeling of Stress : Not on file  Social Connections:   . Frequency of Communication with Friends and Family: Not on file  .  Frequency of Social Gatherings with Friends and Family: Not on file  . Attends Religious Services: Not on file  . Active Member of Clubs or Organizations: Not on file  . Attends Archivist Meetings: Not on file  . Marital Status: Not on file    Family History  Problem Relation Age of Onset  . Ovarian cancer Mother   . Cancer Mother  Ovarian  . Heart disease Mother        Before age 12  . Heart attack Mother   . Varicose Veins Mother   . Heart disease Father        Before age 64  . Hyperlipidemia Father   . Hypertension Father   . Heart attack Father   . Heart disease Sister        Before age 68  . Hypertension Sister   . Heart attack Sister 9       second heart attack at 15  . Heart disease Brother        Before age 51  . Hypertension Brother   . Heart attack Brother 29  . Cancer Maternal Aunt        GYN cancer  . Heart attack Maternal Grandmother   . Alcohol abuse Maternal Grandfather   . Parkinson's disease Sister   . ALS Brother     Review of Systems  Constitutional: Negative for chills, diaphoresis and fever.  HENT: Negative for sore throat, trouble swallowing and voice change.   Respiratory: Negative for cough, shortness of breath and wheezing.   Cardiovascular: Positive for chest pain (only this one episodes) and leg swelling (mild). Negative for palpitations.  Gastrointestinal: Negative for abdominal pain and nausea.       No gerd, no belching  Neurological: Negative for dizziness, light-headedness and headaches.       Objective:   Vitals:   11/18/19 1118  BP: (!) 146/76  Pulse: 67  Temp: 98 F (36.7 C)  SpO2: 99%   BP Readings from Last 3 Encounters:  11/18/19 (!) 146/76  11/06/19 (!) 144/63  09/28/19 130/78   Wt Readings from Last 3 Encounters:  11/18/19 216 lb (98 kg)  11/06/19 214 lb 6.4 oz (97.3 kg)  09/28/19 213 lb 9.6 oz (96.9 kg)   Body mass index is 38.26 kg/m.   Physical Exam    Constitutional: Appears  well-developed and well-nourished. No distress.  Head: Normocephalic and atraumatic.  Neck: Neck supple. No tracheal deviation present. No thyromegaly present.  No cervical lymphadenopathy Cardiovascular: Normal rate, regular rhythm and normal heart sounds.  No murmur heard. No carotid bruit .  No edema Pulmonary/Chest: Effort normal and breath sounds normal. No respiratory distress. No has no wheezes. No rales.  Skin: Skin is warm and dry. Not diaphoretic.  Psychiatric: Normal mood and affect. Behavior is normal.       Assessment & Plan:    See Problem List for Assessment and Plan of chronic medical problems.    This visit occurred during the SARS-CoV-2 public health emergency.  Safety protocols were in place, including screening questions prior to the visit, additional usage of staff PPE, and extensive cleaning of exam room while observing appropriate contact time as indicated for disinfecting solutions.

## 2019-11-17 NOTE — Patient Instructions (Addendum)
  Your chest pain sounds atypical.    A referral was ordered for Cardiology.    Someone from their office will call you to schedule an appointment.

## 2019-11-18 ENCOUNTER — Ambulatory Visit (INDEPENDENT_AMBULATORY_CARE_PROVIDER_SITE_OTHER): Payer: Medicare Other | Admitting: Internal Medicine

## 2019-11-18 ENCOUNTER — Encounter: Payer: Self-pay | Admitting: Internal Medicine

## 2019-11-18 ENCOUNTER — Other Ambulatory Visit: Payer: Self-pay

## 2019-11-18 VITALS — BP 146/76 | HR 67 | Temp 98.0°F | Ht 63.0 in | Wt 216.0 lb

## 2019-11-18 DIAGNOSIS — R0789 Other chest pain: Secondary | ICD-10-CM | POA: Diagnosis not present

## 2019-11-18 NOTE — Assessment & Plan Note (Signed)
Acute Episode of chest pain is somewhat atypical for cardiac cause, but for her also sounds atypical for GERD/esophageal in nature which she does not have No recurrent episodes Most concerning is her strong family history of heart disease and I think this is a good opportunity for her to see a cardiologist to see about further evaluation-referral ordered today

## 2019-11-19 ENCOUNTER — Encounter: Payer: Self-pay | Admitting: Internal Medicine

## 2019-11-19 NOTE — Progress Notes (Unsigned)
Outside notes received. Information abstracted. Notes sent to scan.  

## 2019-11-29 NOTE — Progress Notes (Addendum)
Cardiology Office Note:    Date:  11/30/2019   ID:  Chelsea, Moreno 16-Nov-1950, MRN [id number]  PCP:  Binnie Rail, MD  Cardiologist:  No primary care provider on file.  Electrophysiologist:  None   Referring MD: Binnie Rail, MD   Chief Complaint  Patient presents with  . Chest Pain    History of Present Illness:    Chelsea Moreno is a 69 y.o. female with a hx of breast cancer, diabetes insipidus, hypertension, hyperlipidemia who is referred by Dr. Quay Burow for evaluation of chest pain.  She reports that on 11/14/2019, she woke up with chest pain.  Describes dull aching pain in the center of her chest, also with tightness in her throat and radiation to her back.  States the pain was 10 out of 10.  Episode lasted about 10 minutes.  She has had no further episodes of chest pain.  She does not exercise regularly, but does yoga once per week.  Denies any shortness of breath.  Reports occasional lightheadedness, thought to be due to vertigo.  She reports that she has had multiple episodes of syncope, about 1-2 times per year x15 years.  Episodes are associated with nausea/vomiting/diarrhea.  She has not had an episode of syncope over 2 years.  She denies any palpitations.  Reports occasional lower extremity edema.  She did receive radiation treatment for her breast cancer.  She has a history of diabetes insipidus, unclear cause but thought to be due to head trauma as a child.  She smoked for 3 to 4 years, quit in 1974.  Family history includes sister had MI and CVA in early 20s.  Father died of MI in 38s.  Brother had MI in 48s.   Past Medical History:  Diagnosis Date  . Breast cancer ([place]) 11/05/12   left  . Bursitis   . COLONIC POLYPS, HX OF 09/24/2008  . DEPRESSION 09/23/2007   takes Lexapro daily but hasn't taken since Sept 2014  . Diabetes insipidus ([place])    takes Desmopressin bid  . Diarrhea   . Dizziness    when nauseated gets dizzy  . Family history of ovarian cancer   .  Hemorrhoids   . History of bronchitis    yrs ago  . History of migraine    last time many yrs ago  . Hx of radiation therapy 01/29/13- 03/16/13   left breast 4500 cGy 25 sessions, left breast boost 1000 cGy 5 sessions  . HYPERLIPIDEMIA 09/06/2006   takes Atorvasatin nightly  . HYPERTENSION 09/06/2006   takes Lisinopril daily  . Muscle spasms of head and/or neck   . OSTEOPOROSIS 09/24/2008   takes Actonel every 30days  . Vitamin D deficiency    takes OTC Vit D    Past Surgical History:  Procedure Laterality Date  . BREAST BIOPSY Left 48yrs ago  . BREAST LUMPECTOMY WITH NEEDLE LOCALIZATION Left 12/16/2012   Procedure: PARTIAL MASTECTOMY WITH NEEDLE LOCALIZATION;  Surgeon: Stark Klein, MD;  Location: [facility];  Service: General;  Laterality: Left;  1:00 NL at [place]   . COLONOSCOPY      Current Medications: Current Meds  Medication Sig  . atorvastatin (LIPITOR) 80 MG tablet Take 1 tablet by mouth once daily  . desmopressin (DDAVP NASAL) 0.01 % solution PLACE 1 SPRAY INTO EACH NOSTRIL 2 TIMES A DAY  . lisinopril (ZESTRIL) 20 MG tablet Take 1 tablet (20 mg total) by mouth daily.  . ondansetron (ZOFRAN ODT) 4  MG disintegrating tablet Take 1 tablet (4 mg total) by mouth every 8 (eight) hours as needed for nausea or vomiting.  . timolol (BETIMOL) 0.5 % ophthalmic solution Place 1 drop into both eyes daily.  Marland Kitchen VYZULTA 0.024 % SOLN INSTILL 1 DROP INTO EACH EYE ONCE DAILY AT BEDTIME     Allergies:   Patient has no known allergies.   Social History   Socioeconomic History  . Marital status: Widowed    Spouse name: Not on file  . Number of children: Not on file  . Years of education: Not on file  . Highest education level: Not on file  Occupational History  . Occupation: Professor at [employer]  . Smoking status: Former Smoker    Packs/day: 0.50    Years: 4.00    Pack years: 2.00    Types: Cigarettes    Quit date: 01/07/1973    Years since quitting: 46.9  . Smokeless tobacco:  Never Used  . Tobacco comment: quit 59yrs ago  Substance and Sexual Activity  . Alcohol use: Yes    Alcohol/week: 0.0 standard drinks    Comment: socailly  . Drug use: No  . Sexual activity: Not Currently    Birth control/protection: Post-menopausal    Comment: menarche age 43, first live birth age 77, menopause 2002, no HRT  Other Topics Concern  . Not on file  Social History Narrative  . Not on file   Social Determinants of Health   Financial Resource Strain:   . Difficulty of Paying Living Expenses: Not on file  Food Insecurity:   . Worried About Charity fundraiser in the Last Year: Not on file  . Ran Out of Food in the Last Year: Not on file  Transportation Needs:   . Lack of Transportation (Medical): Not on file  . Lack of Transportation (Non-Medical): Not on file  Physical Activity:   . Days of Exercise per Week: Not on file  . Minutes of Exercise per Session: Not on file  Stress:   . Feeling of Stress : Not on file  Social Connections:   . Frequency of Communication with Friends and Family: Not on file  . Frequency of Social Gatherings with Friends and Family: Not on file  . Attends Religious Services: Not on file  . Active Member of Clubs or Organizations: Not on file  . Attends Archivist Meetings: Not on file  . Marital Status: Not on file     Family History: The patient's family history includes ALS in her brother; Alcohol abuse in her maternal grandfather; Cancer in her maternal aunt and mother; Heart attack in her father, maternal grandmother, and mother; Heart attack (age of onset: 58) in her brother; Heart attack (age of onset: 69) in her sister; Heart disease in her brother, father, mother, and sister; Hyperlipidemia in her father; Hypertension in her brother, father, and sister; Ovarian cancer in her mother; Parkinson's disease in her sister; Varicose Veins in her mother.  ROS:   Please see the history of present illness.     All other systems  reviewed and are negative.  EKGs/Labs/Other Studies Reviewed:    The following studies were reviewed today:   EKG:  EKG is  ordered today.  The ekg ordered today demonstrates normal sinus rhythm, rate 64, nonspecific T wave flattening, poor R wave progression  Recent Labs: 09/28/2019: ALT 38; BUN 14; Creat 0.69; Potassium 4.5; Sodium 139  Recent Lipid Panel  Component Value Date/Time   CHOL 238 (H) 09/28/2019 1156   TRIG 218 (H) 09/28/2019 1156   HDL 44 (L) 09/28/2019 1156   CHOLHDL 5.4 (H) 09/28/2019 1156   VLDL 39.4 03/25/2019 1208   LDLCALC 155 (H) 09/28/2019 1156   LDLDIRECT 171.8 03/21/2011 0936    Physical Exam:    VS:  BP 116/74   Pulse 64   Ht 5\' 2"  (1.575 m)   Wt 214 lb 9.6 oz (97.3 kg)   SpO2 94%   BMI 39.25 kg/m     Wt Readings from Last 3 Encounters:  11/30/19 214 lb 9.6 oz (97.3 kg)  11/18/19 216 lb (98 kg)  11/06/19 214 lb 6.4 oz (97.3 kg)     GEN:  Well nourished, well developed in no acute distress HEENT: Normal NECK: No JVD; No carotid bruits LYMPHATICS: No lymphadenopathy CARDIAC: RRR, no murmurs, rubs, gallops RESPIRATORY:  Clear to auscultation without rales, wheezing or rhonchi  ABDOMEN: Soft, non-tender, non-distended MUSCULOSKELETAL:  No edema; No deformity  SKIN: Warm and dry NEUROLOGIC:  Alert and oriented x 3 PSYCHIATRIC:  Normal affect   ASSESSMENT:    1. Chest pain of uncertain etiology   2. Hyperlipidemia, unspecified hyperlipidemia type   3. Essential hypertension    PLAN:    Chest pain: Atypical in description but does have significant CAD risk factors (hypertension, hyperlipidemia, family history, chest radiation).  Will evaluate for ischemia with Lexiscan Myoview.  Will check echocardiogram to evaluate for structural heart disease.  Hypertension: On lisinopril 20 mg daily.  Appears controlled.  Hyperlipidemia: On atorvastatin 80 mg daily.  LDL 155 on 09/28/2019.  Will check calcium score to guide how aggressive to be in  lowering cholesterol  RTC in 3 months   The risks [chest pain, shortness of breath, cardiac arrhythmias, dizziness, blood pressure fluctuations, myocardial infarction, stroke/transient ischemic attack, nausea, vomiting, allergic reaction, radiation exposure, metallic taste sensation and life-threatening complications (estimated to be 1 in 10,000)], benefits (risk stratification, diagnosing coronary artery disease, treatment guidance) and alternatives of a nuclear stress test were discussed in detail with Ms. Ramaswamy and she agrees to proceed.     Medication Adjustments/Labs and Tests Ordered: Current medicines are reviewed at length with the patient today.  Concerns regarding medicines are outlined above.  Orders Placed This Encounter  Procedures  . CT CARDIAC SCORING  . MYOCARDIAL PERFUSION IMAGING  . EKG 12-Lead  . ECHOCARDIOGRAM COMPLETE   No orders of the defined types were placed in this encounter.   Patient Instructions  Medication Instructions:  Your physician recommends that you continue on your current medications as directed. Please refer to the Current Medication list given to you today.  Testing/Procedures: Your physician has requested that you have a lexiscan myoview (AT [place]). For further information please visit [email or web address]. Please follow instruction sheet, as given.  Your physician has requested that you have an echocardiogram. Echocardiography is a painless test that uses sound waves to create images of your heart. It provides your doctor with information about the size and shape of your heart and how well your heart's chambers and valves are working. This procedure takes approximately one hour. There are no restrictions for this procedure.  CT coronary calcium score. This test is done at [address]. This is $150 out of pocket.   Coronary CalciumScan A coronary calcium scan is an imaging test used to look for deposits of calcium  and other fatty materials (plaques) in the inner  lining of the blood vessels of the heart (coronary arteries). These deposits of calcium and plaques can partly clog and narrow the coronary arteries without producing any symptoms or warning signs. This puts a person at risk for a heart attack. This test can detect these deposits before symptoms develop. Tell a health care provider about:  Any allergies you have.  All medicines you are taking, including vitamins, herbs, eye drops, creams, and over-the-counter medicines.  Any problems you or family members have had with anesthetic medicines.  Any blood disorders you have.  Any surgeries you have had.  Any medical conditions you have.  Whether you are pregnant or may be pregnant. What are the risks? Generally, this is a safe procedure. However, problems may occur, including:  Harm to a pregnant woman and her unborn baby. This test involves the use of radiation. Radiation exposure can be dangerous to a pregnant woman and her unborn baby. If you are pregnant, you generally should not have this procedure done.  Slight increase in the risk of cancer. This is because of the radiation involved in the test. What happens before the procedure? No preparation is needed for this procedure. What happens during the procedure?  You will undress and remove any jewelry around your neck or chest.  You will put on a hospital gown.  Sticky electrodes will be placed on your chest. The electrodes will be connected to an electrocardiogram (ECG) machine to record a tracing of the electrical activity of your heart.  A CT scanner will take pictures of your heart. During this time, you will be asked to lie still and hold your breath for 2-3 seconds while a picture of your heart is being taken. The procedure may vary among health care providers and hospitals. What happens after the procedure?  You can get dressed.  You can return to your normal  activities.  It is up to you to get the results of your test. Ask your health care provider, or the department that is doing the test, when your results will be ready. Summary  A coronary calcium scan is an imaging test used to look for deposits of calcium and other fatty materials (plaques) in the inner lining of the blood vessels of the heart (coronary arteries).  Generally, this is a safe procedure. Tell your health care provider if you are pregnant or may be pregnant.  No preparation is needed for this procedure.  A CT scanner will take pictures of your heart.  You can return to your normal activities after the scan is done. This information is not intended to replace advice given to you by your health care provider. Make sure you discuss any questions you have with your health care provider. Document Released: 06/23/2007 Document Revised: 11/14/2015 Document Reviewed: 11/14/2015 Elsevier Interactive Patient Education  2017 [place]: At [facility], you and your health needs are our priority.  As part of our continuing mission to provide you with exceptional heart care, we have created designated Provider Care Teams.  These Care Teams include your primary Cardiologist (physician) and Advanced Practice Providers (APPs -  Physician Assistants and Nurse Practitioners) who all work together to provide you with the care you need, when you need it.  We recommend signing up for the patient portal called "MyChart".  Sign up information is provided on this After Visit Summary.  MyChart is used to connect with patients for Virtual Visits (Telemedicine).  Patients are able to  view lab/test results, encounter notes, upcoming appointments, etc.  Non-urgent messages can be sent to your provider as well.   To learn more about what you can do with MyChart, go to [email or web address].    Your next appointment:   3 month(s)  The format for your next appointment:   In  Person  Provider:   Oswaldo Milian, MD        Signed, Donato Heinz, MD  11/30/2019 9:40 PM    [place]

## 2019-11-30 ENCOUNTER — Encounter: Payer: Self-pay | Admitting: Cardiology

## 2019-11-30 ENCOUNTER — Other Ambulatory Visit: Payer: Self-pay

## 2019-11-30 ENCOUNTER — Ambulatory Visit (INDEPENDENT_AMBULATORY_CARE_PROVIDER_SITE_OTHER): Payer: Medicare Other | Admitting: Cardiology

## 2019-11-30 VITALS — BP 116/74 | HR 64 | Ht 62.0 in | Wt 214.6 lb

## 2019-11-30 DIAGNOSIS — I10 Essential (primary) hypertension: Secondary | ICD-10-CM | POA: Diagnosis not present

## 2019-11-30 DIAGNOSIS — E785 Hyperlipidemia, unspecified: Secondary | ICD-10-CM

## 2019-11-30 DIAGNOSIS — R079 Chest pain, unspecified: Secondary | ICD-10-CM | POA: Diagnosis not present

## 2019-11-30 NOTE — Patient Instructions (Signed)
Medication Instructions:  Your physician recommends that you continue on your current medications as directed. Please refer to the Current Medication list given to you today.  Testing/Procedures: Your physician has requested that you have a lexiscan myoview (AT [place]). For further information please visit [email or web address]. Please follow instruction sheet, as given.  Your physician has requested that you have an echocardiogram. Echocardiography is a painless test that uses sound waves to create images of your heart. It provides your doctor with information about the size and shape of your heart and how well your heart's chambers and valves are working. This procedure takes approximately one hour. There are no restrictions for this procedure.  CT coronary calcium score. This test is done at [address]. This is $150 out of pocket.   Coronary CalciumScan A coronary calcium scan is an imaging test used to look for deposits of calcium and other fatty materials (plaques) in the inner lining of the blood vessels of the heart (coronary arteries). These deposits of calcium and plaques can partly clog and narrow the coronary arteries without producing any symptoms or warning signs. This puts a person at risk for a heart attack. This test can detect these deposits before symptoms develop. Tell a health care provider about:  Any allergies you have.  All medicines you are taking, including vitamins, herbs, eye drops, creams, and over-the-counter medicines.  Any problems you or family members have had with anesthetic medicines.  Any blood disorders you have.  Any surgeries you have had.  Any medical conditions you have.  Whether you are pregnant or may be pregnant. What are the risks? Generally, this is a safe procedure. However, problems may occur, including:  Harm to a pregnant woman and her unborn baby. This test involves the use of radiation. Radiation exposure can  be dangerous to a pregnant woman and her unborn baby. If you are pregnant, you generally should not have this procedure done.  Slight increase in the risk of cancer. This is because of the radiation involved in the test. What happens before the procedure? No preparation is needed for this procedure. What happens during the procedure?  You will undress and remove any jewelry around your neck or chest.  You will put on a hospital gown.  Sticky electrodes will be placed on your chest. The electrodes will be connected to an electrocardiogram (ECG) machine to record a tracing of the electrical activity of your heart.  A CT scanner will take pictures of your heart. During this time, you will be asked to lie still and hold your breath for 2-3 seconds while a picture of your heart is being taken. The procedure may vary among health care providers and hospitals. What happens after the procedure?  You can get dressed.  You can return to your normal activities.  It is up to you to get the results of your test. Ask your health care provider, or the department that is doing the test, when your results will be ready. Summary  A coronary calcium scan is an imaging test used to look for deposits of calcium and other fatty materials (plaques) in the inner lining of the blood vessels of the heart (coronary arteries).  Generally, this is a safe procedure. Tell your health care provider if you are pregnant or may be pregnant.  No preparation is needed for this procedure.  A CT scanner will take pictures of your heart.  You can return to your normal activities  after the scan is done. This information is not intended to replace advice given to you by your health care provider. Make sure you discuss any questions you have with your health care provider. Document Released: 06/23/2007 Document Revised: 11/14/2015 Document Reviewed: 11/14/2015 Elsevier Interactive Patient Education  2017 [place]: At [facility], you and your health needs are our priority.  As part of our continuing mission to provide you with exceptional heart care, we have created designated Provider Care Teams.  These Care Teams include your primary Cardiologist (physician) and Advanced Practice Providers (APPs -  Physician Assistants and Nurse Practitioners) who all work together to provide you with the care you need, when you need it.  We recommend signing up for the patient portal called "MyChart".  Sign up information is provided on this After Visit Summary.  MyChart is used to connect with patients for Virtual Visits (Telemedicine).  Patients are able to view lab/test results, encounter notes, upcoming appointments, etc.  Non-urgent messages can be sent to your provider as well.   To learn more about what you can do with MyChart, go to [email or web address].    Your next appointment:   3 month(s)  The format for your next appointment:   In Person  Provider:   Oswaldo Milian, MD

## 2019-12-14 ENCOUNTER — Other Ambulatory Visit: Payer: Self-pay | Admitting: Internal Medicine

## 2019-12-18 NOTE — Addendum Note (Signed)
Addended by: Oswaldo Milian on: 12/18/2019 05:48 PM   Modules accepted: Orders

## 2019-12-18 NOTE — Addendum Note (Signed)
Addended by: Patria Mane A on: 12/18/2019 03:28 PM   Modules accepted: Orders

## 2019-12-23 ENCOUNTER — Encounter (HOSPITAL_COMMUNITY): Payer: Self-pay | Admitting: *Deleted

## 2019-12-23 ENCOUNTER — Telehealth (HOSPITAL_COMMUNITY): Payer: Self-pay | Admitting: *Deleted

## 2019-12-23 NOTE — Telephone Encounter (Signed)
Sent letter via MyChart with instructions for upcoming stress test on 12/28/19.

## 2019-12-25 ENCOUNTER — Encounter: Payer: Self-pay | Admitting: Internal Medicine

## 2019-12-25 ENCOUNTER — Other Ambulatory Visit: Payer: Self-pay

## 2019-12-25 ENCOUNTER — Ambulatory Visit (INDEPENDENT_AMBULATORY_CARE_PROVIDER_SITE_OTHER): Payer: Medicare Other | Admitting: Internal Medicine

## 2019-12-25 VITALS — BP 124/84 | HR 69 | Ht 62.0 in | Wt 210.5 lb

## 2019-12-25 DIAGNOSIS — E232 Diabetes insipidus: Secondary | ICD-10-CM | POA: Diagnosis not present

## 2019-12-25 LAB — BASIC METABOLIC PANEL
BUN: 13 mg/dL (ref 6–23)
CO2: 26 mEq/L (ref 19–32)
Calcium: 9.4 mg/dL (ref 8.4–10.5)
Chloride: 103 mEq/L (ref 96–112)
Creatinine, Ser: 0.66 mg/dL (ref 0.40–1.20)
GFR: 89.71 mL/min (ref >60.00)
Glucose, Bld: 118 mg/dL — ABNORMAL HIGH (ref 70–99)
Potassium: 3.8 mEq/L (ref 3.5–5.1)
Sodium: 136 mEq/L (ref 135–145)

## 2019-12-25 NOTE — Progress Notes (Signed)
Name: Chelsea Moreno  MRN/ DOB: [id number], Jul 02, 1950    Age/ Sex: 69 y.o., female    PCP: Binnie Rail, MD   Reason for Endocrinology Evaluation: Diabetes Insipidus (DI)     Date of Initial Endocrinology Evaluation: 04/25/2018    HPI: Ms. Chelsea Moreno is a 69 y.o. female with a past medical history of DI, HTN and Dyslipidemia. The patient presented for initial endocrinology clinic visit on 04/25/2018 for consultative assistance with her DI .     HISTORICAL SUMMARY: The patient was first diagnosed with DI  at age 62. She presented with polyuria and polydipsia to where she was not able to perform her daily activities or school activities, she was living in [place], [place] at the time, was evaluated at [facility] for ~ 2 weeks and was initially on injections, followed by a powder medicine but around 2010 she was started on Desmopressin 1 spray Q12 hrs.   The cause of DI is unclear but she believes this was attributed to her recurrent falls as a child.   She has been on Desmopressin 1 spray Q12 hrs until March, 2020 when she was noted with hyponatremia ,Na 128 mEq/L, and was asked to reduce the dose to once a day but that patient felt miserable with polydipsia, drinking ~ 2 gallons of fluid at a time and polyuria with the urge to urinate every 45 minutes. Her repeat sodium was 144 mEq/L but the patient is now using desmopressin ~ every 18 hrs, the last time she used it was ~ 8:30 pm and the time prior to that was around 12:45 AM.   Prior to all this , when she was on Q12 desmopressin dosing she tends to drink water out of habit.     She has no FH of DI  SUBJECTIVE:    Today (12/25/2019):  Ms. Chelsea Moreno is here for a follow up on DI.   Since her last visit here she had one episode of nausea but no vomiting . Anti-emetics help    She has not been using dDAVP as below  dDAVP 0.01 % 1 spray Q 12 hrs on Saturday , Monday,Wednesday and Friday  dDAVP 0.01 % 1 spray daily Sunday, Tuesday and  Thursday   But rather has been using one dose a day and would take extra should she feel extra thirst , frequency and cold   She is going for a stress test next week for evaluation of chest pain    HISTORY:  Past Medical History:  Past Medical History:  Diagnosis Date  . Breast cancer ([place]) 11/05/12   left  . Bursitis   . COLONIC POLYPS, HX OF 09/24/2008  . DEPRESSION 09/23/2007   takes Lexapro daily but hasn't taken since Sept 2014  . Diabetes insipidus ([place])    takes Desmopressin bid  . Diarrhea   . Dizziness    when nauseated gets dizzy  . Family history of ovarian cancer   . Hemorrhoids   . History of bronchitis    yrs ago  . History of migraine    last time many yrs ago  . Hx of radiation therapy 01/29/13- 03/16/13   left breast 4500 cGy 25 sessions, left breast boost 1000 cGy 5 sessions  . HYPERLIPIDEMIA 09/06/2006   takes Atorvasatin nightly  . HYPERTENSION 09/06/2006   takes Lisinopril daily  . Muscle spasms of head and/or neck   . OSTEOPOROSIS 09/24/2008   takes Actonel every 30days  .  Vitamin D deficiency    takes OTC Vit D   Past Surgical History:  Past Surgical History:  Procedure Laterality Date  . BREAST BIOPSY Left 23yrs ago  . BREAST LUMPECTOMY WITH NEEDLE LOCALIZATION Left 12/16/2012   Procedure: PARTIAL MASTECTOMY WITH NEEDLE LOCALIZATION;  Surgeon: Stark Klein, MD;  Location: [facility];  Service: General;  Laterality: Left;  1:00 NL at [place]   . COLONOSCOPY        Social History:  reports that she quit smoking about 46 years ago. Her smoking use included cigarettes. She has a 2.00 pack-year smoking history. She has never used smokeless tobacco. She reports current alcohol use. She reports that she does not use drugs.  Family History: family history includes ALS in her brother; Alcohol abuse in her maternal grandfather; Cancer in her maternal aunt and mother; Heart attack in her father, maternal grandmother, and mother; Heart attack (age of onset: 26) in her  brother; Heart attack (age of onset: 49) in her sister; Heart disease in her brother, father, mother, and sister; Hyperlipidemia in her father; Hypertension in her brother, father, and sister; Ovarian cancer in her mother; Parkinson's disease in her sister; Varicose Veins in her mother.   HOME MEDICATIONS: Allergies as of 12/25/2019   No Known Allergies     Medication List       Accurate as of December 25, 2019 10:58 AM. If you have any questions, ask your nurse or doctor.        atorvastatin 80 MG tablet Commonly known as: LIPITOR Take 1 tablet by mouth once daily   desmopressin 0.01 % solution Commonly known as: DDAVP NASAL PLACE 1 SPRAY INTO EACH NOSTRIL 2 TIMES A DAY   lisinopril 20 MG tablet Commonly known as: ZESTRIL Take 1 tablet (20 mg total) by mouth daily.   ondansetron 4 MG disintegrating tablet Commonly known as: Zofran ODT Take 1 tablet (4 mg total) by mouth every 8 (eight) hours as needed for nausea or vomiting.   timolol 0.5 % ophthalmic solution Commonly known as: BETIMOL Place 1 drop into both eyes daily.   Vyzulta 0.024 % Soln Generic drug: Latanoprostene Bunod INSTILL 1 DROP INTO EACH EYE ONCE DAILY AT BEDTIME           DATA REVIEWED: Results for SANVI, EHLER (MRN [id number]) as of 01/06/2020 05:46  Ref. Range 12/25/2019 11:10  Sodium Latest Ref Range: 135 - 145 mEq/L 136  Potassium Latest Ref Range: 3.5 - 5.1 mEq/L 3.8  Chloride Latest Ref Range: 96 - 112 mEq/L 103  CO2 Latest Ref Range: 19 - 32 mEq/L 26  Glucose Latest Ref Range: 70 - 99 mg/dL 118 (H)  BUN Latest Ref Range: 6 - 23 mg/dL 13  Creatinine Latest Ref Range: 0.40 - 1.20 mg/dL 0.66  Calcium Latest Ref Range: 8.4 - 10.5 mg/dL 9.4  GFR Latest Ref Range: >60.00 mL/min 89.71     ASSESSMENT/PLAN/RECOMMENDATIONS:   1. Diabetes Insipidus:   - Pt  advised to drink to thirst ONLY , to avoid future hyponatremia  - We discussed the consequences of hyponatremia in causing seizures  and coma etc, we discussed promptly getting a medical evaluation for nausea/ vomiting  - Repeat sodium is normal  - She has not been able to follow the above dDAVP scheduling due to busy schedule, she is currently doing dDAVP once daily and the second dose base on symptoms of polyuria and polydipsia    Medications : dDAVP 0.01 % 1 spray daily  and a second dose if symptomatic with polyuria and polydipsia      F/u in 6 months   Signed electronically by: Mack Guise, MD  [facility] Endocrinology  [address] [place], [place] [address] Phone: [phone] FAX: [phone]   CC: Binnie Rail, MD [place] [address] Phone: [phone] Fax: [phone]   Return to Endocrinology clinic as below: Future Appointments  Date Time Provider [place]  12/28/2019  9:35 AM MC-CV [place] ECHO 5 [id number] [facility]  12/28/2019 10:30 AM LBCT-CT 1 LBCT-CT LB-CT [facility]  12/28/2019 10:45 AM MC-CV [facility] NM2/TREAD [id number] [facility]  03/07/2020 10:20 AM Donato Heinz, MD CVD-NORTHLIN [facility]  03/28/2020 11:00 AM Binnie Rail, MD [facility] None  11/04/2020 11:00 AM Causey, Charlestine Massed, NP [facility] None

## 2019-12-25 NOTE — Patient Instructions (Signed)
-   Stop by the lab today

## 2019-12-28 ENCOUNTER — Ambulatory Visit (INDEPENDENT_AMBULATORY_CARE_PROVIDER_SITE_OTHER)
Admission: RE | Admit: 2019-12-28 | Discharge: 2019-12-28 | Disposition: A | Discharge status: Home / Self Care | Payer: Self-pay | Source: Ambulatory Visit | Attending: Cardiology | Admitting: Cardiology

## 2019-12-28 ENCOUNTER — Ambulatory Visit (HOSPITAL_BASED_OUTPATIENT_CLINIC_OR_DEPARTMENT_OTHER): Payer: Medicare Other

## 2019-12-28 ENCOUNTER — Other Ambulatory Visit: Payer: Self-pay

## 2019-12-28 ENCOUNTER — Ambulatory Visit (HOSPITAL_COMMUNITY): Payer: Medicare Other | Attending: Cardiovascular Disease

## 2019-12-28 DIAGNOSIS — E785 Hyperlipidemia, unspecified: Secondary | ICD-10-CM

## 2019-12-28 DIAGNOSIS — R079 Chest pain, unspecified: Secondary | ICD-10-CM | POA: Insufficient documentation

## 2019-12-28 LAB — ECHOCARDIOGRAM COMPLETE
Area-P 1/2: 3.36 cm2
S' Lateral: 2.7 cm

## 2019-12-28 MED ORDER — PERFLUTREN LIPID MICROSPHERE
1.0000 mL | INTRAVENOUS | Status: AC | PRN
  Administered 2019-12-28: 3 mL via INTRAVENOUS

## 2019-12-28 MED ORDER — REGADENOSON 0.4 MG/5ML IV SOLN
0.4000 mg | Freq: Once | INTRAVENOUS | Status: AC
  Administered 2019-12-28: 0.4 mg via INTRAVENOUS

## 2019-12-28 MED ORDER — TECHNETIUM TC 99M TETROFOSMIN IV KIT
31.5000 | PACK | Freq: Once | INTRAVENOUS | Status: AC | PRN
  Administered 2019-12-28: 31.5 via INTRAVENOUS
  Filled 2019-12-28: qty 32

## 2019-12-28 MED ORDER — TECHNETIUM TC 99M TETROFOSMIN IV KIT
10.5000 | PACK | Freq: Once | INTRAVENOUS | Status: AC | PRN
  Administered 2019-12-28: 10.5 via INTRAVENOUS
  Filled 2019-12-28: qty 11

## 2019-12-29 LAB — MYOCARDIAL PERFUSION IMAGING
LV dias vol: 81 mL (ref 46–106)
LV sys vol: 30 mL
Peak HR: 90 {beats}/min
Rest HR: 64 {beats}/min
SDS: 1
SRS: 0
SSS: 1
TID: 0.98

## 2019-12-31 ENCOUNTER — Other Ambulatory Visit: Payer: Self-pay

## 2019-12-31 MED ORDER — EZETIMIBE 10 MG PO TABS: 10.0000 mg | ORAL_TABLET | Freq: Every day | ORAL | 6 refills | Status: DC

## 2020-02-02 DIAGNOSIS — H401131 Primary open-angle glaucoma, bilateral, mild stage: Secondary | ICD-10-CM | POA: Diagnosis not present

## 2020-02-02 DIAGNOSIS — H52203 Unspecified astigmatism, bilateral: Secondary | ICD-10-CM | POA: Diagnosis not present

## 2020-02-02 DIAGNOSIS — H2513 Age-related nuclear cataract, bilateral: Secondary | ICD-10-CM | POA: Diagnosis not present

## 2020-02-12 ENCOUNTER — Telehealth: Payer: Self-pay | Admitting: Internal Medicine

## 2020-02-12 ENCOUNTER — Other Ambulatory Visit: Payer: Self-pay

## 2020-02-12 MED ORDER — DESMOPRESSIN ACETATE SPRAY 0.01 % NA SOLN: NASAL | 4 refills | Status: DC

## 2020-02-12 NOTE — Telephone Encounter (Signed)
1.Medication Requested: desmopressin (DDAVP NASAL) 0.01 % solution    2. Pharmacy (Name, [place]): [facility]  [address], [place], [place] [address]   3. On Med List: yes   4. Last Visit with PCP: 11.10.21  5. Next visit date with PCP: 3.21.22   Agent: Please be advised that RX refills may take up to 3 business days. We ask that you follow-up with your pharmacy.   \

## 2020-02-12 NOTE — Telephone Encounter (Signed)
Sent in today 

## 2020-03-06 NOTE — Progress Notes (Signed)
Cardiology Office Note:    Date:  03/07/2020   ID:  Chelsea Moreno 02-24-1950, MRN [id number]  PCP:  Binnie Rail, MD  Cardiologist:  No primary care provider on file.  Electrophysiologist:  None   Referring MD: Binnie Rail, MD   Chief Complaint  Patient presents with  . Coronary Artery Disease    History of Present Illness:    Chelsea Moreno is a 70 y.o. female with a hx of breast cancer, diabetes insipidus, hypertension, hyperlipidemia, OSA on CPAP who presents for follow-up.  She was referred by Dr. Quay Burow for evaluation of chest pain, initially seen on 11/30/2019.  She reports that on 11/14/2019, she woke up with chest pain.  Describes dull aching pain in the center of her chest, also with tightness in her throat and radiation to her back.  States the pain was 10 out of 10.  Episode lasted about 10 minutes.  She has had no further episodes of chest pain.  She does not exercise regularly, but does yoga once per week.  Denies any shortness of breath.  Reports occasional lightheadedness, thought to be due to vertigo.  She reports that she has had multiple episodes of syncope, about 1-2 times per year x15 years.  Episodes are associated with nausea/vomiting/diarrhea.  She has not had an episode of syncope over 2 years.  She denies any palpitations.  Reports occasional lower extremity edema.  She did receive radiation treatment for her breast cancer.  She has a history of diabetes insipidus, unclear cause but thought to be due to head trauma as a child.  She smoked for 3 to 4 years, quit in 1974.  Family history includes sister had MI and CVA in early 11s.  Father died of MI in 18s.  Brother had MI in 39s.  Echocardiogram on 12/28/2019 showed normal biventricular function, no significant valvular disease.  Lexiscan Myoview on 12/28/2019 showed EF 63%, apical inferior/inferolateral perfusion defect likely consistent with artifact.  Calcium score on 12/28/2019 was 1202 (98th  percentile).  Since last clinic visit, she reports that she is doing well.  She denies any chest pain or dyspnea.  Reports occasional lightheadedness but denies any syncope.  Reports lightheadedness that does not occur with position.  Typically occurs randomly, lasts for seconds and resolves.  She denies any palpitations.  She continues to have lower extremity edema.  Has not been checking BP at home.   Past Medical History:  Diagnosis Date  . Breast cancer ([place]) 11/05/12   left  . Bursitis   . COLONIC POLYPS, HX OF 09/24/2008  . DEPRESSION 09/23/2007   takes Lexapro daily but hasn't taken since Sept 2014  . Diabetes insipidus ([place])    takes Desmopressin bid  . Diarrhea   . Dizziness    when nauseated gets dizzy  . Family history of ovarian cancer   . Hemorrhoids   . History of bronchitis    yrs ago  . History of migraine    last time many yrs ago  . Hx of radiation therapy 01/29/13- 03/16/13   left breast 4500 cGy 25 sessions, left breast boost 1000 cGy 5 sessions  . HYPERLIPIDEMIA 09/06/2006   takes Atorvasatin nightly  . HYPERTENSION 09/06/2006   takes Lisinopril daily  . Muscle spasms of head and/or neck   . OSTEOPOROSIS 09/24/2008   takes Actonel every 30days  . Vitamin D deficiency    takes OTC Vit D    Past Surgical History:  Procedure Laterality Date  . BREAST BIOPSY Left 47yrs ago  . BREAST LUMPECTOMY WITH NEEDLE LOCALIZATION Left 12/16/2012   Procedure: PARTIAL MASTECTOMY WITH NEEDLE LOCALIZATION;  Surgeon: Stark Klein, MD;  Location: [facility];  Service: General;  Laterality: Left;  1:00 NL at [place]   . COLONOSCOPY      Current Medications: Current Meds  Medication Sig  . amLODipine (NORVASC) 5 MG tablet Take 1 tablet (5 mg total) by mouth daily.  Marland Kitchen atorvastatin (LIPITOR) 80 MG tablet Take 1 tablet by mouth once daily  . desmopressin (DDAVP NASAL) 0.01 % solution PLACE 1 SPRAY INTO EACH NOSTRIL 2 TIMES A DAY  . ezetimibe (ZETIA) 10 MG tablet Take 1 tablet (10 mg  total) by mouth daily.  Marland Kitchen lisinopril (ZESTRIL) 20 MG tablet Take 1 tablet (20 mg total) by mouth daily.  . ondansetron (ZOFRAN ODT) 4 MG disintegrating tablet Take 1 tablet (4 mg total) by mouth every 8 (eight) hours as needed for nausea or vomiting.  . timolol (BETIMOL) 0.5 % ophthalmic solution Place 1 drop into both eyes daily.  Marland Kitchen VYZULTA 0.024 % SOLN INSTILL 1 DROP INTO EACH EYE ONCE DAILY AT BEDTIME     Allergies:   Patient has no known allergies.   Social History   Socioeconomic History  . Marital status: Widowed    Spouse name: Not on file  . Number of children: Not on file  . Years of education: Not on file  . Highest education level: Not on file  Occupational History  . Occupation: Professor at [employer]  . Smoking status: Former Smoker    Packs/day: 0.50    Years: 4.00    Pack years: 2.00    Types: Cigarettes    Quit date: 01/07/1973    Years since quitting: 47.1  . Smokeless tobacco: Never Used  . Tobacco comment: quit 34yrs ago  Substance and Sexual Activity  . Alcohol use: Yes    Alcohol/week: 0.0 standard drinks    Comment: socailly  . Drug use: No  . Sexual activity: Not Currently    Birth control/protection: Post-menopausal    Comment: menarche age 81, first live birth age 48, menopause 2002, no HRT  Other Topics Concern  . Not on file  Social History Narrative  . Not on file   Social Determinants of Health   Financial Resource Strain: Not on file  Food Insecurity: Not on file  Transportation Needs: Not on file  Physical Activity: Not on file  Stress: Not on file  Social Connections: Not on file     Family History: The patient's family history includes ALS in her brother; Alcohol abuse in her maternal grandfather; Cancer in her maternal aunt and mother; Heart attack in her father, maternal grandmother, and mother; Heart attack (age of onset: 21) in her brother; Heart attack (age of onset: 37) in her sister; Heart disease in her brother,  father, mother, and sister; Hyperlipidemia in her father; Hypertension in her brother, father, and sister; Ovarian cancer in her mother; Parkinson's disease in her sister; Varicose Veins in her mother.  ROS:   Please see the history of present illness.     All other systems reviewed and are negative.  EKGs/Labs/Other Studies Reviewed:    The following studies were reviewed today:   EKG:  EKG is not ordered today.  The ekg ordered at prior clinic visit demonstrates normal sinus rhythm, rate 64, nonspecific T wave flattening, poor R wave progression  Recent Labs:  09/28/2019: ALT 38 12/25/2019: BUN 13; Creatinine, Ser 0.66; Potassium 3.8; Sodium 136  Recent Lipid Panel    Component Value Date/Time   CHOL 238 (H) 09/28/2019 1156   TRIG 218 (H) 09/28/2019 1156   HDL 44 (L) 09/28/2019 1156   CHOLHDL 5.4 (H) 09/28/2019 1156   VLDL 39.4 03/25/2019 1208   LDLCALC 155 (H) 09/28/2019 1156   LDLDIRECT 171.8 03/21/2011 0936    Physical Exam:    VS:  BP (!) 179/75   Pulse 67   Ht 5\' 2"  (1.575 m)   Wt 218 lb (98.9 kg)   SpO2 97%   BMI 39.87 kg/m     Wt Readings from Last 3 Encounters:  03/07/20 218 lb (98.9 kg)  12/28/19 214 lb (97.1 kg)  12/25/19 210 lb 8 oz (95.5 kg)     GEN:  Well nourished, well developed in no acute distress HEENT: Normal NECK: No JVD; No carotid bruits LYMPHATICS: No lymphadenopathy CARDIAC: RRR, no murmurs, rubs, gallops RESPIRATORY:  Clear to auscultation without rales, wheezing or rhonchi  ABDOMEN: Soft, non-tender, non-distended MUSCULOSKELETAL:  No edema; No deformity  SKIN: Warm and dry NEUROLOGIC:  Alert and oriented x 3 PSYCHIATRIC:  Normal affect   ASSESSMENT:    1. Coronary artery disease involving native coronary artery of native heart without angina pectoris   2. Essential hypertension   3. Hyperlipidemia, unspecified hyperlipidemia type    PLAN:    CAD: Reported atypical chest pain.  Echocardiogram on 12/28/2019 showed normal  biventricular function, no significant valvular disease.  Lexiscan Myoview on 12/28/2019 showed EF 63%, apical inferior/inferolateral perfusion defect likely consistent with artifact.  Calcium score on 12/28/2019 was 1202 (98th percentile). -Continue atorvastatin 80 mg and Zetia 10 mg daily.  Will check lipid panel  Hypertension: On lisinopril 20 mg daily.  BP elevated, will add amlodipine 5 mg daily.  Asked to check BP twice daily for next 2 weeks and call with results.  Hyperlipidemia:  LDL 155 on 09/28/2019 on atorvastatin 80 mg daily.  Zetia 10 mg daily was added.  Goal LDL less than 70.  We will recheck lipid panel.  If remains above goal LDL less than 70, will refer to lipid clinic for evaluation for PCSK9 inhibitor  OSA: reports compliance with CPAP  RTC in 6 months     Medication Adjustments/Labs and Tests Ordered: Current medicines are reviewed at length with the patient today.  Concerns regarding medicines are outlined above.  Orders Placed This Encounter  Procedures  . Lipid panel   Meds ordered this encounter  Medications  . amLODipine (NORVASC) 5 MG tablet    Sig: Take 1 tablet (5 mg total) by mouth daily.    Dispense:  30 tablet    Refill:  3    Patient Instructions  Medication Instructions:  START AMLODIPINE 5mg  DAILY  *If you need a refill on your cardiac medications before your next appointment, please call your pharmacy*  Lab Work: LIPID PANEL- TODAY  If you have labs (blood work) drawn today and your tests are completely normal, you will receive your results only by: Marland Kitchen MyChart Message (if you have MyChart) OR . A paper copy in the mail If you have any lab test that is abnormal or we need to change your treatment, we will call you to review the results.  Follow-Up: At [facility], you and your health needs are our priority.  As part of our continuing mission to provide you with exceptional heart care, we  have created designated Provider Care Teams.  These  Care Teams include your primary Cardiologist (physician) and Advanced Practice Providers (APPs -  Physician Assistants and Nurse Practitioners) who all work together to provide you with the care you need, when you need it.  Your next appointment:   6 month(s)  The format for your next appointment:   In Person  Provider:   Skeet Latch, MD  Other Instructions Please check your blood pressure at home twice daily, write it down.  Call the office or send message via Mychart with the readings in 2 weeks for Dr. Gardiner Rhyme to review.       Signed, Donato Heinz, MD  03/07/2020 10:11 PM    [place]

## 2020-03-07 ENCOUNTER — Encounter: Payer: Self-pay | Admitting: Cardiology

## 2020-03-07 ENCOUNTER — Ambulatory Visit (INDEPENDENT_AMBULATORY_CARE_PROVIDER_SITE_OTHER): Payer: Medicare Other | Admitting: Cardiology

## 2020-03-07 ENCOUNTER — Other Ambulatory Visit: Payer: Self-pay

## 2020-03-07 VITALS — BP 179/75 | HR 67 | Ht 62.0 in | Wt 218.0 lb

## 2020-03-07 DIAGNOSIS — E785 Hyperlipidemia, unspecified: Secondary | ICD-10-CM

## 2020-03-07 DIAGNOSIS — I10 Essential (primary) hypertension: Secondary | ICD-10-CM

## 2020-03-07 DIAGNOSIS — I2510 Atherosclerotic heart disease of native coronary artery without angina pectoris: Secondary | ICD-10-CM

## 2020-03-07 MED ORDER — AMLODIPINE BESYLATE 5 MG PO TABS
5.0000 mg | ORAL_TABLET | Freq: Every day | ORAL | 3 refills | Status: DC
Start: 2020-03-07 — End: 2020-03-25

## 2020-03-07 NOTE — Patient Instructions (Signed)
Medication Instructions:  START AMLODIPINE 5mg  DAILY  *If you need a refill on your cardiac medications before your next appointment, please call your pharmacy*  Lab Work: LIPID PANEL- TODAY  If you have labs (blood work) drawn today and your tests are completely normal, you will receive your results only by: Marland Kitchen MyChart Message (if you have MyChart) OR . A paper copy in the mail If you have any lab test that is abnormal or we need to change your treatment, we will call you to review the results.  Follow-Up: At [facility], you and your health needs are our priority.  As part of our continuing mission to provide you with exceptional heart care, we have created designated Provider Care Teams.  These Care Teams include your primary Cardiologist (physician) and Advanced Practice Providers (APPs -  Physician Assistants and Nurse Practitioners) who all work together to provide you with the care you need, when you need it.  Your next appointment:   6 month(s)  The format for your next appointment:   In Person  Provider:   Skeet Latch, MD  Other Instructions Please check your blood pressure at home twice daily, write it down.  Call the office or send message via Mychart with the readings in 2 weeks for Dr. Gardiner Rhyme to review.

## 2020-03-08 LAB — LIPID PANEL
Chol/HDL Ratio: 5.2 ratio — ABNORMAL HIGH (ref 0.0–4.4)
Cholesterol, Total: 201 mg/dL — ABNORMAL HIGH (ref 100–199)
HDL: 39 mg/dL — ABNORMAL LOW (ref >39)
LDL Chol Calc (NIH): 114 mg/dL — ABNORMAL HIGH (ref 0–99)
Triglycerides: 275 mg/dL — ABNORMAL HIGH (ref 0–149)
VLDL Cholesterol Cal: 48 mg/dL — ABNORMAL HIGH (ref 5–40)

## 2020-03-09 ENCOUNTER — Other Ambulatory Visit: Payer: Self-pay

## 2020-03-09 ENCOUNTER — Ambulatory Visit (INDEPENDENT_AMBULATORY_CARE_PROVIDER_SITE_OTHER): Payer: Medicare Other

## 2020-03-09 VITALS — BP 124/70 | HR 63 | Temp 97.8°F | Ht 62.0 in | Wt 214.2 lb

## 2020-03-09 DIAGNOSIS — Z0000 Encounter for general adult medical examination without abnormal findings: Secondary | ICD-10-CM | POA: Diagnosis not present

## 2020-03-09 NOTE — Patient Instructions (Signed)
Chelsea Moreno , Thank you for taking time to come for your Medicare Wellness Visit. I appreciate your ongoing commitment to your health goals. Please review the following plan we discussed and let me know if I can assist you in the future.   Screening recommendations/referrals: Colonoscopy: 03/06/2013; due every 10 years Mammogram: 10/23/2019; due every year Bone Density: 02/18/2019; due every 2 years Recommended yearly ophthalmology/optometry visit for glaucoma screening and checkup Recommended yearly dental visit for hygiene and checkup  Vaccinations: Influenza vaccine: 09/28/2019 Pneumococcal vaccine: 07/17/2017, 09/10/2018 Tdap vaccine: 09/24/2008; due every 10 years (overdue); can check with local pharmacy. Shingles vaccine: never done   Covid-19: 02/15/2019, 03/11/2019, 10/19/2019  Advanced directives: Please bring a copy of your health care power of attorney and living will to the office at your convenience.  Conditions/risks identified: Yes; Reviewed health maintenance screenings with patient today and relevant education, vaccines, and/or referrals were provided. Please continue to do your personal lifestyle choices by: daily care of teeth and gums, regular physical activity (goal should be 5 days a week for 30 minutes), eat a healthy diet, avoid tobacco and drug use, limiting any alcohol intake, taking a low-dose aspirin (if not allergic or have been advised by your provider otherwise) and taking vitamins and minerals as recommended by your provider. Continue doing brain stimulating activities (puzzles, reading, adult coloring books, staying active) to keep memory sharp. Continue to eat heart healthy diet (full of fruits, vegetables, whole grains, lean protein, water--limit salt, fat, and sugar intake) and increase physical activity as tolerated.  Next appointment: Please schedule your next Medicare Wellness Visit with your Nurse Health Advisor in 1 year by calling [phone].   Preventive Care  70 Years and Older, Female Preventive care refers to lifestyle choices and visits with your health care provider that can promote health and wellness. What does preventive care include?  A yearly physical exam. This is also called an annual well check.  Dental exams once or twice a year.  Routine eye exams. Ask your health care provider how often you should have your eyes checked.  Personal lifestyle choices, including:  Daily care of your teeth and gums.  Regular physical activity.  Eating a healthy diet.  Avoiding tobacco and drug use.  Limiting alcohol use.  Practicing safe sex.  Taking low-dose aspirin every day.  Taking vitamin and mineral supplements as recommended by your health care provider. What happens during an annual well check? The services and screenings done by your health care provider during your annual well check will depend on your age, overall health, lifestyle risk factors, and family history of disease. Counseling  Your health care provider may ask you questions about your:  Alcohol use.  Tobacco use.  Drug use.  Emotional well-being.  Home and relationship well-being.  Sexual activity.  Eating habits.  History of falls.  Memory and ability to understand (cognition).  Work and work Statistician.  Reproductive health. Screening  You may have the following tests or measurements:  Height, weight, and BMI.  Blood pressure.  Lipid and cholesterol levels. These may be checked every 5 years, or more frequently if you are over 12 years old.  Skin check.  Lung cancer screening. You may have this screening every year starting at age 76 if you have a 30-pack-year history of smoking and currently smoke or have quit within the past 15 years.  Fecal occult blood test (FOBT) of the stool. You may have this test every year starting at age 33.  Flexible sigmoidoscopy or colonoscopy. You may have a sigmoidoscopy every 5 years or a colonoscopy  every 10 years starting at age 26.  Hepatitis C blood test.  Hepatitis B blood test.  Sexually transmitted disease (STD) testing.  Diabetes screening. This is done by checking your blood sugar (glucose) after you have not eaten for a while (fasting). You may have this done every 1-3 years.  Bone density scan. This is done to screen for osteoporosis. You may have this done starting at age 29.  Mammogram. This may be done every 1-2 years. Talk to your health care provider about how often you should have regular mammograms. Talk with your health care provider about your test results, treatment options, and if necessary, the need for more tests. Vaccines  Your health care provider may recommend certain vaccines, such as:  Influenza vaccine. This is recommended every year.  Tetanus, diphtheria, and acellular pertussis (Tdap, Td) vaccine. You may need a Td booster every 10 years.  Zoster vaccine. You may need this after age 18.  Pneumococcal 13-valent conjugate (PCV13) vaccine. One dose is recommended after age 4.  Pneumococcal polysaccharide (PPSV23) vaccine. One dose is recommended after age 41. Talk to your health care provider about which screenings and vaccines you need and how often you need them. This information is not intended to replace advice given to you by your health care provider. Make sure you discuss any questions you have with your health care provider. Document Released: 01/21/2015 Document Revised: 09/14/2015 Document Reviewed: 10/26/2014 Elsevier Interactive Patient Education  2017 [place] Prevention in the Home Falls can cause injuries. They can happen to people of all ages. There are many things you can do to make your home safe and to help prevent falls. What can I do on the outside of my home?  Regularly fix the edges of walkways and driveways and fix any cracks.  Remove anything that might make you trip as you walk through a door, such as a  raised step or threshold.  Trim any bushes or trees on the path to your home.  Use bright outdoor lighting.  Clear any walking paths of anything that might make someone trip, such as rocks or tools.  Regularly check to see if handrails are loose or broken. Make sure that both sides of any steps have handrails.  Any raised decks and porches should have guardrails on the edges.  Have any leaves, snow, or ice cleared regularly.  Use sand or salt on walking paths during winter.  Clean up any spills in your garage right away. This includes oil or grease spills. What can I do in the bathroom?  Use night lights.  Install grab bars by the toilet and in the tub and shower. Do not use towel bars as grab bars.  Use non-skid mats or decals in the tub or shower.  If you need to sit down in the shower, use a plastic, non-slip stool.  Keep the floor dry. Clean up any water that spills on the floor as soon as it happens.  Remove soap buildup in the tub or shower regularly.  Attach bath mats securely with double-sided non-slip rug tape.  Do not have throw rugs and other things on the floor that can make you trip. What can I do in the bedroom?  Use night lights.  Make sure that you have a light by your bed that is easy to reach.  Do not use any sheets or blankets  that are too big for your bed. They should not hang down onto the floor.  Have a firm chair that has side arms. You can use this for support while you get dressed.  Do not have throw rugs and other things on the floor that can make you trip. What can I do in the kitchen?  Clean up any spills right away.  Avoid walking on wet floors.  Keep items that you use a lot in easy-to-reach places.  If you need to reach something above you, use a strong step stool that has a grab bar.  Keep electrical cords out of the way.  Do not use floor polish or wax that makes floors slippery. If you must use wax, use non-skid floor  wax.  Do not have throw rugs and other things on the floor that can make you trip. What can I do with my stairs?  Do not leave any items on the stairs.  Make sure that there are handrails on both sides of the stairs and use them. Fix handrails that are broken or loose. Make sure that handrails are as long as the stairways.  Check any carpeting to make sure that it is firmly attached to the stairs. Fix any carpet that is loose or worn.  Avoid having throw rugs at the top or bottom of the stairs. If you do have throw rugs, attach them to the floor with carpet tape.  Make sure that you have a light switch at the top of the stairs and the bottom of the stairs. If you do not have them, ask someone to add them for you. What else can I do to help prevent falls?  Wear shoes that:  Do not have high heels.  Have rubber bottoms.  Are comfortable and fit you well.  Are closed at the toe. Do not wear sandals.  If you use a stepladder:  Make sure that it is fully opened. Do not climb a closed stepladder.  Make sure that both sides of the stepladder are locked into place.  Ask someone to hold it for you, if possible.  Clearly mark and make sure that you can see:  Any grab bars or handrails.  First and last steps.  Where the edge of each step is.  Use tools that help you move around (mobility aids) if they are needed. These include:  Canes.  Walkers.  Scooters.  Crutches.  Turn on the lights when you go into a dark area. Replace any light bulbs as soon as they burn out.  Set up your furniture so you have a clear path. Avoid moving your furniture around.  If any of your floors are uneven, fix them.  If there are any pets around you, be aware of where they are.  Review your medicines with your doctor. Some medicines can make you feel dizzy. This can increase your chance of falling. Ask your doctor what other things that you can do to help prevent falls. This information is  not intended to replace advice given to you by your health care provider. Make sure you discuss any questions you have with your health care provider. Document Released: 10/21/2008 Document Revised: 06/02/2015 Document Reviewed: 01/29/2014 Elsevier Interactive Patient Education  2017 [employer].

## 2020-03-09 NOTE — Progress Notes (Signed)
Subjective:   Chelsea Moreno is a 70 y.o. female who presents for Medicare Annual (Subsequent) preventive examination.  Review of Systems    No ROS. Medicare Wellness Visit. Additional risk factors are reflected in social history. Cardiac Risk Factors include: advanced age (>51men, >88 women);dyslipidemia;hypertension;family history of premature cardiovascular disease;obesity (BMI >30kg/m2)     Objective:    Today's Vitals   03/09/20 1020 03/09/20 1053  BP: 124/70 124/70  Pulse: 63 63  Temp: 97.8 F (36.6 C)   SpO2: 98% 98%  Weight: 214 lb 3.2 oz (97.2 kg)   Height: 5\' 2"  (1.575 m)   PainSc: 0-No pain    Body mass index is 39.18 kg/m.  Advanced Directives 03/09/2020 08/10/2016 05/30/2015 08/13/2014 05/20/2014 11/25/2013 10/19/2013  Does Patient Have a Medical Advance Directive? Yes No No No No No No  Type of Advance Directive Living will;Healthcare Power of Attorney - - - - - -  Does patient want to make changes to medical advance directive? No - Patient declined - - - - - -  Copy of [place] in Chart? No - copy requested - - - - - -  Would patient like information on creating a medical advance directive? - - - Yes - Educational materials given - No - patient declined information No - patient declined information  Pre-existing out of facility DNR order (yellow form or pink MOST form) - - - - - - -    Current Medications (verified) Outpatient Encounter Medications as of 03/09/2020  Medication Sig  . amLODipine (NORVASC) 5 MG tablet Take 1 tablet (5 mg total) by mouth daily.  Marland Kitchen atorvastatin (LIPITOR) 80 MG tablet Take 1 tablet by mouth once daily  . desmopressin (DDAVP NASAL) 0.01 % solution PLACE 1 SPRAY INTO EACH NOSTRIL 2 TIMES A DAY  . ezetimibe (ZETIA) 10 MG tablet Take 1 tablet (10 mg total) by mouth daily.  Marland Kitchen lisinopril (ZESTRIL) 20 MG tablet Take 1 tablet (20 mg total) by mouth daily.  . ondansetron (ZOFRAN ODT) 4 MG disintegrating tablet Take 1 tablet  (4 mg total) by mouth every 8 (eight) hours as needed for nausea or vomiting.  . timolol (BETIMOL) 0.5 % ophthalmic solution Place 1 drop into both eyes daily.  Marland Kitchen VYZULTA 0.024 % SOLN INSTILL 1 DROP INTO EACH EYE ONCE DAILY AT BEDTIME   No facility-administered encounter medications on file as of 03/09/2020.    Allergies (verified) Patient has no known allergies.   History: Past Medical History:  Diagnosis Date  . Breast cancer ([place]) 11/05/12   left  . Bursitis   . COLONIC POLYPS, HX OF 09/24/2008  . DEPRESSION 09/23/2007   takes Lexapro daily but hasn't taken since Sept 2014  . Diabetes insipidus ([place])    takes Desmopressin bid  . Diarrhea   . Dizziness    when nauseated gets dizzy  . Family history of ovarian cancer   . Hemorrhoids   . History of bronchitis    yrs ago  . History of migraine    last time many yrs ago  . Hx of radiation therapy 01/29/13- 03/16/13   left breast 4500 cGy 25 sessions, left breast boost 1000 cGy 5 sessions  . HYPERLIPIDEMIA 09/06/2006   takes Atorvasatin nightly  . HYPERTENSION 09/06/2006   takes Lisinopril daily  . Muscle spasms of head and/or neck   . OSTEOPOROSIS 09/24/2008   takes Actonel every 30days  . Vitamin D deficiency    takes  OTC Vit D   Past Surgical History:  Procedure Laterality Date  . BREAST BIOPSY Left 20yrs ago  . BREAST LUMPECTOMY WITH NEEDLE LOCALIZATION Left 12/16/2012   Procedure: PARTIAL MASTECTOMY WITH NEEDLE LOCALIZATION;  Surgeon: Stark Klein, MD;  Location: [facility];  Service: General;  Laterality: Left;  1:00 NL at [facility]   . COLONOSCOPY     Family History  Problem Relation Age of Onset  . Ovarian cancer Mother   . Cancer Mother        Ovarian  . Heart disease Mother        Before age 33  . Heart attack Mother   . Varicose Veins Mother   . Heart disease Father        Before age 40  . Hyperlipidemia Father   . Hypertension Father   . Heart attack Father   . Heart disease Sister        Before age 5  .  Hypertension Sister   . Heart attack Sister 59       second heart attack at 10  . Heart disease Brother        Before age 75  . Hypertension Brother   . Heart attack Brother 51  . Cancer Maternal Aunt        GYN cancer  . Heart attack Maternal Grandmother   . Alcohol abuse Maternal Grandfather   . Parkinson's disease Sister   . ALS Brother    Social History   Socioeconomic History  . Marital status: Widowed    Spouse name: Not on file  . Number of children: Not on file  . Years of education: Not on file  . Highest education level: Not on file  Occupational History  . Occupation: Professor at [employer]  . Smoking status: Former Smoker    Packs/day: 0.50    Years: 4.00    Pack years: 2.00    Types: Cigarettes    Quit date: 01/07/1973    Years since quitting: 47.2  . Smokeless tobacco: Never Used  . Tobacco comment: quit 83yrs ago  Substance and Sexual Activity  . Alcohol use: Yes    Alcohol/week: 0.0 standard drinks    Comment: socailly  . Drug use: No  . Sexual activity: Not Currently    Birth control/protection: Post-menopausal    Comment: menarche age 27, first live birth age 89, menopause 2002, no HRT  Other Topics Concern  . Not on file  Social History Narrative  . Not on file   Social Determinants of Health   Financial Resource Strain: Low Risk   . Difficulty of Paying Living Expenses: Not hard at all  Food Insecurity: No Food Insecurity  . Worried About Charity fundraiser in the Last Year: Never true  . Ran Out of Food in the Last Year: Never true  Transportation Needs: No Transportation Needs  . Lack of Transportation (Medical): No  . Lack of Transportation (Non-Medical): No  Physical Activity: Insufficiently Active  . Days of Exercise per Week: 1 day  . Minutes of Exercise per Session: 60 min  Stress: No Stress Concern Present  . Feeling of Stress : Not at all  Social Connections: Moderately Integrated  . Frequency of Communication with  Friends and Family: More than three times a week  . Frequency of Social Gatherings with Friends and Family: Once a week  . Attends Religious Services: More than 4 times per year  . Active Member of Clubs  or Organizations: Yes  . Attends Archivist Meetings: More than 4 times per year  . Marital Status: Widowed    Tobacco Counseling Counseling given: Not Answered Comment: quit 71yrs ago   Clinical Intake:  Pre-visit preparation completed: Yes  Pain : No/denies pain Pain Score: 0-No pain     BMI - recorded: 39.18 Nutritional Status: BMI > 30  Obese Nutritional Risks: None Diabetes: No  How often do you need to have someone help you when you read instructions, pamphlets, or other written materials from your doctor or pharmacy?: 1 - Never What is the last grade level you completed in school?: PH.D  Diabetic? no  Interpreter Needed?: No  Information entered by :: Lisette Abu, LPN   Activities of Daily Living In your present state of health, do you have any difficulty performing the following activities: 03/09/2020 11/18/2019  Hearing? N N  Vision? N N  Difficulty concentrating or making decisions? N N  Walking or climbing stairs? N N  Dressing or bathing? N N  Doing errands, shopping? N N  Preparing Food and eating ? N -  Using the Toilet? N -  In the past six months, have you accidently leaked urine? N -  Do you have problems with loss of bowel control? N -  Managing your Medications? N -  Managing your Finances? N -  Housekeeping or managing your Housekeeping? N -  Some recent data might be hidden    Patient Care Team: Binnie Rail, MD as PCP - General (Internal Medicine)  Indicate any recent Medical Services you may have received from other than [facility] providers in the past year (date may be approximate).     Assessment:   This is a routine wellness examination for Eboney.  Hearing/Vision screen No exam data present  Dietary issues and  exercise activities discussed: Current Exercise Habits: Structured exercise class, Type of exercise: yoga, Time (Minutes): 60, Frequency (Times/Week): 1, Weekly Exercise (Minutes/Week): 60, Intensity: Moderate, Exercise limited by: None identified  Goals    . Client understands the importance of follow-up with providers by attending scheduled visits     I would like to eat better, lose some weight and continue to be active/independent.      Depression Screen PHQ 2/9 Scores 03/09/2020 03/24/2018 07/17/2017 09/08/2014  PHQ - 2 Score 0 0 0 1    Fall Risk Fall Risk  03/09/2020 03/25/2019 03/24/2018 08/09/2017 07/17/2017  Falls in the past year? 0 0 0 No Yes  Comment - - - Emmi Telephone Survey: data to providers prior to load -  Number falls in past yr: 0 0 - - 1  Injury with Fall? 0 0 - - Yes  Comment - - - - sprained ankle  Risk for fall due to : No Fall Risks - - - -  Follow up Falls evaluation completed - - - -    FALL RISK PREVENTION PERTAINING TO THE HOME:  Any stairs in or around the home? No  If so, are there any without handrails? No  Home free of loose throw rugs in walkways, pet beds, electrical cords, etc? Yes  Adequate lighting in your home to reduce risk of falls? Yes   ASSISTIVE DEVICES UTILIZED TO PREVENT FALLS:  Life alert? No  Use of a cane, walker or w/c? No  Grab bars in the bathroom? No  Shower chair or bench in shower? No  Elevated toilet seat or a handicapped toilet? No   TIMED UP  AND GO:  Was the test performed? No .  Length of time to ambulate 10 feet: 0 sec.   Gait steady and fast without use of assistive device  Cognitive Function: Normal cognitive status assessed by direct observation by this Nurse Health Advisor. No abnormalities found.          Immunizations Immunization History  Administered Date(s) Administered  . Fluad Quad(high Dose 65+) 09/10/2018, 09/28/2019  . Influenza Split 09/25/2010, 11/16/2011  . Influenza Whole 09/26/2009  .  Influenza, High Dose Seasonal PF 01/15/2017  . Influenza,inj,Quad PF,6+ Mos 11/09/2015  . Influenza,inj,quad, With Preservative 10/08/2017  . PFIZER(Purple Top)SARS-COV-2 Vaccination 02/15/2019, 03/11/2019, 10/19/2019  . Pneumococcal Conjugate-13 07/17/2017  . Pneumococcal Polysaccharide-23 09/10/2018  . Td 09/24/2008    TDAP status: Due, Education has been provided regarding the importance of this vaccine. Advised may receive this vaccine at local pharmacy or Health Dept. Aware to provide a copy of the vaccination record if obtained from local pharmacy or Health Dept. Verbalized acceptance and understanding.  Flu Vaccine status: Up to date  Pneumococcal vaccine status: Up to date  Covid-19 vaccine status: Completed vaccines  Qualifies for Shingles Vaccine? Yes   Zostavax completed No   Shingrix Completed?: No.    Education has been provided regarding the importance of this vaccine. Patient has been advised to call insurance company to determine out of pocket expense if they have not yet received this vaccine. Advised may also receive vaccine at local pharmacy or Health Dept. Verbalized acceptance and understanding.  Screening Tests Health Maintenance  Topic Date Due  . TETANUS/TDAP  09/25/2018  . COVID-19 Vaccine (4 - Booster for Pfizer series) 04/18/2020  . MAMMOGRAM  10/22/2020  . DEXA SCAN  02/17/2021  . COLONOSCOPY (Pts 45-43yrs Insurance coverage will need to be confirmed)  03/07/2023  . INFLUENZA VACCINE  Completed  . Hepatitis C Screening  Completed  . PNA vac Low Risk Adult  Completed  . HPV VACCINES  Aged Out    Health Maintenance  Health Maintenance Due  Topic Date Due  . TETANUS/TDAP  09/25/2018    Colorectal cancer screening: Type of screening: Colonoscopy. Completed 03/06/2013. Repeat every 10 years  Mammogram status: Completed 10/23/2019. Repeat every year  Bone Density status: Completed 02/18/2019. Results reflect: Bone density results: OSTEOPENIA. Repeat  every 2 years.  Lung Cancer Screening: (Low Dose CT Chest recommended if Age 60-80 years, 30 pack-year currently smoking OR have quit w/in 15years.) does qualify.   Lung Cancer Screening Referral: no  Additional Screening:  Hepatitis C Screening: does qualify; Completed yes  Vision Screening: Recommended annual ophthalmology exams for early detection of glaucoma and other disorders of the eye. Is the patient up to date with their annual eye exam?  Yes  Who is the provider or what is the name of the office in which the patient attends annual eye exams? Luberta Mutter, MD. If pt is not established with a provider, would they like to be referred to a provider to establish care? No .   Dental Screening: Recommended annual dental exams for proper oral hygiene  Community Resource Referral / Chronic Care Management: CRR required this visit?  No   CCM required this visit?  No      Plan:     I have personally reviewed and noted the following in the patient's chart:   . Medical and social history . Use of alcohol, tobacco or illicit drugs  . Current medications and supplements . Functional ability and status .  Nutritional status . Physical activity . Advanced directives . List of other physicians . Hospitalizations, surgeries, and ER visits in previous 12 months . Vitals . Screenings to include cognitive, depression, and falls . Referrals and appointments  In addition, I have reviewed and discussed with patient certain preventive protocols, quality metrics, and best practice recommendations. A written personalized care plan for preventive services as well as general preventive health recommendations were provided to patient.     Sheral Flow, LPN   01/08/6433   Nurse Notes:  Medications reviewed with patient; no opioid use noted.

## 2020-03-22 ENCOUNTER — Other Ambulatory Visit: Payer: Self-pay

## 2020-03-22 ENCOUNTER — Telehealth: Payer: Self-pay | Admitting: Internal Medicine

## 2020-03-22 MED ORDER — ATORVASTATIN CALCIUM 80 MG PO TABS: 80.0000 mg | ORAL_TABLET | Freq: Every day | ORAL | 0 refills | Status: DC

## 2020-03-22 NOTE — Telephone Encounter (Signed)
Faxed in today. 

## 2020-03-22 NOTE — Telephone Encounter (Signed)
Patient requesting refill for atorvastatin (LIPITOR) 80 MG tablet  [place], [place]

## 2020-03-25 MED ORDER — AMLODIPINE BESYLATE 5 MG PO TABS: 10.0000 mg | ORAL_TABLET | Freq: Every day | ORAL | 3 refills | Status: DC

## 2020-03-27 NOTE — Progress Notes (Signed)
Subjective:    Patient ID: Chelsea Moreno, female    DOB: Mar 24, 1950, 70 y.o.   MRN: [id number]  HPI The patient is here for follow up of their chronic medical problems, including htn, prediabetes, dyslipidemia, OSA  Rash on R arm and then moved to left foraerm.  Started about 2 months ago.  Has increased in spots.  Itchy intermittnetly.  Has had lichen planus on leg before.  This does look similar.  She has had a rough start of the year.  Her sister passed away and her brother-in-law passed away as well.  She is currently not working and feels like she is missing her purpose.   Medications and allergies reviewed with patient and updated if appropriate.  Patient Active Problem List   Diagnosis Date Noted  . High coronary artery calcium score, 1202 03/28/2020  . Dizziness 03/25/2019  . Prediabetes 03/24/2018  . Morbid obesity ([place]) 03/24/2018  . Throat tightness 03/24/2018  . Genetic testing 02/26/2017  . Family history of ovarian cancer   . OSA (obstructive sleep apnea) 09/08/2014  . 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 Vomiting and diarrhea, episodic 07/15/2014  . Chronic venous insufficiency 06/02/2014  . Cancer of lower-inner quadrant of left female breast ([place]) 11/07/2012  . Lichen planus 49/67/5916  . Diabetes insipidus ([place]) 03/28/2011  . Osteopenia 09/24/2008  . Depression 09/23/2007  . Dyslipidemia 09/06/2006  . Essential hypertension 09/06/2006    Current Outpatient Medications on File Prior to Visit  Medication Sig Dispense Refill  . amLODipine (NORVASC) 5 MG tablet Take 2 tablets (10 mg total) by mouth daily. 30 tablet 3  . atorvastatin (LIPITOR) 80 MG tablet Take 1 tablet (80 mg total) by mouth daily. 90 tablet 0  . desmopressin (DDAVP NASAL) 0.01 % solution PLACE 1 SPRAY INTO EACH NOSTRIL 2 TIMES A DAY 10 mL 4  . ezetimibe (ZETIA) 10 MG tablet Take 1 tablet (10 mg total) by mouth daily. 30 tablet 6  . lisinopril (ZESTRIL) 20 MG tablet Take 1  tablet (20 mg total) by mouth daily. 90 tablet 1  . ondansetron (ZOFRAN ODT) 4 MG disintegrating tablet Take 1 tablet (4 mg total) by mouth every 8 (eight) hours as needed for nausea or vomiting. 30 tablet 0  . timolol (TIMOPTIC) 0.5 % ophthalmic solution     . VYZULTA 0.024 % SOLN INSTILL 1 DROP INTO EACH EYE ONCE DAILY AT BEDTIME     No current facility-administered medications on file prior to visit.    Past Medical History:  Diagnosis Date  . Breast cancer ([place]) 11/05/12   left  . Bursitis   . COLONIC POLYPS, HX OF 09/24/2008  . DEPRESSION 09/23/2007   takes Lexapro daily but hasn't taken since Sept 2014  . Diabetes insipidus ([place])    takes Desmopressin bid  . Diarrhea   . Dizziness    when nauseated gets dizzy  . Family history of ovarian cancer   . Hemorrhoids   . History of bronchitis    yrs ago  . History of migraine    last time many yrs ago  . Hx of radiation therapy 01/29/13- 03/16/13   left breast 4500 cGy 25 sessions, left breast boost 1000 cGy 5 sessions  . HYPERLIPIDEMIA 09/06/2006   takes Atorvasatin nightly  . HYPERTENSION 09/06/2006   takes Lisinopril daily  . Muscle spasms of head and/or neck   . OSTEOPOROSIS 09/24/2008   takes Actonel every 30days  . Vitamin D  deficiency    takes OTC Vit D    Past Surgical History:  Procedure Laterality Date  . BREAST BIOPSY Left 52yrs ago  . BREAST LUMPECTOMY WITH NEEDLE LOCALIZATION Left 12/16/2012   Procedure: PARTIAL MASTECTOMY WITH NEEDLE LOCALIZATION;  Surgeon: Stark Klein, MD;  Location: [facility];  Service: General;  Laterality: Left;  1:00 NL at [facility]   . COLONOSCOPY      Social History   Socioeconomic History  . Marital status: Widowed    Spouse name: Not on file  . Number of children: Not on file  . Years of education: Not on file  . Highest education level: Not on file  Occupational History  . Occupation: Professor at [employer]  . Smoking status: Former Smoker    Packs/day: 0.50    Years: 4.00     Pack years: 2.00    Types: Cigarettes    Quit date: 01/07/1973    Years since quitting: 47.2  . Smokeless tobacco: Never Used  . Tobacco comment: quit 52yrs ago  Substance and Sexual Activity  . Alcohol use: Yes    Alcohol/week: 0.0 standard drinks    Comment: socailly  . Drug use: No  . Sexual activity: Not Currently    Birth control/protection: Post-menopausal    Comment: menarche age 36, first live birth age 35, menopause 2002, no HRT  Other Topics Concern  . Not on file  Social History Narrative  . Not on file   Social Determinants of Health   Financial Resource Strain: Low Risk   . Difficulty of Paying Living Expenses: Not hard at all  Food Insecurity: No Food Insecurity  . Worried About Charity fundraiser in the Last Year: Never true  . Ran Out of Food in the Last Year: Never true  Transportation Needs: No Transportation Needs  . Lack of Transportation (Medical): No  . Lack of Transportation (Non-Medical): No  Physical Activity: Insufficiently Active  . Days of Exercise per Week: 1 day  . Minutes of Exercise per Session: 60 min  Stress: No Stress Concern Present  . Feeling of Stress : Not at all  Social Connections: Moderately Integrated  . Frequency of Communication with Friends and Family: More than three times a week  . Frequency of Social Gatherings with Friends and Family: Once a week  . Attends Religious Services: More than 4 times per year  . Active Member of Clubs or Organizations: Yes  . Attends Archivist Meetings: More than 4 times per year  . Marital Status: Widowed    Family History  Problem Relation Age of Onset  . Ovarian cancer Mother   . Cancer Mother        Ovarian  . Heart disease Mother        Before age 51  . Heart attack Mother   . Varicose Veins Mother   . Heart disease Father        Before age 74  . Hyperlipidemia Father   . Hypertension Father   . Heart attack Father   . Heart disease Sister        Before age 11   . Hypertension Sister   . Heart attack Sister 42       second heart attack at 54  . Heart disease Brother        Before age 67  . Hypertension Brother   . Heart attack Brother 70  . Cancer Maternal Aunt  GYN cancer  . Heart attack Maternal Grandmother   . Alcohol abuse Maternal Grandfather   . Parkinson's disease Sister   . ALS Brother     Review of Systems  Constitutional: Negative for fever.  Respiratory: Negative for cough, shortness of breath and wheezing.   Cardiovascular: Positive for leg swelling (chronic - end of day - no change - mild). Negative for chest pain and palpitations.  Neurological: Negative for light-headedness and headaches.       Objective:   Vitals:   03/28/20 1113  BP: 130/76  Pulse: 69  Temp: 98.2 F (36.8 C)  SpO2: 96%   BP Readings from Last 3 Encounters:  03/28/20 130/76  03/09/20 124/70  03/07/20 (!) 179/75   Wt Readings from Last 3 Encounters:  03/28/20 216 lb (98 kg)  03/09/20 214 lb 3.2 oz (97.2 kg)  03/07/20 218 lb (98.9 kg)   Body mass index is 39.51 kg/m.   Physical Exam    Constitutional: Appears well-developed and well-nourished. No distress.  HENT:  Head: Normocephalic and atraumatic.  Neck: Neck supple. No tracheal deviation present. No thyromegaly present.  No cervical lymphadenopathy Cardiovascular: Normal rate, regular rhythm and normal heart sounds.   No murmur heard. No carotid bruit .  No edema Pulmonary/Chest: Effort normal and breath sounds normal. No respiratory distress. No has no wheezes. No rales.  Skin: Skin is warm and dry. Not diaphoretic.  Several circular lesions on bilateral posterior forearms that are slightly dry and in a cluster Psychiatric: Normal mood and affect. Behavior is normal.      Assessment & Plan:    See Problem List for Assessment and Plan of chronic medical problems.    This visit occurred during the SARS-CoV-2 public health emergency.  Safety protocols were in place,  including screening questions prior to the visit, additional usage of staff PPE, and extensive cleaning of exam room while observing appropriate contact time as indicated for disinfecting solutions.

## 2020-03-27 NOTE — Patient Instructions (Addendum)
  Blood work was ordered.     Medications changes include :   Clobetasol for the rash  Your prescription(s) have been submitted to your pharmacy. Please take as directed and contact our office if you believe you are having problem(s) with the medication(s).    Please followup in 6 months

## 2020-03-28 ENCOUNTER — Encounter: Payer: Self-pay | Admitting: Internal Medicine

## 2020-03-28 ENCOUNTER — Other Ambulatory Visit: Payer: Self-pay

## 2020-03-28 ENCOUNTER — Ambulatory Visit (INDEPENDENT_AMBULATORY_CARE_PROVIDER_SITE_OTHER): Payer: Medicare Other | Admitting: Internal Medicine

## 2020-03-28 VITALS — BP 130/76 | HR 69 | Temp 98.2°F | Ht 62.0 in | Wt 216.0 lb

## 2020-03-28 DIAGNOSIS — R931 Abnormal findings on diagnostic imaging of heart and coronary circulation: Secondary | ICD-10-CM | POA: Insufficient documentation

## 2020-03-28 DIAGNOSIS — R7303 Prediabetes: Secondary | ICD-10-CM

## 2020-03-28 DIAGNOSIS — G4733 Obstructive sleep apnea (adult) (pediatric): Secondary | ICD-10-CM

## 2020-03-28 DIAGNOSIS — E785 Hyperlipidemia, unspecified: Secondary | ICD-10-CM

## 2020-03-28 DIAGNOSIS — L439 Lichen planus, unspecified: Secondary | ICD-10-CM | POA: Diagnosis not present

## 2020-03-28 DIAGNOSIS — I10 Essential (primary) hypertension: Secondary | ICD-10-CM

## 2020-03-28 LAB — CBC WITH DIFFERENTIAL/PLATELET
Basophils Absolute: 0.1 10*3/uL (ref 0.0–0.1)
Basophils Relative: 1.8 % (ref 0.0–3.0)
Eosinophils Absolute: 0.2 10*3/uL (ref 0.0–0.7)
Eosinophils Relative: 4.5 % (ref 0.0–5.0)
HCT: 36.6 % (ref 36.0–46.0)
Hemoglobin: 12.9 g/dL (ref 12.0–15.0)
Lymphocytes Relative: 25.2 % (ref 12.0–46.0)
Lymphs Abs: 1.4 10*3/uL (ref 0.7–4.0)
MCHC: 35.3 g/dL (ref 30.0–36.0)
MCV: 88.6 fl (ref 78.0–100.0)
Monocytes Absolute: 0.6 10*3/uL (ref 0.1–1.0)
Monocytes Relative: 10.4 % (ref 3.0–12.0)
Neutro Abs: 3.1 10*3/uL (ref 1.4–7.7)
Neutrophils Relative %: 58.1 % (ref 43.0–77.0)
Platelets: 239 10*3/uL (ref 150.0–400.0)
RBC: 4.13 Mil/uL (ref 3.87–5.11)
RDW: 13.4 % (ref 11.5–15.5)
WBC: 5.4 10*3/uL (ref 4.0–10.5)

## 2020-03-28 LAB — LIPID PANEL
Cholesterol: 179 mg/dL (ref 0–200)
HDL: 40.7 mg/dL (ref >39.00)
LDL Cholesterol: 106 mg/dL — ABNORMAL HIGH (ref 0–99)
NonHDL: 138.42
Total CHOL/HDL Ratio: 4
Triglycerides: 162 mg/dL — ABNORMAL HIGH (ref 0.0–149.0)
VLDL: 32.4 mg/dL (ref 0.0–40.0)

## 2020-03-28 LAB — COMPREHENSIVE METABOLIC PANEL
ALT: 28 U/L (ref 0–35)
AST: 24 U/L (ref 0–37)
Albumin: 4.6 g/dL (ref 3.5–5.2)
Alkaline Phosphatase: 80 U/L (ref 39–117)
BUN: 15 mg/dL (ref 6–23)
CO2: 30 mEq/L (ref 19–32)
Calcium: 9.4 mg/dL (ref 8.4–10.5)
Chloride: 99 mEq/L (ref 96–112)
Creatinine, Ser: 0.58 mg/dL (ref 0.40–1.20)
GFR: 92.38 mL/min (ref >60.00)
Glucose, Bld: 123 mg/dL — ABNORMAL HIGH (ref 70–99)
Potassium: 4 mEq/L (ref 3.5–5.1)
Sodium: 137 mEq/L (ref 135–145)
Total Bilirubin: 0.9 mg/dL (ref 0.2–1.2)
Total Protein: 7.3 g/dL (ref 6.0–8.3)

## 2020-03-28 LAB — HEMOGLOBIN A1C: Hgb A1c MFr Bld: 6.4 % (ref 4.6–6.5)

## 2020-03-28 MED ORDER — CLOBETASOL PROPIONATE 0.05 % EX CREA: 1.0000 "application " | TOPICAL_CREAM | Freq: Two times a day (BID) | CUTANEOUS | 0 refills | Status: DC

## 2020-03-28 NOTE — Assessment & Plan Note (Signed)
Chronic BP well controlled Continue amlodipine 10 mg daily, lisinopril 20 mg daily cmp

## 2020-03-28 NOTE — Assessment & Plan Note (Signed)
Chronic History of lichen planus on legs Now has rash on bilateral forearms suggestive of lichen planus Start clobetasol 0.05 percent cream twice daily If no improvement will see dermatology

## 2020-03-28 NOTE — Assessment & Plan Note (Signed)
Has seen cardiology Had nuclear stress test, echocardiogram Placed on Zetia in addition to her statin and blood pressure medication increased Lipid panel, CMP today

## 2020-03-28 NOTE — Assessment & Plan Note (Signed)
Chronic Found to have a very high coronary artery calcium score-1202 Continue Zetia 10 mg daily and atorvastatin 80 mg daily Check CMP, lipid panel She is exercising Heart healthy diet stressed

## 2020-03-28 NOTE — Assessment & Plan Note (Signed)
Chronic Check a1c Low sugar / carb diet Stressed regular exercise  

## 2020-03-29 ENCOUNTER — Telehealth: Payer: Self-pay | Admitting: Cardiology

## 2020-03-29 NOTE — Telephone Encounter (Signed)
*  STAT* If patient is at the pharmacy, call can be transferred to refill team.   1. Which medications need to be refilled? (please list name of each medication and dose if known)  amLODipine (NORVASC) 5 MG tablet  2. Which pharmacy/location (including street and city if local pharmacy) is medication to be sent to?  [place], [place]  3. Do they need a 30 day or 90 day supply? [facility]

## 2020-03-30 ENCOUNTER — Other Ambulatory Visit: Payer: Self-pay | Admitting: *Deleted

## 2020-03-30 MED ORDER — AMLODIPINE BESYLATE 10 MG PO TABS: 10.0000 mg | ORAL_TABLET | Freq: Every day | ORAL | 3 refills | Status: DC

## 2020-03-30 MED ORDER — AMLODIPINE BESYLATE 5 MG PO TABS: 10.0000 mg | ORAL_TABLET | Freq: Every day | ORAL | 3 refills | Status: DC

## 2020-04-05 ENCOUNTER — Other Ambulatory Visit: Payer: Self-pay | Admitting: Internal Medicine

## 2020-04-26 ENCOUNTER — Ambulatory Visit (INDEPENDENT_AMBULATORY_CARE_PROVIDER_SITE_OTHER): Payer: Medicare Other | Admitting: Pharmacist Clinician (PhC)/ Clinical Pharmacy Specialist

## 2020-04-26 ENCOUNTER — Telehealth: Payer: Self-pay

## 2020-04-26 ENCOUNTER — Other Ambulatory Visit: Payer: Self-pay

## 2020-04-26 DIAGNOSIS — I10 Essential (primary) hypertension: Secondary | ICD-10-CM

## 2020-04-26 DIAGNOSIS — E785 Hyperlipidemia, unspecified: Secondary | ICD-10-CM

## 2020-04-26 DIAGNOSIS — R931 Abnormal findings on diagnostic imaging of heart and coronary circulation: Secondary | ICD-10-CM | POA: Diagnosis not present

## 2020-04-26 MED ORDER — NEXLIZET 180-10 MG PO TABS: 180.0000 mg | ORAL_TABLET | Freq: Every day | ORAL | 11 refills | Status: DC

## 2020-04-26 NOTE — Telephone Encounter (Signed)
Called and spoke w/pt and stated that they were approved for nexlizet. Two weeks labs ordered for uric and cmp. Also 3 months ordered lipid and hepatic fasting. Pt voiced understading

## 2020-04-26 NOTE — Progress Notes (Signed)
04/26/2020 Chelsea Moreno August 07, 1950 [id number]   HPI:  Chelsea Moreno is a 70 y.o. female patient of Dr Gardiner Rhyme, who presents today for a lipid clinic evaluation.  See pertinent past medical history below.   She has a strong family history of ASCVD as well as an elevated calcium score.  She states compliance with high intensity statin (atorvastatin 80 mg) as well as ezetimibe 10 mg.   She is in the office to discuss further options for cholesterol lowering.   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 (98th percentile for age/gender)  hypertension 179/75 at last visit 2/22 - added amlodipine 5 mg to lisinopril 20  OSA   Diabetes insipidus On desmopressin bid  Pre-diabetes 3/22 - A1c 6.4    Current Medications: atorvastatin 80 mg qd, ezetimibe 10 mg qd  Cholesterol Goals: LDL < 70   Intolerant/previously tried: none, nkda  Family history: both parents with MI, mom with MI in early 3's, died at 69 ovarian ca, faither died maassive MI at 28, one brother MI and CABG at 63, one sister MI/stroke in mid 36's 2 other MI after thatI, recently passed at 33; older sister w/ Parkinsons', other brother died ALS; 1 son now 24 - elevated trigs, obesity  Diet: mostly home cooked, has significant digestive issues, avoids fried foods; tries to avoid salt d/t BP issues; prefers carbs for digestive tract; some vegetables, more chicken and lean red meat and pork on occasion; does some vegetarian  Exercise:  Yoga once weekly for a hour  Labs: 3/22 TC 179, TG 162, HDL 40.7, LDL 106 (atorvastatin 80, ezetimibe 10)   Current Outpatient Medications  Medication Sig Dispense Refill  . amLODipine (NORVASC) 10 MG tablet Take 1 tablet (10 mg total) by mouth daily. 90 tablet 3  . atorvastatin (LIPITOR) 80 MG tablet Take 1 tablet (80 mg total) by mouth daily. 90 tablet 0  . clobetasol cream (TEMOVATE) 2.42 % Apply 1 application topically 2 (two) times daily. 30 g 0  . desmopressin (DDAVP NASAL) 0.01 %  solution PLACE 1 SPRAY INTO EACH NOSTRIL 2 TIMES A DAY 10 mL 4  . lisinopril (ZESTRIL) 20 MG tablet TAKE ONE TABLET BY MOUTH DAILY 90 tablet 1  . ondansetron (ZOFRAN ODT) 4 MG disintegrating tablet Take 1 tablet (4 mg total) by mouth every 8 (eight) hours as needed for nausea or vomiting. 30 tablet 0  . timolol (TIMOPTIC) 0.5 % ophthalmic solution     . VYZULTA 0.024 % SOLN INSTILL 1 DROP INTO EACH EYE ONCE DAILY AT BEDTIME    . Bempedoic Acid-Ezetimibe (NEXLIZET) 180-10 MG TABS Take 180 mg by mouth daily. 30 tablet 11   No current facility-administered medications for this visit.    No Known Allergies  Past Medical History:  Diagnosis Date  . Breast cancer ([place]) 11/05/12   left  . Bursitis   . COLONIC POLYPS, HX OF 09/24/2008  . DEPRESSION 09/23/2007   takes Lexapro daily but hasn't taken since Sept 2014  . Diabetes insipidus ([place])    takes Desmopressin bid  . Diarrhea   . Dizziness    when nauseated gets dizzy  . Family history of ovarian cancer   . Hemorrhoids   . History of bronchitis    yrs ago  . History of migraine    last time many yrs ago  . Hx of radiation therapy 01/29/13- 03/16/13   left breast 4500 cGy 25 sessions, left breast boost 1000 cGy 5 sessions  .  HYPERLIPIDEMIA 09/06/2006   takes Atorvasatin nightly  . HYPERTENSION 09/06/2006   takes Lisinopril daily  . Muscle spasms of head and/or neck   . OSTEOPOROSIS 09/24/2008   takes Actonel every 30days  . Vitamin D deficiency    takes OTC Vit D    Blood pressure 122/68, pulse 73, resp. rate 16, height 5\' 1"  (1.549 m), weight 215 lb 3.2 oz (97.6 kg), SpO2 98 %.   Dyslipidemia Patient with elevated calcium score and strong family history of ASCVD, currently with LDL not at goal at 106.  This is despite high intensity statin and ezetimibe use.  Reviewed options for lowering LDL cholesterol, including PCSK-9 inhibitors, bempedoic acid and inclisiran.  Discussed mechanisms of action, dosing, side effects and potential  decreases in LDL cholesterol.  Answered all patient questions.  Based on this information, patient would prefer to start bempedoic acid.  She is rather hesitant about injections and would prefer to see if she can get to goal with oral therapy.  She understands that if she can't get LDL to < 70, she would stop the bempedoic acid and her preference would be then to try inclisiran.  Will start PA process today and repeat labs after 3 months.     Tommy Medal PharmD CPP [facility] [address] [place], [place] [address] [phone]

## 2020-04-26 NOTE — Assessment & Plan Note (Signed)
Patient with elevated calcium score and strong family history of ASCVD, currently with LDL not at goal at 106.  This is despite high intensity statin and ezetimibe use.  Reviewed options for lowering LDL cholesterol, including PCSK-9 inhibitors, bempedoic acid and inclisiran.  Discussed mechanisms of action, dosing, side effects and potential decreases in LDL cholesterol.  Answered all patient questions.  Based on this information, patient would prefer to start bempedoic acid.  She is rather hesitant about injections and would prefer to see if she can get to goal with oral therapy.  She understands that if she can't get LDL to < 70, she would stop the bempedoic acid and her preference would be then to try inclisiran.  Will start PA process today and repeat labs after 3 months.

## 2020-04-26 NOTE — Telephone Encounter (Signed)
-----   Message from Rockne Menghini, [place] sent at 04/26/2020  2:17 PM EDT ----- Can you do a PA for nexlizet?  She's tricare for life  Thank you!! K

## 2020-04-26 NOTE — Patient Instructions (Addendum)
Your Results:             Your most recent labs Goal  Total Cholesterol 179 < 200  Triglycerides 162 < 150  HDL (happy/good cholesterol) 40.7 > 40  LDL (lousy/bad cholesterol 106 < 70     Medication changes:  We will start the process to get Nexlizet covered by your insurance.  Once approved [facility] will contact you and you can pick up at your local pharmacy.   Stop ezetimibe when you start the Nexlizet  Lab orders:  Repeat cholesterol and liver function labs in 3 months.      Thank you for choosing [facility]

## 2020-05-12 DIAGNOSIS — E785 Hyperlipidemia, unspecified: Secondary | ICD-10-CM | POA: Diagnosis not present

## 2020-05-13 LAB — URIC ACID: Uric Acid: 6.3 mg/dL (ref 3.0–7.2)

## 2020-06-23 ENCOUNTER — Other Ambulatory Visit: Payer: Self-pay | Admitting: Internal Medicine

## 2020-06-24 ENCOUNTER — Ambulatory Visit: Payer: Medicare Other | Admitting: Internal Medicine

## 2020-07-04 ENCOUNTER — Other Ambulatory Visit: Payer: Self-pay

## 2020-07-12 ENCOUNTER — Telehealth: Payer: Self-pay

## 2020-07-12 MED ORDER — ATORVASTATIN CALCIUM 80 MG PO TABS: 80.0000 mg | ORAL_TABLET | Freq: Every day | ORAL | 0 refills | Status: DC

## 2020-07-12 MED ORDER — DESMOPRESSIN ACETATE SPRAY 0.01 % NA SOLN: 10.0000 ug | Freq: Two times a day (BID) | NASAL | 3 refills | Status: DC

## 2020-07-12 MED ORDER — LISINOPRIL 20 MG PO TABS: 20.0000 mg | ORAL_TABLET | Freq: Every day | ORAL | 0 refills | Status: DC

## 2020-07-12 NOTE — Telephone Encounter (Signed)
Received refill prescriptions from [facility]. LVM asking pt to confirm pharmacy change before prescriptions refilled. Medications requested: Desmopressin Nasal Spr; Atorvastatin 80mg ; Lisionpril 20mg .  Pt called back & confirms that she will use [facility] for annual meds; & [facility] for acute meds. Will send refill as requested by [facility].

## 2020-07-18 ENCOUNTER — Other Ambulatory Visit: Payer: Self-pay

## 2020-07-18 ENCOUNTER — Telehealth: Payer: Self-pay | Admitting: Internal Medicine

## 2020-07-18 MED ORDER — CLOBETASOL PROPIONATE 0.05 % EX CREA: 1.0000 "application " | TOPICAL_CREAM | Freq: Two times a day (BID) | CUTANEOUS | 0 refills | Status: DC

## 2020-07-18 NOTE — Telephone Encounter (Signed)
   Pharmacy requesting 90 day refill for clobetasol cream (TEMOVATE) 0.05 %  [facility] [place], [place]

## 2020-07-18 NOTE — Telephone Encounter (Signed)
Faxed in today for 90 day supply.

## 2020-07-22 DIAGNOSIS — E785 Hyperlipidemia, unspecified: Secondary | ICD-10-CM | POA: Diagnosis not present

## 2020-07-22 LAB — LIPID PANEL
Chol/HDL Ratio: 3.6 ratio (ref 0.0–4.4)
Cholesterol, Total: 135 mg/dL (ref 100–199)
HDL: 37 mg/dL — ABNORMAL LOW (ref >39)
LDL Chol Calc (NIH): 73 mg/dL (ref 0–99)
Triglycerides: 139 mg/dL (ref 0–149)
VLDL Cholesterol Cal: 25 mg/dL (ref 5–40)

## 2020-07-22 LAB — HEPATIC FUNCTION PANEL
ALT: 32 IU/L (ref 0–32)
AST: 38 IU/L (ref 0–40)
Albumin: 5 g/dL — ABNORMAL HIGH (ref 3.8–4.8)
Alkaline Phosphatase: 75 IU/L (ref 44–121)
Bilirubin Total: 0.6 mg/dL (ref 0.0–1.2)
Bilirubin, Direct: 0.19 mg/dL (ref 0.00–0.40)
Total Protein: 7.5 g/dL (ref 6.0–8.5)

## 2020-07-22 LAB — COMPREHENSIVE METABOLIC PANEL
ALT: 34 IU/L — ABNORMAL HIGH (ref 0–32)
AST: 37 IU/L (ref 0–40)
Albumin/Globulin Ratio: 2.1 (ref 1.2–2.2)
Albumin: 5.1 g/dL — ABNORMAL HIGH (ref 3.8–4.8)
Alkaline Phosphatase: 76 IU/L (ref 44–121)
BUN/Creatinine Ratio: 13 (ref 12–28)
BUN: 9 mg/dL (ref 8–27)
Bilirubin Total: 0.7 mg/dL (ref 0.0–1.2)
CO2: 23 mmol/L (ref 20–29)
Calcium: 9.8 mg/dL (ref 8.7–10.3)
Chloride: 99 mmol/L (ref 96–106)
Creatinine, Ser: 0.7 mg/dL (ref 0.57–1.00)
Globulin, Total: 2.4 g/dL (ref 1.5–4.5)
Glucose: 109 mg/dL — ABNORMAL HIGH (ref 65–99)
Potassium: 4.5 mmol/L (ref 3.5–5.2)
Sodium: 139 mmol/L (ref 134–144)
Total Protein: 7.5 g/dL (ref 6.0–8.5)
eGFR: 94 mL/min/{1.73_m2} (ref >59)

## 2020-07-22 MED ORDER — NEXLIZET 180-10 MG PO TABS: 180.0000 mg | ORAL_TABLET | Freq: Every day | ORAL | 3 refills | Status: DC

## 2020-07-25 ENCOUNTER — Other Ambulatory Visit: Payer: Self-pay

## 2020-07-25 ENCOUNTER — Telehealth: Payer: Self-pay

## 2020-07-25 DIAGNOSIS — E785 Hyperlipidemia, unspecified: Secondary | ICD-10-CM

## 2020-07-25 NOTE — Telephone Encounter (Signed)
Lmom pt to go over results

## 2020-07-27 DIAGNOSIS — H401131 Primary open-angle glaucoma, bilateral, mild stage: Secondary | ICD-10-CM | POA: Diagnosis not present

## 2020-08-07 NOTE — Progress Notes (Deleted)
Virtual Visit via Video Note  I connected with Chelsea Moreno on 08/07/20 at  1:20 PM EDT by a video enabled telemedicine application and verified that I am speaking with the correct person using two identifiers.   I discussed the limitations of evaluation and management by telemedicine and the availability of in person appointments. The patient expressed understanding and agreed to proceed.  Present for the visit:  Myself, Dr Billey Gosling, Chelsea Moreno.  The patient is currently at home and I am in the office.    No referring provider.    History of Present Illness: This is an acute visit for sore mouth, swollen gums, loss of salty/sweet taste, sometimes sore throat x several weeks    ROS    Social History   Socioeconomic History   Marital status: Widowed    Spouse name: Not on file   Number of children: Not on file   Years of education: Not on file   Highest education level: Not on file  Occupational History   Occupation: Professor at [employer]   Smoking status: Former    Packs/day: 0.50    Years: 4.00    Pack years: 2.00    Types: Cigarettes    Quit date: 01/07/1973    Years since quitting: 47.6   Smokeless tobacco: Never   Tobacco comments:    quit 16yr ago  Substance and Sexual Activity   Alcohol use: Yes    Alcohol/week: 0.0 standard drinks    Comment: socailly   Drug use: No   Sexual activity: Not Currently    Birth control/protection: Post-menopausal    Comment: menarche age 70 first live birth age 70 menopause 2002, no HRT  Other Topics Concern   Not on file  Social History Narrative   Not on file   Social Determinants of Health   Financial Resource Strain: Low Risk    Difficulty of Paying Living Expenses: Not hard at all  Food Insecurity: No Food Insecurity   Worried About RCharity fundraiserin the Last Year: Never true   [place] the Last Year: Never true  Transportation Needs: No Transportation Needs   Lack of  Transportation (Medical): No   Lack of Transportation (Non-Medical): No  Physical Activity: Insufficiently Active   Days of Exercise per Week: 1 day   Minutes of Exercise per Session: 60 min  Stress: No Stress Concern Present   Feeling of Stress : Not at all  Social Connections: Moderately Integrated   Frequency of Communication with Friends and Family: More than three times a week   Frequency of Social Gatherings with Friends and Family: Once a week   Attends Religious Services: More than 4 times per year   Active Member of [employer] Organizations: Yes   Attends CArchivistMeetings: More than 4 times per year   Marital Status: Widowed     Observations/Objective: Appears well in NAD   Assessment and Plan:  See Problem List for Assessment and Plan of chronic medical problems.   Follow Up Instructions:    I discussed the assessment and treatment plan with the patient. The patient was provided an opportunity to ask questions and all were answered. The patient agreed with the plan and demonstrated an understanding of the instructions.   The patient was advised to call back or seek an in-person evaluation if the symptoms worsen or if the condition fails to improve as anticipated.    SMarzetta Board  Lorretta Harp, MD

## 2020-08-08 ENCOUNTER — Ambulatory Visit (INDEPENDENT_AMBULATORY_CARE_PROVIDER_SITE_OTHER): Payer: Medicare Other | Admitting: Internal Medicine

## 2020-08-08 ENCOUNTER — Other Ambulatory Visit: Payer: Self-pay

## 2020-08-08 ENCOUNTER — Encounter: Payer: Self-pay | Admitting: Internal Medicine

## 2020-08-08 DIAGNOSIS — K061 Gingival enlargement: Secondary | ICD-10-CM | POA: Diagnosis not present

## 2020-08-08 DIAGNOSIS — R931 Abnormal findings on diagnostic imaging of heart and coronary circulation: Secondary | ICD-10-CM | POA: Diagnosis not present

## 2020-08-08 DIAGNOSIS — T50905A Adverse effect of unspecified drugs, medicaments and biological substances, initial encounter: Secondary | ICD-10-CM

## 2020-08-08 DIAGNOSIS — R432 Parageusia: Secondary | ICD-10-CM

## 2020-08-08 DIAGNOSIS — I10 Essential (primary) hypertension: Secondary | ICD-10-CM | POA: Diagnosis not present

## 2020-08-08 MED ORDER — METOPROLOL SUCCINATE ER 50 MG PO TB24: 50.0000 mg | ORAL_TABLET | Freq: Every day | ORAL | 3 refills | Status: DC

## 2020-08-08 NOTE — Patient Instructions (Addendum)
    Medications changes include :   stop amlodipine and start metoprolol xl 50 mg daily  Your prescription(s) have been submitted to your pharmacy. Please take as directed and contact our office if you believe you are having problem(s) with the medication(s).   Update me with your symptoms

## 2020-08-08 NOTE — Assessment & Plan Note (Signed)
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  Related to amlodipine-we will discontinue She will let me know if her symptoms do not improve

## 2020-08-08 NOTE — Assessment & Plan Note (Signed)
Chronic Blood pressure very well controlled Continue  lisinopril 20 mg daily Will stop amlodipine 10 mg daily due to the possibility of it causing gingival hyperplasia Start metoprolol XL 50 mg daily Discussed that she needs to monitor blood pressure carefully so we can adjust her medication to make sure her blood pressure is controlled

## 2020-08-08 NOTE — Progress Notes (Signed)
Subjective:    Patient ID: Chelsea Moreno, female    DOB: [birth year], 70 y.o.   MRN: [id number]  HPI This is an acute visit for sore mouth, swollen gums, loss of salty/sweet taste, sometimes sore throat x several weeks  The dentist in early spring told her she had lichen planus.  Asymptomatic.    She had follow up in April - she had tenderness, puffiness on left side  - not the lichen planus - only on right side.  They did not know what was causing her symptoms   Now with gum puffiness, soreness has progressed and is throughout mouth.  Now under tougne is sore as well.  She has a loss of taste and smell x 1 month-things taste  Any movement with the mouth, yawning, chewing is uncomfortable and painful.  It is very difficult to eat-all food aggravates her symptoms.  No recent antibiotics.  No change in medications since a few months ago-amlodipine was increased.  She was also placed on a new cholesterol medication.  Medications and allergies reviewed with patient and updated if appropriate.  Patient Active Problem List   Diagnosis Date Noted   High coronary artery calcium score, 1202 03/28/2020   Dizziness 03/25/2019   Prediabetes 03/24/2018   Morbid obesity ([place]) 03/24/2018   Throat tightness 03/24/2018   Genetic testing 02/26/2017   Family history of ovarian cancer    OSA (obstructive sleep apnea) 09/08/2014   Varicose veins of leg with swelling 08/13/2014   Vomiting and diarrhea, episodic 07/15/2014   Chronic venous insufficiency 06/02/2014   Cancer of lower-inner quadrant of left female breast ([place]) [id number]   Lichen planus [id number]   Diabetes insipidus ([place]) 03/28/2011   Osteopenia 09/24/2008   Depression 09/23/2007   Dyslipidemia 09/06/2006   Essential hypertension 09/06/2006    Current Outpatient Medications on File Prior to Visit  Medication Sig Dispense Refill   amLODipine (NORVASC) 10 MG tablet Take 1 tablet (10 mg total) by mouth daily. 90 tablet 3    atorvastatin (LIPITOR) 80 MG tablet Take 1 tablet (80 mg total) by mouth daily. 90 tablet 0   Bempedoic Acid-Ezetimibe (NEXLIZET) 180-10 MG TABS Take 180 mg by mouth daily. 30 tablet 3   desmopressin (DDAVP NASAL) 0.01 % solution Place 1 spray (10 mcg total) into the nose 2 (two) times daily. PLACE 1 SPRAY INTO EACH NOSTRIL 2 TIMES A DAY 10 mL 3   dorzolamide-timolol (COSOPT) 22.3-6.8 MG/ML ophthalmic solution 1 drop 2 (two) times daily.     lisinopril (ZESTRIL) 20 MG tablet Take 1 tablet (20 mg total) by mouth daily. 90 tablet 0   ondansetron (ZOFRAN ODT) 4 MG disintegrating tablet Take 1 tablet (4 mg total) by mouth every 8 (eight) hours as needed for nausea or vomiting. 30 tablet 0   VYZULTA 0.024 % SOLN INSTILL 1 DROP INTO EACH EYE ONCE DAILY AT BEDTIME     timolol (TIMOPTIC) 0.5 % ophthalmic solution  (Patient not taking: Reported on 08/08/2020)     No current facility-administered medications on file prior to visit.    Past Medical History:  Diagnosis Date   Breast cancer ([place]) 11/05/12   left   Bursitis    COLONIC POLYPS, HX OF 09/24/2008   DEPRESSION 09/23/2007   takes Lexapro daily but hasn't taken since Sept 2014   Diabetes insipidus [facility])    takes Desmopressin bid   Diarrhea    Dizziness    when nauseated gets dizzy   Family  history of ovarian cancer    Hemorrhoids    History of bronchitis    yrs ago   History of migraine    last time many yrs ago   Hx of radiation therapy 01/29/13- 03/16/13   left breast 4500 cGy 25 sessions, left breast boost 1000 cGy 5 sessions   HYPERLIPIDEMIA 09/06/2006   takes Atorvasatin nightly   HYPERTENSION 09/06/2006   takes Lisinopril daily   Muscle spasms of head and/or neck    OSTEOPOROSIS 09/24/2008   takes Actonel every 30days   Vitamin D deficiency    takes OTC Vit D    Past Surgical History:  Procedure Laterality Date   BREAST BIOPSY Left 53yr ago   BREAST LUMPECTOMY WITH NEEDLE LOCALIZATION Left 12/16/2012   Procedure: PARTIAL  MASTECTOMY WITH NEEDLE LOCALIZATION;  Surgeon: FStark Klein MD;  [place] [facility]  Service: General;  [place]ality: [facility];  [facility]   Socioeconomic History   Marital status: Widowed    Spouse name: Not on file   Number of children: Not on file   Years of education: Not on file   Highest education level: Not on file  Occupational History   Occupation: Professor at [employer]   Smoking status: Former    Packs/day: 0.50    Years: 4.00    Pack years: 2.00    Types: Cigarettes    Quit date: 01/07/1973    Years since quitting: 47.6   Smokeless tobacco: Never   Tobacco comments:    quit [id number]  Substance and Sexual Activity   Alcohol use: Yes    Alcohol/week: 0.0 standard drinks    Comment: socailly   Drug use: No   Sexual activity: Not Currently    Birth control/protection: Post-menopausal    Comment: menarche age [age]first live birth age [age]menopause 2002, no HRT  Other Topics Concern   Not on file  Social History Narrative   Not on file   Social Determinants of Health   Financial Resource Strain: Low Risk    Difficulty of Paying Living Expenses: Not hard at all  Food Insecurity: No Food Insecurity   Worried About RuCharity fundraisern the Last Year: Never true   [place] the Last Year: Never true  Transportation Needs: No Transportation Needs   Lack of Transportation (Medical): No   Lack of Transportation (Non-Medical): No  Physical Activity: Insufficiently Active   Days of Exercise per Week: 1 day   Minutes of Exercise per Session: 60 min  Stress: No Stress Concern Present   Feeling of Stress : Not at all  Social Connections: Moderately Integrated   Frequency of Communication with Friends and Family: More than three times a week   Frequency of Social Gatherings with Friends and Family: Once a week   Attends Religious Services: More than 4 times per year   Active Member of [employer] Organizations: Yes    Attends ClArchivisteetings: More than 4 times per year   Marital Status: Widowed    Family History  Problem Relation Age of Onset   Ovarian cancer Mother    Cancer Mother        Ovarian   Heart disease Mother        Before age 70 Heart attack Mother    Varicose Veins Mother    Heart disease Father  Before age 70   Hyperlipidemia Father    Hypertension Father    Heart attack Father    Heart disease Sister        Before age 42   Hypertension Sister    Heart attack Sister 3       second heart attack at 84   Heart disease Brother        Before age 6   Hypertension Brother    Heart attack Brother 83   Cancer Maternal Aunt        GYN cancer   Heart attack Maternal Grandmother    Alcohol abuse Maternal Grandfather    Parkinson's disease Sister    ALS Brother     Review of Systems  Constitutional:  Negative for fever.  HENT:          Swelling, disturbance in taste  Respiratory:  Negative for shortness of breath.   Cardiovascular:  Negative for chest pain and palpitations.  Musculoskeletal:  Negative for arthralgias, joint swelling (No increased) and myalgias.  Skin:  Negative for rash.  Neurological:  Negative for light-headedness and headaches.      Objective:   Vitals:   08/08/20 1321  BP: 130/74  Pulse: 69  Temp: 98.4 F (36.9 C)  SpO2: 99%   BP Readings from Last 3 Encounters:  08/08/20 130/74  04/26/20 122/68  03/28/20 130/76   Wt Readings from Last 3 Encounters:  08/08/20 207 lb 12.8 oz (94.3 kg)  04/26/20 215 lb 3.2 oz (97.6 kg)  03/28/20 216 lb (98 kg)   Body mass index is 39.26 kg/m.   Physical Exam Constitutional:      General: She is not in acute distress.    Appearance: Normal appearance. She is not ill-appearing.  HENT:     Mouth/Throat:     Mouth: Mucous membranes are moist.     Pharynx: Posterior oropharyngeal erythema (Mild swelling and erythema of gums, but more cheeks and under tongue.  No white coating on  tongue) present. No oropharyngeal exudate.  Musculoskeletal:     Cervical back: Neck supple. No tenderness.  Lymphadenopathy:     Cervical: No cervical adenopathy.  Skin:    General: Skin is warm and dry.  Neurological:     Mental Status: She is alert.           Assessment & Plan:    See Problem List for Assessment and Plan of chronic medical problems.    This visit occurred during the SARS-CoV-2 public health emergency.  Safety protocols were in place, including screening questions prior to the visit, additional usage of staff PPE, and extensive cleaning of exam room while observing appropriate contact time as indicated for disinfecting solutions.

## 2020-08-08 NOTE — Assessment & Plan Note (Signed)
Acute Started around April of this year ?  Related to amlodipine-that is a possible side effect so we will discontinue She will update me with her symptoms off the medication

## 2020-08-22 ENCOUNTER — Encounter: Payer: Self-pay | Admitting: Internal Medicine

## 2020-08-24 ENCOUNTER — Ambulatory Visit (INDEPENDENT_AMBULATORY_CARE_PROVIDER_SITE_OTHER): Payer: Medicare Other | Admitting: Internal Medicine

## 2020-08-24 ENCOUNTER — Other Ambulatory Visit: Payer: Self-pay

## 2020-08-24 ENCOUNTER — Encounter: Payer: Self-pay | Admitting: Internal Medicine

## 2020-08-24 VITALS — BP 124/82 | HR 64 | Ht 61.0 in | Wt 206.4 lb

## 2020-08-24 DIAGNOSIS — E232 Diabetes insipidus: Secondary | ICD-10-CM | POA: Diagnosis not present

## 2020-08-24 NOTE — Patient Instructions (Signed)
Keep up the good work

## 2020-08-24 NOTE — Progress Notes (Signed)
Name: Chelsea Moreno  MRN/ DOB: [id number], 1950/01/12    Age/ Sex: 70 y.o., female    PCP: Binnie Rail, MD   Reason for Endocrinology Evaluation: Diabetes Insipidus (DI)     Date of Initial Endocrinology Evaluation: 04/25/2018    HPI: Chelsea Moreno is a 70 y.o. female with a past medical history of DI, HTN and Dyslipidemia. The patient presented for initial endocrinology clinic visit on 04/25/2018 for consultative assistance with her DI .     HISTORICAL SUMMARY: The patient was first diagnosed with DI  at age 57. She presented with polyuria and polydipsia to where she was not able to perform her daily activities or school activities, she was living in [place], [place] at the time, was evaluated at [facility] for ~ 2 weeks and was initially on injections, followed by a powder medicine but around 2010 she was started on Desmopressin 1 spray Q12 hrs.   The cause of DI is unclear but she believes this was attributed to her recurrent falls as a child.   She has been on Desmopressin 1 spray Q12 hrs until March, 2020 when she was noted with hyponatremia ,Na 128 mEq/L, and was asked to reduce the dose to once a day but that patient felt miserable with polydipsia, drinking ~ 2 gallons of fluid at a time and polyuria with the urge to urinate every 45 minutes. Her repeat sodium was 144 mEq/L but the patient is now using desmopressin ~ every 18 hrs, the last time she used it was ~ 8:30 pm and the time prior to that was around 12:45 AM.   Prior to all this , when she was on Q12 desmopressin dosing she tends to drink water out of habit.     She has no FH of DI  SUBJECTIVE:    Today (12/25/2019):  Ms. [name] is here for a follow up on DI.   She has not nausea in a while , denies vomiting or diarrhea  Has glaucoma , working with an opthalmologic with decreased vision   Denies headaches  Has been drinking 60 oz a a day   dDAVP 0.01 % 1 spray daily and a second dose if symptomatic with  polyuria and polydipsia  - she tends to use it BID 2-3  days a week     HISTORY:  Past Medical History:  Past Medical History:  Diagnosis Date   Breast cancer ([place]) 11/05/12   left   Bursitis    COLONIC POLYPS, HX OF 09/24/2008   DEPRESSION 09/23/2007   takes Lexapro daily but hasn't taken since Sept 2014   Diabetes insipidus ([place])    takes Desmopressin bid   Diarrhea    Dizziness    when nauseated gets dizzy   Family history of ovarian cancer    Hemorrhoids    History of bronchitis    yrs ago   History of migraine    last time many yrs ago   Hx of radiation therapy 01/29/13- 03/16/13   left breast 4500 cGy 25 sessions, left breast boost 1000 cGy 5 sessions   HYPERLIPIDEMIA 09/06/2006   takes Atorvasatin nightly   HYPERTENSION 09/06/2006   takes Lisinopril daily   Muscle spasms of head and/or neck    OSTEOPOROSIS 09/24/2008   takes Actonel every 30days   Vitamin D deficiency    takes OTC Vit D   Past Surgical History:  Past Surgical History:  Procedure Laterality Date   BREAST  BIOPSY Left 97yrs ago   BREAST LUMPECTOMY WITH NEEDLE LOCALIZATION Left 12/16/2012   Procedure: PARTIAL MASTECTOMY WITH NEEDLE LOCALIZATION;  Surgeon: Stark Klein, MD;  Location: [facility];  Service: General;  Laterality: Left;  1:00 NL at [place] History:  reports that she quit smoking about 47 years ago. Her smoking use included cigarettes. She has a 2.00 pack-year smoking history. She has never used smokeless tobacco. She reports current alcohol use. She reports that she does not use drugs. Family History: family history includes ALS in her brother; Alcohol abuse in her maternal grandfather; Cancer in her maternal aunt and mother; Heart attack in her father, maternal grandmother, and mother; Heart attack (age of onset: 62) in her brother; Heart attack (age of onset: 76) in her sister; Heart disease in her brother, father, mother, and sister; Hyperlipidemia in her father;  Hypertension in her brother, father, and sister; Ovarian cancer in her mother; Parkinson's disease in her sister; Varicose Veins in her mother.   HOME MEDICATIONS: Allergies as of 08/24/2020   No Known Allergies      Medication List        Accurate as of August 24, 2020 12:53 PM. If you have any questions, ask your nurse or doctor.          atorvastatin 80 MG tablet Commonly known as: LIPITOR Take 1 tablet (80 mg total) by mouth daily.   Cosopt 22.3-6.8 MG/ML ophthalmic solution Generic drug: dorzolamide-timolol 1 drop 2 (two) times daily.   desmopressin 0.01 % solution Commonly known as: DDAVP NASAL Place 1 spray (10 mcg total) into the nose 2 (two) times daily. PLACE 1 SPRAY INTO EACH NOSTRIL 2 TIMES A DAY   lisinopril 20 MG tablet Commonly known as: ZESTRIL Take 1 tablet (20 mg total) by mouth daily.   metoprolol succinate 50 MG 24 hr tablet Commonly known as: TOPROL-XL Take 1 tablet (50 mg total) by mouth daily. Take with or immediately following a meal.   Nexlizet 180-10 MG Tabs Generic drug: Bempedoic Acid-Ezetimibe Take 180 mg by mouth daily.   ondansetron 4 MG disintegrating tablet Commonly known as: Zofran ODT Take 1 tablet (4 mg total) by mouth every 8 (eight) hours as needed for nausea or vomiting.   Vyzulta 0.024 % Soln Generic drug: Latanoprostene Bunod INSTILL 1 DROP INTO EACH EYE ONCE DAILY AT BEDTIME            DATA REVIEWED:  Results for SICILY, ZARAGOZA (MRN [id number]) as of 08/24/2020 17:03  Ref. Range 07/22/2020 11:50  Sodium Latest Ref Range: 134 - 144 mmol/L 139  Potassium Latest Ref Range: 3.5 - 5.2 mmol/L 4.5  Chloride Latest Ref Range: 96 - 106 mmol/L 99  CO2 Latest Ref Range: 20 - 29 mmol/L 23  Glucose Latest Ref Range: 65 - 99 mg/dL 109 (H)  BUN Latest Ref Range: 8 - 27 mg/dL 9  Creatinine Latest Ref Range: 0.57 - 1.00 mg/dL 0.70  Calcium Latest Ref Range: 8.7 - 10.3 mg/dL 9.8  BUN/Creatinine Ratio Latest Ref Range: 12 - 28   13  eGFR Latest Ref Range: >59 mL/min/1.73 94  Alkaline Phosphatase Latest Ref Range: 44 - 121 IU/L 76  Albumin Latest Ref Range: 3.8 - 4.8 g/dL 5.1 (H)  Albumin/Globulin Ratio Latest Ref Range: 1.2 - 2.2  2.1  AST Latest Ref Range: 0 - 40 IU/L 37  ALT Latest Ref Range: 0 - 32 IU/L 34 (H)  Total  Protein Latest Ref Range: 6.0 - 8.5 g/dL 7.5  Total Bilirubin Latest Ref Range: 0.0 - 1.2 mg/dL 0.7     ASSESSMENT/PLAN/RECOMMENDATIONS:   Diabetes Insipidus:   - Pt  advised to drink to thirst ONLY , to avoid future hyponatremia  - We had discussed the consequences of hyponatremia in the past and causing seizures and coma etc, we discussed promptly getting a medical evaluation for nausea/ vomiting  -Sodium has been normal for the past year -No changes today  Medications : dDAVP 0.01 % 1 spray daily and a second dose if symptomatic with polyuria and polydipsia      F/u in 1 yr  Signed electronically by: Mack Guise, MD  [facility]  [facility] [address] [place], [place] [address] Phone: [phone] FAX: [phone]   CC: Binnie Rail, MD [place] [address] Phone: [phone] Fax: [phone]   Return to Endocrinology clinic as below: Future Appointments  Date Time Provider [place]  08/24/2020  3:40 PM Rasheida Broden, Melanie Crazier, MD [facility] None  09/28/2020 11:00 AM Binnie Rail, MD [facility] None  11/04/2020 11:00 AM Causey, Charlestine Massed, NP [facility] None

## 2020-09-27 NOTE — Progress Notes (Signed)
Subjective:    Patient ID: Chelsea Moreno, female    DOB: 22-Dec-1950, 70 y.o.   MRN: [id number]  This visit occurred during the SARS-CoV-2 public health emergency.  Safety protocols were in place, including screening questions prior to the visit, additional usage of staff PPE, and extensive cleaning of exam room while observing appropriate contact time as indicated for disinfecting solutions.     HPI The patient is here for follow up of their chronic medical problems, including htn, CAD, prediabetes, hichol, OSA  She finally saw the periodontist and was diagnosed with lichen planus and prescribed a steroid cream 3-4 times a day.    Weight loss related to not enjoying food due to oral lichen planus.    She is doing some yoga.  Knows she needs to walk  Medications and allergies reviewed with patient and updated if appropriate.  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   Altered taste 08/08/2020   High coronary artery calcium score, 1202 03/28/2020   Dizziness 03/25/2019   Prediabetes 03/24/2018   Morbid obesity ([place]) 03/24/2018   Throat tightness 03/24/2018   Genetic testing 02/26/2017   Family history of ovarian cancer    OSA (obstructive sleep apnea) 09/08/2014   Varicose veins of leg with swelling 08/13/2014   Vomiting and diarrhea, episodic 07/15/2014   Chronic venous insufficiency 06/02/2014   Cancer of lower-inner quadrant of left female breast ([place]) 43/32/9518   Lichen planus 84/16/6063   Diabetes insipidus ([place]) 03/28/2011   Osteopenia 09/24/2008   Depression 09/23/2007   Dyslipidemia 09/06/2006   Essential hypertension 09/06/2006    Current Outpatient Medications on File Prior to Visit  Medication Sig Dispense Refill   atorvastatin (LIPITOR) 80 MG tablet Take 1 tablet (80 mg total) by mouth daily. 90 tablet 0   Bempedoic Acid-Ezetimibe (NEXLIZET) 180-10 MG TABS Take 180 mg by mouth daily. 30 tablet 3    desmopressin (DDAVP NASAL) 0.01 % solution Place 1 spray (10 mcg total) into the nose 2 (two) times daily. PLACE 1 SPRAY INTO EACH NOSTRIL 2 TIMES A DAY 10 mL 3   dorzolamide-timolol (COSOPT) 22.3-6.8 MG/ML ophthalmic solution 1 drop 2 (two) times daily.     lisinopril (ZESTRIL) 20 MG tablet Take 1 tablet (20 mg total) by mouth daily. 90 tablet 0   metoprolol succinate (TOPROL-XL) 50 MG 24 hr tablet Take 1 tablet (50 mg total) by mouth daily. Take with or immediately following a meal. 30 tablet 3   ondansetron (ZOFRAN ODT) 4 MG disintegrating tablet Take 1 tablet (4 mg total) by mouth every 8 (eight) hours as needed for nausea or vomiting. 30 tablet 0   VYZULTA 0.024 % SOLN INSTILL 1 DROP INTO EACH EYE ONCE DAILY AT BEDTIME     No current facility-administered medications on file prior to visit.    Past Medical History:  Diagnosis Date   Breast cancer ([place]) 11/05/12   left   Bursitis    COLONIC POLYPS, HX OF 09/24/2008   DEPRESSION 09/23/2007   takes Lexapro daily but hasn't taken since Sept 2014   Diabetes insipidus ([place])    takes Desmopressin bid   Diarrhea    Dizziness    when nauseated gets dizzy   Family history of ovarian cancer    Hemorrhoids    History of bronchitis    yrs ago   History of migraine    last time many yrs ago  Hx of radiation therapy 01/29/13- 03/16/13   left breast 4500 cGy 25 sessions, left breast boost 1000 cGy 5 sessions   HYPERLIPIDEMIA 09/06/2006   takes Atorvasatin nightly   HYPERTENSION 09/06/2006   takes Lisinopril daily   Muscle spasms of head and/or neck    OSTEOPOROSIS 09/24/2008   takes Actonel every 30days   Vitamin D deficiency    takes OTC Vit D    Past Surgical History:  Procedure Laterality Date   BREAST BIOPSY Left 81yrs ago   BREAST LUMPECTOMY WITH NEEDLE LOCALIZATION Left 12/16/2012   Procedure: PARTIAL MASTECTOMY WITH NEEDLE LOCALIZATION;  Surgeon: Stark Klein, MD;  Location: [place];  Service: General;  Laterality: Left;  1:00 NL  at [place] History   Socioeconomic History   Marital status: Widowed    Spouse name: Not on file   Number of children: Not on file   Years of education: Not on file   Highest education level: Not on file  Occupational History   Occupation: Professor at [employer]   Smoking status: Former    Packs/day: 0.50    Years: 4.00    Pack years: 2.00    Types: Cigarettes    Quit date: 01/07/1973    Years since quitting: 47.7   Smokeless tobacco: Never   Tobacco comments:    quit 3yrs ago  Substance and Sexual Activity   Alcohol use: Yes    Alcohol/week: 0.0 standard drinks    Comment: socailly   Drug use: No   Sexual activity: Not Currently    Birth control/protection: Post-menopausal    Comment: menarche age 67, first live birth age 27, menopause 2002, no HRT  Other Topics Concern   Not on file  Social History Narrative   Not on file   Social Determinants of Health   Financial Resource Strain: Low Risk    Difficulty of Paying Living Expenses: Not hard at all  Food Insecurity: No Food Insecurity   Worried About Charity fundraiser in the Last Year: Never true   [place] in the Last Year: Never true  Transportation Needs: No Transportation Needs   Lack of Transportation (Medical): No   Lack of Transportation (Non-Medical): No  Physical Activity: Insufficiently Active   Days of Exercise per Week: 1 day   Minutes of Exercise per Session: 60 min  Stress: No Stress Concern Present   Feeling of Stress : Not at all  Social Connections: Moderately Integrated   Frequency of Communication with Friends and Family: More than three times a week   Frequency of Social Gatherings with Friends and Family: Once a week   Attends Religious Services: More than 4 times per year   Active Member of [employer] or Organizations: Yes   Attends Archivist Meetings: More than 4 times per year   Marital Status: Widowed    Family History  Problem  Relation Age of Onset   Ovarian cancer Mother    Cancer Mother        Ovarian   Heart disease Mother        Before age 28   Heart attack Mother    Varicose Veins Mother    Heart disease Father        Before age 74   Hyperlipidemia Father    Hypertension Father    Heart attack Father    Heart disease Sister  Before age 93   Hypertension Sister    Heart attack Sister 26       second heart attack at 51   Heart disease Brother        Before age 60   Hypertension Brother    Heart attack Brother 50   Cancer Maternal Aunt        GYN cancer   Heart attack Maternal Grandmother    Alcohol abuse Maternal Grandfather    Parkinson's disease Sister    ALS Brother     Review of Systems  Constitutional:  Negative for chills and fever.  HENT:  Positive for sore throat (occ). Negative for trouble swallowing.   Respiratory:  Negative for cough, shortness of breath and wheezing.   Cardiovascular:  Negative for chest pain, palpitations and leg swelling.  Gastrointestinal:  Negative for nausea.       No gerd  Neurological:  Negative for light-headedness and headaches.      Objective:   Vitals:   09/28/20 1106  BP: 130/78  Pulse: 60  Temp: 98.5 F (36.9 C)  SpO2: 99%   BP Readings from Last 3 Encounters:  09/28/20 130/78  08/24/20 124/82  08/08/20 130/74   Wt Readings from Last 3 Encounters:  09/28/20 201 lb (91.2 kg)  08/24/20 206 lb 6.4 oz (93.6 kg)  08/08/20 207 lb 12.8 oz (94.3 kg)   Body mass index is 37.98 kg/m.   Physical Exam    Constitutional: Appears well-developed and well-nourished. No distress.  HENT:  Head: Normocephalic and atraumatic.  Neck: Neck supple. No tracheal deviation present. No thyromegaly present.  No cervical lymphadenopathy Cardiovascular: Normal rate, regular rhythm and normal heart sounds.   No murmur heard. No carotid bruit .  No edema Pulmonary/Chest: Effort normal and breath sounds normal. No respiratory distress. No has no  wheezes. No rales.  Skin: Skin is warm and dry. Not diaphoretic.  Psychiatric: Normal mood and affect. Behavior is normal.   Lab Results  Component Value Date   HGBA1C 5.9 (A) 09/28/2020   HGBA1C 5.9 09/28/2020   HGBA1C 5.9 09/28/2020   HGBA1C 5.9 09/28/2020      Assessment & Plan:    See Problem List for Assessment and Plan of chronic medical problems.

## 2020-09-27 NOTE — Patient Instructions (Addendum)
  Your a1c was done today.    Flu immunization administered today.     Medications changes include :   none    Please followup in 6 months

## 2020-09-28 ENCOUNTER — Ambulatory Visit (INDEPENDENT_AMBULATORY_CARE_PROVIDER_SITE_OTHER): Payer: Medicare Other | Admitting: Internal Medicine

## 2020-09-28 ENCOUNTER — Encounter: Payer: Self-pay | Admitting: Internal Medicine

## 2020-09-28 ENCOUNTER — Other Ambulatory Visit: Payer: Self-pay

## 2020-09-28 VITALS — BP 130/78 | HR 60 | Temp 98.5°F | Ht 61.0 in | Wt 201.0 lb

## 2020-09-28 DIAGNOSIS — I10 Essential (primary) hypertension: Secondary | ICD-10-CM | POA: Diagnosis not present

## 2020-09-28 DIAGNOSIS — R931 Abnormal findings on diagnostic imaging of heart and coronary circulation: Secondary | ICD-10-CM | POA: Diagnosis not present

## 2020-09-28 DIAGNOSIS — R7303 Prediabetes: Secondary | ICD-10-CM | POA: Diagnosis not present

## 2020-09-28 DIAGNOSIS — Z23 Encounter for immunization: Secondary | ICD-10-CM | POA: Diagnosis not present

## 2020-09-28 DIAGNOSIS — E785 Hyperlipidemia, unspecified: Secondary | ICD-10-CM | POA: Diagnosis not present

## 2020-09-28 LAB — POCT GLYCOSYLATED HEMOGLOBIN (HGB A1C)
HbA1c POC (<> result, manual entry): 5.9 % (ref 4.0–5.6)
HbA1c, POC (controlled diabetic range): 5.9 % (ref 0.0–7.0)
HbA1c, POC (prediabetic range): 5.9 % (ref 5.7–6.4)
Hemoglobin A1C: 5.9 % — AB (ref 4.0–5.6)

## 2020-09-28 NOTE — Assessment & Plan Note (Signed)
Chronic BP well controlled Continue metoprolol xl 50 mg qd, lisinopril 20 mg qd cmp

## 2020-09-28 NOTE — Assessment & Plan Note (Addendum)
Chronic Continue atorvastatin 80 mg qd Regular exercise and healthy diet encouraged

## 2020-09-28 NOTE — Assessment & Plan Note (Signed)
No symptoms suggestive of angina Continue atorvastatin 80 mg daily BP well controlled - continue BP meds Increase exercise Work on weight loss heatlhy diet

## 2020-09-28 NOTE — Assessment & Plan Note (Addendum)
Chronic Check a1c Lab Results  Component Value Date   HGBA1C 5.9 (A) 09/28/2020   HGBA1C 5.9 09/28/2020   HGBA1C 5.9 09/28/2020   HGBA1C 5.9 09/28/2020   improved Low sugar / carb diet Stressed regular exercise  -- start walking in addition to yoga Work more on weight loss

## 2020-10-04 ENCOUNTER — Other Ambulatory Visit: Payer: Self-pay | Admitting: Cardiology

## 2020-10-04 NOTE — Telephone Encounter (Signed)
This is Dr. Schumann's pt 

## 2020-10-06 ENCOUNTER — Other Ambulatory Visit: Payer: Self-pay

## 2020-10-06 ENCOUNTER — Telehealth: Payer: Self-pay

## 2020-10-06 MED ORDER — METOPROLOL SUCCINATE ER 50 MG PO TB24: 50.0000 mg | ORAL_TABLET | Freq: Every day | ORAL | 1 refills | Status: DC

## 2020-10-06 NOTE — Telephone Encounter (Signed)
Faxed in today for patient 

## 2020-10-16 NOTE — Progress Notes (Signed)
Cardiology Office Note   Date:  10/17/2020   ID:  Chelsea Moreno, Chelsea Moreno [birth year], MRN [id number]  PCP:  Binnie Rail, MD  Cardiologist:  Donato Heinz, MD EP: None  Chief Complaint  Patient presents with   Follow-up    CAD       History of Present Illness: Chelsea Moreno is a 70 y.o. female with a PMH of CAD, HTN, HLD, diabetes insipidus (DI), breast cancer, and OSA on CPAP, who presents for routine follow-up.   She was last evaluated by cardiology at an outpatient visit with Dr. Gardiner Rhyme 02/2020 at which time she had complaints of positional lightheadedness but no syncope, as well as LE edema. She was started on amlodipine 5mg  daily and recommended to increase to 10mg  daily given persistently elevated BP's. A lipid panel was obtained showing LDL 106 despite atorvastatin 80mg  daily and zetia 10mg  daily prompting a referral to the lipid clinic and initiation of nexlizet. Repeat lipids 07/2020 showed LDL 73; improved but not quite to baseline <70 and recommended to repeat labs in 6 months per pharmacy. She underwent an extensive cardiac work-up 12/2019 to evaluate complaints of chest pain including an echocardiogram which showed EF 55-60%, G1DD, no RWMA, elevated LVEDP, and no significant valvular abnormalities; a NST that showed a mild defect c/w artifact; low risk study; and a calcium score 12/2019 with significantly elevated calcium score of 1202 placing her in the 98th percentile for age/sex. She also has a history of syncope 1-2 times a year for the past 15 years, noted that episodes are associated with N/V/D. 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   She has been doing well from a cardiac standpoint since her last visit. Continues to have some DOE which was noticeable during a recent trip to [place], [place] when she was walking up hills. No complaints of CP, SOB, or DOE recently. No complaints of palpitations, dizziness,  lightheadedness, syncope, orthopnea, PND, or LE edema. She has been compliant with her CPAP. She was taken off amlodipine due to gingival hyperplasia and LE edema and transitioned to metoprolol succinate without issues.     Past Medical History:  Diagnosis Date   Breast cancer ([place]) 11/05/12   left   Bursitis    COLONIC POLYPS, HX OF 09/24/2008   DEPRESSION 09/23/2007   takes Lexapro daily but hasn't taken since Sept 2014   Diabetes insipidus [facility])    takes Desmopressin bid   Diarrhea    Dizziness    when nauseated gets dizzy   Family history of ovarian cancer    Hemorrhoids    History of bronchitis    yrs ago   History of migraine    last time many yrs ago   Hx of radiation therapy 01/29/13- 03/16/13   left breast 4500 cGy 25 sessions, left breast boost 1000 cGy 5 sessions   HYPERLIPIDEMIA 09/06/2006   takes Atorvasatin nightly   HYPERTENSION 09/06/2006   takes Lisinopril daily   Muscle spasms of head and/or neck    OSTEOPOROSIS 09/24/2008   takes Actonel every 30days   Vitamin D deficiency    takes OTC Vit D    Past Surgical History:  Procedure Laterality Date   BREAST BIOPSY Left 73yrs ago   BREAST LUMPECTOMY WITH NEEDLE LOCALIZATION Left 12/16/2012   Procedure: PARTIAL MASTECTOMY WITH NEEDLE LOCALIZATION;  Surgeon: Stark Klein, MD;  Location: [facility];  Service: General;  Laterality: Left;  1:00 NL at [facility]    COLONOSCOPY       Current Outpatient Medications  Medication Sig Dispense Refill   atorvastatin (LIPITOR) 80 MG tablet Take 1 tablet (80 mg total) by mouth daily. 90 tablet 0   clobetasol ointment (TEMOVATE) 0.05 % Apply topically.     desmopressin (DDAVP NASAL) 0.01 % solution Place 1 spray (10 mcg total) into the nose 2 (two) times daily. PLACE 1 SPRAY INTO EACH NOSTRIL 2 TIMES A DAY 10 mL 3   dorzolamide-timolol (COSOPT) 22.3-6.8 MG/ML ophthalmic solution 1 drop 2 (two) times daily.     lisinopril (ZESTRIL) 20 MG tablet Take 1 tablet (20 mg total) by mouth daily.  90 tablet 0   metoprolol succinate (TOPROL-XL) 50 MG 24 hr tablet Take 1 tablet (50 mg total) by mouth daily. Take with or immediately following a meal. 90 tablet 1   NEXLIZET 180-10 MG TABS TAKE 1 TABLET DAILY 90 tablet 1   ondansetron (ZOFRAN ODT) 4 MG disintegrating tablet Take 1 tablet (4 mg total) by mouth every 8 (eight) hours as needed for nausea or vomiting. 30 tablet 0   VYZULTA 0.024 % SOLN INSTILL 1 DROP INTO EACH EYE ONCE DAILY AT BEDTIME     No current facility-administered medications for this visit.    Allergies:   Patient has no known allergies.    Social History:  The patient  reports that she quit smoking about 47 years ago. Her smoking use included cigarettes. She has a 2.00 pack-year smoking history. She has never used smokeless tobacco. She reports current alcohol use. She reports that she does not use drugs.   Family History:  The patient's family history includes ALS in her brother; Alcohol abuse in her maternal grandfather; Cancer in her maternal aunt and mother; Heart attack in her father, maternal grandmother, and mother; Heart attack (age of onset: 9) in her brother; Heart attack (age of onset: 97) in her sister; Heart disease in her brother, father, mother, and sister; Hyperlipidemia in her father; Hypertension in her brother, father, and sister; Ovarian cancer in her mother; Parkinson's disease in her sister; Varicose Veins in her mother.    ROS:  Please see the history of present illness.   Otherwise, review of systems are positive for none.   All other systems are reviewed and negative.    PHYSICAL EXAM: VS:  BP 134/70 (BP Location: Right Arm, Patient Position: Sitting, Cuff Size: Large)   Pulse (!) 53   Ht 5\' 2"  (1.575 m)   Wt 202 lb 6.4 oz (91.8 kg)   SpO2 98%   BMI 37.02 kg/m  , BMI Body mass index is 37.02 kg/m. GEN: Well nourished, well developed, in no acute distress HEENT: sclera anicteric Neck: no JVD, carotid bruits, or masses Cardiac:  bradycardic, regular rhythm; no murmurs, rubs, or gallops, no edema  Respiratory:  clear to auscultation bilaterally, normal work of breathing GI: soft, nontender, nondistended, + BS MS: no deformity or atrophy Skin: warm and dry, no rash Neuro:  Strength and sensation are intact Psych: euthymic mood, full affect   EKG:  EKG is ordered today. The ekg ordered today demonstrates sinus bradycardia, rate 53 bpm, no STE/D, no TWI; no significant change from previous   Recent Labs: 03/28/2020: Hemoglobin 12.9; Platelets 239.0 07/22/2020: ALT 32; BUN 9; Creatinine, Ser 0.70; Potassium 4.5; Sodium 139    Lipid Panel    Component Value Date/Time   CHOL 135 07/22/2020 1151   TRIG 139 07/22/2020  1151   HDL 37 (L) 07/22/2020 1151   CHOLHDL 3.6 07/22/2020 1151   CHOLHDL 4 03/28/2020 1211   VLDL 32.4 03/28/2020 1211   LDLCALC 73 07/22/2020 1151   LDLCALC 155 (H) 09/28/2019 1156   LDLDIRECT 171.8 03/21/2011 0936      Wt Readings from Last 3 Encounters:  10/17/20 202 lb 6.4 oz (91.8 kg)  09/28/20 201 lb (91.2 kg)  08/24/20 206 lb 6.4 oz (93.6 kg)      Other studies Reviewed: Additional studies/ records that were reviewed today include:   Echocardiogram 12/2019: 1. Left ventricular ejection fraction, by estimation, is 55 to 60%. The  left ventricle has normal function. The left ventricle has no regional  wall motion abnormalities. There is mild concentric left ventricular  hypertrophy. Left ventricular diastolic  parameters are consistent with Grade I diastolic dysfunction (impaired  relaxation). Elevated left ventricular end-diastolic pressure.   2. Right ventricular systolic function is normal. The right ventricular  size is normal.   3. The mitral valve is normal in structure. Trivial mitral valve  regurgitation. No evidence of mitral stenosis.   4. The aortic valve is tricuspid. Aortic valve regurgitation is not  visualized. No aortic stenosis is present.   5. The inferior  vena cava is normal in size with greater than 50%  respiratory variability, suggesting right atrial pressure of 3 mmHg.  NST 12/2019: The left ventricular ejection fraction is normal (55-65%). Nuclear stress EF: 63%. There was no ST segment deviation noted during stress. No T wave inversion was noted during stress. There is a mild severity defect in the apical inferior and inferolateral LV segments that appear worse at rest consistent with artifact. This is a low risk study.   Calcium score 12/2019: IMPRESSION: Coronary calcium score of 1202. This was 98th percentile for age and sex matched control.   ASSESSMENT AND PLAN:  1. Coronary artery disease: elevated calcium score with negative NST and reassuring echocardiogram 12/2019. Recommended for ongoing aggressive risk factor management - Continue statin and zetia - Continue aggressive risk factor modifications to promote LDL <70, A1C <7, and BP <130/80  2. HTN: BP 134/70 today. BP remains above goal, consistently >130/80 at home following recent transition to metoprolol succinate. No room to adjust Bblocker due to bradycardia - Will increase lisinopril to 40mg  daily - plan to repeat BMET in 2-4 weeks - Continue metoprolol succinate  3. HLD: LDL 73 07/2020 after transitioning from zetia to nexlizet and atorvastatin. Planning for repeat lipids 01/2021 per lipid clinic. - Continue atorvastatin 80mg  daily and nexlizet - Will go ahead and update FLP/LFTs when she comes in for blood work in 2-4 weeks.   4. OSA: on CPAP - Continue CPAP   Current medicines are reviewed at length with the patient today.  The patient does not have concerns regarding medicines.  The following changes have been made:  As above  Labs/ tests ordered today include:  No orders of the defined types were placed in this encounter.    Disposition:   FU with Dr. Gardiner Rhyme in 6 months  Signed, Abigail Butts, PA-C  10/17/2020 10:33 AM

## 2020-10-17 ENCOUNTER — Encounter: Payer: Self-pay | Admitting: Medical

## 2020-10-17 ENCOUNTER — Other Ambulatory Visit: Payer: Self-pay

## 2020-10-17 ENCOUNTER — Ambulatory Visit (INDEPENDENT_AMBULATORY_CARE_PROVIDER_SITE_OTHER): Payer: Medicare Other | Admitting: Medical

## 2020-10-17 VITALS — BP 134/70 | HR 53 | Ht 62.0 in | Wt 202.4 lb

## 2020-10-17 DIAGNOSIS — G4733 Obstructive sleep apnea (adult) (pediatric): Secondary | ICD-10-CM | POA: Diagnosis not present

## 2020-10-17 DIAGNOSIS — I2510 Atherosclerotic heart disease of native coronary artery without angina pectoris: Secondary | ICD-10-CM

## 2020-10-17 DIAGNOSIS — I10 Essential (primary) hypertension: Secondary | ICD-10-CM

## 2020-10-17 DIAGNOSIS — E785 Hyperlipidemia, unspecified: Secondary | ICD-10-CM

## 2020-10-17 MED ORDER — LISINOPRIL 40 MG PO TABS: 40.0000 mg | ORAL_TABLET | Freq: Every day | ORAL | 3 refills | Status: DC

## 2020-10-17 NOTE — Patient Instructions (Signed)
Medication Instructions:  INCREASE Lisinopril 40 mg daily  *If you need a refill on your cardiac medications before your next appointment, please call your pharmacy*  Lab Work: Your physician recommends that you return for lab work in 2-4 WEEKS:  CMET Fasting Lipid Panel-DO NOT EAT OR DRINK PAST MIDNIGHT. OKAY TO HAVE WATER.  If you have labs (blood work) drawn today and your tests are completely normal, you will receive your results only by: [facility] (if you have MyChart) OR A paper copy in the mail If you have any lab test that is abnormal or we need to change your treatment, we will call you to review the results.  Testing/Procedures: NONE ordered at this time of appointment   Follow-Up: At [facility], you and your health needs are our priority.  As part of our continuing mission to provide you with exceptional heart care, we have created designated Provider Care Teams.  These Care Teams include your primary Cardiologist (physician) and Advanced Practice Providers (APPs -  Physician Assistants and Nurse Practitioners) who all work together to provide you with the care you need, when you need it.   Your next appointment:   6 month(s)  The format for your next appointment:   In Person  Provider:   Oswaldo Milian, MD  Other Instructions

## 2020-10-28 DIAGNOSIS — Z1231 Encounter for screening mammogram for malignant neoplasm of breast: Secondary | ICD-10-CM | POA: Diagnosis not present

## 2020-10-28 LAB — HM MAMMOGRAPHY

## 2020-11-02 ENCOUNTER — Encounter: Payer: Self-pay | Admitting: Internal Medicine

## 2020-11-02 DIAGNOSIS — R922 Inconclusive mammogram: Secondary | ICD-10-CM | POA: Diagnosis not present

## 2020-11-02 DIAGNOSIS — R928 Other abnormal and inconclusive findings on diagnostic imaging of breast: Secondary | ICD-10-CM | POA: Diagnosis not present

## 2020-11-04 ENCOUNTER — Encounter: Payer: Medicare Other | Admitting: Adult Health

## 2020-11-05 ENCOUNTER — Other Ambulatory Visit: Payer: Self-pay | Admitting: Internal Medicine

## 2020-11-10 ENCOUNTER — Encounter: Payer: Self-pay | Admitting: Internal Medicine

## 2020-11-10 NOTE — Progress Notes (Signed)
Outside notes received. Information abstracted. Notes sent to scan.  

## 2020-11-14 DIAGNOSIS — L439 Lichen planus, unspecified: Secondary | ICD-10-CM | POA: Diagnosis not present

## 2020-11-18 ENCOUNTER — Other Ambulatory Visit: Payer: Self-pay

## 2020-11-18 DIAGNOSIS — E785 Hyperlipidemia, unspecified: Secondary | ICD-10-CM | POA: Diagnosis not present

## 2020-11-18 LAB — COMPREHENSIVE METABOLIC PANEL
ALT: 30 IU/L (ref 0–32)
AST: 44 IU/L — ABNORMAL HIGH (ref 0–40)
Albumin/Globulin Ratio: 1.8 (ref 1.2–2.2)
Albumin: 4.5 g/dL (ref 3.8–4.8)
Alkaline Phosphatase: 75 IU/L (ref 44–121)
BUN/Creatinine Ratio: 22 (ref 12–28)
BUN: 13 mg/dL (ref 8–27)
Bilirubin Total: 0.5 mg/dL (ref 0.0–1.2)
CO2: 24 mmol/L (ref 20–29)
Calcium: 9.2 mg/dL (ref 8.7–10.3)
Chloride: 104 mmol/L (ref 96–106)
Creatinine, Ser: 0.6 mg/dL (ref 0.57–1.00)
Globulin, Total: 2.5 g/dL (ref 1.5–4.5)
Glucose: 106 mg/dL — ABNORMAL HIGH (ref 70–99)
Potassium: 4.2 mmol/L (ref 3.5–5.2)
Sodium: 143 mmol/L (ref 134–144)
Total Protein: 7 g/dL (ref 6.0–8.5)
eGFR: 97 mL/min/{1.73_m2} (ref >59)

## 2020-11-18 LAB — LIPID PANEL
Chol/HDL Ratio: 3.6 ratio (ref 0.0–4.4)
Cholesterol, Total: 133 mg/dL (ref 100–199)
HDL: 37 mg/dL — ABNORMAL LOW (ref >39)
LDL Chol Calc (NIH): 76 mg/dL (ref 0–99)
Triglycerides: 106 mg/dL (ref 0–149)
VLDL Cholesterol Cal: 20 mg/dL (ref 5–40)

## 2020-11-22 ENCOUNTER — Encounter: Payer: Medicare Other | Admitting: Adult Health

## 2020-11-29 ENCOUNTER — Encounter: Payer: Self-pay | Admitting: Adult Health

## 2020-11-29 ENCOUNTER — Inpatient Hospital Stay: Payer: Medicare Other | Attending: Adult Health | Admitting: Adult Health

## 2020-11-29 ENCOUNTER — Other Ambulatory Visit: Payer: Self-pay

## 2020-11-29 VITALS — BP 159/75 | HR 61 | Temp 97.9°F | Resp 18 | Ht 62.0 in | Wt 201.6 lb

## 2020-11-29 DIAGNOSIS — Z853 Personal history of malignant neoplasm of breast: Secondary | ICD-10-CM | POA: Diagnosis not present

## 2020-11-29 DIAGNOSIS — C50312 Malignant neoplasm of lower-inner quadrant of left female breast: Secondary | ICD-10-CM | POA: Diagnosis not present

## 2020-11-29 DIAGNOSIS — M8580 Other specified disorders of bone density and structure, unspecified site: Secondary | ICD-10-CM | POA: Diagnosis not present

## 2020-11-29 DIAGNOSIS — Z923 Personal history of irradiation: Secondary | ICD-10-CM | POA: Diagnosis not present

## 2020-11-29 DIAGNOSIS — Z79899 Other long term (current) drug therapy: Secondary | ICD-10-CM | POA: Insufficient documentation

## 2020-11-29 DIAGNOSIS — Z170 Estrogen receptor positive status [ER+]: Secondary | ICD-10-CM | POA: Diagnosis not present

## 2020-11-29 DIAGNOSIS — Z87891 Personal history of nicotine dependence: Secondary | ICD-10-CM | POA: Insufficient documentation

## 2020-11-29 NOTE — Progress Notes (Signed)
CLINIC:  Survivorship   REASON FOR VISIT:  Routine follow-up for history of breast cancer.   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  The Hereditary Gene Panel offered by [facility] includes sequencing and/or deletion duplication testing of the following 47 genes: APC, ATM, AXIN2, BARD1, BMPR1A, BRCA1, BRCA2, BRIP1, CDH1, CDK4, CDKN2A (p14ARF), CDKN2A (p16INK4a), CHEK2, CTNNA1, DICER1, EPCAM (Deletion/duplication testing only), GREM1 (promoter region deletion/duplication testing only), KIT, MEN1, MLH1, MSH2, MSH3, MSH6, MUTYH, NBN, NF1, NHTL1, PALB2, PDGFRA, PMS2, POLD1, POLE, PTEN, RAD50, RAD51C, RAD51D, SDHB, SDHC, SDHD, SMAD4, SMARCA4. STK11, TP53, TSC1, TSC2, and VHL.  The following genes were evaluated for sequence changes only: SDHA and HOXB13 c.251G>A variant only. The report date is February 19, 2017.      INTERVAL HISTORY:  Chelsea Moreno presents to the Survivorship [facility] today for routine follow-up for her history of breast cancer.    Overall, she reports feeling quite well. She sees her PCP regularly and is up to date with her mammogram, colonoscopy, GYN exams.  She underwent bone desntiy testing on 02/18/2019 that showed osteopenia with a t score of -1.6 in the spine.  Her mammogram was completed in 10/2020 and she was recalled from screening to diagnostic.  Further work up determined this area to be fat necrosis.    She is not exercising, and struggles with motivation to walk more.  She sees  her PCP regularly and is up to date with other cancer screening.    REVIEW OF SYSTEMS:  Review of Systems  Constitutional:  Negative for appetite change, chills, fatigue, fever and unexpected weight change.  HENT:   Negative for hearing loss, lump/mass, mouth sores and trouble swallowing.   Eyes:  Negative for eye problems and icterus.  Respiratory:  Negative for chest tightness, cough and shortness of breath.   Cardiovascular:  Negative for chest pain, leg swelling and palpitations.  Gastrointestinal:  Negative for abdominal distention, abdominal pain, diarrhea, nausea and vomiting.  Endocrine: Negative for hot flashes.  Genitourinary:  Negative for difficulty urinating.   Musculoskeletal:  Negative for arthralgias.  Skin:  Negative for itching and rash.  Neurological:  Negative for dizziness, extremity weakness, headaches and numbness.  Hematological:  Negative for adenopathy. Does not bruise/bleed easily.  Psychiatric/Behavioral:  Negative for depression. The patient is not nervous/anxious.  Breast: Denies any new nodularity, masses, tenderness, nipple changes, or nipple discharge.       PAST MEDICAL/SURGICAL HISTORY:  Past Medical History:  Diagnosis Date   Breast cancer ([place]) 11/05/12   left   Bursitis    COLONIC POLYPS, HX OF 09/24/2008   DEPRESSION 09/23/2007   takes Lexapro daily but hasn't taken since Sept 2014   Diabetes insipidus ([place])    takes Desmopressin bid   Diarrhea    Dizziness    when nauseated gets dizzy   Family history of ovarian cancer    Hemorrhoids    History of bronchitis    yrs ago   History of migraine  last time many yrs ago   Hx of radiation therapy 01/29/13- 03/16/13   left breast 4500 cGy 25 sessions, left breast boost 1000 cGy 5 sessions   HYPERLIPIDEMIA 09/06/2006   takes Atorvasatin nightly   HYPERTENSION 09/06/2006   takes Lisinopril daily   Muscle spasms of head and/or neck    OSTEOPOROSIS 09/24/2008   takes Actonel every 30days    Vitamin D deficiency    takes OTC Vit D   Past Surgical History:  Procedure Laterality Date   BREAST BIOPSY Left 38yr ago   BREAST LUMPECTOMY WITH NEEDLE LOCALIZATION Left 12/16/2012   Procedure: PARTIAL MASTECTOMY WITH NEEDLE LOCALIZATION;  Surgeon: FStark Klein MD;  [facility]  Service: General;  Laterality: Left;  1:00 NL at [facility]  No Known Allergies   CURRENT MEDICATIONS:  Outpatient Encounter Medications as of 11/29/2020  Medication Sig   atorvastatin (LIPITOR) 80 MG tablet TAKE 1 TABLET DAILY   desmopressin (DDAVP NASAL) 0.01 % solution Place 1 spray (10 mcg total) into the nose 2 (two) times daily. PLACE 1 SPRAY INTO EACH NOSTRIL 2 TIMES A DAY   dorzolamide-timolol (COSOPT) 22.3-6.8 MG/ML ophthalmic solution 1 drop 2 (two) times daily.   lisinopril (ZESTRIL) 40 MG tablet Take 1 tablet (40 mg total) by mouth daily.   metoprolol succinate (TOPROL-XL) 50 MG 24 hr tablet Take 1 tablet (50 mg total) by mouth daily. Take with or immediately following a meal.   NEXLIZET 180-10 MG TABS TAKE 1 TABLET DAILY   ondansetron (ZOFRAN ODT) 4 MG disintegrating tablet Take 1 tablet (4 mg total) by mouth every 8 (eight) hours as needed for nausea or vomiting.   VYZULTA 0.024 % SOLN INSTILL 1 DROP INTO EACH EYE ONCE DAILY AT BEDTIME   [DISCONTINUED] clobetasol ointment (TEMOVATE) 0.05 % Apply topically.   No facility-administered encounter medications on file as of 11/29/2020.     ONCOLOGIC FAMILY HISTORY:  Family History  Problem Relation Age of Onset   Ovarian cancer Mother    Cancer Mother        Ovarian   Heart disease Mother        Before age 26864  Heart attack Mother    Varicose Veins Mother    Heart disease Father        Before age 26840  Hyperlipidemia Father    Hypertension Father    Heart attack Father    Heart disease Sister        Before age 26886  Hypertension Sister    Heart attack Sister 56      second heart attack at 533   Heart disease Brother        Before age 26815  Hypertension Brother    Heart attack Brother 48  Cancer Maternal Aunt        GYN cancer   Heart attack Maternal Grandmother    Alcohol abuse Maternal Grandfather    Parkinson's disease Sister    ALS Brother     GENETIC COUNSELING/TESTING: See above  SOCIAL HISTORY:  Social History   Socioeconomic History   Marital status: Widowed    Spouse name: Not on file   Number of children: Not on file   Years of education: Not on file   Highest education level: Not on file  Occupational History   Occupation: Professor at [employer]   Smoking status: Former  Packs/day: 0.50    Years: 4.00    Pack years: 2.00    Types: Cigarettes    Quit date: 01/07/1973    Years since quitting: 47.9   Smokeless tobacco: Never   Tobacco comments:    quit 45yr ago  Substance and Sexual Activity   Alcohol use: Yes    Alcohol/week: 0.0 standard drinks    Comment: socailly   Drug use: No   Sexual activity: Not Currently    Birth control/protection: Post-menopausal    Comment: menarche age 70 first live birth age 70 menopause 2002, no HRT  Other Topics Concern   Not on file  Social History Narrative   Not on file   Social Determinants of Health   Financial Resource Strain: Low Risk    Difficulty of Paying Living Expenses: Not hard at all  Food Insecurity: No Food Insecurity   Worried About RCharity fundraiserin the Last Year: Never true   [place] the Last Year: Never true  Transportation Needs: No Transportation Needs   Lack of Transportation (Medical): No   Lack of Transportation (Non-Medical): No  Physical Activity: Insufficiently Active   Days of Exercise per Week: 1 day   Minutes of Exercise per Session: 60 min  Stress: No Stress Concern Present   Feeling of Stress : Not at all  Social Connections: Moderately Integrated   Frequency of Communication with Friends and Family: More than three times a week    Frequency of Social Gatherings with Friends and Family: Once a week   Attends Religious Services: More than 4 times per year   Active Member of [employer] Organizations: Yes   Attends CArchivistMeetings: More than 4 times per year   Marital Status: Widowed  IHuman resources officerViolence: Not At Risk   Fear of Current or Ex-Partner: No   Emotionally Abused: No   Physically Abused: No   Sexually Abused: No      PHYSICAL EXAMINATION:  Vital Signs: Vitals:   11/29/20 1047  BP: (!) 159/75  Pulse: 61  Resp: 18  Temp: 97.9 F (36.6 C)  SpO2: 100%   Filed Weights   11/29/20 1047  Weight: 201 lb 9.6 oz (91.4 kg)   General: Well-nourished, well-appearing female in no acute distress.  Unaccompanied today.   HEENT: Head is normocephalic.  Pupils equal and reactive to light. Conjunctivae clear without exudate.  Sclerae anicteric. Oral mucosa is pink, moist.  Oropharynx is pink without lesions or erythema.  Lymph: No cervical, supraclavicular, or infraclavicular lymphadenopathy noted on palpation.  Cardiovascular: Regular rate and rhythm..Marland KitchenRespiratory: Clear to auscultation bilaterally. Chest expansion symmetric; breathing non-labored.  Breast Exam:  -Left breast: No appreciable masses on palpation. No skin redness, thickening, or peau d'orange appearance; no nipple retraction or nipple discharge; mild distortion in symmetry at previous lumpectomy site well healed scar without erythema or nodularity.  -Right breast: No appreciable masses on palpation. No skin redness, thickening, or peau d'orange appearance; no nipple retraction or nipple discharge. -Axilla: No axillary adenopathy bilaterally.  GI: Abdomen soft and round; non-tender, non-distended. Bowel sounds normoactive. No hepatosplenomegaly.   GU: Deferred.  Neuro: No focal deficits. Steady gait.  Psych: Mood and affect normal and appropriate for situation.  MSK: No focal spinal tenderness to palpation, full range of motion in  bilateral upper extremities Extremities: No edema. Skin: Warm and dry.  LABORATORY DATA:  None for this visit   DIAGNOSTIC IMAGING:  Most recent mammogram:  Normal, completed last week, not yet scanned    ASSESSMENT AND PLAN:  Chelsea Moreno is a pleasant 70 y.o. female with history of Stage 0 left breast DCIS, ER+/PR+, diagnosed in 10/2012, treated with lumpectomy, adjuvant radiation therapy, and anti-estrogen therapy with Tamoxifen beginning in 03/2013 for 5 years.  She presents to the Survivorship Clinic for surveillance and routine follow-up.   1. History of breast cancer:  Chelsea Moreno is currently clinically and radiographically without evidence of disease or recurrence of breast cancer.  Chelsea Moreno will repeat her mammogram next year.  We will see her again in 1 year for continued surveillance and monitoring at her request.  I encouraged her to call me with any questions or concerns before her next visit at the cancer center, and I would be happy to see her sooner, if needed.    2. Bone health:  Given Chelsea Moreno's age/history of breast cancer, she is at risk for bone demineralization. Her last DEXA scan in 02/2019 showed osteopenia.  I gave her detailed information about bone health and her after visit summary.  3. Cancer screening:  Due to Chelsea Moreno's history and her age, she should receive screening for skin cancers, colon cancer, and gynecologic cancers. She was encouraged to follow-up with her PCP for appropriate cancer screenings.   4. Health maintenance and wellness promotion: Chelsea Moreno was encouraged to consume 5-7 servings of fruits and vegetables per day. She was also encouraged to engage in moderate to vigorous exercise for 30 minutes per day most days of the week. She was instructed to limit her alcohol consumption and continue to abstain from tobacco use.    Dispo:  -Return to cancer center in one year for LTS f/u -Mammogram in 10/2021   Total encounter time: 20 minutes*in  face-to-face visit time, chart review, order entry, care coordination, and documentation of encounter.   Gardenia Phlegm, NP [facility] [phone]  *Total Encounter Time as defined by the Centers for Medicare and Medicaid Services includes, in addition to the face-to-face time of a patient visit (documented in the note above) non-face-to-face time: obtaining and reviewing outside history, ordering and reviewing medications, tests or procedures, care coordination (communications with other health care professionals or caregivers) and documentation in the medical record.    Note: PRIMARY CARE PROVIDER Binnie Rail, [place] [phone]

## 2020-11-29 NOTE — Patient Instructions (Signed)
Bone Health Bones protect organs, store calcium, anchor muscles, and support the whole body. Keeping your bones strong is important, especially as you get older. You can take actions to help keep your bones strong and healthy. Why is keeping my bones healthy important? Keeping your bones healthy is important because your body constantly replaces bone cells. Cells get old, and new cells take their place. As we age, we lose bone cells because the body may not be able to make enough new cells to replace the old cells. The amount of bone cells and bone tissue you have is referred to as bone mass. The higher your bone mass, the stronger your bones. The aging process leads to an overall loss of bone mass in the body, which can increase the likelihood of: Broken bones. A condition in which the bones become weak and brittle (osteoporosis). A large decline in bone mass occurs in older adults. In women, it occurs about the time of menopause. What actions can I take to keep my bones healthy? Good health habits are important for maintaining healthy bones. This includes eating nutritious foods and exercising regularly. To have healthy bones, you need to get enough of the right minerals and vitamins. Most nutrition experts recommend getting these nutrients from the foods that you eat. In some cases, taking supplements may also be recommended. Doing certain types of exercise is also important for bone health. What are the nutritional recommendations for healthy bones? Eating a well-balanced diet with plenty of calcium and vitamin D will help to protect your bones. Nutritional recommendations vary from person to person. Ask your health care provider what is healthy for you. Here are some general guidelines. Get enough calcium Calcium is the most important (essential) mineral for bone health. Most people can get enough calcium from their diet, but supplements may be recommended for people who are at risk for  osteoporosis. Good sources of calcium include: Dairy products, such as low-fat or nonfat milk, cheese, and yogurt. Dark green leafy vegetables, such as bok choy and broccoli. Foods that have calcium added to them (are fortified). Foods that may be fortified with calcium include orange juice, cereal, bread, soy beverages, and tofu products. Nuts, such as almonds. Follow these recommended amounts for daily calcium intake: Infants, 0-6 months: 200 mg. Infants, 6-12 months: 260 mg. Children, age 31-3: 700 mg. Children, age 43-8: 1,000 mg. Children, age 53-13: 1,300 mg. Teens, age 74-18: 1,300 mg. Adults, age 60-50: 1,000 mg. Adults, age 65-70: Men: 1,000 mg. Women: 1,200 mg. Adults, age 53 or older: 1,200 mg. Pregnant and breastfeeding females: Teens: 1,300 mg. Adults: 1,000 mg. Get enough vitamin D Vitamin D is the most essential vitamin for bone health. It helps the body absorb calcium. Sunlight stimulates the skin to make vitamin D, so be sure to get enough sunlight. If you live in a cold climate or you do not get outside often, your health care provider may recommend that you take vitamin D supplements. Good sources of vitamin D in your diet include: Egg yolks. Saltwater fish. Milk and cereal fortified with vitamin D. Follow these recommended amounts for daily vitamin D intake: Infants, 0-12 months: 400 international units (IU). Children and teens, age 31-18: 40 international units. Adults, age 84 or younger: 8 international units. Adults, age 35 or older: 53-1,000 international units. Get other important nutrients Other nutrients that are important for bone health include: Phosphorus. This mineral is found in meat, poultry, dairy foods, nuts, and legumes. The recommended daily  intake for adult men and adult women is 700 mg. Magnesium. This mineral is found in seeds, nuts, dark green vegetables, and legumes. The recommended daily intake for adult men is 400-420 mg. For adult women,  it is 310-320 mg. Vitamin K. This vitamin is found in green leafy vegetables. The recommended daily intake is 120 mcg for adult men and 90 mcg for adult women. What type of physical activity is best for building and maintaining healthy bones? Weight-bearing and strength-building activities are important for building and maintaining healthy bones. Weight-bearing activities cause muscles and bones to work against gravity. Strength-building activities increase the strength of the muscles that support bones. Weight-bearing and muscle-building activities include: Walking and hiking. Jogging and running. Dancing. Gym exercises. Lifting weights. Tennis and racquetball. Climbing stairs. Aerobics. Adults should get at least 30 minutes of moderate physical activity on most days. Children should get at least 60 minutes of moderate physical activity on most days. Ask your health care provider what type of exercise is best for you. How can I find out if my bone mass is low? Bone mass can be measured with an X-ray test called a bone mineral density (BMD) test. This test is recommended for all women who are age 22 or older. It may also be recommended for: Men who are age 57 or older. People who are at risk for osteoporosis because of: Having a long-term disease that weakens bones, such as kidney disease or rheumatoid arthritis. Having menopause earlier than normal. Taking medicine that weakens bones, such as steroids, thyroid hormones, or hormone treatment for breast cancer or prostate cancer. Smoking. Drinking three or more alcoholic drinks a day. Being underweight. Sedentary lifestyle. If you find that you have a low bone mass, you may be able to prevent osteoporosis or further bone loss by changing your diet and lifestyle. Where can I find more information? Bone Health & Osteoporosis Foundation: [employer] of Health: www.bones.SouthExposed.es International Osteoporosis  Foundation: Administrator.iofbonehealth.org Summary The aging process leads to an overall loss of bone mass in the body, which can increase the likelihood of broken bones and osteoporosis. Eating a well-balanced diet with plenty of calcium and vitamin D will help to protect your bones. Weight-bearing and strength-building activities are also important for building and maintaining strong bones. Bone mass can be measured with an X-ray test called a bone mineral density (BMD) test. This information is not intended to replace advice given to you by your health care provider. Make sure you discuss any questions you have with your health care provider. Document Revised: 06/08/2020 Document Reviewed: 06/08/2020 Elsevier Patient Education  [employer].

## 2020-12-16 DIAGNOSIS — H401131 Primary open-angle glaucoma, bilateral, mild stage: Secondary | ICD-10-CM | POA: Diagnosis not present

## 2021-01-25 ENCOUNTER — Other Ambulatory Visit: Payer: Self-pay | Admitting: Internal Medicine

## 2021-02-01 DIAGNOSIS — E785 Hyperlipidemia, unspecified: Secondary | ICD-10-CM | POA: Diagnosis not present

## 2021-02-01 LAB — HEPATIC FUNCTION PANEL
ALT: 31 IU/L (ref 0–32)
AST: 39 IU/L (ref 0–40)
Albumin: 4.6 g/dL (ref 3.8–4.8)
Alkaline Phosphatase: 76 IU/L (ref 44–121)
Bilirubin Total: 0.7 mg/dL (ref 0.0–1.2)
Bilirubin, Direct: 0.19 mg/dL (ref 0.00–0.40)
Total Protein: 6.9 g/dL (ref 6.0–8.5)

## 2021-02-01 LAB — LIPID PANEL
Chol/HDL Ratio: 3.6 ratio (ref 0.0–4.4)
Cholesterol, Total: 132 mg/dL (ref 100–199)
HDL: 37 mg/dL — ABNORMAL LOW (ref >39)
LDL Chol Calc (NIH): 75 mg/dL (ref 0–99)
Triglycerides: 110 mg/dL (ref 0–149)
VLDL Cholesterol Cal: 20 mg/dL (ref 5–40)

## 2021-02-20 ENCOUNTER — Telehealth: Payer: Self-pay

## 2021-02-20 NOTE — Telephone Encounter (Signed)
Called to schedule leqvio injection and discuss cost but had to leave a message for the patient to call back

## 2021-02-22 ENCOUNTER — Other Ambulatory Visit: Payer: Self-pay

## 2021-02-22 DIAGNOSIS — E785 Hyperlipidemia, unspecified: Secondary | ICD-10-CM

## 2021-02-27 ENCOUNTER — Other Ambulatory Visit: Payer: Self-pay | Admitting: Internal Medicine

## 2021-03-02 ENCOUNTER — Other Ambulatory Visit (HOSPITAL_COMMUNITY): Payer: Self-pay | Admitting: *Deleted

## 2021-03-03 ENCOUNTER — Other Ambulatory Visit: Payer: Self-pay

## 2021-03-03 ENCOUNTER — Ambulatory Visit (HOSPITAL_COMMUNITY)
Admission: RE | Admit: 2021-03-03 | Discharge: 2021-03-03 | Disposition: A | Discharge status: Home / Self Care | Payer: Medicare Other | Source: Ambulatory Visit | Attending: Cardiology | Admitting: Cardiology

## 2021-03-03 DIAGNOSIS — E785 Hyperlipidemia, unspecified: Secondary | ICD-10-CM | POA: Insufficient documentation

## 2021-03-03 MED ORDER — INCLISIRAN SODIUM 284 MG/1.5ML ~~LOC~~ SOSY
284.0000 mg | PREFILLED_SYRINGE | Freq: Once | SUBCUTANEOUS | Status: AC
  Administered 2021-03-03: 284 mg via SUBCUTANEOUS

## 2021-03-03 MED ORDER — INCLISIRAN SODIUM 284 MG/1.5ML ~~LOC~~ SOSY
PREFILLED_SYRINGE | SUBCUTANEOUS | Status: AC
  Filled 2021-03-03: qty 1.5

## 2021-03-15 ENCOUNTER — Other Ambulatory Visit: Payer: Self-pay | Admitting: Cardiology

## 2021-03-22 ENCOUNTER — Other Ambulatory Visit: Payer: Self-pay

## 2021-03-22 DIAGNOSIS — R931 Abnormal findings on diagnostic imaging of heart and coronary circulation: Secondary | ICD-10-CM

## 2021-03-22 DIAGNOSIS — E785 Hyperlipidemia, unspecified: Secondary | ICD-10-CM

## 2021-03-27 ENCOUNTER — Ambulatory Visit (INDEPENDENT_AMBULATORY_CARE_PROVIDER_SITE_OTHER): Payer: Medicare Other

## 2021-03-27 DIAGNOSIS — Z0000 Encounter for general adult medical examination without abnormal findings: Secondary | ICD-10-CM

## 2021-03-27 NOTE — Progress Notes (Signed)
? ?Subjective:  ? Chelsea Moreno is a 71 y.o. female who presents for Medicare Annual (Subsequent) preventive examination. ? ? ?I connected with Lorella Gomez today by telephone and verified that I am speaking with the correct person using two identifiers. ?Location patient: home ?Location provider: work ?Persons participating in the virtual visit: patient, provider. ?  ?I discussed the limitations, risks, security and privacy concerns of performing an evaluation and management service by telephone and the availability of in person appointments. I also discussed with the patient that there may be a patient responsible charge related to this service. The patient expressed understanding and verbally consented to this telephonic visit.  ?  ?Interactive audio and video telecommunications were attempted between this provider and patient, however failed, due to patient having technical difficulties OR patient did not have access to video capability.  We continued and completed visit with audio only. ? ?  ?Review of Systems    ? ?Cardiac Risk Factors include: advanced age (>51mn, >>61women);diabetes mellitus ? ?   ?Objective:  ?  ?Today's Vitals  ? ?There is no height or weight on file to calculate BMI. ? ?Advanced Directives 03/27/2021 03/09/2020 08/10/2016 05/30/2015 08/13/2014 05/20/2014 11/25/2013  ?Does Patient Have a Medical Advance Directive? Yes Yes No No No No No  ?Type of [place] Living will;Healthcare Power of Attorney - - - - -  ?Does patient want to make changes to medical advance directive? - No - Patient declined - - - - -  ?Copy of [place]? No - copy requested No - copy requested - - - - -  ?Would patient like information on creating a medical advance directive? - - - - Yes - Educational materials given - No - patient declined information  ?Pre-existing out of facility DNR order (yellow form or pink MOST form) - - - - - - -   ? ? ?Current Medications (verified) ?Outpatient Encounter Medications as of 03/27/2021  ?Medication Sig  ? atorvastatin (LIPITOR) 80 MG tablet TAKE 1 TABLET DAILY  ? Brinzolamide-Brimonidine (SIMBRINZA) 1-0.2 % SUSP Apply to eye.  ? desmopressin (DDAVP NASAL) 0.01 % solution USE 1 SPRAY (10 MCG) IN EACH NOSTRIL TWICE A DAY  ? inclisiran (LEQVIO) 284 MG/1.5ML SOSY injection Inject 284 mg into the skin every 6 (six) months.  ? lisinopril (ZESTRIL) 40 MG tablet Take 1 tablet (40 mg total) by mouth daily.  ? metoprolol succinate (TOPROL-XL) 50 MG 24 hr tablet TAKE 1 TABLET DAILY. TAKE WITH OR IMMEDIATELY FOLLOWING A MEAL.  ? NEXLIZET 180-10 MG TABS TAKE 1 TABLET DAILY  ? ondansetron (ZOFRAN ODT) 4 MG disintegrating tablet Take 1 tablet (4 mg total) by mouth every 8 (eight) hours as needed for nausea or vomiting.  ? VYZULTA 0.024 % SOLN INSTILL 1 DROP INTO EACH EYE ONCE DAILY AT BEDTIME  ? dorzolamide-timolol (COSOPT) 22.3-6.8 MG/ML ophthalmic solution 1 drop 2 (two) times daily.  ? ?No facility-administered encounter medications on file as of 03/27/2021.  ? ? ?Allergies (verified) ?Patient has no known allergies.  ? ?History: ?Past Medical History:  ?Diagnosis Date  ? Breast cancer [place] 11/05[place]  ? left  ? Bursitis   ? COLONIC POLYPS, HX OF 09/24/2008  ? DEPRESSION 09/23/2007  ? takes Lexapro daily but hasn't taken since Sept 2014  ? [place]id[place]   ? takes Desmopressin bid  ? Diarrhea   ? Dizziness   ? when nauseated gets dizzy  ?  Family history of ovarian cancer   ? Hemorrhoids   ? History of bronchitis   ? yrs ago  ? History of migraine   ? last time many yrs ago  ? Hx of radiation therapy 01/29/13- 03/16/13  ? left breast 4500 cGy 25 sessions, left breast boost 1000 cGy 5 sessions  ? HYPERLIPIDEMIA 09/06/2006  ? takes Atorvasatin nightly  ? HYPERTENSION 09/06/2006  ? takes Lisinopril daily  ? Muscle spasms of head and/or neck   ? OSTEOPOROSIS 09/24/2008  ? takes Actonel every 30days  ? Vitamin D deficiency   ?  takes OTC Vit D  ? ?Past Surgical History:  ?Procedure Laterality Date  ? BREAST BIOPSY Left 106yr ago  ? BREAST LUMPECTOMY WITH NEEDLE LOCALIZATION Left 12/16/2012  ? Procedure: PARTIAL MASTECTOMY WITH NEEDLE LOCALIZATION;  Surgeon: FStark Klein MD;  [facility]  Service: General;  Laterality: [facility];  [facility]  ? COLONOSCOPY    ? ?Family History  ?Problem Relation Age of Onset  ? Ovarian cancer Mother   ? Cancer Mother   ?     Ovarian  ? Heart disease Mother   ?     Before age 71 ? Heart attack Mother   ? Varicose Veins Mother   ? Heart disease Father   ?     Before age 2289 ? Hyperlipidemia Father   ? Hypertension Father   ? Heart attack Father   ? Heart disease Sister   ?     Before age 71 ? Hypertension Sister   ? Heart attack Sister 535 ?     second heart attack at 53 ? Heart disease Brother   ?     Before age 71 ? Hypertension Brother   ? Heart attack Brother 452 ? Cancer Maternal Aunt   ?     GYN cancer  ? Heart attack Maternal Grandmother   ? Alcohol abuse Maternal Grandfather   ? Parkinson's disease Sister   ? ALS Brother   ? ?Social History  ? ?Socioeconomic History  ? Marital status: Widowed  ?  Spouse name: Not on file  ? Number of children: Not on file  ? Years of education: Not on file  ? Highest education level: Not on file  ?Occupational History  ? Occupation: Professor at [employer] ?Tobacco Use  ? Smoking status: Former  ?  Packs/day: 0.50  ?  Years: 4.00  ?  Pack years: 2.00  ?  Types: Cigarettes  ?  Quit date: 01/07/1973  ?  Years since quitting: 48.2  ? Smokeless tobacco: Never  ? Tobacco comments:  ?  quit [id number]  ?Substance and Sexual Activity  ? Alcohol use: Yes  ?  Alcohol/week: 0.0 standard drinks  ?  Comment: socailly  ? Drug use: No  ? Sexual activity: Not Currently  ?  Birth control/protection: Post-menopausal  ?  Comment: menarche age [age]first live birth age [age]menopause 2002, no HRT  ?Other Topics Concern  ? Not on file  ?Social History Narrative  ? Not on file   ? ?Social Determinants of Health  ? ?Financial Resource Strain: Low Risk   ? Difficulty of Paying Living Expenses: Not hard at all  ?Food Insecurity: No Food Insecurity  ? Worried About RuCharity fundraisern the Last Year: Never true  ? Ran Out of Food in the Last Year: Never true  ?Transportation Needs: No Transportation  Needs  ? Lack of Transportation (Medical): No  ? Lack of Transportation (Non-Medical): No  ?Physical Activity: Insufficiently Active  ? Days of Exercise per Week: 2 days  ? Minutes of Exercise per Session: 30 min  ?Stress: No Stress Concern Present  ? Feeling of Stress : Not at all  ?Social Connections: Moderately Isolated  ? Frequency of Communication with Friends and Family: Twice a week  ? Frequency of Social Gatherings with Friends and Family: Twice a week  ? Attends Religious Services: Never  ? Active Member of Clubs or Organizations: Yes  ? Attends Archivist Meetings: More than 4 times per year  ? Marital Status: Widowed  ? ? ?Tobacco Counseling ?Counseling given: Not Answered ?Tobacco comments: quit 7yr ago ? ? ?Clinical Intake: ? ?Pre-visit preparation completed: Yes ? ?Pain : No/denies pain ? ?  ? ?Nutritional Risks: None ?Diabetes: No ? ?How often do you need to have someone help you when you read instructions, pamphlets, or other written materials from your doctor or pharmacy?: 1 - Never ?What is the last grade level you completed in school?: PHD ? ?Diabetic?no  ? ?Interpreter Needed?: No ? ?Information entered by :: [email or web address]? ? ?Activities of Daily Living ?In your present state of health, do you have any difficulty performing the following activities: 03/27/2021  ?Hearing? N  ?Vision? N  ?Difficulty concentrating or making decisions? N  ?Walking or climbing stairs? N  ?Dressing or bathing? N  ?Doing errands, shopping? N  ?Preparing Food and eating ? N  ?Using the Toilet? N  ?In the past six months, have you accidently leaked urine? N  ?Do you have problems with loss of  bowel control? N  ?Managing your Medications? N  ?Managing your Finances? N  ?Housekeeping or managing your Housekeeping? N  ?Some recent data might be hidden  ? ? ?Patient Care Team: ?BBinnie Rail MD as PCP - General

## 2021-03-27 NOTE — Patient Instructions (Signed)
Chelsea Moreno , ?Thank you for taking time to come for your Medicare Wellness Visit. I appreciate your ongoing commitment to your health goals. Please review the following plan we discussed and let me know if I can assist you in the future.  ? ?Screening recommendations/referrals: ?Colonoscopy: 03/06/2013 ?Mammogram: 10/28/2020 ?Bone Density: 02/18/2019 ?Recommended yearly ophthalmology/optometry visit for glaucoma screening and checkup ?Recommended yearly dental visit for hygiene and checkup ? ?Vaccinations: ?Influenza vaccine: completed  ?Pneumococcal vaccine: completed  ?Tdap vaccine: due  ?Shingles vaccine: declined    ? ?Advanced directives: yes  ? ?Conditions/risks identified: none  ? ?Next appointment: none  ? ? ?Preventive Care 71 Years and Older, Female ?Preventive care refers to lifestyle choices and visits with your health care provider that can promote health and wellness. ?What does preventive care include? ?A yearly physical exam. This is also called an annual well check. ?Dental exams once or twice a year. ?Routine eye exams. Ask your health care provider how often you should have your eyes checked. ?Personal lifestyle choices, including: ?Daily care of your teeth and gums. ?Regular physical activity. ?Eating a healthy diet. ?Avoiding tobacco and drug use. ?Limiting alcohol use. ?Practicing safe sex. ?Taking low-dose aspirin every day. ?Taking vitamin and mineral supplements as recommended by your health care provider. ?What happens during an annual well check? ?The services and screenings done by your health care provider during your annual well check will depend on your age, overall health, lifestyle risk factors, and family history of disease. ?Counseling  ?Your health care provider may ask you questions about your: ?Alcohol use. ?Tobacco use. ?Drug use. ?Emotional well-being. ?Home and relationship well-being. ?Sexual activity. ?Eating habits. ?History of falls. ?Memory and ability to understand  (cognition). ?Work and work Statistician. ?Reproductive health. ?Screening  ?You may have the following tests or measurements: ?Height, weight, and BMI. ?Blood pressure. ?Lipid and cholesterol levels. These may be checked every 5 years, or more frequently if you are over 10 years old. ?Skin check. ?Lung cancer screening. You may have this screening every year starting at age 14 if you have a 30-pack-year history of smoking and currently smoke or have quit within the past 15 years. ?Fecal occult blood test (FOBT) of the stool. You may have this test every year starting at age 37. ?Flexible sigmoidoscopy or colonoscopy. You may have a sigmoidoscopy every 5 years or a colonoscopy every 10 years starting at age 83. ?Hepatitis C blood test. ?Hepatitis B blood test. ?Sexually transmitted disease (STD) testing. ?Diabetes screening. This is done by checking your blood sugar (glucose) after you have not eaten for a while (fasting). You may have this done every 1-3 years. ?Bone density scan. This is done to screen for osteoporosis. You may have this done starting at age 72. ?Mammogram. This may be done every 1-2 years. Talk to your health care provider about how often you should have regular mammograms. ?Talk with your health care provider about your test results, treatment options, and if necessary, the need for more tests. ?Vaccines  ?Your health care provider may recommend certain vaccines, such as: ?Influenza vaccine. This is recommended every year. ?Tetanus, diphtheria, and acellular pertussis (Tdap, Td) vaccine. You may need a Td booster every 10 years. ?Zoster vaccine. You may need this after age 19. ?Pneumococcal 13-valent conjugate (PCV13) vaccine. One dose is recommended after age 57. ?Pneumococcal polysaccharide (PPSV23) vaccine. One dose is recommended after age 74. ?Talk to your health care provider about which screenings and vaccines you need and how often  you need them. ?This information is not intended to  replace advice given to you by your health care provider. Make sure you discuss any questions you have with your health care provider. ?Document Released: 01/21/2015 Document Revised: 09/14/2015 Document Reviewed: 10/26/2014 ?Elsevier Interactive Patient Education ? 2017 [place]. ? ?Fall Prevention in the Home ?Falls can cause injuries. They can happen to people of all ages. There are many things you can do to make your home safe and to help prevent falls. ?What can I do on the outside of my home? ?Regularly fix the edges of walkways and driveways and fix any cracks. ?Remove anything that might make you trip as you walk through a door, such as a raised step or threshold. ?Trim any bushes or trees on the path to your home. ?Use bright outdoor lighting. ?Clear any walking paths of anything that might make someone trip, such as rocks or tools. ?Regularly check to see if handrails are loose or broken. Make sure that both sides of any steps have handrails. ?Any raised decks and porches should have guardrails on the edges. ?Have any leaves, snow, or ice cleared regularly. ?Use sand or salt on walking paths during winter. ?Clean up any spills in your garage right away. This includes oil or grease spills. ?What can I do in the bathroom? ?Use night lights. ?Install grab bars by the toilet and in the tub and shower. Do not use towel bars as grab bars. ?Use non-skid mats or decals in the tub or shower. ?If you need to sit down in the shower, use a plastic, non-slip stool. ?Keep the floor dry. Clean up any water that spills on the floor as soon as it happens. ?Remove soap buildup in the tub or shower regularly. ?Attach bath mats securely with double-sided non-slip rug tape. ?Do not have throw rugs and other things on the floor that can make you trip. ?What can I do in the bedroom? ?Use night lights. ?Make sure that you have a light by your bed that is easy to reach. ?Do not use any sheets or blankets that are too big for  your bed. They should not hang down onto the floor. ?Have a firm chair that has side arms. You can use this for support while you get dressed. ?Do not have throw rugs and other things on the floor that can make you trip. ?What can I do in the kitchen? ?Clean up any spills right away. ?Avoid walking on wet floors. ?Keep items that you use a lot in easy-to-reach places. ?If you need to reach something above you, use a strong step stool that has a grab bar. ?Keep electrical cords out of the way. ?Do not use floor polish or wax that makes floors slippery. If you must use wax, use non-skid floor wax. ?Do not have throw rugs and other things on the floor that can make you trip. ?What can I do with my stairs? ?Do not leave any items on the stairs. ?Make sure that there are handrails on both sides of the stairs and use them. Fix handrails that are broken or loose. Make sure that handrails are as long as the stairways. ?Check any carpeting to make sure that it is firmly attached to the stairs. Fix any carpet that is loose or worn. ?Avoid having throw rugs at the top or bottom of the stairs. If you do have throw rugs, attach them to the floor with carpet tape. ?Make sure that you have a light  switch at the top of the stairs and the bottom of the stairs. If you do not have them, ask someone to add them for you. ?What else can I do to help prevent falls? ?Wear shoes that: ?Do not have high heels. ?Have rubber bottoms. ?Are comfortable and fit you well. ?Are closed at the toe. Do not wear sandals. ?If you use a stepladder: ?Make sure that it is fully opened. Do not climb a closed stepladder. ?Make sure that both sides of the stepladder are locked into place. ?Ask someone to hold it for you, if possible. ?Clearly mark and make sure that you can see: ?Any grab bars or handrails. ?First and last steps. ?Where the edge of each step is. ?Use tools that help you move around (mobility aids) if they are needed. These  include: ?Canes. ?Walkers. ?Scooters. ?Crutches. ?Turn on the lights when you go into a dark area. Replace any light bulbs as soon as they burn out. ?Set up your furniture so you have a clear path. Avoid moving your furniture aro

## 2021-03-28 NOTE — Patient Instructions (Addendum)
? ? ? ?  Blood work was ordered.   ? ? ?Medications changes include :   none ? ? ? ? ?Return in about 6 months (around 09/29/2021) for follow up. ? ?

## 2021-03-28 NOTE — Progress Notes (Signed)
? ? ? ? ?Subjective:  ? ? Patient ID: Chelsea Moreno, female    DOB: 08/20/1950, 72 y.o.   MRN: [id number] ? ?This visit occurred during the SARS-CoV-2 public health emergency.  Safety protocols were in place, including screening questions prior to the visit, additional usage of staff PPE, and extensive cleaning of exam room while observing appropriate contact time as indicated for disinfecting solutions.   ? ? ?HPI ?Chelsea Moreno is here for follow up of her chronic medical problems, including HTN, CAD, prediabetes, HLD, OSA, diabetes insipidus, osteopenia ? ?Not exercising regularly -- goes yoga 1-2 times a week, walking 1-2 times a week.  ? ?  Injection for cholesterol Leqvio ? ? ?Lichen planus - real problem - can not use a cpap regularly ( dries mouth out too much and has bleeding), does not enjoy eating several things. ? ?Has been very tired recently - can take a 6 hr nap during the day and still sleep well at night. ? ?Medications and allergies reviewed with patient and updated if appropriate. ? ?Current Outpatient Medications on File Prior to Visit  ?Medication Sig Dispense Refill  ? atorvastatin (LIPITOR) 80 MG tablet TAKE 1 TABLET DAILY 90 tablet 3  ? Brinzolamide-Brimonidine (SIMBRINZA) 1-0.2 % SUSP Apply to eye.    ? desmopressin (DDAVP NASAL) 0.01 % solution USE 1 SPRAY (10 MCG) IN EACH NOSTRIL TWICE A DAY 10 mL 13  ? inclisiran (LEQVIO) 284 MG/1.5ML SOSY injection Inject 284 mg into the skin every 6 (six) months.    ? lisinopril (ZESTRIL) 40 MG tablet Take 1 tablet (40 mg total) by mouth daily. 90 tablet 3  ? magic mouthwash w/lidocaine SOLN     ? metoprolol succinate (TOPROL-XL) 50 MG 24 hr tablet TAKE 1 TABLET DAILY. TAKE WITH OR IMMEDIATELY FOLLOWING A MEAL. 90 tablet 3  ? NEXLIZET 180-10 MG TABS TAKE 1 TABLET DAILY 90 tablet 3  ? ondansetron (ZOFRAN ODT) 4 MG disintegrating tablet Take 1 tablet (4 mg total) by mouth every 8 (eight) hours as needed for nausea or vomiting. 30 tablet 0  ? VYZULTA 0.024 % SOLN  INSTILL 1 DROP INTO EACH EYE ONCE DAILY AT BEDTIME    ? ?No current facility-administered medications on file prior to visit.  ? ? ? ?Review of Systems  ?Constitutional:  Positive for fatigue. Negative for chills and fever.  ?HENT:  Positive for congestion.   ?Respiratory:  Negative for cough, shortness of breath and wheezing.   ?Cardiovascular:  Negative for chest pain, palpitations and leg swelling.  ?Gastrointestinal:  Negative for abdominal pain, constipation and diarrhea.  ?Musculoskeletal:  Positive for arthralgias (chronic right hip - no change, no new joint pain).  ?Skin:  Negative for rash.  ?Neurological:  Negative for light-headedness and headaches.  ?Psychiatric/Behavioral:  Positive for sleep disturbance.   ? ?   ?Objective:  ? ?Vitals:  ? 03/29/21 1105  ?BP: 140/72  ?Pulse: 62  ?Temp: 98.1 ?F (36.7 ?C)  ?SpO2: 96%  ? ?BP Readings from Last 3 Encounters:  ?03/29/21 140/72  ?03/03/21 (!) 132/52  ?11/29/20 (!) 159/75  ? ?Wt Readings from Last 3 Encounters:  ?03/29/21 198 lb 3.2 oz (89.9 kg)  ?11/29/20 201 lb 9.6 oz (91.4 kg)  ?10/17/20 202 lb 6.4 oz (91.8 kg)  ? ?Body mass index is 36.25 kg/m?. ? ?  ?Physical Exam ?Constitutional:   ?   General: She is not in acute distress. ?   Appearance: Normal appearance.  ?HENT:  ?  Head: Normocephalic and atraumatic.  ?Eyes:  ?   Conjunctiva/sclera: Conjunctivae normal.  ?Cardiovascular:  ?   Rate and Rhythm: Normal rate and regular rhythm.  ?   Heart sounds: Normal heart sounds. No murmur heard. ?Pulmonary:  ?   Effort: Pulmonary effort is normal. No respiratory distress.  ?   Breath sounds: Normal breath sounds. No wheezing.  ?Musculoskeletal:  ?   Cervical back: Neck supple.  ?   Right lower leg: No edema.  ?   Left lower leg: No edema.  ?Lymphadenopathy:  ?   Cervical: No cervical adenopathy.  ?Skin: ?   Findings: No rash.  ?Neurological:  ?   Mental Status: She is alert. Mental status is at baseline.  ?Psychiatric:     ?   Mood and Affect: Mood normal.     ?    Behavior: Behavior normal.  ? ?   ? ?Lab Results  ?Component Value Date  ? WBC 5.4 03/28/2020  ? HGB 12.9 03/28/2020  ? HCT 36.6 03/28/2020  ? PLT 239.0 03/28/2020  ? GLUCOSE 106 (H) 11/18/2020  ? CHOL 132 02/01/2021  ? TRIG 110 02/01/2021  ? HDL 37 (L) 02/01/2021  ? LDLDIRECT 171.8 03/21/2011  ? Botines 75 02/01/2021  ? ALT 31 02/01/2021  ? AST 39 02/01/2021  ? NA 143 11/18/2020  ? K 4.2 11/18/2020  ? CL 104 11/18/2020  ? CREATININE 0.60 11/18/2020  ? BUN 13 11/18/2020  ? CO2 24 11/18/2020  ? TSH 1.54 07/17/2017  ? HGBA1C 5.9 (A) 09/28/2020  ? HGBA1C 5.9 09/28/2020  ? HGBA1C 5.9 09/28/2020  ? HGBA1C 5.9 09/28/2020  ? ? ? ?Assessment & Plan:  ? ? ?See Problem List for Assessment and Plan of chronic medical problems.  ? ? ?

## 2021-03-29 ENCOUNTER — Other Ambulatory Visit: Payer: Self-pay

## 2021-03-29 ENCOUNTER — Encounter: Payer: Self-pay | Admitting: Internal Medicine

## 2021-03-29 ENCOUNTER — Ambulatory Visit (INDEPENDENT_AMBULATORY_CARE_PROVIDER_SITE_OTHER): Payer: Medicare Other | Admitting: Internal Medicine

## 2021-03-29 VITALS — BP 140/72 | HR 62 | Temp 98.1°F | Ht 62.0 in | Wt 198.2 lb

## 2021-03-29 DIAGNOSIS — R5383 Other fatigue: Secondary | ICD-10-CM | POA: Insufficient documentation

## 2021-03-29 DIAGNOSIS — R7303 Prediabetes: Secondary | ICD-10-CM

## 2021-03-29 DIAGNOSIS — I10 Essential (primary) hypertension: Secondary | ICD-10-CM | POA: Diagnosis not present

## 2021-03-29 DIAGNOSIS — M8588 Other specified disorders of bone density and structure, other site: Secondary | ICD-10-CM

## 2021-03-29 DIAGNOSIS — R931 Abnormal findings on diagnostic imaging of heart and coronary circulation: Secondary | ICD-10-CM

## 2021-03-29 DIAGNOSIS — G4733 Obstructive sleep apnea (adult) (pediatric): Secondary | ICD-10-CM | POA: Diagnosis not present

## 2021-03-29 DIAGNOSIS — L439 Lichen planus, unspecified: Secondary | ICD-10-CM

## 2021-03-29 DIAGNOSIS — E785 Hyperlipidemia, unspecified: Secondary | ICD-10-CM

## 2021-03-29 LAB — COMPREHENSIVE METABOLIC PANEL
ALT: 33 U/L (ref 0–35)
AST: 36 U/L (ref 0–37)
Albumin: 4.9 g/dL (ref 3.5–5.2)
Alkaline Phosphatase: 75 U/L (ref 39–117)
BUN: 12 mg/dL (ref 6–23)
CO2: 28 mEq/L (ref 19–32)
Calcium: 9.4 mg/dL (ref 8.4–10.5)
Chloride: 96 mEq/L (ref 96–112)
Creatinine, Ser: 0.66 mg/dL (ref 0.40–1.20)
GFR: 88.92 mL/min (ref >60.00)
Glucose, Bld: 102 mg/dL — ABNORMAL HIGH (ref 70–99)
Potassium: 3.6 mEq/L (ref 3.5–5.1)
Sodium: 132 mEq/L — ABNORMAL LOW (ref 135–145)
Total Bilirubin: 1 mg/dL (ref 0.2–1.2)
Total Protein: 7.5 g/dL (ref 6.0–8.3)

## 2021-03-29 LAB — CBC WITH DIFFERENTIAL/PLATELET
Basophils Absolute: 0.1 10*3/uL (ref 0.0–0.1)
Basophils Relative: 2.2 % (ref 0.0–3.0)
Eosinophils Absolute: 0.1 10*3/uL (ref 0.0–0.7)
Eosinophils Relative: 2.8 % (ref 0.0–5.0)
HCT: 36.5 % (ref 36.0–46.0)
Hemoglobin: 13 g/dL (ref 12.0–15.0)
Lymphocytes Relative: 27.7 % (ref 12.0–46.0)
Lymphs Abs: 1.3 10*3/uL (ref 0.7–4.0)
MCHC: 35.6 g/dL (ref 30.0–36.0)
MCV: 89.4 fl (ref 78.0–100.0)
Monocytes Absolute: 0.5 10*3/uL (ref 0.1–1.0)
Monocytes Relative: 11.7 % (ref 3.0–12.0)
Neutro Abs: 2.5 10*3/uL (ref 1.4–7.7)
Neutrophils Relative %: 55.6 % (ref 43.0–77.0)
Platelets: 294 10*3/uL (ref 150.0–400.0)
RBC: 4.08 Mil/uL (ref 3.87–5.11)
RDW: 13.3 % (ref 11.5–15.5)
WBC: 4.6 10*3/uL (ref 4.0–10.5)

## 2021-03-29 LAB — HEMOGLOBIN A1C: Hgb A1c MFr Bld: 6.3 % (ref 4.6–6.5)

## 2021-03-29 LAB — TSH: TSH: 0.71 u[IU]/mL (ref 0.35–5.50)

## 2021-03-29 NOTE — Assessment & Plan Note (Signed)
Chronic ?Blood pressure well controlled ?CMP ?Continue lisinopril 40 mg daily, metoprolol XL 50 mg daily ?

## 2021-03-29 NOTE — Assessment & Plan Note (Addendum)
Chronic ?Regular exercise and healthy diet encouraged ?Check lipid panel  ?Continue atorvastatin 80 mg daily, Nexlizet 180-10 mg daily, Leqvio inj ?

## 2021-03-29 NOTE — Assessment & Plan Note (Signed)
Chronic ?In mouth and on legs ?Prevents her from using CPAP regularly because the CPAP dries out her mouth and causes her gums to bleed, also not enjoying several foods so overall impacting quality of life ?Trying to get a biopsy to confirm this is lichen planus and not something else and then will see a specialist to see about treatment ?Currently using Magic mouthwash, which helps temporarily by numbing, but not preventing it or improving it ?

## 2021-03-29 NOTE — Assessment & Plan Note (Signed)
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

## 2021-03-29 NOTE — Assessment & Plan Note (Signed)
Chronic ?Following with cardiology ?No symptoms consistent with angina ?On atorvastatin 80 mg daily ?Blood pressure well controlled ?Stressed healthy diet, regular exercise, weight loss ?

## 2021-03-29 NOTE — Assessment & Plan Note (Signed)
Chronic Check a1c Low sugar / carb diet Stressed regular exercise  

## 2021-03-29 NOTE — Assessment & Plan Note (Signed)
Chronic ?DEXA due ?Stressed regular exercise ?Advised calcium and vitamin D ?

## 2021-03-29 NOTE — Assessment & Plan Note (Signed)
Acute ?Started sometime in the past few months ?Has not been sleeping well consistently, but even when she does sleep well she is exhausted ?Has not been able to use the CPAP consistently because of her mouth and that may be contributing to fatigue ?We will check CBC, CMP, TSH ?

## 2021-04-18 DIAGNOSIS — H01001 Unspecified blepharitis right upper eyelid: Secondary | ICD-10-CM | POA: Diagnosis not present

## 2021-04-18 DIAGNOSIS — H52203 Unspecified astigmatism, bilateral: Secondary | ICD-10-CM | POA: Diagnosis not present

## 2021-04-18 DIAGNOSIS — H2513 Age-related nuclear cataract, bilateral: Secondary | ICD-10-CM | POA: Diagnosis not present

## 2021-04-18 DIAGNOSIS — H401131 Primary open-angle glaucoma, bilateral, mild stage: Secondary | ICD-10-CM | POA: Diagnosis not present

## 2021-05-02 ENCOUNTER — Telehealth: Payer: Self-pay | Admitting: Internal Medicine

## 2021-05-02 NOTE — Telephone Encounter (Signed)
Order was faxed several times to the [phone] fax number. ? ?Faxed order to new number in message and called to confirm order had been received. ? ?Order received.  ?

## 2021-05-02 NOTE — Telephone Encounter (Signed)
Chelsea Moreno called requesting a referral for a mammogram to check for calcification in PT's L breast for an appointment tomorrow. Can not locate orders, please fax referral to [facility] or advise on an alternative.  ? ?Phone: [phone] ?Fax: [phone] ?

## 2021-05-03 DIAGNOSIS — R928 Other abnormal and inconclusive findings on diagnostic imaging of breast: Secondary | ICD-10-CM | POA: Diagnosis not present

## 2021-05-03 DIAGNOSIS — R921 Mammographic calcification found on diagnostic imaging of breast: Secondary | ICD-10-CM | POA: Diagnosis not present

## 2021-05-03 LAB — HM MAMMOGRAPHY

## 2021-05-04 ENCOUNTER — Encounter: Payer: Self-pay | Admitting: Internal Medicine

## 2021-05-04 NOTE — Progress Notes (Signed)
Outside notes received. Information abstracted. Notes sent to scan.  

## 2021-05-17 DIAGNOSIS — H401122 Primary open-angle glaucoma, left eye, moderate stage: Secondary | ICD-10-CM | POA: Diagnosis not present

## 2021-05-26 ENCOUNTER — Encounter (HOSPITAL_COMMUNITY)
Admission: RE | Admit: 2021-05-26 | Discharge: 2021-05-26 | Disposition: A | Discharge status: Home / Self Care | Payer: Medicare Other | Source: Ambulatory Visit | Attending: Cardiology | Admitting: Cardiology

## 2021-05-26 DIAGNOSIS — E785 Hyperlipidemia, unspecified: Secondary | ICD-10-CM | POA: Diagnosis not present

## 2021-05-26 DIAGNOSIS — R931 Abnormal findings on diagnostic imaging of heart and coronary circulation: Secondary | ICD-10-CM | POA: Insufficient documentation

## 2021-05-26 MED ORDER — INCLISIRAN SODIUM 284 MG/1.5ML ~~LOC~~ SOSY
PREFILLED_SYRINGE | SUBCUTANEOUS | Status: AC
  Filled 2021-05-26: qty 1.5

## 2021-05-26 MED ORDER — INCLISIRAN SODIUM 284 MG/1.5ML ~~LOC~~ SOSY
284.0000 mg | PREFILLED_SYRINGE | Freq: Once | SUBCUTANEOUS | Status: AC
  Administered 2021-05-26: 284 mg via SUBCUTANEOUS

## 2021-05-30 ENCOUNTER — Other Ambulatory Visit: Payer: Self-pay

## 2021-05-30 DIAGNOSIS — E785 Hyperlipidemia, unspecified: Secondary | ICD-10-CM

## 2021-05-31 DIAGNOSIS — H401112 Primary open-angle glaucoma, right eye, moderate stage: Secondary | ICD-10-CM | POA: Diagnosis not present

## 2021-06-01 ENCOUNTER — Telehealth: Payer: Self-pay

## 2021-06-01 DIAGNOSIS — K1370 Unspecified lesions of oral mucosa: Secondary | ICD-10-CM | POA: Diagnosis not present

## 2021-06-01 DIAGNOSIS — E785 Hyperlipidemia, unspecified: Secondary | ICD-10-CM

## 2021-06-01 NOTE — Telephone Encounter (Signed)
Called and lmom pt that Labs fasting needed for leqvio efficacy. I will route to myself to continue calling two more times

## 2021-06-01 NOTE — Telephone Encounter (Signed)
-----   Message from Rockne Menghini, [place] sent at 05/31/2021  5:13 PM EDT ----- Regarding: RE: labs post 2nd leqvio dose Please order lipid/liver labs to be drawn about a month after second dose  Thank you  K ----- Message ----- From: Allean Found, CMA Sent: 05/30/2021   8:18 AM EDT To: Rockne Menghini, RPH-CPP Subject: labs post 2nd leqvio dose                      Permission to order lipid/lft labs post 2nd leqvio dose?

## 2021-06-06 NOTE — Telephone Encounter (Signed)
Called and spoe w/pt who stated that they plan to do labs fasting tomorrow.

## 2021-06-07 DIAGNOSIS — E785 Hyperlipidemia, unspecified: Secondary | ICD-10-CM | POA: Diagnosis not present

## 2021-06-07 LAB — LIPID PANEL
Chol/HDL Ratio: 2.3 ratio (ref 0.0–4.4)
Cholesterol, Total: 85 mg/dL — ABNORMAL LOW (ref 100–199)
HDL: 37 mg/dL — ABNORMAL LOW (ref >39)
LDL Chol Calc (NIH): 28 mg/dL (ref 0–99)
Triglycerides: 106 mg/dL (ref 0–149)
VLDL Cholesterol Cal: 20 mg/dL (ref 5–40)

## 2021-06-07 LAB — HEPATIC FUNCTION PANEL
ALT: 26 IU/L (ref 0–32)
AST: 32 IU/L (ref 0–40)
Albumin: 4.4 g/dL (ref 3.8–4.8)
Alkaline Phosphatase: 84 IU/L (ref 44–121)
Bilirubin Total: 0.5 mg/dL (ref 0.0–1.2)
Bilirubin, Direct: 0.22 mg/dL (ref 0.00–0.40)
Total Protein: 6.7 g/dL (ref 6.0–8.5)

## 2021-06-12 ENCOUNTER — Telehealth: Payer: Self-pay | Admitting: Pharmacist Clinician (PhC)/ Clinical Pharmacy Specialist

## 2021-06-12 NOTE — Telephone Encounter (Signed)
Lipid panel resulted.  LDL now at goal on Leqvio. Can discontinue Nexlizet at this time but will continue atorvastatin '80mg'$  once daily. Next injection in 6 months

## 2021-06-12 NOTE — Telephone Encounter (Signed)
Patient returned call to say that she did having her lab work done last week, she said the results are in.

## 2021-07-04 DIAGNOSIS — H401131 Primary open-angle glaucoma, bilateral, mild stage: Secondary | ICD-10-CM | POA: Diagnosis not present

## 2021-08-30 ENCOUNTER — Encounter: Payer: Self-pay | Admitting: Internal Medicine

## 2021-08-30 ENCOUNTER — Ambulatory Visit (INDEPENDENT_AMBULATORY_CARE_PROVIDER_SITE_OTHER): Payer: Medicare Other | Admitting: Internal Medicine

## 2021-08-30 VITALS — BP 120/74 | HR 58 | Ht 62.0 in | Wt 191.0 lb

## 2021-08-30 DIAGNOSIS — R5383 Other fatigue: Secondary | ICD-10-CM | POA: Diagnosis not present

## 2021-08-30 DIAGNOSIS — R7989 Other specified abnormal findings of blood chemistry: Secondary | ICD-10-CM | POA: Diagnosis not present

## 2021-08-30 DIAGNOSIS — E232 Diabetes insipidus: Secondary | ICD-10-CM | POA: Diagnosis not present

## 2021-08-30 LAB — FOLLICLE STIMULATING HORMONE: FSH: 22.5 m[IU]/mL

## 2021-08-30 LAB — CORTISOL: Cortisol, Plasma: 7.4 ug/dL

## 2021-08-30 LAB — BASIC METABOLIC PANEL
BUN: 12 mg/dL (ref 6–23)
CO2: 27 mEq/L (ref 19–32)
Calcium: 9.2 mg/dL (ref 8.4–10.5)
Chloride: 99 mEq/L (ref 96–112)
Creatinine, Ser: 0.61 mg/dL (ref 0.40–1.20)
GFR: 90.36 mL/min (ref >60.00)
Glucose, Bld: 126 mg/dL — ABNORMAL HIGH (ref 70–99)
Potassium: 4.1 mEq/L (ref 3.5–5.1)
Sodium: 132 mEq/L — ABNORMAL LOW (ref 135–145)

## 2021-08-30 LAB — TSH: TSH: 0.94 u[IU]/mL (ref 0.35–5.50)

## 2021-08-30 LAB — T4, FREE: Free T4: 0.76 ng/dL (ref 0.60–1.60)

## 2021-08-30 NOTE — Progress Notes (Signed)
Name: Chelsea Moreno  MRN/ DOB: [id number], February 27, 1950    Age/ Sex: 71 y.o., female    PCP: Binnie Rail, MD   Reason for Endocrinology Evaluation: Diabetes Insipidus (DI)     Date of Initial Endocrinology Evaluation: 04/25/2018    HPI: Chelsea Moreno is a 71 y.o. female with a past medical history of DI, HTN and Dyslipidemia. The patient presented for initial endocrinology clinic visit on 04/25/2018 for consultative assistance with her DI .     HISTORICAL SUMMARY: The patient was first diagnosed with DI  at age 48. She presented with polyuria and polydipsia to where she was not able to perform her daily activities or school activities, she was living in [place], [place] at the time, was evaluated at [facility] for ~ 2 weeks and was initially on injections, followed by a powder medicine but around 2010 she was started on Desmopressin 1 spray Q12 hrs.   The cause of DI is unclear but she believes this was attributed to her recurrent falls as a child.   She has been on Desmopressin 1 spray Q12 hrs until March, 2020 when she was noted with hyponatremia ,Na 128 mEq/L, and was asked to reduce the dose to once a day but that patient felt miserable with polydipsia, drinking ~ 2 gallons of fluid at a time and polyuria with the urge to urinate every 45 minutes. Her repeat sodium was 144 mEq/L but the patient is now using desmopressin ~ every 18 hrs, the last time she used it was ~ 8:30 pm and the time prior to that was around 12:45 AM.   Prior to all this , when she was on Q12 desmopressin dosing she tends to drink water out of habit.     She has no FH of DI  SUBJECTIVE:    Today (08/30/2021):  Chelsea Moreno is here for a follow up on diabetes insipidus.   She has been noted with weight loss  Denies nausea, or vomiting  Has noted fatigue for the past few months  She doesn't always sleep soundly but got 6 hrs last night  Has glaucoma , working with an opthalmologic . S/P laser eye sx x  2 Denies headaches  Denies palpitations  Had noted congestion  Has been drinking 96  oz a a day   dDAVP 0.01 % 1 spray daily and a second dose if symptomatic with polyuria and polydipsia  - she tends to  use it every 18 hours     HISTORY:  Past Medical History:  Past Medical History:  Diagnosis Date   Breast cancer ([place]) 11/05/12   left   Bursitis    COLONIC POLYPS, HX OF 09/24/2008   DEPRESSION 09/23/2007   takes Lexapro daily but hasn't taken since Sept 2014   Diabetes insipidus ([place])    takes Desmopressin bid   Diarrhea    Dizziness    when nauseated gets dizzy   Family history of ovarian cancer    Hemorrhoids    History of bronchitis    yrs ago   History of migraine    last time many yrs ago   Hx of radiation therapy 01/29/13- 03/16/13   left breast 4500 cGy 25 sessions, left breast boost 1000 cGy 5 sessions   HYPERLIPIDEMIA 09/06/2006   takes Atorvasatin nightly   HYPERTENSION 09/06/2006   takes Lisinopril daily   Muscle spasms of head and/or neck    OSTEOPOROSIS 09/24/2008   takes Actonel  every 30days   Vitamin D deficiency    takes OTC Vit D   Past Surgical History:  Past Surgical History:  Procedure Laterality Date   BREAST BIOPSY Left 49yr ago   BREAST LUMPECTOMY WITH NEEDLE LOCALIZATION Left 12/16/2012   Procedure: PARTIAL MASTECTOMY WITH NEEDLE LOCALIZATION;  Surgeon: FStark Klein MD;  [facility]  Service: General;  [place]: [facility];  [facility]  reports that she quit smoking about 48 years ago. Her smoking use included cigarettes. She has a 2.00 pack-year smoking history. She has never used smokeless tobacco. She reports current alcohol use. She reports that she does not use drugs. Family History: family history includes ALS in her brother; Alcohol abuse in her maternal grandfather; Cancer in her maternal aunt and mother; Heart attack in her father, maternal grandmother, and mother; Heart attack (age of onset:  423 in her brother; Heart attack (age of onset: 567 in her sister; Heart disease in her brother, father, mother, and sister; Hyperlipidemia in her father; Hypertension in her brother, father, and sister; Ovarian cancer in her mother; Parkinson's disease in her sister; Varicose Veins in her mother.   HOME MEDICATIONS: Allergies as of 08/30/2021   No Known Allergies      Medication List        Accurate as of August 30, 2021  1:09 PM. If you have any questions, ask your nurse or doctor.          STOP taking these medications    magic mouthwash w/lidocaine Soln Stopped by: IDorita Sciara MD       TAKE these medications    atorvastatin 80 MG tablet Commonly known as: LIPITOR TAKE 1 TABLET DAILY   desmopressin 0.01 % solution Commonly known as: DDAVP NASAL USE 1 SPRAY (10 MCG) IN EACH NOSTRIL TWICE A DAY   inclisiran 284 MG/1.5ML Sosy injection Commonly known as: LEQVIO Inject 284 mg into the skin every 6 (six) months.   lisinopril 40 MG tablet Commonly known as: ZESTRIL Take 1 tablet (40 mg total) by mouth daily.   metoprolol succinate 50 MG 24 hr tablet Commonly known as: TOPROL-XL TAKE 1 TABLET DAILY. TAKE WITH OR IMMEDIATELY FOLLOWING A MEAL.   ondansetron 4 MG disintegrating tablet Commonly known as: Zofran ODT Take 1 tablet (4 mg total) by mouth every 8 (eight) hours as needed for nausea or vomiting.   Simbrinza 1-0.2 % Susp Generic drug: Brinzolamide-Brimonidine Apply to eye.   Vyzulta 0.024 % Soln Generic drug: Latanoprostene Bunod INSTILL 1 DROP INTO EACH EYE ONCE DAILY AT BEDTIME            DATA REVIEWED:  Latest Reference Range & Units 08/30/21 13:55  Sodium 135 - 145 mEq/L 132 (L)  Potassium 3.5 - 5.1 mEq/L 4.1  Chloride 96 - 112 mEq/L 99  CO2 19 - 32 mEq/L 27  Glucose 70 - 99 mg/dL 126 (H)  BUN 6 - 23 mg/dL 12  Creatinine 0.40 - 1.20 mg/dL 0.61  Calcium 8.4 - 10.5 mg/dL 9.2  GFR >60.00 mL/min 90.36    Latest Reference Range  & Units 08/30/21 13:55  Cortisol, Plasma ug/dL 7.4  FSH mIU/ML 22.5  Prolactin ng/mL 2.7  Glucose 70 - 99 mg/dL 126 (H)  TSH 0.35 - 5.50 uIU/mL 0.94  T4,Free(Direct) 0.60 - 1.60 ng/dL 0.76    ASSESSMENT/PLAN/RECOMMENDATIONS:   Diabetes Insipidus:   - Pt  advised again to drink to  thirst ONLY , to avoid hyponatremia, last year she average fluid intake of 60 oz and today 96 oz - We had discussed the consequences of hyponatremia in the past and causing seizures and coma etc, we discussed promptly getting a medical evaluation for nausea/ vomiting  -Sodium has been low this year, she has been averaging desmopressin intake Q18 hrs - She was given a choice of spacing desmopressin to Q22 hrs, reducing fluid intake to 65 oz vs switching to oral desmopressin . She likes the nasal route due to immediate effect -No changes today  Medications : dDAVP 0.01 % 1 spray Q 22 hours.     2. Low Prolactin/FSH:   - Will proceed with pituitary imaging due to low prolactin and inappropriately normal FSH , to r/o pituitary adenoma with mass effect       F/u in 1 yr  Signed electronically by: Mack Guise, MD  [facility] Endocrin[facility]  [address] [place], [place] [address] Phone: [phone] FAX: [phone]   CC: Binnie Rail, MD [place] [address] Phone: [phone] Fax: [phone]   Return to Endocrinology clinic as below: Future Appointments  Date Time Provider [place]  10/04/2021 11:00 AM Binnie Rail, MD [facility] None  10/12/2021  4:20 PM Donato Heinz, MD [facility]  11/27/2021 10:00 AM [facility] None  12/04/2021 10:45 AM Causey, Charlestine Massed, NP [facility] None

## 2021-08-31 ENCOUNTER — Telehealth: Payer: Self-pay | Admitting: Internal Medicine

## 2021-08-31 NOTE — Telephone Encounter (Signed)
Left a message for the patient to call back on 08/27/2021 at 10:45 AM     Tillar, MD  [facility]  [facility] [address] [place], [place] [address] Phone: [phone] FAX: [phone]

## 2021-08-31 NOTE — Telephone Encounter (Signed)
Patient is returning call about results and has information she would like to relay to Dr. Kelton Pillar.  Patient states that she would like to move forward with the scheduling of the MRI.

## 2021-09-06 LAB — PROLACTIN: Prolactin: 2.7 ng/mL

## 2021-09-06 LAB — ACTH: C206 ACTH: 9 pg/mL (ref 6–50)

## 2021-09-15 ENCOUNTER — Ambulatory Visit
Admission: RE | Admit: 2021-09-15 | Discharge: 2021-09-15 | Disposition: A | Discharge status: Home / Self Care | Payer: Medicare Other | Source: Ambulatory Visit | Attending: Internal Medicine | Admitting: Internal Medicine

## 2021-09-15 DIAGNOSIS — I6782 Cerebral ischemia: Secondary | ICD-10-CM | POA: Diagnosis not present

## 2021-09-15 DIAGNOSIS — E232 Diabetes insipidus: Secondary | ICD-10-CM

## 2021-09-15 MED ORDER — GADOBENATE DIMEGLUMINE 529 MG/ML IV SOLN
9.0000 mL | Freq: Once | INTRAVENOUS | Status: AC | PRN
  Administered 2021-09-15: 9 mL via INTRAVENOUS

## 2021-09-25 ENCOUNTER — Other Ambulatory Visit: Payer: Self-pay | Admitting: Medical

## 2021-09-26 DIAGNOSIS — H401132 Primary open-angle glaucoma, bilateral, moderate stage: Secondary | ICD-10-CM | POA: Diagnosis not present

## 2021-09-26 DIAGNOSIS — H43813 Vitreous degeneration, bilateral: Secondary | ICD-10-CM | POA: Diagnosis not present

## 2021-09-26 DIAGNOSIS — H02105 Unspecified ectropion of left lower eyelid: Secondary | ICD-10-CM | POA: Diagnosis not present

## 2021-10-03 ENCOUNTER — Encounter: Payer: Self-pay | Admitting: Internal Medicine

## 2021-10-03 NOTE — Patient Instructions (Addendum)
     Your a1c is 5.9%     Medications changes include :   none     Return in about 6 months (around 04/04/2022) for follow up.

## 2021-10-03 NOTE — Progress Notes (Unsigned)
      Subjective:    Patient ID: Chelsea Moreno, female    DOB: 1950-12-24, 71 y.o.   MRN: [id number]     HPI Chelsea Moreno is here for follow up of her chronic medical problems, including htn, CAD, prediabetes, hld, OSA, oral lichen planus   Can just check a1c  Medications and allergies reviewed with patient and updated if appropriate.  Current Outpatient Medications on File Prior to Visit  Medication Sig Dispense Refill   atorvastatin (LIPITOR) 80 MG tablet TAKE 1 TABLET DAILY 90 tablet 3   Brinzolamide-Brimonidine (SIMBRINZA) 1-0.2 % SUSP Apply to eye.     desmopressin (DDAVP NASAL) 0.01 % solution USE 1 SPRAY (10 MCG) IN EACH NOSTRIL TWICE A DAY 10 mL 13   inclisiran (LEQVIO) 284 MG/1.5ML SOSY injection Inject 284 mg into the skin every 6 (six) months.     lisinopril (ZESTRIL) 40 MG tablet TAKE 1 TABLET DAILY (DOSE CHANGED) 30 tablet 0   metoprolol succinate (TOPROL-XL) 50 MG 24 hr tablet TAKE 1 TABLET DAILY. TAKE WITH OR IMMEDIATELY FOLLOWING A MEAL. 90 tablet 3   ondansetron (ZOFRAN ODT) 4 MG disintegrating tablet Take 1 tablet (4 mg total) by mouth every 8 (eight) hours as needed for nausea or vomiting. 30 tablet 0   VYZULTA 0.024 % SOLN INSTILL 1 DROP INTO EACH EYE ONCE DAILY AT BEDTIME     No current facility-administered medications on file prior to visit.     Review of Systems     Objective:  There were no vitals filed for this visit. BP Readings from Last 3 Encounters:  08/30/21 120/74  05/26/21 123/67  03/29/21 140/72   Wt Readings from Last 3 Encounters:  08/30/21 191 lb (86.6 kg)  03/29/21 198 lb 3.2 oz (89.9 kg)  11/29/20 201 lb 9.6 oz (91.4 kg)   There is no height or weight on file to calculate BMI.    Physical Exam     Lab Results  Component Value Date   WBC 4.6 03/29/2021   HGB 13.0 03/29/2021   HCT 36.5 03/29/2021   PLT 294.0 03/29/2021   GLUCOSE 126 (H) 08/30/2021   CHOL 85 (L) 06/07/2021   TRIG 106 06/07/2021   HDL 37 (L) 06/07/2021    LDLDIRECT 171.8 03/21/2011   LDLCALC 28 06/07/2021   ALT 26 06/07/2021   AST 32 06/07/2021   NA 132 (L) 08/30/2021   K 4.1 08/30/2021   CL 99 08/30/2021   CREATININE 0.61 08/30/2021   BUN 12 08/30/2021   CO2 27 08/30/2021   TSH 0.94 08/30/2021   HGBA1C 6.3 03/29/2021     Assessment & Plan:    See Problem List for Assessment and Plan of chronic medical problems.

## 2021-10-04 ENCOUNTER — Ambulatory Visit (INDEPENDENT_AMBULATORY_CARE_PROVIDER_SITE_OTHER): Payer: Medicare Other | Admitting: Internal Medicine

## 2021-10-04 VITALS — BP 128/78 | HR 60 | Temp 98.0°F | Ht 62.0 in | Wt 194.0 lb

## 2021-10-04 DIAGNOSIS — E785 Hyperlipidemia, unspecified: Secondary | ICD-10-CM | POA: Diagnosis not present

## 2021-10-04 DIAGNOSIS — L439 Lichen planus, unspecified: Secondary | ICD-10-CM | POA: Diagnosis not present

## 2021-10-04 DIAGNOSIS — I10 Essential (primary) hypertension: Secondary | ICD-10-CM

## 2021-10-04 DIAGNOSIS — R7303 Prediabetes: Secondary | ICD-10-CM | POA: Diagnosis not present

## 2021-10-04 DIAGNOSIS — R432 Parageusia: Secondary | ICD-10-CM | POA: Diagnosis not present

## 2021-10-04 DIAGNOSIS — R931 Abnormal findings on diagnostic imaging of heart and coronary circulation: Secondary | ICD-10-CM | POA: Diagnosis not present

## 2021-10-04 DIAGNOSIS — L438 Other lichen planus: Secondary | ICD-10-CM | POA: Insufficient documentation

## 2021-10-04 LAB — POCT GLYCOSYLATED HEMOGLOBIN (HGB A1C)
HbA1c POC (<> result, manual entry): 5.9 % (ref 4.0–5.6)
HbA1c, POC (controlled diabetic range): 5.9 % (ref 0.0–7.0)
HbA1c, POC (prediabetic range): 5.9 % (ref 5.7–6.4)
Hemoglobin A1C: 5.9 % — AB (ref 4.0–5.6)

## 2021-10-04 MED ORDER — ASPIRIN 81 MG PO TBEC: 81.0000 mg | DELAYED_RELEASE_TABLET | Freq: Every day | ORAL | 12 refills | Status: AC

## 2021-10-04 NOTE — Assessment & Plan Note (Signed)
Chronic Blood pressure well controlled Continue lisinopril 40 mg daily, metoprolol XL 50 mg daily Recent BMP reviewed

## 2021-10-04 NOTE — Assessment & Plan Note (Addendum)
Chronic A1c today-  5.9% Stressed diabetic diet, regular exercise, weight loss

## 2021-10-04 NOTE — Assessment & Plan Note (Signed)
Chronic Following with oral surgeon Did have numerous ulcers, but they have improved allowing her to eat better

## 2021-10-04 NOTE — Assessment & Plan Note (Addendum)
Chronic High coronary calcium score 1202 Continue atorvastatin 80 mg daily, metoprolol XL 50 mg daily Injectable cholesterol medication- Leqvio Advised to start aspirin 81 mg daily

## 2021-10-04 NOTE — Assessment & Plan Note (Signed)
Improved Secondary to oral lichen planus

## 2021-10-04 NOTE — Assessment & Plan Note (Addendum)
Chronic Lipids have been well controlled Continue atorvastatin 80 mg daily, also on Leqvio

## 2021-10-11 ENCOUNTER — Other Ambulatory Visit: Payer: Self-pay | Admitting: Cardiology

## 2021-10-12 ENCOUNTER — Ambulatory Visit: Payer: Medicare Other | Admitting: Cardiology

## 2021-10-16 ENCOUNTER — Other Ambulatory Visit: Payer: Self-pay | Admitting: Internal Medicine

## 2021-11-03 DIAGNOSIS — Z1231 Encounter for screening mammogram for malignant neoplasm of breast: Secondary | ICD-10-CM | POA: Diagnosis not present

## 2021-11-03 LAB — HM MAMMOGRAPHY

## 2021-11-23 ENCOUNTER — Encounter: Payer: Self-pay | Admitting: Internal Medicine

## 2021-11-23 NOTE — Progress Notes (Signed)
Outside notes received. Information abstracted. Notes sent to scan.  

## 2021-11-27 ENCOUNTER — Encounter (HOSPITAL_COMMUNITY): Payer: Medicare Other

## 2021-12-04 ENCOUNTER — Inpatient Hospital Stay: Payer: Medicare Other | Attending: Adult Health | Admitting: Adult Health

## 2021-12-04 ENCOUNTER — Inpatient Hospital Stay: Payer: Medicare Other

## 2021-12-04 ENCOUNTER — Ambulatory Visit (HOSPITAL_COMMUNITY)
Admission: RE | Admit: 2021-12-04 | Discharge: 2021-12-04 | Disposition: A | Discharge status: Home / Self Care | Payer: Medicare Other | Source: Ambulatory Visit | Attending: Cardiology | Admitting: Cardiology

## 2021-12-04 ENCOUNTER — Encounter: Payer: Self-pay | Admitting: Adult Health

## 2021-12-04 VITALS — BP 142/56 | HR 59 | Temp 97.4°F | Resp 18 | Ht 62.0 in | Wt 195.6 lb

## 2021-12-04 DIAGNOSIS — Z87891 Personal history of nicotine dependence: Secondary | ICD-10-CM | POA: Insufficient documentation

## 2021-12-04 DIAGNOSIS — M8589 Other specified disorders of bone density and structure, multiple sites: Secondary | ICD-10-CM

## 2021-12-04 DIAGNOSIS — C50312 Malignant neoplasm of lower-inner quadrant of left female breast: Secondary | ICD-10-CM

## 2021-12-04 DIAGNOSIS — Z23 Encounter for immunization: Secondary | ICD-10-CM | POA: Diagnosis not present

## 2021-12-04 DIAGNOSIS — Z853 Personal history of malignant neoplasm of breast: Secondary | ICD-10-CM | POA: Diagnosis not present

## 2021-12-04 DIAGNOSIS — E785 Hyperlipidemia, unspecified: Secondary | ICD-10-CM | POA: Insufficient documentation

## 2021-12-04 DIAGNOSIS — E2839 Other primary ovarian failure: Secondary | ICD-10-CM | POA: Diagnosis not present

## 2021-12-04 DIAGNOSIS — Z923 Personal history of irradiation: Secondary | ICD-10-CM | POA: Insufficient documentation

## 2021-12-04 DIAGNOSIS — Z170 Estrogen receptor positive status [ER+]: Secondary | ICD-10-CM | POA: Diagnosis not present

## 2021-12-04 MED ORDER — INCLISIRAN SODIUM 284 MG/1.5ML ~~LOC~~ SOSY
PREFILLED_SYRINGE | SUBCUTANEOUS | Status: AC
  Filled 2021-12-04: qty 1.5

## 2021-12-04 MED ORDER — INCLISIRAN SODIUM 284 MG/1.5ML ~~LOC~~ SOSY
284.0000 mg | PREFILLED_SYRINGE | Freq: Once | SUBCUTANEOUS | Status: AC
  Administered 2021-12-04: 284 mg via SUBCUTANEOUS

## 2021-12-04 MED ORDER — INFLUENZA VAC A&B SA ADJ QUAD 0.5 ML IM PRSY
0.5000 mL | PREFILLED_SYRINGE | Freq: Once | INTRAMUSCULAR | Status: AC
  Administered 2021-12-04: 0.5 mL via INTRAMUSCULAR
  Filled 2021-12-04: qty 0.5

## 2021-12-04 NOTE — Assessment & Plan Note (Signed)
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  Violett continues on observation alone and has no clinical or radiographic signs of breast cancer recurrence.  We discussed modifiable risk factors for breast cancer risk reduction 1 of which is healthy diet/exercise/weight loss.  She is up-to-date with her mammograms and is due for repeat in October 2024.  She is also due for repeat bone density testing at [facility] since this was last done in 2021.  I placed orders for bone density testing and asked my nurse to fax them to [place].  Gweneth will return next year for continued follow-up and surveillance.

## 2021-12-04 NOTE — Progress Notes (Signed)
[facility] Follow up:    Binnie Rail, MD [place] [address]   DIAGNOSIS:  Cancer Staging  Cancer of lower-inner quadrant of left female breast [facility]) Staging form: Breast, AJCC 7th Edition - Clinical: Stage 0 (Tis, N0, cM0) - Unsigned Specimen type: Core Needle Biopsy Histopathologic type: 9932 Laterality: Left Staging comments: Staged at breast conference 11.5.14  - Pathologic: No stage assigned - Unsigned Specimen type: Core Needle Biopsy Histopathologic type: 9932 Laterality: Left   SUMMARY OF ONCOLOGIC HISTORY: Oncology History  Cancer of lower-inner quadrant of left female breast ([place])  11/05/2012 Initial Diagnosis   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  The Hereditary Gene Panel offered by [facility] includes sequencing and/or deletion duplication testing of the following 47 genes: APC, ATM, AXIN2, BARD1, BMPR1A, BRCA1, BRCA2, BRIP1, CDH1, CDK4, CDKN2A (p14ARF), CDKN2A (p16INK4a), CHEK2, CTNNA1, DICER1, EPCAM (Deletion/duplication testing only), GREM1 (promoter region deletion/duplication testing only), KIT, MEN1, MLH1, MSH2, MSH3, MSH6, MUTYH, NBN, NF1, NHTL1, PALB2, PDGFRA, PMS2, POLD1, POLE, PTEN, RAD50, RAD51C, RAD51D, SDHB, SDHC, SDHD, SMAD4, SMARCA4. STK11, TP53, TSC1, TSC2, and VHL.  The following genes were evaluated for sequence changes only: SDHA and HOXB13 c.251G>A variant only. The report date is February 19, 2017.     CURRENT THERAPY: Observation  INTERVAL HISTORY: KEASHA MALKIEWICZ 71 y.o. female returns for follow-up of her history of breast cancer.  Nalany's most  recent mammogram occurred on November 03, 2021 and showed no mammographic evidence of malignancy and breast density category A.  Naphtali also underwent bone density testing at [facility] that showed osteopenia with a T-score of -1.6 in the AP spine.  Chantrice has been doing well this year she is staying up-to-date with her primary care provider visits and health maintenance.  She notes that she needs to start exercising but struggles with the motivation to do so.   Patient Active Problem List   Diagnosis Date Noted   Fatigue 03/29/2021   Altered taste 08/08/2020   High coronary artery calcium score, 1202 03/28/2020   Dizziness 03/25/2019   Prediabetes 03/24/2018   Morbid obesity ([place]) 03/24/2018   Throat tightness 03/24/2018   Genetic testing 02/26/2017   Family history of ovarian cancer    OSA (obstructive sleep apnea) 09/08/2014   Varicose veins of leg with swelling 08/13/2014   Vomiting and diarrhea, episodic 07/15/2014   Chronic venous insufficiency 06/02/2014   Cancer of lower-inner quadrant of left female breast ([place]) 89/37/3428   Lichen planus 76/81/1572   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  MEDICAL HISTORY: Past Medical History:  Diagnosis Date   Breast cancer ([place]) 11/05/12   left   Bursitis    COLONIC POLYPS, HX OF 09/24/2008   DEPRESSION 09/23/2007   takes Lexapro daily but hasn't taken since Sept 2014   Diabetes insipidus ([place])    takes Desmopressin bid   Diarrhea    Dizziness    when nauseated gets dizzy   Family history of ovarian cancer  Hemorrhoids    History of bronchitis    yrs ago   History of migraine    last time many yrs ago   Hx of radiation therapy 01/29/13- 03/16/13   left breast 4500 cGy 25 sessions, left breast boost 1000 cGy 5 sessions   HYPERLIPIDEMIA 09/06/2006   takes Atorvasatin nightly   HYPERTENSION  09/06/2006   takes Lisinopril daily   Muscle spasms of head and/or neck    OSTEOPOROSIS 09/24/2008   takes Actonel every 30days   Vitamin D deficiency    takes OTC Vit D    SURGICAL HISTORY: Past Surgical History:  Procedure Laterality Date   BREAST BIOPSY Left 79yr ago   BREAST LUMPECTOMY WITH NEEDLE LOCALIZATION Left 12/16/2012   Procedure: PARTIAL MASTECTOMY WITH NEEDLE LOCALIZATION;  Surgeon: FStark Klein MD;  Location: [facility]  Service: General;  Laterality: Left;  1:00 NL at [facility]    COLONOSCOPY      SOCIAL HISTORY: Social History   Socioeconomic History   Marital status: Widowed    Spouse name: Not on file   Number of children: Not on file   Years of education: Not on file   Highest education level: Not on file  Occupational History   Occupation: Professor at [employer]   Smoking status: Former    Packs/day: 0.50    Years: 4.00    Total pack years: 2.00    Types: Cigarettes    Quit date: 01/07/1973    Years since quitting: 48.9   Smokeless tobacco: Never   Tobacco comments:    quit 432yrago  Substance and Sexual Activity   Alcohol use: Yes    Alcohol/week: 0.0 standard drinks of alcohol    Comment: socailly   Drug use: No   Sexual activity: Not Currently    Birth control/protection: Post-menopausal    Comment: menarche age [age]first live birth age [age]menopause 2002, no HRT  Other Topics Concern   Not on file  Social History Narrative   Not on file   Social Determinants of Health   Financial Resource Strain: Low Risk  (03/27/2021)   Overall Financial Resource Strain (CARDIA)    Difficulty of Paying Living Expenses: Not hard at all  Food Insecurity: No Food Insecurity (03/27/2021)   Hunger Vital Sign    Worried About Running Out of Food in the Last Year: Never true    Ran Out of Food in the Last Year: Never true  Transportation Needs: No Transportation Needs (03/27/2021)   PRAPARE - TrHydrologistMedical): No     Lack of Transportation (Non-Medical): No  Physical Activity: Insufficiently Active (03/27/2021)   Exercise Vital Sign    Days of Exercise per Week: 2 days    Minutes of Exercise per Session: 30 min  Stress: No Stress Concern Present (03/27/2021)   [place]  Feeling of Stress : Not at all  Social Connections: Moderately Isolated (03/27/2021)   Social Connection and Isolation Panel [NHANES]    Frequency of Communication with Friends and Family: Twice a week    Frequency of Social Gatherings with Friends and Family: Twice a week    Attends Religious Services: Never    AcMarine scientistr Organizations: Yes    Attends ClMusic therapistMore than 4 times per year    Marital Status: Widowed  Intimate Partner Violence: Not At Risk (03/27/2021)  Humiliation, Afraid, Rape, and Kick questionnaire    Fear of Current or Ex-Partner: No    Emotionally Abused: No    Physically Abused: No    Sexually Abused: No    FAMILY HISTORY: Family History  Problem Relation Age of Onset   Ovarian cancer Mother    Cancer Mother        Ovarian   Heart disease Mother        Before age 12   Heart attack Mother    Varicose Veins Mother    Heart disease Father        Before age 45   Hyperlipidemia Father    Hypertension Father    Heart attack Father    Heart disease Sister        Before age 24   Hypertension Sister    Heart attack Sister 33       second heart attack at 60   Heart disease Brother        Before age 52   Hypertension Brother    Heart attack Brother 41   Cancer Maternal Aunt        GYN cancer   Heart attack Maternal Grandmother    Alcohol abuse Maternal Grandfather    Parkinson's disease Sister    ALS Brother     Review of Systems  Constitutional:  Negative for appetite change, chills, fatigue, fever and unexpected weight change.  HENT:   Negative for hearing loss, lump/mass and trouble  swallowing.   Eyes:  Negative for eye problems and icterus.  Respiratory:  Negative for chest tightness, cough and shortness of breath.   Cardiovascular:  Negative for chest pain, leg swelling and palpitations.  Gastrointestinal:  Negative for abdominal distention, abdominal pain, constipation, diarrhea, nausea and vomiting.  Endocrine: Negative for hot flashes.  Genitourinary:  Negative for difficulty urinating.   Musculoskeletal:  Negative for arthralgias.  Skin:  Negative for itching and rash.  Neurological:  Negative for dizziness, extremity weakness, headaches and numbness.  Hematological:  Negative for adenopathy. Does not bruise/bleed easily.  Psychiatric/Behavioral:  Negative for depression. The patient is not nervous/anxious.       PHYSICAL EXAMINATION  ECOG PERFORMANCE STATUS: 1 - Symptomatic but completely ambulatory  Vitals:   12/04/21 1051  BP: (!) 142/56  Pulse: (!) 59  Resp: 18  Temp: (!) 97.4 F (36.3 C)  SpO2: 100%    Physical Exam Constitutional:      General: She is not in acute distress.    Appearance: Normal appearance. She is not toxic-appearing.  HENT:     Head: Normocephalic and atraumatic.  Eyes:     General: No scleral icterus. Cardiovascular:     Rate and Rhythm: Normal rate and regular rhythm.     Pulses: Normal pulses.     Heart sounds: Normal heart sounds.  Pulmonary:     Effort: Pulmonary effort is normal.     Breath sounds: Normal breath sounds.  Chest:     Comments: Left breast status postlumpectomy and radiation no sign of local recurrence right breast is benign. Abdominal:     General: Abdomen is flat. Bowel sounds are normal. There is no distension.     Palpations: Abdomen is soft.     Tenderness: There is no abdominal tenderness.  Musculoskeletal:        General: No swelling.     Cervical back: Neck supple.  Lymphadenopathy:     Cervical: No cervical adenopathy.  Skin:    General:  Skin is warm and dry.     Findings: No  rash.  Neurological:     General: No focal deficit present.     Mental Status: She is alert.  Psychiatric:        Mood and Affect: Mood normal.        Behavior: Behavior normal.     LABORATORY DATA: None for this visit   ASSESSMENT and THERAPY PLAN:   Cancer of lower-inner quadrant of left female breast (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  Denajah continues on observation alone and has no clinical or radiographic signs of breast cancer recurrence.  We discussed modifiable risk factors for breast cancer risk reduction 1 of which is healthy diet/exercise/weight loss.  She is up-to-date with her mammograms and is due for repeat in October 2024.  She is also due for repeat bone density testing at [facility] since this was last done in 2021.  I placed orders for bone density testing and asked my nurse to fax them to [facility].  Lindsee will return next year for continued follow-up and surveillance.    All questions were answered. The patient knows to call the clinic with any problems, questions or concerns. We can certainly see the patient much sooner if necessary.  Total encounter time:20 minutes*in face-to-face visit time, chart review, lab review, care coordination, order entry, and documentation of the encounter time.    Wilber Bihari, NP 12/04/21 3:27 PM Medical Oncology and Hematology [facility] [place], [place] [address] Tel. [phone]    Fax. [phone]  *Total Encounter Time as defined by the Centers for Medicare and Medicaid Services includes, in addition to the face-to-face time of a patient visit (documented in the note above) non-face-to-face time: obtaining and reviewing outside history, ordering and reviewing medications, tests or procedures, care  coordination (communications with other health care professionals or caregivers) and documentation in the medical record.

## 2021-12-05 ENCOUNTER — Telehealth: Payer: Self-pay | Admitting: Adult Health

## 2021-12-05 NOTE — Telephone Encounter (Signed)
Scheduled appointment per 11/27 los. Left voicemail. 

## 2021-12-14 DIAGNOSIS — M85852 Other specified disorders of bone density and structure, left thigh: Secondary | ICD-10-CM | POA: Diagnosis not present

## 2021-12-14 DIAGNOSIS — M85851 Other specified disorders of bone density and structure, right thigh: Secondary | ICD-10-CM | POA: Diagnosis not present

## 2021-12-14 DIAGNOSIS — Z780 Asymptomatic menopausal state: Secondary | ICD-10-CM | POA: Diagnosis not present

## 2021-12-26 ENCOUNTER — Encounter: Payer: Self-pay | Admitting: Cardiology

## 2021-12-26 ENCOUNTER — Telehealth: Payer: Self-pay | Admitting: *Deleted

## 2021-12-26 ENCOUNTER — Ambulatory Visit: Payer: Medicare Other | Attending: Cardiology | Admitting: Cardiology

## 2021-12-26 VITALS — BP 140/82 | HR 62 | Ht 62.0 in | Wt 193.0 lb

## 2021-12-26 DIAGNOSIS — I2510 Atherosclerotic heart disease of native coronary artery without angina pectoris: Secondary | ICD-10-CM

## 2021-12-26 DIAGNOSIS — E785 Hyperlipidemia, unspecified: Secondary | ICD-10-CM

## 2021-12-26 DIAGNOSIS — G4733 Obstructive sleep apnea (adult) (pediatric): Secondary | ICD-10-CM | POA: Diagnosis not present

## 2021-12-26 DIAGNOSIS — I10 Essential (primary) hypertension: Secondary | ICD-10-CM

## 2021-12-26 MED ORDER — CARVEDILOL 6.25 MG PO TABS: 6.2500 mg | ORAL_TABLET | Freq: Two times a day (BID) | ORAL | 3 refills | Status: DC

## 2021-12-26 NOTE — Progress Notes (Signed)
Cardiology Office Note:    Date:  12/26/2021   ID:  Chelsea Moreno, Chelsea Moreno 1950-05-14, MRN [id number]  PCP:  Binnie Rail, MD  Cardiologist:  Donato Heinz, MD  Electrophysiologist:  None   Referring MD: Binnie Rail, MD   Chief Complaint  Patient presents with   Follow-up    6 months.   Coronary Artery Disease    History of Present Illness:    Chelsea Moreno is a 71 y.o. female with a hx of breast cancer, diabetes insipidus, hypertension, hyperlipidemia, OSA on CPAP who presents for follow-up.  She was referred by Dr. Quay Burow for evaluation of chest pain, initially seen on 11/30/2019.  She reports that on 11/14/2019, she woke up with chest pain.  Describes dull aching pain in the center of her chest, also with tightness in her throat and radiation to her back.  States the pain was 10 out of 10.  Episode lasted about 10 minutes.  She has had no further episodes of chest pain.  She does not exercise regularly, but does yoga once per week.  Denies any shortness of breath.  Reports occasional lightheadedness, thought to be due to vertigo.  She reports that she has had multiple episodes of syncope, about 1-2 times per year x15 years.  Episodes are associated with nausea/vomiting/diarrhea.  She has not had an episode of syncope over 2 years.  She denies any palpitations.  Reports occasional lower extremity edema.  She did receive radiation treatment for her breast cancer.  She has a history of diabetes insipidus, unclear cause but thought to be due to head trauma as a child.  She smoked for 3 to 4 years, quit in 1974.  Family history includes sister had MI and CVA in early 30s.  Father died of MI in 31s.  Brother had MI in 76s.  Echocardiogram on 12/28/2019 showed normal biventricular function, no significant valvular disease.  Lexiscan Myoview on 12/28/2019 showed EF 63%, apical inferior/inferolateral perfusion defect likely consistent with artifact.  Calcium score on 12/28/2019 was 1202  (98th percentile).  Since last clinic visit, she reports she has been doing okay.  Denies any chest pain, dyspnea, lower extremity edema, or palpitations.  Had episode of back pain that woke her up recently.  She reports occasional lightheadedness but denies any syncope.  Has not been checking blood pressure at home.   Past Medical History:  Diagnosis Date   Breast cancer ([place]) 11/05/12   left   Bursitis    COLONIC POLYPS, HX OF 09/24/2008   DEPRESSION 09/23/2007   takes Lexapro daily but hasn't taken since Sept 2014   Diabetes insipidus [facility])    takes Desmopressin bid   Diarrhea    Dizziness    when nauseated gets dizzy   Family history of ovarian cancer    Hemorrhoids    History of bronchitis    yrs ago   History of migraine    last time many yrs ago   Hx of radiation therapy 01/29/13- 03/16/13   left breast 4500 cGy 25 sessions, left breast boost 1000 cGy 5 sessions   HYPERLIPIDEMIA 09/06/2006   takes Atorvasatin nightly   HYPERTENSION 09/06/2006   takes Lisinopril daily   Muscle spasms of head and/or neck    OSTEOPOROSIS 09/24/2008   takes Actonel every 30days   Vitamin D deficiency    takes OTC Vit D    Past Surgical History:  Procedure Laterality Date   BREAST BIOPSY Left 56yr  ago   BREAST LUMPECTOMY WITH NEEDLE LOCALIZATION Left 12/16/2012   Procedure: PARTIAL MASTECTOMY WITH NEEDLE LOCALIZATION;  Surgeon: Stark Klein, MD;  Location: [place] [facility];  Service: General;  Laterality: Left;  1:00 NL at [facility]    COLONOSCOPY      Current Medications: Current Meds  Medication Sig   aspirin EC 81 MG tablet Take 1 tablet (81 mg total) by mouth daily. Swallow whole.   atorvastatin (LIPITOR) 80 MG tablet TAKE 1 TABLET DAILY   Brinzolamide-Brimonidine (SIMBRINZA) 1-0.2 % SUSP Apply to eye.   carvedilol (COREG) 6.25 MG tablet Take 1 tablet (6.25 mg total) by mouth 2 (two) times daily.   desmopressin (DDAVP NASAL) 0.01 % solution USE 1 SPRAY (10 MCG) IN EACH NOSTRIL TWICE A DAY    inclisiran (LEQVIO) 284 MG/1.5ML SOSY injection Inject 284 mg into the skin every 6 (six) months.   lisinopril (ZESTRIL) 40 MG tablet TAKE 1 TABLET DAILY   ondansetron (ZOFRAN ODT) 4 MG disintegrating tablet Take 1 tablet (4 mg total) by mouth every 8 (eight) hours as needed for nausea or vomiting.   VYZULTA 0.024 % SOLN INSTILL 1 DROP INTO EACH EYE ONCE DAILY AT BEDTIME   [DISCONTINUED] metoprolol succinate (TOPROL-XL) 50 MG 24 hr tablet TAKE 1 TABLET DAILY. TAKE WITH OR IMMEDIATELY FOLLOWING A MEAL.     Allergies:   Patient has no known allergies.   Social History   Socioeconomic History   Marital status: Widowed    Spouse name: Not on file   Number of children: Not on file   Years of education: Not on file   Highest education level: Not on file  Occupational History   Occupation: Professor at [employer]   Smoking status: Former    Packs/day: 0.50    Years: 4.00    Total pack years: 2.00    Types: Cigarettes    Quit date: 01/07/1973    Years since quitting: 49.0   Smokeless tobacco: Never   Tobacco comments:    quit 55yr ago  Substance and Sexual Activity   Alcohol use: Yes    Alcohol/week: 0.0 standard drinks of alcohol    Comment: socailly   Drug use: No   Sexual activity: Not Currently    Birth control/protection: Post-menopausal    Comment: menarche age 89 first live birth age 89 menopause 2002, no HRT  Other Topics Concern   Not on file  Social History Narrative   Not on file   Social Determinants of Health   Financial Resource Strain: Low Risk  (03/27/2021)   Overall Financial Resource Strain (CARDIA)    Difficulty of Paying Living Expenses: Not hard at all  Food Insecurity: No Food Insecurity (03/27/2021)   Hunger Vital Sign    Worried About Running Out of Food in the Last Year: Never true    Ran Out of Food in the Last Year: Never true  Transportation Needs: No Transportation Needs (03/27/2021)   PRAPARE - THydrologist (Medical): No    Lack of Transportation (Non-Medical): No  Physical Activity: Insufficiently Active (03/27/2021)   Exercise Vital Sign    Days of Exercise per Week: 2 days    Minutes of Exercise per Session: 30 min  Stress: No Stress Concern Present (03/27/2021)   [place]   Feeling of Stress : Not at all  Social Connections: Moderately Isolated (03/27/2021)   Social Connection and Isolation Panel [  NHANES]    Frequency of Communication with Friends and Family: Twice a week    Frequency of Social Gatherings with Friends and Family: Twice a week    Attends Religious Services: Never    Marine scientist or Organizations: Yes    Attends Music therapist: More than 4 times per year    Marital Status: Widowed     Family History: The patient's family history includes ALS in her brother; Alcohol abuse in her maternal grandfather; Cancer in her maternal aunt and mother; Heart attack in her father, maternal grandmother, and mother; Heart attack (age of onset: 43) in her brother; Heart attack (age of onset: 28) in her sister; Heart disease in her brother, father, mother, and sister; Hyperlipidemia in her father; Hypertension in her brother, father, and sister; Ovarian cancer in her mother; Parkinson's disease in her sister; Varicose Veins in her mother.  ROS:   Please see the history of present illness.     All other systems reviewed and are negative.  EKGs/Labs/Other Studies Reviewed:    The following studies were reviewed today:   EKG:   12/26/2021: Normal sinus rhythm, poor R wave progression, nonspecific T wave flattening  Recent Labs: 03/29/2021: Hemoglobin 13.0; Platelets 294.0 06/07/2021: ALT 26 08/30/2021: BUN 12; Creatinine, Ser 0.61; Potassium 4.1; Sodium 132; TSH 0.94  Recent Lipid Panel    Component Value Date/Time   CHOL 85 (L) 06/07/2021 1055   TRIG 106 06/07/2021 1055   HDL 37 (L)  06/07/2021 1055   CHOLHDL 2.3 06/07/2021 1055   CHOLHDL 4 03/28/2020 1211   VLDL 32.4 03/28/2020 1211   LDLCALC 28 06/07/2021 1055   LDLCALC 155 (H) 09/28/2019 1156   LDLDIRECT 171.8 03/21/2011 0936    Physical Exam:    VS:  BP (!) 140/82 (BP Location: Right Arm, Patient Position: Sitting, Cuff Size: Large)   Pulse 62   Ht '5\' 2"'$  (1.575 m)   Wt 193 lb (87.5 kg)   BMI 35.30 kg/m     Wt Readings from Last 3 Encounters:  12/26/21 193 lb (87.5 kg)  12/04/21 195 lb 9.6 oz (88.7 kg)  10/04/21 194 lb (88 kg)     GEN:  Well nourished, well developed in no acute distress HEENT: Normal NECK: No JVD; No carotid bruits LYMPHATICS: No lymphadenopathy CARDIAC: RRR, no murmurs, rubs, gallops RESPIRATORY:  Clear to auscultation without rales, wheezing or rhonchi  ABDOMEN: Soft, non-tender, non-distended MUSCULOSKELETAL:  No edema; No deformity  SKIN: Warm and dry NEUROLOGIC:  Alert and oriented x 3 PSYCHIATRIC:  Normal affect   ASSESSMENT:    1. CAD in native artery   2. Essential hypertension   3. Hyperlipidemia, unspecified hyperlipidemia type   4. OSA (obstructive sleep apnea)     PLAN:    CAD: Reported atypical chest pain.  Echocardiogram on 12/28/2019 showed normal biventricular function, no significant valvular disease.  Lexiscan Myoview on 12/28/2019 showed EF 63%, apical inferior/inferolateral perfusion defect likely consistent with artifact.  Calcium score on 12/28/2019 was 1202 (98th percentile). -LDL remained above goal despite atorvastatin and Zetia.  She was started on inclisiran, most recent LDL 28 on 06/07/2021 -She stopped taking aspirin, recommend restarting aspirin 81 mg daily  Hypertension: On lisinopril 40 mg daily and Toprol-XL 50 mg daily.  BP elevated, suspect untreated OSA contributing, she has been having issues with her CPAP, recommend discussing with sleep medicine.  Will change Toprol-XL to carvedilol 6.25 mg twice daily for better BP control.  Asked  to  check BP daily for next 2 weeks and call with results  Hyperlipidemia: LDL above goal despite atorvastatin and Zetia.  She was started on inclisiran, most recent LDL 28 on 06/07/2021  OSA: had to stop using CPAP due to mouth ulcers.  Recommend discussing with sleep medicine  RTC in 1 year     Medication Adjustments/Labs and Tests Ordered: Current medicines are reviewed at length with the patient today.  Concerns regarding medicines are outlined above.  Orders Placed This Encounter  Procedures   EKG 12-Lead   Meds ordered this encounter  Medications   carvedilol (COREG) 6.25 MG tablet    Sig: Take 1 tablet (6.25 mg total) by mouth 2 (two) times daily.    Dispense:  180 tablet    Refill:  3    Stop Toprol XL    Patient Instructions  Medication Instructions:  STOP metoprolol (Toprol XL) START carvedilol (Coreg) 6.25 mg two times daily RESTART aspirin 81 mg daily  Please check your blood pressure at home daily, write it down.  Call the office or send message via Mychart with the readings in 2 weeks for Dr. Gardiner Rhyme to review.   *If you need a refill on your cardiac medications before your next appointment, please call your pharmacy*  Follow-Up: At [facility], you and your health needs are our priority.  As part of our continuing mission to provide you with exceptional heart care, we have created designated Provider Care Teams.  These Care Teams include your primary Cardiologist (physician) and Advanced Practice Providers (APPs -  Physician Assistants and Nurse Practitioners) who all work together to provide you with the care you need, when you need it.  We recommend signing up for the patient portal called "MyChart".  Sign up information is provided on this After Visit Summary.  MyChart is used to connect with patients for Virtual Visits (Telemedicine).  Patients are able to view lab/test results, encounter notes, upcoming appointments, etc.  Non-urgent messages can be  sent to your provider as well.   To learn more about what you can do with MyChart, go to [email or web address].    Your next appointment:   12 month(s)  The format for your next appointment:   In Person  Provider:   Donato Heinz, MD     Other Instructions Please reach out to Dr. Halford Chessman to discuss issues with CPAP.      Signed, Donato Heinz, MD  12/26/2021 11:14 PM    [facility]

## 2021-12-26 NOTE — Telephone Encounter (Signed)
Called pt per Chelsea Moreno & informed that Bone Density showed Osteopenia & she should start Calcium & Vit D & do some weight bearing exercises & repeat scan in 2 years.  We discussed Calcium 600 mg daily & 2000 unit Vit D daily.  She expressed understanding.

## 2021-12-26 NOTE — Patient Instructions (Signed)
Medication Instructions:  STOP metoprolol (Toprol XL) START carvedilol (Coreg) 6.25 mg two times daily RESTART aspirin 81 mg daily  Please check your blood pressure at home daily, write it down.  Call the office or send message via Mychart with the readings in 2 weeks for Dr. Gardiner Rhyme to review.   *If you need a refill on your cardiac medications before your next appointment, please call your pharmacy*  Follow-Up: At [facility], you and your health needs are our priority.  As part of our continuing mission to provide you with exceptional heart care, we have created designated Provider Care Teams.  These Care Teams include your primary Cardiologist (physician) and Advanced Practice Providers (APPs -  Physician Assistants and Nurse Practitioners) who all work together to provide you with the care you need, when you need it.  We recommend signing up for the patient portal called "MyChart".  Sign up information is provided on this After Visit Summary.  MyChart is used to connect with patients for Virtual Visits (Telemedicine).  Patients are able to view lab/test results, encounter notes, upcoming appointments, etc.  Non-urgent messages can be sent to your provider as well.   To learn more about what you can do with MyChart, go to [email or web address].    Your next appointment:   12 month(s)  The format for your next appointment:   In Person  Provider:   Donato Heinz, MD     Other Instructions Please reach out to Dr. Halford Chessman to discuss issues with CPAP.

## 2021-12-29 ENCOUNTER — Encounter: Payer: Self-pay | Admitting: Adult Health

## 2022-01-31 ENCOUNTER — Encounter: Payer: Self-pay | Admitting: Cardiology

## 2022-01-31 DIAGNOSIS — I10 Essential (primary) hypertension: Secondary | ICD-10-CM

## 2022-02-04 NOTE — Telephone Encounter (Signed)
Recommend checking BP twice daily for next week and would schedule in pharmacy hypertension clinic.  Would ask to bring home BP log and home monitor to calibrate to appointment

## 2022-02-27 NOTE — Telephone Encounter (Signed)
Yes that is okay to increase her heart rate to that level

## 2022-03-01 ENCOUNTER — Ambulatory Visit: Payer: Medicare Other | Attending: Cardiology | Admitting: Student

## 2022-03-01 ENCOUNTER — Encounter: Payer: Self-pay | Admitting: Student

## 2022-03-01 DIAGNOSIS — I10 Essential (primary) hypertension: Secondary | ICD-10-CM | POA: Diagnosis not present

## 2022-03-01 NOTE — Assessment & Plan Note (Signed)
Assessment: BP is controlled in office BP 29/75 mmHg at the goal <130/80 However home BP ~ 145/90 with heart rate 65  Home cuff validated and found out to be accurate  Tolerates current BP medications well without any side effects  Denies SOB, palpitation, chest pain, headaches,or swelling Does regular exercise and follow low salt diet    Plan:  Given at goal BP in office will not make any changes to current BP medications  PharmD to follow up with patient in 2 weeks via MyChart on home BP readings if remain above the goal will up the carvedilol dose  Continue taking current BP mediations-Lisinopril 40 mg daily,  Carvedilol 6.25 mg twice daily  Patient to keep record of BP readings with heart rate and report to [place] at the next visit Patient to see  PharmD in 4 weeks for follow up (OV) Follow up lab(s):none

## 2022-03-01 NOTE — Patient Instructions (Signed)
No changes made by your pharmacist Cammy Copa, PharmD at today's visit:    Bring  your BP  record of home blood pressures to your next appointment.    HOW TO TAKE YOUR BLOOD PRESSURE AT HOME  Rest 5 minutes before taking your blood pressure.  Don't smoke or drink caffeinated beverages for at least 30 minutes before. Take your blood pressure before (not after) you eat. Sit comfortably with your back supported and both feet on the floor (don't cross your legs). Elevate your arm to heart level on a table or a desk. Use the proper sized cuff. It should fit smoothly and snugly around your bare upper arm. There should be enough room to slip a fingertip under the cuff. The bottom edge of the cuff should be 1 inch above the crease of the elbow. Ideally, take 3 measurements at one sitting and record the average.  Important lifestyle changes to control high blood pressure  Intervention  Effect on the BP  Lose extra pounds and watch your waistline Weight loss is one of the most effective lifestyle changes for controlling blood pressure. If you're overweight or obese, losing even a small amount of weight can help reduce blood pressure. Blood pressure might go down by about 1 millimeter of mercury (mm Hg) with each kilogram (about 2.2 pounds) of weight lost.  Exercise regularly As a general goal, aim for at least 30 minutes of moderate physical activity every day. Regular physical activity can lower high blood pressure by about 5 to 8 mm Hg.  Eat a healthy diet Eating a diet rich in whole grains, fruits, vegetables, and low-fat dairy products and low in saturated fat and cholesterol. A healthy diet can lower high blood pressure by up to 11 mm Hg.  Reduce salt (sodium) in your diet Even a small reduction of sodium in the diet can improve heart health and reduce high blood pressure by about 5 to 6 mm Hg.  Limit alcohol One drink equals 12 ounces of beer, 5 ounces of wine, or 1.5 ounces of 80-proof  liquor.  Limiting alcohol to less than one drink a day for women or two drinks a day for men can help lower blood pressure by about 4 mm Hg.   If you have any questions or concerns please use My Chart to send questions or call the office at [phone]

## 2022-03-01 NOTE — Progress Notes (Addendum)
Patient ID: Chelsea Moreno                 DOB: May 08, 1950                      MRN: [id number]      HPI: Chelsea Moreno is a 72 y.o. female referred by Dr. Gardiner Rhyme to HTN clinic. PMH is significant for diabetes insipidus, OSA, CAD, HDL, obesity, pre-diabetes (last A1c 5.9 10/04/2021)   Patient presented today with her home BP monitor for validation. Her home BP ~145/90 with HR 65. She has been eating low salt diet and exercising regularly. Has been taking mediations regularly and toleareting them well. She was on amlodipine which was irritating her gums and causing lower extremities edema so it was switched to metoprolol. Metoprolol was not lowering BP enough so it was switched carvedilol and she thinks carvedilol dose is not enough to bring BP at goal. We validated home cuff today in the office and it was accurate.  Date SBP/DBP  HR  1st on home monitor  127/85 61  1st on office cuff office  129/75 66  2nd on home monitor  132/82 62  2nd on office cuff  134/79 67      Family History:      Relation Problem Comments  Mother (Deceased at age 32) Cancer Ovarian  Heart attack   Heart disease Before age 93  Ovarian cancer   Varicose Veins     Father (Deceased at age 81) Heart attack   Heart disease Before age 48  Hyperlipidemia   Hypertension     Sister (Alive) Heart attack (Age: 34) second heart attack at 28  Heart disease Before age 43  Hypertension     Sister Associate Professor) Parkinson's disease     Brother (Alive) Heart attack (Age: 89)   Heart disease Before age 7  Hypertension     Brother (Deceased) ALS     Maternal Aunt (Deceased) Cancer GYN cancer    Maternal Grandmother (Deceased) Heart attack     Maternal Grandfather (Deceased) Alcohol abuse        Social History:  Alcohol:none  Smoking:never  Recreational drug use: never   Diet: healthy low salt diet, does not eat out.   Exercise: [employer] -30 min cardio 3 day week with weight resistance  Yoga 3 days week when  not doing Cardio - so end up going to [facility] 5 days of the    Home BP readings: 145/90 HR 65    Wt Readings from Last 3 Encounters:  12/26/21 193 lb (87.5 kg)  12/04/21 195 lb 9.6 oz (88.7 kg)  10/04/21 194 lb (88 kg)   BP Readings from Last 3 Encounters:  12/26/21 (!) 140/82  12/04/21 (!) 159/69  12/04/21 (!) 142/56   Pulse Readings from Last 3 Encounters:  12/26/21 62  12/04/21 (!) 59  12/04/21 (!) 59    Renal function: CrCl cannot be calculated (Patient's most recent lab result is older than the maximum 21 days allowed.).     Current Outpatient Medications on File Prior to Visit  Medication Sig Dispense Refill   aspirin EC 81 MG tablet Take 1 tablet (81 mg total) by mouth daily. Swallow whole. 30 tablet 12   atorvastatin (LIPITOR) 80 MG tablet TAKE 1 TABLET DAILY 90 tablet 3   Brinzolamide-Brimonidine (SIMBRINZA) 1-0.2 % SUSP Apply to eye.     carvedilol (COREG) 6.25 MG tablet Take 1 tablet (6.25 mg total)  by mouth 2 (two) times daily. 180 tablet 3   desmopressin (DDAVP NASAL) 0.01 % solution USE 1 SPRAY (10 MCG) IN EACH NOSTRIL TWICE A DAY 10 mL 13   inclisiran (LEQVIO) 284 MG/1.5ML SOSY injection Inject 284 mg into the skin every 6 (six) months.     lisinopril (ZESTRIL) 40 MG tablet TAKE 1 TABLET DAILY 30 tablet 11   ondansetron (ZOFRAN ODT) 4 MG disintegrating tablet Take 1 tablet (4 mg total) by mouth every 8 (eight) hours as needed for nausea or vomiting. 30 tablet 0   VYZULTA 0.024 % SOLN INSTILL 1 DROP INTO EACH EYE ONCE DAILY AT BEDTIME     No current facility-administered medications on file prior to visit.    No Known Allergies  There were no vitals taken for this visit.   Current HTN meds: Lisinopril 40 mg daily,  Carvedilol 6.25 mg twice daily  Previously tried: metoprolol- not enough BP lowering effect, amlodipine -swelling in gums ankle swelling  BP goal: <130/80    Essential hypertension Assessment: BP is controlled in office BP 29/75 mmHg at the  goal <130/80 However home BP ~ 145/90 with heart rate 65  Home cuff validated and found out to be accurate  Tolerates current BP medications well without any side effects  Denies SOB, palpitation, chest pain, headaches,or swelling Does regular exercise and follow low salt diet    Plan:  Given at goal BP in office will not make any changes to current BP medications  PharmD to follow up with patient in 2 weeks via MyChart on home BP readings if remain above the goal will up the carvedilol dose  Continue taking current BP mediations-Lisinopril 40 mg daily,  Carvedilol 6.25 mg twice daily  Patient to keep record of BP readings with heart rate and report to [place] at the next visit Patient to see  PharmD in 4 weeks for follow up (OV) Follow up lab(s):none       Thank you  Cammy Copa, Pharm.D [facility] of [facility] [address] [place], [place] [address]  Phone: [phone]; Fax: [phone]

## 2022-03-07 DIAGNOSIS — D485 Neoplasm of uncertain behavior of skin: Secondary | ICD-10-CM | POA: Diagnosis not present

## 2022-03-07 DIAGNOSIS — H10413 Chronic giant papillary conjunctivitis, bilateral: Secondary | ICD-10-CM | POA: Diagnosis not present

## 2022-03-21 ENCOUNTER — Telehealth: Payer: Self-pay | Admitting: Pharmacist

## 2022-03-21 NOTE — Telephone Encounter (Signed)
F/U on BP  Patient reports her BP trending in 130-140/80 range in the morning and 120-130/70 range in the evening. Once in while she would have butterfly like feeling in her chest. Which comes and go even when she is resting. Her HR ~ low 60's  Reiterated importance of low salt intake and encouraged her to bring BP log at the next OV on 04/02/2022.  In future if BP stays above the goal may consider increasing dose of carvedilol from 6.25 mg twice daily to 12.5 mg twice daily.

## 2022-04-02 ENCOUNTER — Ambulatory Visit
Payer: Medicare Other | Attending: Cardiovascular Disease | Admitting: Pharmacist Clinician (PhC)/ Clinical Pharmacy Specialist

## 2022-04-02 ENCOUNTER — Encounter: Payer: Self-pay | Admitting: Pharmacist Clinician (PhC)/ Clinical Pharmacy Specialist

## 2022-04-02 VITALS — BP 137/83 | HR 76

## 2022-04-02 DIAGNOSIS — I10 Essential (primary) hypertension: Secondary | ICD-10-CM | POA: Insufficient documentation

## 2022-04-02 DIAGNOSIS — E785 Hyperlipidemia, unspecified: Secondary | ICD-10-CM | POA: Diagnosis not present

## 2022-04-02 NOTE — Patient Instructions (Signed)
  Go to the lab at your convenience to check Lp(a) - inflammatory cholesterol marker  Take your BP meds as follows:  Switch lisinopril from 40 mg each night to 20 mg twice daily  Continue with all other medications  Check your blood pressure at home daily (if able) and keep record of the readings.  Hypertension "High blood pressure"  Hypertension is often called "The Silent Killer." It rarely causes symptoms until it is extremely  high or has done damage to other organs in the body. For this reason, you should have your  blood pressure checked regularly by your physician. We will check your blood pressure  every time you see a provider at one of our offices.   Your blood pressure reading consists of two numbers. Ideally, blood pressure should be  below 120/80. The first ("top") number is called the systolic pressure. It measures the  pressure in your arteries as your heart beats. The second ("bottom") number is called the diastolic pressure. It measures the pressure in your arteries as the heart relaxes between beats.  The benefits of getting your blood pressure under control are enormous. A 10-point  reduction in systolic blood pressure can reduce your risk of stroke by 27% and heart failure by 28%  Your blood pressure goal is <130/80  To check your pressure at home you will need to:  1. Sit up in a chair, with feet flat on the floor and back supported. Do not cross your ankles or legs. 2. Rest your left arm so that the cuff is about heart level. If the cuff goes on your upper arm,  then just relax the arm on the table, arm of the chair or your lap. If you have a wrist cuff, we  suggest relaxing your wrist against your chest (think of it as Pledging the Flag with the  wrong arm).  3. Place the cuff snugly around your arm, about 1 inch above the crook of your elbow. The  cords should be inside the groove of your elbow.  4. Sit quietly, with the cuff in place, for about 5 minutes.  After that 5 minutes press the power  button to start a reading. 5. Do not talk or move while the reading is taking place.  6. Record your readings on a sheet of paper. Although most cuffs have a memory, it is often  easier to see a pattern developing when the numbers are all in front of you.  7. You can repeat the reading after 1-3 minutes if it is recommended  Make sure your bladder is empty and you have not had caffeine or tobacco within the last 30 min  Always bring your blood pressure log with you to your appointments. If you have not brought your monitor in to be double checked for accuracy, please bring it to your next appointment.  You can find a list of quality blood pressure cuffs at validatebp.org

## 2022-04-02 NOTE — Progress Notes (Signed)
Office Visit    Patient Name: Chelsea Moreno Date of Encounter: 04/02/2022  Primary Care Provider:  Binnie Rail, MD Primary Cardiologist:  Chelsea Heinz, MD  Chief Complaint    Hypertension  Significant Past Medical History   ASCVD CAC 1202 (98th percentile)  Venous insufficiency chronic  hyperlipidemia 5/23 LDL 28 on atorvastatin 80 , Leqvio 284 mg  Pre-diabetes 3/23 A1c 6.3       No Known Allergies  History of Present Illness    Chelsea Moreno is a 72 y.o. female patient of Chelsea Moreno, in the office today for follow up of hypertension management.  She was most recently seen by Chelsea Moreno last month, at which time his pressure was good at 129/75.  No medication changes were made, but he was asked to follow up in a month to be sure readings are stable.  Today she returns with home readings that show well controlled morning blood pressures, with higher evening results.    Blood Pressure Goal:  130/80  Current Medications:  lisinopril 40 mg qd (hs), carvedilol 6.25 mg bid  Previously tried:  metoprolol - not enough BP lowering; amlodipine - edema in gums and ankles  Family Hx: mother had MI in her 93's (mild), died from ovarian cancer at 67; father died from MI in his 32's, sister had MI x 3 and CVA in her early 46's, died at 79 undetermined causes, broth had MI in his 54's CABG at 2, died 48 months ago at 72; one brother died ALS at 72, 1 sister living with Parkinsons; son has hypertension, high stress job, had scare with BP high enough to go to ED   Social Hx:      Tobacco: quit in 1974  Alcohol: none  Caffeine: diet Mt Dew to get going in the am, some tea  Diet:   mostly home cooked meals, no fast food, only with company; has problems with oral sores/ has to avoid sharp edges, spices; sometimes just limited to soft foods - yesterday green beans, potatoes, chicken breast   Exercise: started in January - [employer] 3 days per week 30 min on treadmill, 30 min  resistance; yoga 3 days per week  Home BP readings:    AM - 12 readings average 125/72  HR 60  (range 111-138/65-76)  PM - 11 readings average 145/82  HR 61  (range 133-159/72-87)   Accessory Clinical Findings    Lab Results  Component Value Date   CREATININE 0.61 08/30/2021   BUN 12 08/30/2021   NA 132 (L) 08/30/2021   K 4.1 08/30/2021   CL 99 08/30/2021   CO2 27 08/30/2021   Lab Results  Component Value Date   ALT 26 06/07/2021   AST 32 06/07/2021   ALKPHOS 84 06/07/2021   BILITOT 0.5 06/07/2021   Lab Results  Component Value Date   HGBA1C 5.9 (A) 10/04/2021   HGBA1C 5.9 10/04/2021   HGBA1C 5.9 10/04/2021   HGBA1C 5.9 10/04/2021    Home Medications    Current Outpatient Medications  Medication Sig Dispense Refill   aspirin EC 81 MG tablet Take 1 tablet (81 mg total) by mouth daily. Swallow whole. 30 tablet 12   atorvastatin (LIPITOR) 80 MG tablet TAKE 1 TABLET DAILY 90 tablet 3   Calcium Carb-Cholecalciferol (CALTRATE 600+D3) 600-20 MG-MCG TABS Take 1 tablet by mouth daily.     carvedilol (COREG) 6.25 MG tablet Take 1 tablet (6.25 mg total) by mouth 2 (two)  times daily. 180 tablet 3   Cholecalciferol (VITAMIN D3) 25 MCG (1000 UT) capsule Take 2 capsules by mouth daily.     desmopressin (DDAVP NASAL) 0.01 % solution USE 1 SPRAY (10 MCG) IN EACH NOSTRIL TWICE A DAY 10 mL 13   dorzolamide (TRUSOPT) 2 % ophthalmic solution Place 1 drop into both eyes 2 (two) times daily.     inclisiran (LEQVIO) 284 MG/1.5ML SOSY injection Inject 284 mg into the skin every 6 (six) months.     lisinopril (ZESTRIL) 40 MG tablet TAKE 1 TABLET DAILY 30 tablet 11   ondansetron (ZOFRAN ODT) 4 MG disintegrating tablet Take 1 tablet (4 mg total) by mouth every 8 (eight) hours as needed for nausea or vomiting. 30 tablet 0   VYZULTA 0.024 % SOLN INSTILL 1 DROP INTO EACH EYE ONCE DAILY AT BEDTIME     No current facility-administered medications for this visit.         Assessment & Plan     Essential hypertension Assessment: BP is uncontrolled in office BP 137/83 mmHg;  above the goal (<130/80). Evening readings are 20/10 points higher than morning Lisinopril is not holding for full 24 hours Tolerates lisinopril and carvedilol well without any side effects Denies SOB, palpitation, chest pain, headaches,or swelling Reiterated the importance of regular exercise and low salt diet   Plan:  Start taking lisinopril 20 mg twice daily rather than 40 mg once daily Continue taking carvedilol Patient to keep record of BP readings with heart rate and report to [place] at the next visit Patient to follow up with a MyChart message in the next few weeks home evening readings continue to be elevated  Labs ordered today:  none   Dyslipidemia Reviewed her CAC and what it means.  She would like to get Lp(a) level tested as well, to have as much knowledge as she can, based on her strong family history.   She is due for her next Leqvio injection later this week and will do lipid labs after a month or so.      Chelsea Moreno PharmD CPP [facility]  [address] [place], [place] [address] [phone]

## 2022-04-02 NOTE — Assessment & Plan Note (Addendum)
Assessment: BP is uncontrolled in office BP 137/83 mmHg;  above the goal (<130/80). Evening readings are 20/10 points higher than morning Lisinopril is not holding for full 24 hours Tolerates lisinopril and carvedilol well without any side effects Denies SOB, palpitation, chest pain, headaches,or swelling Reiterated the importance of regular exercise and low salt diet   Plan:  Start taking lisinopril 20 mg twice daily rather than 40 mg once daily Continue taking carvedilol Patient to keep record of BP readings with heart rate and report to [place] at the next visit Patient to follow up with a MyChart message in the next few weeks home evening readings continue to be elevated  Labs ordered today:  none

## 2022-04-02 NOTE — Assessment & Plan Note (Signed)
Reviewed her CAC and what it means.  She would like to get Lp(a) level tested as well, to have as much knowledge as she can, based on her strong family history.   She is due for her next Leqvio injection later this week and will do lipid labs after a month or so.

## 2022-04-03 NOTE — Patient Instructions (Addendum)
      Blood work was ordered.   The lab is on the first floor.    Medications changes include :   none    A referral was ordered for XXX.     Someone will call you to schedule an appointment.    Return in about 6 months (around 10/05/2022) for follow up.

## 2022-04-03 NOTE — Progress Notes (Unsigned)
Subjective:    Patient ID: Chelsea Moreno, female    DOB: November 09, 1950, 72 y.o.   MRN: [id number]     HPI Chelsea Moreno is here for follow up of her chronic medical problems.  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  Rinsing with H2O2 and salt.  Uses rinse PH balanced.  Has not been in contact with the oral surgeon like diagnosed her last year.  She has not been seeing improvement.  She does have difficulty eating.  Right leg and knee pain, hip pain.  Right leg is weaker.  Right knee only hurts when using it.  Not with walking but certain activiites.  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  Intermittent bitter sinus drainage.  Daily - can be several times a day.    Medications and allergies reviewed with patient and updated if appropriate.  Current Outpatient Medications on File Prior to Visit  Medication Sig Dispense Refill   aspirin EC 81 MG tablet Take 1 tablet (81 mg total) by mouth daily. Swallow whole. 30 tablet 12   atorvastatin (LIPITOR) 80 MG tablet TAKE 1 TABLET DAILY 90 tablet 3   Calcium Carb-Cholecalciferol (CALTRATE 600+D3) 600-20 MG-MCG TABS Take 1 tablet by mouth daily.     carvedilol (COREG) 6.25 MG tablet Take 1 tablet (6.25 mg total) by mouth 2 (two) times daily. 180 tablet 3   Cholecalciferol (VITAMIN D3) 25 MCG (1000 UT) capsule Take 2 capsules by mouth daily.     dorzolamide (TRUSOPT) 2 % ophthalmic solution Place 1 drop into both eyes 2 (two) times daily.     inclisiran (LEQVIO) 284 MG/1.5ML SOSY injection Inject 284 mg into the skin every 6 (six) months.     lisinopril (ZESTRIL) 40 MG tablet TAKE 1 TABLET DAILY (Patient taking differently: Take 40 mg by mouth daily. 1/2 table two times daily) 30 tablet 11   neomycin-polymyxin b-dexamethasone (MAXITROL) 3.5-10000-0.1 OINT      ondansetron (ZOFRAN ODT) 4 MG disintegrating tablet Take 1 tablet (4 mg total) by mouth every 8 (eight) hours as  needed for nausea or vomiting. 30 tablet 0   VYZULTA 0.024 % SOLN INSTILL 1 DROP INTO EACH EYE ONCE DAILY AT BEDTIME     No current facility-administered medications on file prior to visit.     Review of Systems  Constitutional:  Negative for fever.  HENT:  Positive for mouth sores and sore throat (sometimes - related to lichen planus). Negative for congestion and sinus pain.        Bitter sinus drainage  Respiratory:  Negative for cough, shortness of breath and wheezing.   Cardiovascular:  Negative for chest pain, palpitations and leg swelling.  Musculoskeletal:  Positive for arthralgias.  Neurological:  Positive for headaches (occ). Negative for dizziness and light-headedness.       Objective:   Vitals:   04/04/22 1046  BP: 126/74  Pulse: 60  SpO2: 98%   BP Readings from Last 3 Encounters:  04/04/22 126/74  04/04/22 126/74  04/02/22 137/83   Wt Readings from Last 3 Encounters:  04/04/22 189 lb (85.7 kg)  04/04/22 189 lb (85.7 kg)  12/26/21 193 lb (87.5 kg)   Body mass index is 34.57 kg/m.    Physical Exam Constitutional:      General: She is not in acute distress.    Appearance: Normal appearance.  HENT:  Head: Normocephalic and atraumatic.     Mouth/Throat:     Comments: Several ulcers in mouth Eyes:     Conjunctiva/sclera: Conjunctivae normal.  Cardiovascular:     Rate and Rhythm: Normal rate and regular rhythm.     Heart sounds: Normal heart sounds.  Pulmonary:     Effort: Pulmonary effort is normal. No respiratory distress.     Breath sounds: Normal breath sounds. No wheezing.  Musculoskeletal:     Cervical back: Neck supple.     Right lower leg: No edema.     Left lower leg: No edema.  Lymphadenopathy:     Cervical: No cervical adenopathy.  Skin:    General: Skin is warm and dry.     Findings: No rash.  Neurological:     Mental Status: She is alert. Mental status is at baseline.  Psychiatric:        Mood and Affect: Mood normal.         Behavior: Behavior normal.        Lab Results  Component Value Date   WBC 4.6 03/29/2021   HGB 13.0 03/29/2021   HCT 36.5 03/29/2021   PLT 294.0 03/29/2021   GLUCOSE 126 (H) 08/30/2021   CHOL 85 (L) 06/07/2021   TRIG 106 06/07/2021   HDL 37 (L) 06/07/2021   LDLDIRECT 171.8 03/21/2011   LDLCALC 28 06/07/2021   ALT 26 06/07/2021   AST 32 06/07/2021   NA 132 (L) 08/30/2021   K 4.1 08/30/2021   CL 99 08/30/2021   CREATININE 0.61 08/30/2021   BUN 12 08/30/2021   CO2 27 08/30/2021   TSH 0.94 08/30/2021   HGBA1C 5.9 (A) 10/04/2021   HGBA1C 5.9 10/04/2021   HGBA1C 5.9 10/04/2021   HGBA1C 5.9 10/04/2021     Assessment & Plan:    See Problem List for Assessment and Plan of chronic medical problems.

## 2022-04-04 ENCOUNTER — Ambulatory Visit (INDEPENDENT_AMBULATORY_CARE_PROVIDER_SITE_OTHER): Payer: Medicare Other | Admitting: Internal Medicine

## 2022-04-04 ENCOUNTER — Ambulatory Visit (INDEPENDENT_AMBULATORY_CARE_PROVIDER_SITE_OTHER): Payer: Medicare Other

## 2022-04-04 VITALS — BP 126/74 | HR 60 | Ht 62.0 in | Wt 189.0 lb

## 2022-04-04 VITALS — BP 126/74 | HR 52 | Ht 62.0 in | Wt 189.0 lb

## 2022-04-04 DIAGNOSIS — H52203 Unspecified astigmatism, bilateral: Secondary | ICD-10-CM | POA: Diagnosis not present

## 2022-04-04 DIAGNOSIS — E785 Hyperlipidemia, unspecified: Secondary | ICD-10-CM

## 2022-04-04 DIAGNOSIS — G8929 Other chronic pain: Secondary | ICD-10-CM

## 2022-04-04 DIAGNOSIS — R7303 Prediabetes: Secondary | ICD-10-CM | POA: Diagnosis not present

## 2022-04-04 DIAGNOSIS — I10 Essential (primary) hypertension: Secondary | ICD-10-CM | POA: Diagnosis not present

## 2022-04-04 DIAGNOSIS — R931 Abnormal findings on diagnostic imaging of heart and coronary circulation: Secondary | ICD-10-CM | POA: Diagnosis not present

## 2022-04-04 DIAGNOSIS — L439 Lichen planus, unspecified: Secondary | ICD-10-CM | POA: Diagnosis not present

## 2022-04-04 DIAGNOSIS — H401131 Primary open-angle glaucoma, bilateral, mild stage: Secondary | ICD-10-CM | POA: Diagnosis not present

## 2022-04-04 DIAGNOSIS — E232 Diabetes insipidus: Secondary | ICD-10-CM | POA: Diagnosis not present

## 2022-04-04 DIAGNOSIS — Z0000 Encounter for general adult medical examination without abnormal findings: Secondary | ICD-10-CM

## 2022-04-04 DIAGNOSIS — D485 Neoplasm of uncertain behavior of skin: Secondary | ICD-10-CM | POA: Diagnosis not present

## 2022-04-04 DIAGNOSIS — M25561 Pain in right knee: Secondary | ICD-10-CM

## 2022-04-04 LAB — CBC WITH DIFFERENTIAL/PLATELET
Basophils Absolute: 0.1 10*3/uL (ref 0.0–0.1)
Basophils Relative: 1.2 % (ref 0.0–3.0)
Eosinophils Absolute: 0.1 10*3/uL (ref 0.0–0.7)
Eosinophils Relative: 2.4 % (ref 0.0–5.0)
HCT: 36.8 % (ref 36.0–46.0)
Hemoglobin: 12.8 g/dL (ref 12.0–15.0)
Lymphocytes Relative: 20.8 % (ref 12.0–46.0)
Lymphs Abs: 1.1 10*3/uL (ref 0.7–4.0)
MCHC: 34.7 g/dL (ref 30.0–36.0)
MCV: 91.6 fl (ref 78.0–100.0)
Monocytes Absolute: 0.5 10*3/uL (ref 0.1–1.0)
Monocytes Relative: 9.3 % (ref 3.0–12.0)
Neutro Abs: 3.6 10*3/uL (ref 1.4–7.7)
Neutrophils Relative %: 66.3 % (ref 43.0–77.0)
Platelets: 252 10*3/uL (ref 150.0–400.0)
RBC: 4.02 Mil/uL (ref 3.87–5.11)
RDW: 13.1 % (ref 11.5–15.5)
WBC: 5.4 10*3/uL (ref 4.0–10.5)

## 2022-04-04 LAB — COMPREHENSIVE METABOLIC PANEL
ALT: 23 U/L (ref 0–35)
AST: 25 U/L (ref 0–37)
Albumin: 4.5 g/dL (ref 3.5–5.2)
Alkaline Phosphatase: 91 U/L (ref 39–117)
BUN: 14 mg/dL (ref 6–23)
CO2: 29 mEq/L (ref 19–32)
Calcium: 9.6 mg/dL (ref 8.4–10.5)
Chloride: 102 mEq/L (ref 96–112)
Creatinine, Ser: 0.64 mg/dL (ref 0.40–1.20)
GFR: 88.95 mL/min (ref >60.00)
Glucose, Bld: 108 mg/dL — ABNORMAL HIGH (ref 70–99)
Potassium: 4.3 mEq/L (ref 3.5–5.1)
Sodium: 138 mEq/L (ref 135–145)
Total Bilirubin: 0.9 mg/dL (ref 0.2–1.2)
Total Protein: 7.3 g/dL (ref 6.0–8.3)

## 2022-04-04 LAB — LIPID PANEL
Cholesterol: 127 mg/dL (ref 0–200)
HDL: 44.1 mg/dL (ref >39.00)
LDL Cholesterol: 60 mg/dL (ref 0–99)
NonHDL: 82.49
Total CHOL/HDL Ratio: 3
Triglycerides: 114 mg/dL (ref 0.0–149.0)
VLDL: 22.8 mg/dL (ref 0.0–40.0)

## 2022-04-04 LAB — TSH: TSH: 0.94 u[IU]/mL (ref 0.35–5.50)

## 2022-04-04 LAB — HEMOGLOBIN A1C: Hgb A1c MFr Bld: 6.1 % (ref 4.6–6.5)

## 2022-04-04 MED ORDER — DESMOPRESSIN ACETATE SPRAY 0.01 % NA SOLN: NASAL | 13 refills | Status: DC

## 2022-04-04 NOTE — Progress Notes (Addendum)
Subjective:   BENTLEIGH FOGLE is a 72 y.o. female who presents for Medicare Annual (Subsequent) preventive examination.  Review of Systems     Cardiac Risk Factors include: advanced age (>73men, >52 women);dyslipidemia;family history of premature cardiovascular disease;hypertension;obesity (BMI >30kg/m2)     Objective:    Today's Vitals   04/04/22 1123 04/04/22 1124  BP: 126/74   Pulse: (!) 52   SpO2: 98%   Weight: 189 lb (85.7 kg)   Height: 5\' 2"  (1.575 m)   PainSc: 0-No pain 0-No pain   Body mass index is 34.57 kg/m.     04/04/2022   11:27 AM 03/27/2021    9:55 AM 03/09/2020   10:59 AM 08/10/2016   12:12 PM 05/30/2015    3:37 PM 08/13/2014    3:06 PM 05/20/2014    2:28 PM  Advanced Directives  Does Patient Have a Medical Advance Directive? Yes Yes Yes No No No No  Type of Paramedic of [place];Living will [place];Living will Living will;Healthcare Power of Attorney      Does patient want to make changes to medical advance directive?   No - Patient declined      Copy of [place] in Chart? No - copy requested No - copy requested No - copy requested      Would patient like information on creating a medical advance directive?      Yes - Educational materials given     Current Medications (verified) Outpatient Encounter Medications as of 04/04/2022  Medication Sig   aspirin EC 81 MG tablet Take 1 tablet (81 mg total) by mouth daily. Swallow whole.   atorvastatin (LIPITOR) 80 MG tablet TAKE 1 TABLET DAILY   Calcium Carb-Cholecalciferol (CALTRATE 600+D3) 600-20 MG-MCG TABS Take 1 tablet by mouth daily.   carvedilol (COREG) 6.25 MG tablet Take 1 tablet (6.25 mg total) by mouth 2 (two) times daily.   Cholecalciferol (VITAMIN D3) 25 MCG (1000 UT) capsule Take 2 capsules by mouth daily.   dorzolamide (TRUSOPT) 2 % ophthalmic solution Place 1 drop into both eyes 2 (two) times daily.   inclisiran (LEQVIO) 284 MG/1.5ML SOSY  injection Inject 284 mg into the skin every 6 (six) months.   lisinopril (ZESTRIL) 40 MG tablet TAKE 1 TABLET DAILY (Patient taking differently: Take 40 mg by mouth daily. 1/2 table two times daily)   neomycin-polymyxin b-dexamethasone (MAXITROL) 3.5-10000-0.1 OINT    ondansetron (ZOFRAN ODT) 4 MG disintegrating tablet Take 1 tablet (4 mg total) by mouth every 8 (eight) hours as needed for nausea or vomiting.   VYZULTA 0.024 % SOLN INSTILL 1 DROP INTO EACH EYE ONCE DAILY AT BEDTIME   No facility-administered encounter medications on file as of 04/04/2022.    Allergies (verified) Patient has no known allergies.   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[place])    takes Desmopressin bid   Diarrhea    Dizziness    when nauseated gets dizzy   Family history of ovarian cancer    Hemorrhoids    History of bronchitis    yrs ago   History of migraine    last time many yrs ago   Hx of radiation therapy 01/29/13- 03/16/13   left breast 4500 cGy  25 sessions, left breast boost 1000 cGy 5 sessions   HYPERLIPIDEMIA 09/06/2006   takes Atorvasatin nightly   HYPERTENSION 09/06/2006   takes Lisinopril daily   Muscle spasms of head and/or neck    OSTEOPOROSIS 09/24/2008   takes Actonel every 30days   Vitamin D deficiency    takes OTC Vit D   Past Surgical History:  Procedure Laterality Date   BREAST BIOPSY Left 12yrs ago   BREAST LUMPECTOMY WITH NEEDLE LOCALIZATION Left 12/16/2012   Procedure: PARTIAL MASTECTOMY WITH NEEDLE LOCALIZATION;  Surgeon: Stark Klein, MD;  Location: [facility];  Service: General;  Laterality: Left;  1:00 NL at [facility]     Family History  Problem Relation Age of Onset   Ovarian cancer Mother    Cancer Mother        Ovarian   Heart disease Mother        Before age 24    Heart attack Mother    Varicose Veins Mother    Heart disease Father        Before age 86   Hyperlipidemia Father    Hypertension Father    Heart attack Father    Heart disease Sister        Before age 50   Hypertension Sister    Heart attack Sister 60       second heart attack at 41   Heart disease Brother        Before age 34   Hypertension Brother    Heart attack Brother 83   Cancer Maternal Aunt        GYN cancer   Heart attack Maternal Grandmother    Alcohol abuse Maternal Grandfather    Parkinson's disease Sister    ALS Brother    Social History   Socioeconomic History   Marital status: Widowed    Spouse name: Not on file   Number of children: Not on file   Years of education: Not on file   Highest education level: Doctorate  Occupational History   Occupation: Professor at [employer]   Smoking status: Former    Packs/day: 0.50    Years: 4.00    Additional pack years: 0.00    Total pack years: 2.00    Types: Cigarettes    Quit date: 01/07/1973    Years since quitting: 49.2   Smokeless tobacco: Never   Tobacco comments:    quit 26yrs ago  Substance and Sexual Activity   Alcohol use: Yes    Alcohol/week: 0.0 standard drinks of alcohol    Comment: socailly   Drug use: No   Sexual activity: Not Currently    Birth control/protection: Post-menopausal    Comment: menarche age 75, first live birth age 26, menopause 2002, no HRT  Other Topics Concern   Not on file  Social History Narrative   Not on file   Social Determinants of Health   Financial Resource Strain: Low Risk  (04/04/2022)   Overall Financial Resource Strain (CARDIA)    Difficulty of Paying Living Expenses: Not hard at all  Food Insecurity: No Food Insecurity (04/04/2022)   Hunger Vital Sign    Worried About Running Out of Food in the Last Year: Never true    Ran Out of Food in the Last Year: Never true  Transportation Needs: No Transportation Needs (04/04/2022)   PRAPARE -  Hydrologist (Medical): No    Lack of Transportation (Non-Medical):  No  Physical Activity: Insufficiently Active (04/04/2022)   Exercise Vital Sign    Days of Exercise per Week: 3 days    Minutes of Exercise per Session: 30 min  Stress: Stress Concern Present (04/04/2022)   [place]    Feeling of Stress : To some extent  Social Connections: Moderately Isolated (04/04/2022)   Social Connection and Isolation Panel [NHANES]    Frequency of Communication with Friends and Family: Twice a week    Frequency of Social Gatherings with Friends and Family: Once a week    Attends Religious Services: Never    Marine scientist or Organizations: Yes    Attends Music therapist: More than 4 times per year    Marital Status: Widowed    Tobacco Counseling Counseling given: Not Answered Tobacco comments: quit 75yrs ago   Clinical Intake:  Pre-visit preparation completed: Yes  Pain : No/denies pain Pain Score: 0-No pain     BMI - recorded: 34.57 Nutritional Status: BMI > 30  Obese Nutritional Risks: None Diabetes: No  How often do you need to have someone help you when you read instructions, pamphlets, or other written materials from your doctor or pharmacy?: 1 - Never What is the last grade level you completed in school?: Professor at [employer]  Diabetic? No  Interpreter Needed?: No  Information entered by :: Lisette Abu, LPN.   Activities of Daily Living    04/04/2022   11:28 AM 03/31/2022    6:15 PM  In your present state of health, do you have any difficulty performing the following activities:  Hearing? 0 0  Vision? 0 0  Difficulty concentrating or making decisions? 0 0  Walking or climbing stairs? 0 0  Dressing or bathing? 0 0  Doing errands, shopping? 0 0  Preparing Food and eating ? N N  Using the Toilet? N N  In the past six months, have you accidently  leaked urine? N N  Do you have problems with loss of bowel control? N N  Managing your Medications? N N  Managing your Finances? N N  Housekeeping or managing your Housekeeping? N N    Patient Care Team: Binnie Rail, MD as PCP - General (Internal Medicine) Donato Heinz, MD as PCP - Cardiology (Cardiology) Luberta Mutter, MD as Consulting Physician (Ophthalmology)  Indicate any recent Medical Services you may have received from other than [facility] providers in the past year (date may be approximate).     Assessment:   This is a routine wellness examination for [place].  Hearing/Vision screen Hearing Screening - Comments:: Denies hearing difficulties   Vision Screening - Comments:: Wears rx glasses - up to date with routine eye exams with Luberta Mutter, MD.   Dietary issues and exercise activities discussed: Current Exercise Habits: Home exercise routine;Structured exercise class, Type of exercise: yoga;walking;treadmill;stretching;strength training/weights;exercise ball, Time (Minutes): 30, Frequency (Times/Week): 3, Weekly Exercise (Minutes/Week): 90, Intensity: Moderate, Exercise limited by: None identified   Goals Addressed             This Visit's Progress    My goal for 2024 is to continue to stay physically active in the gym.        Depression Screen    04/04/2022   10:58 AM 10/04/2021   11:34 AM 03/27/2021    9:56 AM 03/27/2021    9:54 AM 03/09/2020   10:57 AM 03/24/2018   11:02 AM 07/17/2017  8:42 AM  PHQ 2/9 Scores  PHQ - 2 Score 2 2 0 0 0 0 0  PHQ- 9 Score 5 4         Fall Risk    04/04/2022   11:28 AM 04/04/2022   10:56 AM 03/31/2022    6:15 PM 10/04/2021   11:34 AM 03/27/2021    9:56 AM  Fall Risk   Falls in the past year? 0 0 0 0 0  Number falls in past yr: 0 0 0 0 0  Injury with Fall? 0 0 0 0 0  Risk for fall due to : No Fall Risks No Fall Risks  No Fall Risks   Follow up Falls prevention discussed Falls evaluation completed  Falls  evaluation completed Falls evaluation completed    FALL RISK PREVENTION PERTAINING TO THE HOME:  Any stairs in or around the home? No  If so, are there any without handrails? No  Home free of loose throw rugs in walkways, pet beds, electrical cords, etc? Yes  Adequate lighting in your home to reduce risk of falls? Yes   ASSISTIVE DEVICES UTILIZED TO PREVENT FALLS:  Life alert? No  Use of a cane, walker or w/c? No  Grab bars in the bathroom? No  Shower chair or bench in shower? No  Elevated toilet seat or a handicapped toilet? No   TIMED UP AND GO:  Was the test performed? Yes .  Length of time to ambulate 10 feet: 8 sec.   Gait steady and fast without use of assistive device  Cognitive Function:        04/04/2022   11:29 AM  6CIT Screen  What Year? 0 points  What month? 0 points  What time? 0 points  Count back from 20 0 points  Months in reverse 0 points  Repeat phrase 0 points  Total Score 0 points    Immunizations Immunization History  Administered Date(s) Administered   Fluad Quad(high Dose 65+) 09/10/2018, 09/28/2019, 09/28/2020, 12/04/2021   Influenza Split 09/25/2010, 11/16/2011   Influenza Whole 09/26/2009   Influenza, High Dose Seasonal PF 01/15/2017   Influenza,inj,Quad PF,6+ Mos 11/09/2015   Influenza,inj,quad, With Preservative 10/08/2017   PFIZER(Purple Top)SARS-COV-2 Vaccination 02/15/2019, 03/11/2019, 10/19/2019   Pneumococcal Conjugate-13 07/17/2017   Pneumococcal Polysaccharide-23 09/10/2018   Td 09/24/2008    TDAP status: Due, Education has been provided regarding the importance of this vaccine. Advised may receive this vaccine at local pharmacy or Health Dept. Aware to provide a copy of the vaccination record if obtained from local pharmacy or Health Dept. Verbalized acceptance and understanding.  Flu Vaccine status: Up to date  Pneumococcal vaccine status: Up to date  Covid-19 vaccine status: Completed vaccines  Qualifies for Shingles  Vaccine? Yes   Zostavax completed No   Shingrix Completed?: No.    Education has been provided regarding the importance of this vaccine. Patient has been advised to call insurance company to determine out of pocket expense if they have not yet received this vaccine. Advised may also receive vaccine at local pharmacy or Health Dept. Verbalized acceptance and understanding.  Screening Tests Health Maintenance  Topic Date Due   Zoster Vaccines- Shingrix (1 of 2) Never done   DTaP/Tdap/Td (2 - Tdap) 09/25/2018   COVID-19 Vaccine (4 - 2023-24 season) 09/08/2021   MAMMOGRAM  11/04/2022   COLONOSCOPY (Pts 45-39yrs Insurance coverage will need to be confirmed)  03/07/2023   Medicare Annual Wellness (AWV)  04/04/2023   DEXA SCAN  12/14/2023  Pneumonia Vaccine 66+ Years old  Completed   INFLUENZA VACCINE  Completed   Hepatitis C Screening  Completed   HPV VACCINES  Aged Out    Health Maintenance  Health Maintenance Due  Topic Date Due   Zoster Vaccines- Shingrix (1 of 2) Never done   DTaP/Tdap/Td (2 - Tdap) 09/25/2018   COVID-19 Vaccine (4 - 2023-24 season) 09/08/2021    Colorectal cancer screening: Type of screening: Colonoscopy. Completed 03/06/2013. Repeat every 10 years  Mammogram status: Completed 11/03/2021. Repeat every year  Bone Density status: Completed 12/29/2021. Results reflect: Bone density results: OSTEOPENIA. Repeat every 2 years.  Lung Cancer Screening: (Low Dose CT Chest recommended if Age 84-80 years, 30 pack-year currently smoking OR have quit w/in 15years.) does not qualify.   Lung Cancer Screening Referral: no  Additional Screening:  Hepatitis C Screening: does qualify; Completed 07/17/2017  Vision Screening: Recommended annual ophthalmology exams for early detection of glaucoma and other disorders of the eye. Is the patient up to date with their annual eye exam?  Yes  Who is the provider or what is the name of the office in which the patient attends annual eye  exams? Luberta Mutter, MD. If pt is not established with a provider, would they like to be referred to a provider to establish care? No .   Dental Screening: Recommended annual dental exams for proper oral hygiene  Community Resource Referral / Chronic Care Management: CRR required this visit?  No   CCM required this visit?  No      Plan:     I have personally reviewed and noted the following in the patient's chart:   Medical and social history Use of alcohol, tobacco or illicit drugs  Current medications and supplements including opioid prescriptions. Patient is not currently taking opioid prescriptions. Functional ability and status Nutritional status Physical activity Advanced directives List of other physicians Hospitalizations, surgeries, and ER visits in previous 12 months Vitals Screenings to include cognitive, depression, and falls Referrals and appointments  In addition, I have reviewed and discussed with patient certain preventive protocols, quality metrics, and best practice recommendations. A written personalized care plan for preventive services as well as general preventive health recommendations were provided to patient.     Sheral Flow, LPN   [id number]   Nurse Notes:  Normal cognitive status assessed by direct observation by this Nurse Health Advisor. No abnormalities found.   Patient Medicare AWV questionnaire was completed by the patient on 03/31/2022; I have confirmed that all information answered by patient is correct and no changes since this date.

## 2022-04-04 NOTE — Assessment & Plan Note (Signed)
Chronic High coronary calcium score 1202 Continue atorvastatin 80 mg daily, coreg 6.25 mg bid, ASA 81 mg daily Injectable cholesterol medication- Leqvio Has seen cardio

## 2022-04-04 NOTE — Assessment & Plan Note (Signed)
Chronic Oral flare for the past 6 weeks or so-severe in nature Rinsing with hydrogen peroxide and salt and using a pH balanced mouthwash Has not seen much improvement Has significant pain, difficulty eating Stressed that she needs to contact the oral surgeon that diagnosed her or see someone that specializes in this area ?  Benefit from oral steroid taper or other immunosuppressant that may help Stressed that she is more proactive with getting help for this

## 2022-04-04 NOTE — Assessment & Plan Note (Signed)
Chronic Check lipids, cmp Continue atorvastatin 80 mg daily, also on Leqvio

## 2022-04-04 NOTE — Assessment & Plan Note (Addendum)
Chronic Management per Endo On desmopressin once a day

## 2022-04-04 NOTE — Assessment & Plan Note (Signed)
Chronic Check a1c Low sugar / carb diet Stressed regular exercise  

## 2022-04-04 NOTE — Assessment & Plan Note (Addendum)
Chronic Blood pressure well controlled Continue lisinopril 40 mg daily, coreg 6.25 mg bid CMP, CBC, tsh

## 2022-04-04 NOTE — Assessment & Plan Note (Signed)
Chronic Likely osteoarthritis Likely affecting her right hip pain and leg pain Discussed treatment options for the arthritis depending on how severe it is-can see orthopedics when she is ready

## 2022-04-04 NOTE — Patient Instructions (Signed)
Ms. Chelsea Moreno , Thank you for taking time to come for your Medicare Wellness Visit. I appreciate your ongoing commitment to your health goals. Please review the following plan we discussed and let me know if I can assist you in the future.   These are the goals we discussed:  Goals      My goal for 2024 is to continue to stay physically active in the gym.        This is a list of the screening recommended for you and due dates:  Health Maintenance  Topic Date Due   Zoster (Shingles) Vaccine (1 of 2) Never done   DTaP/Tdap/Td vaccine (2 - Tdap) 09/25/2018   COVID-19 Vaccine (4 - 2023-24 season) 09/08/2021   Mammogram  11/04/2022   Colon Cancer Screening  03/07/2023   Medicare Annual Wellness Visit  04/04/2023   DEXA scan (bone density measurement)  12/14/2023   Pneumonia Vaccine  Completed   Flu Shot  Completed   Hepatitis C Screening: USPSTF Recommendation to screen - Ages 18-79 yo.  Completed   HPV Vaccine  Aged Out    Advanced directives: Yes  Conditions/risks identified: Yes  Next appointment: Follow up in one year for your annual wellness visit.   Preventive Care 33 Years and Older, Female Preventive care refers to lifestyle choices and visits with your health care provider that can promote health and wellness. What does preventive care include? A yearly physical exam. This is also called an annual well check. Dental exams once or twice a year. Routine eye exams. Ask your health care provider how often you should have your eyes checked. Personal lifestyle choices, including: Daily care of your teeth and gums. Regular physical activity. Eating a healthy diet. Avoiding tobacco and drug use. Limiting alcohol use. Practicing safe sex. Taking low-dose aspirin every day. Taking vitamin and mineral supplements as recommended by your health care provider. What happens during an annual well check? The services and screenings done by your health care provider during your  annual well check will depend on your age, overall health, lifestyle risk factors, and family history of disease. Counseling  Your health care provider may ask you questions about your: Alcohol use. Tobacco use. Drug use. Emotional well-being. Home and relationship well-being. Sexual activity. Eating habits. History of falls. Memory and ability to understand (cognition). Work and work Statistician. Reproductive health. Screening  You may have the following tests or measurements: Height, weight, and BMI. Blood pressure. Lipid and cholesterol levels. These may be checked every 5 years, or more frequently if you are over 44 years old. Skin check. Lung cancer screening. You may have this screening every year starting at age 4 if you have a 30-pack-year history of smoking and currently smoke or have quit within the past 15 years. Fecal occult blood test (FOBT) of the stool. You may have this test every year starting at age 69. Flexible sigmoidoscopy or colonoscopy. You may have a sigmoidoscopy every 5 years or a colonoscopy every 10 years starting at age 6. Hepatitis C blood test. Hepatitis B blood test. Sexually transmitted disease (STD) testing. Diabetes screening. This is done by checking your blood sugar (glucose) after you have not eaten for a while (fasting). You may have this done every 1-3 years. Bone density scan. This is done to screen for osteoporosis. You may have this done starting at age 99. Mammogram. This may be done every 1-2 years. Talk to your health care provider about how often you should have  regular mammograms. Talk with your health care provider about your test results, treatment options, and if necessary, the need for more tests. Vaccines  Your health care provider may recommend certain vaccines, such as: Influenza vaccine. This is recommended every year. Tetanus, diphtheria, and acellular pertussis (Tdap, Td) vaccine. You may need a Td booster every 10  years. Zoster vaccine. You may need this after age 33. Pneumococcal 13-valent conjugate (PCV13) vaccine. One dose is recommended after age 76. Pneumococcal polysaccharide (PPSV23) vaccine. One dose is recommended after age 11. Talk to your health care provider about which screenings and vaccines you need and how often you need them. This information is not intended to replace advice given to you by your health care provider. Make sure you discuss any questions you have with your health care provider. Document Released: 01/21/2015 Document Revised: 09/14/2015 Document Reviewed: 10/26/2014 Elsevier Interactive Patient Education  2017 [place] Prevention in the Home Falls can cause injuries. They can happen to people of all ages. There are many things you can do to make your home safe and to help prevent falls. What can I do on the outside of my home? Regularly fix the edges of walkways and driveways and fix any cracks. Remove anything that might make you trip as you walk through a door, such as a raised step or threshold. Trim any bushes or trees on the path to your home. Use bright outdoor lighting. Clear any walking paths of anything that might make someone trip, such as rocks or tools. Regularly check to see if handrails are loose or broken. Make sure that both sides of any steps have handrails. Any raised decks and porches should have guardrails on the edges. Have any leaves, snow, or ice cleared regularly. Use sand or salt on walking paths during winter. Clean up any spills in your garage right away. This includes oil or grease spills. What can I do in the bathroom? Use night lights. Install grab bars by the toilet and in the tub and shower. Do not use towel bars as grab bars. Use non-skid mats or decals in the tub or shower. If you need to sit down in the shower, use a plastic, non-slip stool. Keep the floor dry. Clean up any water that spills on the floor as soon as it  happens. Remove soap buildup in the tub or shower regularly. Attach bath mats securely with double-sided non-slip rug tape. Do not have throw rugs and other things on the floor that can make you trip. What can I do in the bedroom? Use night lights. Make sure that you have a light by your bed that is easy to reach. Do not use any sheets or blankets that are too big for your bed. They should not hang down onto the floor. Have a firm chair that has side arms. You can use this for support while you get dressed. Do not have throw rugs and other things on the floor that can make you trip. What can I do in the kitchen? Clean up any spills right away. Avoid walking on wet floors. Keep items that you use a lot in easy-to-reach places. If you need to reach something above you, use a strong step stool that has a grab bar. Keep electrical cords out of the way. Do not use floor polish or wax that makes floors slippery. If you must use wax, use non-skid floor wax. Do not have throw rugs and other things on the floor that  can make you trip. What can I do with my stairs? Do not leave any items on the stairs. Make sure that there are handrails on both sides of the stairs and use them. Fix handrails that are broken or loose. Make sure that handrails are as long as the stairways. Check any carpeting to make sure that it is firmly attached to the stairs. Fix any carpet that is loose or worn. Avoid having throw rugs at the top or bottom of the stairs. If you do have throw rugs, attach them to the floor with carpet tape. Make sure that you have a light switch at the top of the stairs and the bottom of the stairs. If you do not have them, ask someone to add them for you. What else can I do to help prevent falls? Wear shoes that: Do not have high heels. Have rubber bottoms. Are comfortable and fit you well. Are closed at the toe. Do not wear sandals. If you use a stepladder: Make sure that it is fully opened.  Do not climb a closed stepladder. Make sure that both sides of the stepladder are locked into place. Ask someone to hold it for you, if possible. Clearly mark and make sure that you can see: Any grab bars or handrails. First and last steps. Where the edge of each step is. Use tools that help you move around (mobility aids) if they are needed. These include: Canes. Walkers. Scooters. Crutches. Turn on the lights when you go into a dark area. Replace any light bulbs as soon as they burn out. Set up your furniture so you have a clear path. Avoid moving your furniture around. If any of your floors are uneven, fix them. If there are any pets around you, be aware of where they are. Review your medicines with your doctor. Some medicines can make you feel dizzy. This can increase your chance of falling. Ask your doctor what other things that you can do to help prevent falls. This information is not intended to replace advice given to you by your health care provider. Make sure you discuss any questions you have with your health care provider. Document Released: 10/21/2008 Document Revised: 06/02/2015 Document Reviewed: 01/29/2014 Elsevier Interactive Patient Education  2017 [employer].

## 2022-04-10 LAB — LIPOPROTEIN A (LPA): Lipoprotein (a): 270 nmol/L — ABNORMAL HIGH (ref <75)

## 2022-04-19 ENCOUNTER — Encounter: Payer: Self-pay | Admitting: Internal Medicine

## 2022-04-19 DIAGNOSIS — L439 Lichen planus, unspecified: Secondary | ICD-10-CM

## 2022-04-19 NOTE — Addendum Note (Signed)
Addended by: Pincus Sanes on: 04/19/2022 08:40 PM   Modules accepted: Orders

## 2022-05-21 DIAGNOSIS — H25812 Combined forms of age-related cataract, left eye: Secondary | ICD-10-CM | POA: Diagnosis not present

## 2022-05-21 DIAGNOSIS — H2512 Age-related nuclear cataract, left eye: Secondary | ICD-10-CM | POA: Diagnosis not present

## 2022-05-21 DIAGNOSIS — Z961 Presence of intraocular lens: Secondary | ICD-10-CM | POA: Diagnosis not present

## 2022-05-23 ENCOUNTER — Other Ambulatory Visit: Payer: Self-pay | Admitting: Pharmacist

## 2022-05-23 ENCOUNTER — Telehealth: Payer: Self-pay | Admitting: Pharmacist

## 2022-05-23 NOTE — Telephone Encounter (Signed)
Patient made aware of the change from the hosptial to [facility]. I will cancel her apt with [facility]. She is aware someone from [place] will call her to make apt. She has Tricare for life. I know her PA at one pt was approved but can't find documentation of when it was approved through.

## 2022-05-23 NOTE — Telephone Encounter (Signed)
Patient needs to be transitioned to [facility] st infusion center. Due for injection 5/28. Referral entered. Her apt with [facility] needs to be canceled and someone from [facility] will contact her to schedule her at [facility].

## 2022-05-24 NOTE — Telephone Encounter (Signed)
Auth Submission: NO AUTH NEEDED Site of care: Site of care: [facility] Payer: medicare a/b & tricare Medication & CPT/J Code(s) submitted: Leqvio (Inclisiran) [id number] Route of submission (phone, fax, portal):  Phone # Fax # Auth type: Buy/Bill Units/visits requested: 2 Reference number:  Approval from: 05/24/22 to 01/08/23   I have verified benefits: medicare will cover 80% and tricare will pick-up remaining 20%. Patient has met $240 medicare deductible. Leqvio will be covered 100%.  Patient will be scheduled as soon as possible

## 2022-05-24 NOTE — Telephone Encounter (Signed)
The enrollment form says no PA required. But this was over a year ago.  : The patient has active insurance through Medicare Part B, which will cover LEQVIO at 80% after the patient meets the $226.00 calendar year deductible. Once the deductible is met, the patient will be responsible for a 20% coinsurance. As of 02/14/2021, the patient has met $0.00 towards their deductible. No prior authorization is required. Provider must Arnoldo Morale and Annette Stable or refer to an Alternate Injection Center [facility]). For [facility] treatment site options, please refer to [email or web address]. Specialty Pharmacy acquisition is not available through Medicare Part B

## 2022-05-25 ENCOUNTER — Other Ambulatory Visit: Payer: Self-pay | Admitting: Internal Medicine

## 2022-06-01 ENCOUNTER — Ambulatory Visit: Payer: Medicare Other

## 2022-06-05 ENCOUNTER — Ambulatory Visit (INDEPENDENT_AMBULATORY_CARE_PROVIDER_SITE_OTHER): Payer: Medicare Other

## 2022-06-05 ENCOUNTER — Encounter (HOSPITAL_COMMUNITY): Payer: Medicare Other

## 2022-06-05 VITALS — BP 148/79 | HR 63 | Temp 98.6°F | Resp 16 | Ht 64.0 in | Wt 189.2 lb

## 2022-06-05 DIAGNOSIS — E785 Hyperlipidemia, unspecified: Secondary | ICD-10-CM | POA: Diagnosis not present

## 2022-06-05 DIAGNOSIS — R931 Abnormal findings on diagnostic imaging of heart and coronary circulation: Secondary | ICD-10-CM

## 2022-06-05 DIAGNOSIS — I10 Essential (primary) hypertension: Secondary | ICD-10-CM

## 2022-06-05 MED ORDER — INCLISIRAN SODIUM 284 MG/1.5ML ~~LOC~~ SOSY
284.0000 mg | PREFILLED_SYRINGE | Freq: Once | SUBCUTANEOUS | Status: AC
  Administered 2022-06-05: 284 mg via SUBCUTANEOUS
  Filled 2022-06-05: qty 1.5

## 2022-06-05 NOTE — Progress Notes (Signed)
Diagnosis: Hyperlipidemia  Provider:  Chilton Greathouse MD  Procedure: Injection  Leqvio (inclisiran), Dose: 284 mg, Site: subcutaneous, Number of injections: 1  Post Care:  n/a  Discharge: Condition: Good, Destination: Home . AVS Declined  Performed by:  Loney Hering, LPN

## 2022-06-07 DIAGNOSIS — L438 Other lichen planus: Secondary | ICD-10-CM | POA: Diagnosis not present

## 2022-06-07 DIAGNOSIS — L439 Lichen planus, unspecified: Secondary | ICD-10-CM | POA: Diagnosis not present

## 2022-07-16 DIAGNOSIS — Z961 Presence of intraocular lens: Secondary | ICD-10-CM | POA: Diagnosis not present

## 2022-07-16 DIAGNOSIS — H25811 Combined forms of age-related cataract, right eye: Secondary | ICD-10-CM | POA: Diagnosis not present

## 2022-07-16 DIAGNOSIS — H2511 Age-related nuclear cataract, right eye: Secondary | ICD-10-CM | POA: Diagnosis not present

## 2022-07-23 DIAGNOSIS — L438 Other lichen planus: Secondary | ICD-10-CM | POA: Diagnosis not present

## 2022-07-26 DIAGNOSIS — Z01818 Encounter for other preprocedural examination: Secondary | ICD-10-CM | POA: Diagnosis not present

## 2022-07-26 DIAGNOSIS — H02831 Dermatochalasis of right upper eyelid: Secondary | ICD-10-CM | POA: Diagnosis not present

## 2022-07-26 DIAGNOSIS — H57813 Brow ptosis, bilateral: Secondary | ICD-10-CM | POA: Diagnosis not present

## 2022-07-26 DIAGNOSIS — D239 Other benign neoplasm of skin, unspecified: Secondary | ICD-10-CM | POA: Insufficient documentation

## 2022-07-26 DIAGNOSIS — D485 Neoplasm of uncertain behavior of skin: Secondary | ICD-10-CM | POA: Diagnosis not present

## 2022-07-26 DIAGNOSIS — H02412 Mechanical ptosis of left eyelid: Secondary | ICD-10-CM | POA: Diagnosis not present

## 2022-07-26 DIAGNOSIS — H02834 Dermatochalasis of left upper eyelid: Secondary | ICD-10-CM | POA: Diagnosis not present

## 2022-07-26 DIAGNOSIS — H409 Unspecified glaucoma: Secondary | ICD-10-CM | POA: Insufficient documentation

## 2022-07-26 DIAGNOSIS — H0279 Other degenerative disorders of eyelid and periocular area: Secondary | ICD-10-CM | POA: Diagnosis not present

## 2022-08-23 ENCOUNTER — Encounter (INDEPENDENT_AMBULATORY_CARE_PROVIDER_SITE_OTHER): Payer: Self-pay

## 2022-09-02 NOTE — Progress Notes (Unsigned)
Name: Chelsea Moreno  MRN/ DOB: [id number], April 25, 1950    Age/ Sex: 72 y.o., female    PCP: Chelsea Sanes, MD   Reason for Endocrinology Evaluation: Diabetes Insipidus (DI)     Date of Initial Endocrinology Evaluation: 04/25/2018    HPI: Chelsea Moreno is a 72 y.o. female with a past medical history of DI, HTN and Dyslipidemia. The patient presented for initial endocrinology clinic visit on 04/25/2018 for consultative assistance with her DI .     HISTORICAL SUMMARY: The patient was first diagnosed with DI  at age 5. She presented with polyuria and polydipsia to where she was not able to perform her daily activities or school activities, she was living in [place], [place] at the time, was evaluated at [facility] for ~ 2 weeks and was initially on injections, followed by a powder medicine but around 2010 she was started on Desmopressin 1 spray Q12 hrs.   The cause of DI is unclear but she believes this was attributed to her recurrent falls as a child.   She has been on Desmopressin 1 spray Q12 hrs until March, 2020 when she was noted with hyponatremia ,Na 128 mEq/L, and was asked to reduce the dose to once a day but that patient felt miserable with polydipsia, drinking ~ 2 gallons of fluid at a time and polyuria with the urge to urinate every 45 minutes. Her repeat sodium was 144 mEq/L but the patient is now using desmopressin ~ every 18 hrs, the last time she used it was ~ 8:30 pm and the time prior to that was around 12:45 AM.   Prior to all this , when she was on Q12 desmopressin dosing she tends to drink water out of habit.     She has no FH of DI     MRI of the brain showed normal pituitary 09/2021    SUBJECTIVE:    Today (08/30/2021):  Chelsea Moreno is here for a follow up on diabetes insipidus.   She has been noted with weight loss  Denies nausea, or vomiting  Has noted fatigue for the past few months  She doesn't always sleep soundly but got 6 hrs last night  Has glaucoma  , working with an opthalmologic . S/P laser eye sx x 2 Denies headaches  Denies palpitations  Had noted congestion  Has been drinking 96  oz a a day   dDAVP 0.01 % 1 spray daily and a second dose if symptomatic with polyuria and polydipsia  - she tends to  use it every 18 hours     HISTORY:  Past Medical History:  Past Medical History:  Diagnosis Date   Breast cancer (HCC) 11/05/12   left   Bursitis    COLONIC POLYPS, HX OF 09/24/2008   DEPRESSION 09/23/2007   takes Lexapro daily but hasn't taken since Sept 2014   Diabetes insipidus (HCC)    takes Desmopressin bid   Diarrhea    Dizziness    when nauseated gets dizzy   Family history of ovarian cancer    Hemorrhoids    History of bronchitis    yrs ago   History of migraine    last time many yrs ago   Hx of radiation therapy 01/29/13- 03/16/13   left breast 4500 cGy 25 sessions, left breast boost 1000 cGy 5 sessions   HYPERLIPIDEMIA 09/06/2006   takes Atorvasatin nightly   HYPERTENSION 09/06/2006   takes Lisinopril daily   Muscle  spasms of head and/or neck    OSTEOPOROSIS 09/24/2008   takes Actonel every 30days   Vitamin D deficiency    takes OTC Vit D   Past Surgical History:  Past Surgical History:  Procedure Laterality Date   BREAST BIOPSY Left 83yrs ago   BREAST LUMPECTOMY WITH NEEDLE LOCALIZATION Left 12/16/2012   Procedure: PARTIAL MASTECTOMY WITH NEEDLE LOCALIZATION;  Surgeon: Almond Lint, MD;  Location: [facility] OR;  Service: General;  Laterality: Left;  1:00 NL at [facility]    COLONOSCOPY      Social History:  reports that she quit smoking about 49 years ago. Her smoking use included cigarettes. She started smoking about 53 years ago. She has a 2 pack-year smoking history. She has never used smokeless tobacco. She reports current alcohol use. She reports that she does not use drugs. Family History: family history includes ALS in her brother; Alcohol abuse in her maternal grandfather; Cancer in her maternal aunt and mother;  Heart attack in her father, maternal grandmother, and mother; Heart attack (age of onset: 1) in her brother; Heart attack (age of onset: 31) in her sister; Heart disease in her brother, father, mother, and sister; Hyperlipidemia in her father; Hypertension in her brother, father, and sister; Ovarian cancer in her mother; Parkinson's disease in her sister; Varicose Veins in her mother.   HOME MEDICATIONS: Allergies as of 09/03/2022   No Known Allergies      Medication List        Accurate as of September 02, 2022  7:34 PM. If you have any questions, ask your nurse or doctor.          aspirin EC 81 MG tablet Take 1 tablet (81 mg total) by mouth daily. Swallow whole.   atorvastatin 80 MG tablet Commonly known as: LIPITOR TAKE 1 TABLET DAILY   Caltrate 600+D3 600-20 MG-MCG Tabs Generic drug: Calcium Carb-Cholecalciferol Take 1 tablet by mouth daily.   carvedilol 6.25 MG tablet Commonly known as: COREG Take 1 tablet (6.25 mg total) by mouth 2 (two) times daily.   desmopressin 0.01 % solution Commonly known as: DDAVP NASAL USE 1 SPRAY (10 MCG) IN EACH NOSTRIL TWICE A DAY   dorzolamide 2 % ophthalmic solution Commonly known as: TRUSOPT Place 1 drop into both eyes 2 (two) times daily.   inclisiran 284 MG/1.5ML Sosy injection Commonly known as: LEQVIO Inject 284 mg into the skin every 6 (six) months.   lisinopril 40 MG tablet Commonly known as: ZESTRIL TAKE 1 TABLET DAILY What changed: additional instructions   neomycin-polymyxin b-dexamethasone 3.5-10000-0.1 Oint Commonly known as: MAXITROL   ondansetron 4 MG disintegrating tablet Commonly known as: Zofran ODT Take 1 tablet (4 mg total) by mouth every 8 (eight) hours as needed for nausea or vomiting.   Vitamin D3 1000 units Caps Take 2 capsules by mouth daily.   Vyzulta 0.024 % Soln Generic drug: Latanoprostene Bunod INSTILL 1 DROP INTO EACH EYE ONCE DAILY AT BEDTIME            DATA REVIEWED:  Latest  Reference Range & Units 08/30/21 13:55  Sodium 135 - 145 mEq/L 132 (L)  Potassium 3.5 - 5.1 mEq/L 4.1  Chloride 96 - 112 mEq/L 99  CO2 19 - 32 mEq/L 27  Glucose 70 - 99 mg/dL 161 (H)  BUN 6 - 23 mg/dL 12  Creatinine 0.96 - 0.45 mg/dL 4.09  Calcium 8.4 - 81.1 mg/dL 9.2  GFR >91.47 mL/min 90.36    Latest Reference Range &  Units 08/30/21 13:55  Cortisol, Plasma ug/dL 7.4  FSH mIU/ML 19.1  Prolactin ng/mL 2.7  Glucose 70 - 99 mg/dL 478 (H)  TSH 2.95 - 6.21 uIU/mL 0.94  T4,Free(Direct) 0.60 - 1.60 ng/dL 3.08   MRI brain 06/12/7844  FINDINGS: Sella: Infundibulum is normal in caliber and midline. There is no sellar or suprasellar mass.   Brain: There is no acute infarction or intracranial hemorrhage. There is no intracranial mass, mass effect, or edema. There is no hydrocephalus or extra-axial fluid collection. Ventricles and sulci are normal in size and configuration. Patchy and confluent areas of T2 hyperintensity in the supratentorial in pontine white matter are nonspecific but may reflect mild to moderate chronic microvascular ischemic changes. No abnormal enhancement.   Vascular: Major vessel flow voids at the skull base are preserved.   Skull and upper cervical spine: Normal marrow signal is preserved.   Sinuses/Orbits: Paranasal sinuses are aerated. Orbits are unremarkable.   Other: Mastoid air cells are clear.   IMPRESSION: No sellar or suprasellar mass.   Mild to moderate chronic microvascular ischemic changes.         ASSESSMENT/PLAN/RECOMMENDATIONS:   Diabetes Insipidus:   - Pt  advised again to drink to thirst ONLY , to avoid hyponatremia, last year she average fluid intake of 60 oz and today 96 oz - We had discussed the consequences of hyponatremia in the past and causing seizures and coma etc, we discussed promptly getting a medical evaluation for nausea/ vomiting  -Sodium has been low this year, she has been averaging desmopressin intake Q18 hrs - She  was given a choice of spacing desmopressin to Q22 hrs, reducing fluid intake to 65 oz vs switching to oral desmopressin . She likes the nasal route due to immediate effect -No changes today  Medications : dDAVP 0.01 % 1 spray Q 22 hours.      F/u in 1 yr  Signed electronically by: Lyndle Herrlich, MD  [facility] Endocrinology  [facility] [address] [place], [place] [address] Phone: [phone] FAX: [phone]   CC: Chelsea Sanes, MD [address] [place] [address] Phone: [phone] Fax: [phone]   Return to Endocrinology clinic as below: Future Appointments  Date Time Provider Department Center  09/03/2022 11:50 AM Kimblery Diop, Konrad Dolores, MD [facility] None  10/03/2022  2:00 PM Chelsea Sanes, MD [facility] None  10/17/2022 11:00 AM Terri Piedra, DO [facility] None  12/05/2022 11:15 AM Loa Socks, NP [facility] None  12/10/2022  2:00 PM [facility] None  12/25/2022  2:00 PM Little Ishikawa, MD [facility] None

## 2022-09-03 ENCOUNTER — Ambulatory Visit (INDEPENDENT_AMBULATORY_CARE_PROVIDER_SITE_OTHER): Payer: Medicare Other | Admitting: Internal Medicine

## 2022-09-03 ENCOUNTER — Encounter: Payer: Self-pay | Admitting: Internal Medicine

## 2022-09-03 VITALS — BP 124/82 | HR 60 | Ht 64.0 in | Wt 183.0 lb

## 2022-09-03 DIAGNOSIS — E232 Diabetes insipidus: Secondary | ICD-10-CM | POA: Diagnosis not present

## 2022-09-03 MED ORDER — DESMOPRESSIN ACETATE SPRAY 0.01 % NA SOLN: NASAL | 13 refills | Status: DC

## 2022-09-13 ENCOUNTER — Other Ambulatory Visit: Payer: Self-pay | Admitting: Cardiology

## 2022-09-13 DIAGNOSIS — H02824 Cysts of left upper eyelid: Secondary | ICD-10-CM | POA: Diagnosis not present

## 2022-10-02 NOTE — Patient Instructions (Addendum)
     Flu immunization administered today.     Your A1c was checked    Medications changes include :   none     Return in about 6 months (around 04/02/2023) for follow up.

## 2022-10-02 NOTE — Progress Notes (Unsigned)
Subjective:    Patient ID: Chelsea Moreno, female    DOB: [birth year], 72 y.o.   MRN: [id number]     HPI Chelsea Moreno is here for follow up of her chronic medical problems.  Larey Seat recently - lip on sidewalk.  Bruised the whole right lower leg.  Still sore.    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  She is exercising more than she has in the past and has lost some weight.  Medications and allergies reviewed with patient and updated if appropriate.  Current Outpatient Medications on File Prior to Visit  Medication Sig Dispense Refill   aspirin EC 81 MG tablet Take 1 tablet (81 mg total) by mouth daily. Swallow whole. 30 tablet 12   atorvastatin (LIPITOR) 80 MG tablet TAKE 1 TABLET DAILY 90 tablet 3   baricitinib (OLUMIANT) tablet Take 4 mg by mouth daily.     BARICITINIB PO Take by mouth.     Calcium Carb-Cholecalciferol (CALTRATE 600+D3) 600-20 MG-MCG TABS Take 1 tablet by mouth daily.     carvedilol (COREG) 6.25 MG tablet Take 1 tablet (6.25 mg total) by mouth 2 (two) times daily. 180 tablet 3   Cholecalciferol (VITAMIN D3) 25 MCG (1000 UT) capsule Take 2 capsules by mouth daily.     desmopressin (DDAVP NASAL) 0.01 % solution USE 1 SPRAY (10 MCG) IN EACH NOSTRIL TWICE A DAY 10 mL 13   Dorzolamide HCl-Timolol Mal PF 2-0.5 % SOLN Place 1 drop into both eyes daily.     inclisiran (LEQVIO) 284 MG/1.5ML SOSY injection Inject 284 mg into the skin every 6 (six) months.     lisinopril (ZESTRIL) 40 MG tablet TAKE 1 TABLET DAILY 90 tablet 3   ondansetron (ZOFRAN ODT) 4 MG disintegrating tablet Take 1 tablet (4 mg total) by mouth every 8 (eight) hours as needed for nausea or vomiting. 30 tablet 0   VYZULTA 0.024 % SOLN INSTILL 1 DROP INTO EACH EYE ONCE DAILY AT BEDTIME     No current facility-administered medications on file prior to visit.     Review of Systems  Constitutional:   Negative for fever.  Respiratory:  Negative for cough, shortness of breath and wheezing.   Cardiovascular:  Negative for chest pain, palpitations and leg swelling.  Neurological:  Positive for light-headedness (occ - if stands up quickly). Negative for headaches.       Objective:   Vitals:   10/03/22 1349  BP: 130/78  Pulse: 78  Temp: 98.1 F (36.7 C)  SpO2: 99%   BP Readings from Last 3 Encounters:  10/03/22 130/78  09/03/22 124/82  06/05/22 (!) 148/79   Wt Readings from Last 3 Encounters:  10/03/22 184 lb (83.5 kg)  09/03/22 183 lb (83 kg)  06/05/22 189 lb 3.2 oz (85.8 kg)   Body mass index is 31.58 kg/m.    Physical Exam Constitutional:      General: She is not in acute distress.    Appearance: Normal appearance.  HENT:     Head: Normocephalic and atraumatic.  Eyes:     Conjunctiva/sclera: Conjunctivae normal.  Cardiovascular:     Rate and Rhythm: Normal rate and regular rhythm.     Heart sounds: Normal heart sounds.  Pulmonary:     Effort: Pulmonary effort is normal. No respiratory distress.     Breath sounds: Normal breath sounds.  No wheezing.  Musculoskeletal:     Cervical back: Neck supple.     Right lower leg: No edema.     Left lower leg: No edema.  Lymphadenopathy:     Cervical: No cervical adenopathy.  Skin:    General: Skin is warm and dry.     Findings: No rash.  Neurological:     Mental Status: She is alert. Mental status is at baseline.  Psychiatric:        Mood and Affect: Mood normal.        Behavior: Behavior normal.        Lab Results  Component Value Date   WBC 5.4 04/04/2022   HGB 12.8 04/04/2022   HCT 36.8 04/04/2022   PLT 252.0 04/04/2022   GLUCOSE 108 (H) 04/04/2022   CHOL 127 04/04/2022   TRIG 114.0 04/04/2022   HDL 44.10 04/04/2022   LDLDIRECT 171.8 03/21/2011   LDLCALC 60 04/04/2022   ALT 23 04/04/2022   AST 25 04/04/2022   NA 138 04/04/2022   K 4.3 04/04/2022   CL 102 04/04/2022   CREATININE 0.64 04/04/2022    BUN 14 04/04/2022   CO2 29 04/04/2022   TSH 0.94 04/04/2022   HGBA1C 6.1 04/04/2022     Assessment & Plan:    See Problem List for Assessment and Plan of chronic medical problems.

## 2022-10-03 ENCOUNTER — Ambulatory Visit: Payer: Medicare Other | Admitting: Internal Medicine

## 2022-10-03 ENCOUNTER — Encounter: Payer: Self-pay | Admitting: Internal Medicine

## 2022-10-03 VITALS — BP 130/78 | HR 78 | Temp 98.1°F | Ht 64.0 in | Wt 184.0 lb

## 2022-10-03 DIAGNOSIS — R7303 Prediabetes: Secondary | ICD-10-CM | POA: Diagnosis not present

## 2022-10-03 DIAGNOSIS — E785 Hyperlipidemia, unspecified: Secondary | ICD-10-CM | POA: Diagnosis not present

## 2022-10-03 DIAGNOSIS — I10 Essential (primary) hypertension: Secondary | ICD-10-CM | POA: Diagnosis not present

## 2022-10-03 DIAGNOSIS — Z23 Encounter for immunization: Secondary | ICD-10-CM

## 2022-10-03 DIAGNOSIS — L439 Lichen planus, unspecified: Secondary | ICD-10-CM | POA: Diagnosis not present

## 2022-10-03 DIAGNOSIS — E232 Diabetes insipidus: Secondary | ICD-10-CM

## 2022-10-03 LAB — POCT GLYCOSYLATED HEMOGLOBIN (HGB A1C)
HbA1c POC (<> result, manual entry): 6 % (ref 4.0–5.6)
HbA1c, POC (controlled diabetic range): 6 % (ref 0.0–7.0)
HbA1c, POC (prediabetic range): 6 % (ref 5.7–6.4)
Hemoglobin A1C: 6 % — AB (ref 4.0–5.6)

## 2022-10-03 MED ORDER — ATORVASTATIN CALCIUM 80 MG PO TABS: 80.0000 mg | ORAL_TABLET | Freq: Every day | ORAL | 3 refills | Status: DC

## 2022-10-03 NOTE — Assessment & Plan Note (Signed)
Chronic Improved Following with dermatology at [facility] and is any clinical trial currently taking baricitinib, which has significantly helped her symptoms

## 2022-10-03 NOTE — Assessment & Plan Note (Signed)
Chronic Lab Results  Component Value Date   HGBA1C 6.0 (A) 10/03/2022   HGBA1C 6.0 10/03/2022   HGBA1C 6.0 10/03/2022   HGBA1C 6.0 10/03/2022    Low sugar / carb diet Stressed regular exercise

## 2022-10-03 NOTE — Assessment & Plan Note (Signed)
Chronic Management per Endo On desmopressin once a day

## 2022-10-03 NOTE — Assessment & Plan Note (Signed)
Chronic Blood pressure well controlled Continue lisinopril 40 mg daily, coreg 6.25 mg bid Reviewed recent CMP and care everywhere

## 2022-10-03 NOTE — Assessment & Plan Note (Signed)
Chronic Lipids, CMP checked recently at [facility]-reviewed results in care everywhere Continue atorvastatin 80 mg daily, also on Leqvio

## 2022-10-10 ENCOUNTER — Telehealth: Payer: Self-pay | Admitting: Adult Health

## 2022-10-10 NOTE — Telephone Encounter (Signed)
Left patient a message in regards to scheduled appointment times/dates due to provider being out of office; left call back if needed for rescheduling

## 2022-10-17 ENCOUNTER — Ambulatory Visit: Payer: Medicare Other | Admitting: Dermatology

## 2022-11-12 ENCOUNTER — Other Ambulatory Visit: Payer: Self-pay | Admitting: Cardiology

## 2022-11-16 DIAGNOSIS — Z1231 Encounter for screening mammogram for malignant neoplasm of breast: Secondary | ICD-10-CM | POA: Diagnosis not present

## 2022-11-16 LAB — HM MAMMOGRAPHY

## 2022-11-19 ENCOUNTER — Encounter: Payer: Self-pay | Admitting: Internal Medicine

## 2022-12-04 ENCOUNTER — Encounter: Payer: Medicare Other | Admitting: Adult Health

## 2022-12-05 ENCOUNTER — Encounter: Payer: Medicare Other | Admitting: Adult Health

## 2022-12-10 ENCOUNTER — Ambulatory Visit: Payer: Medicare Other

## 2022-12-10 VITALS — BP 150/78 | HR 63 | Temp 97.3°F | Resp 18 | Ht 62.0 in | Wt 188.8 lb

## 2022-12-10 DIAGNOSIS — I10 Essential (primary) hypertension: Secondary | ICD-10-CM

## 2022-12-10 DIAGNOSIS — E785 Hyperlipidemia, unspecified: Secondary | ICD-10-CM | POA: Diagnosis not present

## 2022-12-10 DIAGNOSIS — R931 Abnormal findings on diagnostic imaging of heart and coronary circulation: Secondary | ICD-10-CM

## 2022-12-10 MED ORDER — INCLISIRAN SODIUM 284 MG/1.5ML ~~LOC~~ SOSY
284.0000 mg | PREFILLED_SYRINGE | Freq: Once | SUBCUTANEOUS | Status: AC
Start: 2022-12-10 — End: 2022-12-10
  Administered 2022-12-10: 284 mg via SUBCUTANEOUS
  Filled 2022-12-10: qty 1.5

## 2022-12-10 NOTE — Progress Notes (Signed)
Diagnosis: Hyperlipidemia  Provider:  Chilton Greathouse MD  Procedure: Injection  Leqvio (inclisiran), Dose: 284 mg, Site: subcutaneous, Number of injections: 1  Post Care:  right arm injection  Discharge: Condition: Good, Destination: Home . AVS Declined  Performed by:  Rico Ala, LPN

## 2022-12-18 ENCOUNTER — Encounter: Payer: Medicare Other | Admitting: Adult Health

## 2022-12-25 ENCOUNTER — Ambulatory Visit: Payer: Medicare Other | Attending: Cardiology | Admitting: Cardiology

## 2022-12-25 ENCOUNTER — Encounter: Payer: Self-pay | Admitting: Cardiology

## 2022-12-25 ENCOUNTER — Ambulatory Visit (INDEPENDENT_AMBULATORY_CARE_PROVIDER_SITE_OTHER): Payer: Medicare Other

## 2022-12-25 VITALS — BP 140/70 | HR 54 | Ht 62.0 in | Wt 189.0 lb

## 2022-12-25 DIAGNOSIS — R002 Palpitations: Secondary | ICD-10-CM | POA: Insufficient documentation

## 2022-12-25 DIAGNOSIS — G4733 Obstructive sleep apnea (adult) (pediatric): Secondary | ICD-10-CM | POA: Insufficient documentation

## 2022-12-25 DIAGNOSIS — I10 Essential (primary) hypertension: Secondary | ICD-10-CM | POA: Diagnosis not present

## 2022-12-25 DIAGNOSIS — I2510 Atherosclerotic heart disease of native coronary artery without angina pectoris: Secondary | ICD-10-CM | POA: Diagnosis not present

## 2022-12-25 MED ORDER — LISINOPRIL 20 MG PO TABS: 20.0000 mg | ORAL_TABLET | Freq: Two times a day (BID) | ORAL | 3 refills | Status: DC

## 2022-12-25 MED ORDER — LISINOPRIL 20 MG PO TABS: 20.0000 mg | ORAL_TABLET | Freq: Every day | ORAL | 3 refills | Status: DC

## 2022-12-25 MED ORDER — AMLODIPINE BESYLATE 5 MG PO TABS: 5.0000 mg | ORAL_TABLET | Freq: Every day | ORAL | 3 refills | Status: DC

## 2022-12-25 NOTE — Patient Instructions (Addendum)
Medication Instructions:  Start Amlodipine  5mg  daily  Lisinopril 20 mg twice a day *If you need a refill on your cardiac medications before your next appointment, please call your pharmacy*   Lab Work: none If you have labs (blood work) drawn today and your tests are completely normal, you will receive your results only by: MyChart Message (if you have MyChart) OR A paper copy in the mail If you have any lab test that is abnormal or we need to change your treatment, we will call you to review the results.   Testing/Procedures: Suezanne Jacquet- Long Term Monitor Instructions  Your physician has requested you wear a ZIO patch monitor for 14 days.  This is a single patch monitor. [employer] supplies one patch monitor per enrollment. Additional stickers are not available. Please do not apply patch if you will be having a Nuclear Stress Test,  Echocardiogram, Cardiac CT, MRI, or Chest Xray during the period you would be wearing the  monitor. The patch cannot be worn during these tests. You cannot remove and re-apply the  ZIO XT patch monitor.  Your ZIO patch monitor will be mailed 3 day USPS to your address on file. It may take 3-5 days  to receive your monitor after you have been enrolled.  Once you have received your monitor, please review the enclosed instructions. Your monitor  has already been registered assigning a specific monitor serial # to you.  Billing and Patient Assistance Program Information  We have supplied [employer] with any of your insurance information on file for billing purposes. [employer] offers a sliding scale Patient Assistance Program for patients that do not have  insurance, or whose insurance does not completely cover the cost of the ZIO monitor.  You must apply for the Patient Assistance Program to qualify for this discounted rate.  To apply, please call [employer] at [phone], select option 4, select option 2, ask to apply for  Patient Assistance Program. Meredeth Ide  will ask your household income, and how many people  are in your household. They will quote your out-of-pocket cost based on that information.  [employer] will also be able to set up a 12-month, interest-free payment plan if needed.  Applying the monitor   Shave hair from upper left chest.  Hold abrader disc by orange tab. Rub abrader in 40 strokes over the upper left chest as  indicated in your monitor instructions.  Clean area with 4 enclosed alcohol pads. Let dry.  Apply patch as indicated in monitor instructions. Patch will be placed under collarbone on left  side of chest with arrow pointing upward.  Rub patch adhesive wings for 2 minutes. Remove white label marked "1". Remove the white  label marked "2". Rub patch adhesive wings for 2 additional minutes.  While looking in a mirror, press and release button in center of patch. A small green light will  flash 3-4 times. This will be your only indicator that the monitor has been turned on.  Do not shower for the first 24 hours. You may shower after the first 24 hours.  Press the button if you feel a symptom. You will hear a small click. Record Date, Time and  Symptom in the Patient Logbook.  When you are ready to remove the patch, follow instructions on the last 2 pages of Patient  Logbook. Stick patch monitor onto the last page of Patient Logbook.  Place Patient Logbook in the blue and white box. Use locking tab on box and  tape box closed  securely. The blue and white box has prepaid postage on it. Please place it in the mailbox as  soon as possible. Your physician should have your test results approximately 7 days after the  monitor has been mailed back to [facility].  Call [facility] Customer Care at [phone] if you have questions regarding  your ZIO XT patch monitor. Call them immediately if you see an orange light blinking on your  monitor.  If your monitor falls off in less than 4 days, contact our Monitor department at  [phone].  If your monitor becomes loose or falls off after 4 days call [employer] at [phone] for  suggestions on securing your monitor    Follow-Up: At [facility], you and your health needs are our priority.  As part of our continuing mission to provide you with exceptional heart care, we have created designated Provider Care Teams.  These Care Teams include your primary Cardiologist (physician) and Advanced Practice Providers (APPs -  Physician Assistants and Nurse Practitioners) who all work together to provide you with the care you need, when you need it.  We recommend signing up for the patient portal called "MyChart".  Sign up information is provided on this After Visit Summary.  MyChart is used to connect with patients for Virtual Visits (Telemedicine).  Patients are able to view lab/test results, encounter notes, upcoming appointments, etc.  Non-urgent messages can be sent to your provider as well.   To learn more about what you can do with MyChart, go to [email or web address].    Your next appointment:   6 month(s)  Provider:   Little Ishikawa, MD     Other Instructions Please check blood pressure daily for next two weeks and send via my chart  Referral to  [facility]

## 2022-12-25 NOTE — Progress Notes (Unsigned)
Enrolled for [employer] to mail a ZIO XT long term holter monitor to the patients address on file.  

## 2022-12-25 NOTE — Addendum Note (Signed)
Addended by: Epifanio Lesches on: 12/25/2022 05:36 PM   Modules accepted: Orders

## 2022-12-25 NOTE — Progress Notes (Signed)
Cardiology Office Note:    Date:  12/25/2022   ID:  Chelsea Moreno, Chelsea Moreno 14-Nov-1950, MRN [id number]  PCP:  Pincus Sanes, MD  Cardiologist:  Little Ishikawa, MD  Electrophysiologist:  None   Referring MD: Pincus Sanes, MD   Chief Complaint  Patient presents with   Hypertension    History of Present Illness:    Chelsea Moreno is a 72 y.o. female with a hx of breast cancer, diabetes insipidus, hypertension, hyperlipidemia, OSA on CPAP who presents for follow-up.  She was referred by Dr. Lawerance Bach for evaluation of chest pain, initially seen on 11/30/2019.  She reports that on 11/14/2019, she woke up with chest pain.  Describes dull aching pain in the center of her chest, also with tightness in her throat and radiation to her back.  States the pain was 10 out of 10.  Episode lasted about 10 minutes.  She has had no further episodes of chest pain.  She does not exercise regularly, but does yoga once per week.  Denies any shortness of breath.  Reports occasional lightheadedness, thought to be due to vertigo.  She reports that she has had multiple episodes of syncope, about 1-2 times per year x15 years.  Episodes are associated with nausea/vomiting/diarrhea.  She has not had an episode of syncope over 2 years.  She denies any palpitations.  Reports occasional lower extremity edema.  She did receive radiation treatment for her breast cancer.  She has a history of diabetes insipidus, unclear cause but thought to be due to head trauma as a child.  She smoked for 3 to 4 years, quit in 1974.  Family history includes sister had MI and CVA in early 54s.  Father died of MI in 6s.  Brother had MI in 81s.  Echocardiogram on 12/28/2019 showed normal biventricular function, no significant valvular disease.  Lexiscan Myoview on 12/28/2019 showed EF 63%, apical inferior/inferolateral perfusion defect likely consistent with artifact.  Calcium score on 12/28/2019 was 1202 (98th percentile).  Since last clinic  visit, she reports he has been doing well.  Denies any chest pain, dyspnea, lightheadedness, syncope, lower extremity edema,.  She goes to the [facility] 3 days/week, does treadmill for 30 minutes and 20 to 30 minutes on resistance machines.  Does report she feels very tired after exercising on treadmill but denies any chest pain or dyspnea while exercising.  Does report she has been having palpitations where she feels like heart is racing, happens 1-2 times per week and lasts for few minutes.  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   Past Medical History:  Diagnosis Date   Breast cancer (HCC) 11/05/12   left   Bursitis    COLONIC POLYPS, HX OF 09/24/2008   DEPRESSION 09/23/2007   takes Lexapro daily but hasn't taken since Sept 2014   Diabetes insipidus [facility])    takes Desmopressin bid   Diarrhea    Dizziness    when nauseated gets dizzy   Family history of ovarian cancer    Hemorrhoids    History of bronchitis    yrs ago   History of migraine    last time many yrs ago   Hx of radiation therapy 01/29/13- 03/16/13   left breast 4500 cGy 25 sessions, left breast boost 1000 cGy 5 sessions   HYPERLIPIDEMIA 09/06/2006   takes Atorvasatin nightly   HYPERTENSION 09/06/2006  takes Lisinopril daily   Muscle spasms of head and/or neck    OSTEOPOROSIS 09/24/2008   takes Actonel every 30days   Vitamin D deficiency    takes OTC Vit D    Past Surgical History:  Procedure Laterality Date   BREAST BIOPSY Left 14yrs ago   BREAST LUMPECTOMY WITH NEEDLE LOCALIZATION Left 12/16/2012   Procedure: PARTIAL MASTECTOMY WITH NEEDLE LOCALIZATION;  Surgeon: Almond Lint, MD;  Location: [facility] OR;  Service: General;  Laterality: Left;  1:00 NL at [facility]    CATARACT EXTRACTION Bilateral    2024   COLONOSCOPY      Current Medications: Current Meds  Medication Sig   amLODipine (NORVASC) 5 MG tablet Take 1 tablet (5 mg total) by mouth daily.   aspirin  EC 81 MG tablet Take 1 tablet (81 mg total) by mouth daily. Swallow whole.   atorvastatin (LIPITOR) 80 MG tablet Take 1 tablet (80 mg total) by mouth daily.   baricitinib (OLUMIANT) tablet Take 4 mg by mouth daily.   Calcium Carb-Cholecalciferol (CALTRATE 600+D3) 600-20 MG-MCG TABS Take 1 tablet by mouth daily.   carvedilol (COREG) 6.25 MG tablet TAKE 1 TABLET TWICE A DAY (STOP TOPROL XL)   Cholecalciferol (VITAMIN D3) 25 MCG (1000 UT) capsule Take 2 capsules by mouth daily.   desmopressin (DDAVP NASAL) 0.01 % solution USE 1 SPRAY (10 MCG) IN EACH NOSTRIL TWICE A DAY   Dorzolamide HCl-Timolol Mal PF 2-0.5 % SOLN Place 1 drop into both eyes daily.   inclisiran (LEQVIO) 284 MG/1.5ML SOSY injection Inject 284 mg into the skin every 6 (six) months.   lisinopril (ZESTRIL) 20 MG tablet Take 1 tablet (20 mg total) by mouth daily.   Ondansetron HCl (ZOFRAN PO) Take by mouth.   VYZULTA 0.024 % SOLN INSTILL 1 DROP INTO EACH EYE ONCE DAILY AT BEDTIME   [DISCONTINUED] lisinopril (ZESTRIL) 40 MG tablet TAKE 1 TABLET DAILY     Allergies:   Brinzolamide-brimonidine   Social History   Socioeconomic History   Marital status: Widowed    Spouse name: Not on file   Number of children: Not on file   Years of education: Not on file   Highest education level: Doctorate  Occupational History   Occupation: Professor at [employer]  Tobacco Use   Smoking status: Former    Current packs/day: 0.00    Average packs/day: 0.5 packs/day for 4.0 years (2.0 ttl pk-yrs)    Types: Cigarettes    Start date: 01/07/1969    Quit date: 01/07/1973    Years since quitting: 49.9   Smokeless tobacco: Never   Tobacco comments:    quit 26yrs ago  Substance and Sexual Activity   Alcohol use: Yes    Alcohol/week: 0.0 standard drinks of alcohol    Comment: socailly   Drug use: No   Sexual activity: Not Currently    Birth control/protection: Post-menopausal    Comment: menarche age 14, first live birth age 41, menopause 2002, no  HRT  Other Topics Concern   Not on file  Social History Narrative   Not on file   Social Drivers of Health   Financial Resource Strain: Low Risk  (04/04/2022)   Overall Financial Resource Strain (CARDIA)    Difficulty of Paying Living Expenses: Not hard at all  Food Insecurity: No Food Insecurity (04/04/2022)   Hunger Vital Sign    Worried About Running Out of Food in the Last Year: Never true    Ran Out of Food in the  Last Year: Never true  Transportation Needs: No Transportation Needs (04/04/2022)   PRAPARE - Administrator, Civil Service (Medical): No    Lack of Transportation (Non-Medical): No  Physical Activity: Insufficiently Active (04/04/2022)   Exercise Vital Sign    Days of Exercise per Week: 3 days    Minutes of Exercise per Session: 30 min  Stress: Stress Concern Present (04/04/2022)   [employer] of Occupational Health - Occupational Stress Questionnaire    Feeling of Stress : To some extent  Social Connections: Moderately Isolated (04/04/2022)   Social Connection and Isolation Panel [NHANES]    Frequency of Communication with Friends and Family: Twice a week    Frequency of Social Gatherings with Friends and Family: Once a week    Attends Religious Services: Never    Database administrator or Organizations: Yes    Attends Engineer, structural: More than 4 times per year    Marital Status: Widowed     Family History: The patient's family history includes ALS in her brother; Alcohol abuse in her maternal grandfather; Cancer in her maternal aunt and mother; Heart attack in her father, maternal grandmother, and mother; Heart attack (age of onset: 45) in her brother; Heart attack (age of onset: 12) in her sister; Heart disease in her brother, father, mother, and sister; Hyperlipidemia in her father; Hypertension in her brother, father, and sister; Ovarian cancer in her mother; Parkinson's disease in her sister; Varicose Veins in her mother.  ROS:    Please see the history of present illness.     All other systems reviewed and are negative.  EKGs/Labs/Other Studies Reviewed:    The following studies were reviewed today:   EKG:   12/26/2021: Normal sinus rhythm, poor R wave progression, nonspecific T wave flattening 12/25/2022: Sinus bradycardia, rate 54, poor R wave progression, nonspecific T wave flattening  Recent Labs: 04/04/2022: ALT 23; BUN 14; Creatinine, Ser 0.64; Hemoglobin 12.8; Platelets 252.0; Potassium 4.3; Sodium 138; TSH 0.94  Recent Lipid Panel    Component Value Date/Time   CHOL 127 04/04/2022 1137   CHOL 85 (L) 06/07/2021 1055   TRIG 114.0 04/04/2022 1137   HDL 44.10 04/04/2022 1137   HDL 37 (L) 06/07/2021 1055   CHOLHDL 3 04/04/2022 1137   VLDL 22.8 04/04/2022 1137   LDLCALC 60 04/04/2022 1137   LDLCALC 28 06/07/2021 1055   LDLCALC 155 (H) 09/28/2019 1156   LDLDIRECT 171.8 03/21/2011 0936    Physical Exam:    VS:  BP (!) 140/70   Pulse (!) 54   Ht 5\' 2"  (1.575 m)   Wt 189 lb (85.7 kg)   SpO2 96%   BMI 34.57 kg/m     Wt Readings from Last 3 Encounters:  12/25/22 189 lb (85.7 kg)  12/10/22 188 lb 12.8 oz (85.6 kg)  10/03/22 184 lb (83.5 kg)     GEN:  Well nourished, well developed in no acute distress HEENT: Normal NECK: No JVD; No carotid bruits LYMPHATICS: No lymphadenopathy CARDIAC: RRR, no murmurs, rubs, gallops RESPIRATORY:  Clear to auscultation without rales, wheezing or rhonchi  ABDOMEN: Soft, non-tender, non-distended MUSCULOSKELETAL:  No edema; No deformity  SKIN: Warm and dry NEUROLOGIC:  Alert and oriented x 3 PSYCHIATRIC:  Normal affect   ASSESSMENT:    1. CAD in native artery   2. Essential hypertension   3. Palpitations   4. OSA (obstructive sleep apnea)      PLAN:    CAD:  Reported atypical chest pain.  Echocardiogram on 12/28/2019 showed normal biventricular function, no significant valvular disease.  Lexiscan Myoview on 12/28/2019 showed EF 63%, apical  inferior/inferolateral perfusion defect likely consistent with artifact.  Calcium score on 12/28/2019 was 1202 (98th percentile). -LDL remained above goal despite atorvastatin and Zetia.  She was started on inclisiran, LDL 28 on 06/07/2021.  Most recent LDL 94 on 11/14/2022 -Continue aspirin 81 mg daily  Palpitations: Description concerning for arrhythmia, evaluate with Zio patch x 2 weeks  Hypertension: On lisinopril 40 mg daily and carvedilol 6.25 mg twice daily.  BP elevated, will add amlodipine 5 mg daily.  Asked to check BP daily for next week and let us know results.  Suspect untreated OSA contributing to hypertension as she had to stop using her CPAP due to mouth ulcers.  Will refer to sleep medicine  Hyperlipidemia: LDL above goal despite atorvastatin and Zetia.  She was started on inclisiran, LDL 28 on 06/07/2021.  Most recent LDL 94 on 11/14/2022  OSA: had to stop using CPAP due to mouth ulcers.  Will refer to sleep medicine.  RTC in 6 months     Medication Adjustments/Labs and Tests Ordered: Current medicines are reviewed at length with the patient today.  Concerns regarding medicines are outlined above.  Orders Placed This Encounter  Procedures   Ambulatory referral to Pulmonology   LONG TERM MONITOR (3-14 DAYS)   EKG 12-Lead   Meds ordered this encounter  Medications   lisinopril (ZESTRIL) 20 MG tablet    Sig: Take 1 tablet (20 mg total) by mouth daily.    Dispense:  180 tablet    Refill:  3   amLODipine (NORVASC) 5 MG tablet    Sig: Take 1 tablet (5 mg total) by mouth daily.    Dispense:  90 tablet    Refill:  3    Patient Instructions  Medication Instructions:  Start Amlodipine  5mg  daily  Lisinopril 20 mg twice a day *If you need a refill on your cardiac medications before your next appointment, please call your pharmacy*   Lab Work: none If you have labs (blood work) drawn today and your tests are completely normal, you will receive your results only  by: MyChart Message (if you have MyChart) OR A paper copy in the mail If you have any lab test that is abnormal or we need to change your treatment, we will call you to review the results.   Testing/Procedures: Suezanne Jacquet- Long Term Monitor Instructions  Your physician has requested you wear a ZIO patch monitor for 14 days.  This is a single patch monitor. [employer] supplies one patch monitor per enrollment. Additional stickers are not available. Please do not apply patch if you will be having a Nuclear Stress Test,  Echocardiogram, Cardiac CT, MRI, or Chest Xray during the period you would be wearing the  monitor. The patch cannot be worn during these tests. You cannot remove and re-apply the  ZIO XT patch monitor.  Your ZIO patch monitor will be mailed 3 day USPS to your address on file. It may take 3-5 days  to receive your monitor after you have been enrolled.  Once you have received your monitor, please review the enclosed instructions. Your monitor  has already been registered assigning a specific monitor serial # to you.  Billing and Patient Assistance Program Information  We have supplied [employer] with any of your insurance information on file for billing purposes. [employer] offers a sliding scale  Patient Assistance Program for patients that do not have  insurance, or whose insurance does not completely cover the cost of the ZIO monitor.  You must apply for the Patient Assistance Program to qualify for this discounted rate.  To apply, please call [employer] at [phone], select option 4, select option 2, ask to apply for  Patient Assistance Program. Meredeth Ide will ask your household income, and how many people  are in your household. They will quote your out-of-pocket cost based on that information.  [employer] will also be able to set up a 72-month, interest-free payment plan if needed.  Applying the monitor   Shave hair from upper left chest.  Hold abrader disc by orange tab. Rub  abrader in 40 strokes over the upper left chest as  indicated in your monitor instructions.  Clean area with 4 enclosed alcohol pads. Let dry.  Apply patch as indicated in monitor instructions. Patch will be placed under collarbone on left  side of chest with arrow pointing upward.  Rub patch adhesive wings for 2 minutes. Remove white label marked "1". Remove the white  label marked "2". Rub patch adhesive wings for 2 additional minutes.  While looking in a mirror, press and release button in center of patch. A small green light will  flash 3-4 times. This will be your only indicator that the monitor has been turned on.  Do not shower for the first 24 hours. You may shower after the first 24 hours.  Press the button if you feel a symptom. You will hear a small click. Record Date, Time and  Symptom in the Patient Logbook.  When you are ready to remove the patch, follow instructions on the last 2 pages of Patient  Logbook. Stick patch monitor onto the last page of Patient Logbook.  Place Patient Logbook in the blue and white box. Use locking tab on box and tape box closed  securely. The blue and white box has prepaid postage on it. Please place it in the mailbox as  soon as possible. Your physician should have your test results approximately 7 days after the  monitor has been mailed back to [facility].  Call [facility] Customer Care at [phone] if you have questions regarding  your ZIO XT patch monitor. Call them immediately if you see an orange light blinking on your  monitor.  If your monitor falls off in less than 4 days, contact our Monitor department at [phone].  If your monitor becomes loose or falls off after 4 days call [employer] at [phone] for  suggestions on securing your monitor    Follow-Up: At [facility], you and your health needs are our priority.  As part of our continuing mission to provide you with exceptional heart care, we have created  designated Provider Care Teams.  These Care Teams include your primary Cardiologist (physician) and Advanced Practice Providers (APPs -  Physician Assistants and Nurse Practitioners) who all work together to provide you with the care you need, when you need it.  We recommend signing up for the patient portal called "MyChart".  Sign up information is provided on this After Visit Summary.  MyChart is used to connect with patients for Virtual Visits (Telemedicine).  Patients are able to view lab/test results, encounter notes, upcoming appointments, etc.  Non-urgent messages can be sent to your provider as well.   To learn more about what you can do with MyChart, go to [email or web address].    Your next appointment:  6 month(s)  Provider:   Little Ishikawa, MD     Other Instructions Please check blood pressure daily for next two weeks and send via my chart  Referral to  [facility]        Signed, Little Ishikawa, MD  12/25/2022 5:34 PM    [facility]

## 2022-12-27 ENCOUNTER — Inpatient Hospital Stay: Payer: Medicare Other | Attending: Adult Health | Admitting: Adult Health

## 2022-12-27 ENCOUNTER — Encounter: Payer: Self-pay | Admitting: Adult Health

## 2022-12-27 VITALS — BP 140/65 | HR 60 | Temp 97.6°F | Resp 17 | Ht 62.0 in | Wt 186.8 lb

## 2022-12-27 DIAGNOSIS — Z170 Estrogen receptor positive status [ER+]: Secondary | ICD-10-CM | POA: Insufficient documentation

## 2022-12-27 DIAGNOSIS — Z923 Personal history of irradiation: Secondary | ICD-10-CM | POA: Insufficient documentation

## 2022-12-27 DIAGNOSIS — C50312 Malignant neoplasm of lower-inner quadrant of left female breast: Secondary | ICD-10-CM | POA: Insufficient documentation

## 2022-12-27 DIAGNOSIS — R002 Palpitations: Secondary | ICD-10-CM

## 2022-12-27 DIAGNOSIS — Z79810 Long term (current) use of selective estrogen receptor modulators (SERMs): Secondary | ICD-10-CM | POA: Diagnosis not present

## 2022-12-27 NOTE — Progress Notes (Signed)
[facility] Cancer Follow up:    Chelsea Sanes, MD [address] [place] [address]   DIAGNOSIS:  Cancer Staging  Cancer of lower-inner quadrant of left female breast [facility]) Staging form: Breast, AJCC 7th Edition - Clinical: Stage 0 (Tis, N0, cM0) - Unsigned Specimen type: Core Needle Biopsy Histopathologic type: 9932 Laterality: Left Staging comments: Staged at breast conference 11.5.14  - Pathologic: No stage assigned - Unsigned Specimen type: Core Needle Biopsy Histopathologic type: 9932 Laterality: Left   SUMMARY OF ONCOLOGIC HISTORY: Oncology History  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  The Hereditary Gene Panel offered by [facility] includes sequencing and/or deletion duplication testing of the following 47 genes: APC, ATM, AXIN2, BARD1, BMPR1A, BRCA1, BRCA2, BRIP1, CDH1, CDK4, CDKN2A (p14ARF), CDKN2A (p16INK4a), CHEK2, CTNNA1, DICER1, EPCAM (Deletion/duplication testing only), GREM1 (promoter region deletion/duplication testing only), KIT, MEN1, MLH1, MSH2, MSH3, MSH6, MUTYH, NBN, NF1, NHTL1, PALB2, PDGFRA, PMS2, POLD1, POLE, PTEN, RAD50, RAD51C, RAD51D, SDHB, SDHC, SDHD, SMAD4, SMARCA4. STK11, TP53, TSC1, TSC2, and VHL.  The following genes were evaluated for sequence changes only: SDHA and HOXB13 c.251G>A variant only. The report date is February 19, 2017.     CURRENT THERAPY: observation  INTERVAL HISTORY: Chelsea Moreno 72 y.o. female with a history of breast cancer, presents for routine follow-up. The  patient is currently on observation alone, with the most recent mammogram in November 2024 showing no evidence of malignancy and breast density category A. The patient reports no concerns related to the breasts or any lingering effects from previous treatments. It has been ten years since the initial diagnosis and treatment.  The patient has been maintaining an active lifestyle, with a commitment to regular exercise at the local [employer] and yoga classes. Despite not noticing significant changes in blood work, the patient reports feeling better both physically and mentally due to the exercise regimen.  In terms of other health changes, the patient underwent cataract surgery in both eyes over the summer. A new blood pressure medication was added to the regimen this week. The patient is due for a colonoscopy, marking the ten-year interval since the last one. The patient has had two colonoscopies in the past.  The patient's diet includes a good intake of fruits, but struggles with incorporating a variety of vegetables, favoring the starchy ones. The patient is committed to continuing the exercise regimen and maintaining a healthy diet.   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  MEDICAL HISTORY: Past Medical History:  Diagnosis Date   Breast cancer (HCC) 11/05/12   left   Bursitis    COLONIC POLYPS, HX OF 09/24/2008   DEPRESSION 09/23/2007   takes Lexapro daily but hasn't taken since Sept 2014   Diabetes insipidus (HCC)    takes Desmopressin bid   Diarrhea    Dizziness    when nauseated gets dizzy   Family history of ovarian cancer    Hemorrhoids    History of bronchitis    yrs ago   History of migraine    last time many yrs ago   Hx of radiation therapy 01/29/13- 03/16/13   left breast 4500 cGy 25 sessions, left breast boost 1000 cGy 5 sessions   HYPERLIPIDEMIA 09/06/2006   takes Atorvasatin nightly   HYPERTENSION 09/06/2006   takes Lisinopril daily   Muscle spasms of head and/or neck    OSTEOPOROSIS 09/24/2008   takes Actonel every 30days   Vitamin D deficiency    takes OTC Vit D    SURGICAL HISTORY: Past Surgical History:  Procedure Laterality Date   BREAST BIOPSY Left 63yrs ago   BREAST LUMPECTOMY WITH NEEDLE LOCALIZATION Left 12/16/2012   Procedure: PARTIAL MASTECTOMY WITH NEEDLE LOCALIZATION;  Surgeon: Almond Lint, MD;  Location: [facility] OR;  Service: General;  Laterality: Left;  1:00 NL at [facility]    CATARACT EXTRACTION Bilateral    2024   COLONOSCOPY      SOCIAL HISTORY: Social History   Socioeconomic History   Marital status: Widowed    Spouse name: Not on file   Number of children: Not on file   Years of education: Not on file   Highest education level: Doctorate  Occupational History   Occupation: Professor at [employer]  Tobacco Use   Smoking status: Former    Current packs/day: 0.00    Average packs/day: 0.5 packs/day for 4.0 years (2.0 ttl pk-yrs)    Types: Cigarettes    Start date: 01/07/1969    Quit date: 01/07/1973    Years since quitting: 50.0   Smokeless tobacco: Never   Tobacco comments:    quit 62yrs ago  Substance and Sexual Activity   Alcohol use: Yes    Alcohol/week: 0.0 standard drinks of  alcohol    Comment: socailly   Drug use: No   Sexual activity: Not Currently    Birth control/protection: Post-menopausal    Comment: menarche age 38, first live birth age 35, menopause 2002, no HRT  Other Topics Concern   Not on file  Social History Narrative   Not on file   Social Drivers of Health   Financial Resource Strain: Low Risk  (04/04/2022)   Overall Financial Resource Strain (CARDIA)    Difficulty of Paying Living Expenses: Not hard at all  Food Insecurity: No Food Insecurity (04/04/2022)   Hunger Vital Sign    Worried About Running Out of Food in the Last Year: Never true    Ran Out of Food in the Last Year: Never true  Transportation Needs: No Transportation Needs (04/04/2022)   PRAPARE - Administrator, Civil Service (Medical): No    Lack of Transportation (Non-Medical): No  Physical Activity: Insufficiently Active (04/04/2022)   Exercise Vital Sign    Days of Exercise per Week: 3 days    Minutes of Exercise per  Session: 30 min  Stress: Stress Concern Present (04/04/2022)   [employer] of Occupational Health - Occupational Stress Questionnaire    Feeling of Stress : To some extent  Social Connections: Moderately Isolated (04/04/2022)   Social Connection and Isolation Panel [NHANES]    Frequency of Communication with Friends and Family: Twice a week    Frequency of Social Gatherings with Friends and Family: Once a week    Attends Religious Services: Never    Database administrator or Organizations: Yes    Attends Engineer, structural: More than 4 times per year    Marital Status: Widowed  Intimate Partner Violence: Not At Risk (04/04/2022)   Humiliation, Afraid, Rape, and Kick questionnaire    Fear of Current or Ex-Partner: No    Emotionally Abused: No    Physically Abused: No    Sexually Abused: No    FAMILY HISTORY: Family History  Problem Relation Age of Onset   Ovarian cancer Mother    Cancer Mother        Ovarian   Heart  disease Mother        Before age 76   Heart attack Mother    Varicose Veins Mother    Heart disease Father        Before age 82   Hyperlipidemia Father    Hypertension Father    Heart attack Father    Heart disease Sister        Before age 71   Hypertension Sister    Heart attack Sister 46       second heart attack at 93   Heart disease Brother        Before age 73   Hypertension Brother    Heart attack Brother 73   Cancer Maternal Aunt        GYN cancer   Heart attack Maternal Grandmother    Alcohol abuse Maternal Grandfather    Parkinson's disease Sister    ALS Brother     Review of Systems  Constitutional:  Negative for appetite change, chills, fatigue, fever and unexpected weight change.  HENT:   Negative for hearing loss, lump/mass and trouble swallowing.   Eyes:  Negative for eye problems and icterus.  Respiratory:  Negative for chest tightness, cough and shortness of breath.   Cardiovascular:  Negative for chest pain, leg swelling and palpitations.  Gastrointestinal:  Negative for abdominal distention, abdominal pain, constipation, diarrhea, nausea and vomiting.  Endocrine: Negative for hot flashes.  Genitourinary:  Negative for difficulty urinating.   Musculoskeletal:  Negative for arthralgias.  Skin:  Negative for itching and rash.  Neurological:  Negative for dizziness, extremity weakness, headaches and numbness.  Hematological:  Negative for adenopathy. Does not bruise/bleed easily.  Psychiatric/Behavioral:  Negative for depression. The patient is not nervous/anxious.       PHYSICAL EXAMINATION   Onc Performance Status - 12/27/22 1000       KPS SCALE   KPS % SCORE Able to carry on normal activity, minor s/s of disease             Vitals:   12/27/22 1012  BP: (!) 140/65  Pulse: 60  Resp: 17  Temp: 97.6 F (36.4 C)  SpO2: 100%    Physical Exam Constitutional:      General: She is not in acute distress.    Appearance: Normal appearance.  She is not toxic-appearing.  HENT:     Head: Normocephalic and atraumatic.  Mouth/Throat:     Mouth: Mucous membranes are moist.     Pharynx: Oropharynx is clear. No oropharyngeal exudate or posterior oropharyngeal erythema.  Eyes:     General: No scleral icterus. Cardiovascular:     Rate and Rhythm: Normal rate and regular rhythm.     Pulses: Normal pulses.     Heart sounds: Normal heart sounds.  Pulmonary:     Effort: Pulmonary effort is normal.     Breath sounds: Normal breath sounds.  Chest:     Comments: Left bresat s/p lumpectomy and radiation, no sign of local recurrence, right breast benign Abdominal:     General: Abdomen is flat. Bowel sounds are normal. There is no distension.     Palpations: Abdomen is soft.     Tenderness: There is no abdominal tenderness.  Musculoskeletal:        General: No swelling.     Cervical back: Neck supple.  Lymphadenopathy:     Cervical: No cervical adenopathy.     Upper Body:     Right upper body: No axillary adenopathy.     Left upper body: No axillary adenopathy.  Skin:    General: Skin is warm and dry.     Findings: No rash.  Neurological:     General: No focal deficit present.     Mental Status: She is alert.  Psychiatric:        Mood and Affect: Mood normal.        Behavior: Behavior normal.       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  History of Breast Cancer No concerns with breasts. No effects of treatments. Mammogram on 11/16/2022 showed no evidence of malignancy and breast density category A. Physical examination revealed no signs of recurrence. -Continue annual f/u.  -Continue annual mammograms  Hypertension New blood pressure medication  added recently. -Continue current management under primary care.  Cataract Surgery Underwent cataract surgery for both eyes over the summer. -Continue follow-up as needed.  Colon Cancer Screening Due for colonoscopy, last one was 10 years ago. -Plan for colonoscopy as per guidelines.  General Health Maintenance Regular exercise at the [facility] and yoga. Consuming fruits regularly, but struggling with vegetable intake. -Continue regular exercise. -Increase vegetable intake if possible.  Follow-up No immediate concerns. -Return if any issues arise before next scheduled visit.   All questions were answered. The patient knows to call the clinic with any problems, questions or concerns. We can certainly see the patient much sooner if necessary.  Total encounter time:20 minutes*in face-to-face visit time, chart review, lab review, care coordination, order entry, and documentation of the encounter time.    Lillard Anes, NP 12/27/22 12:21 PM Medical Oncology and Hematology [facility] [address] [place], [place] [address] Tel. [phone]    Fax. [phone]  *Total Encounter Time as defined by the Centers for Medicare and Medicaid Services includes, in addition to the face-to-face time of a patient visit (documented in the note above) non-face-to-face time: obtaining and reviewing outside history, ordering and reviewing medications, tests or procedures, care coordination (communications with other health care professionals or caregivers) and documentation in the medical record.

## 2022-12-27 NOTE — Assessment & Plan Note (Signed)
Chelsea Moreno is a 72 year old woman with history of ER/PR positive DCIS of the left breast diagnosed in October 2014 status postlumpectomy, adjuvant radiation, and antiestrogen therapy with tamoxifen x 5 years completed in 2020.  History of Breast Cancer No concerns with breasts. No effects of treatments. Mammogram on 11/16/2022 showed no evidence of malignancy and breast density category A. Physical examination revealed no signs of recurrence. -Continue annual f/u.  -Continue annual mammograms  Hypertension New blood pressure medication added recently. -Continue current management under primary care.  Cataract Surgery Underwent cataract surgery for both eyes over the summer. -Continue follow-up as needed.  Colon Cancer Screening Due for colonoscopy, last one was 10 years ago. -Plan for colonoscopy as per guidelines.  General Health Maintenance Regular exercise at the [facility] and yoga. Consuming fruits regularly, but struggling with vegetable intake. -Continue regular exercise. -Increase vegetable intake if possible.  Follow-up No immediate concerns. -Return if any issues arise before next scheduled visit.

## 2022-12-28 DIAGNOSIS — Z961 Presence of intraocular lens: Secondary | ICD-10-CM | POA: Diagnosis not present

## 2022-12-28 DIAGNOSIS — H401131 Primary open-angle glaucoma, bilateral, mild stage: Secondary | ICD-10-CM | POA: Diagnosis not present

## 2023-01-10 ENCOUNTER — Encounter: Payer: Self-pay | Admitting: Cardiology

## 2023-01-10 ENCOUNTER — Other Ambulatory Visit: Payer: Self-pay

## 2023-01-10 DIAGNOSIS — I10 Essential (primary) hypertension: Secondary | ICD-10-CM

## 2023-01-10 MED ORDER — LISINOPRIL 20 MG PO TABS: 20.0000 mg | ORAL_TABLET | Freq: Two times a day (BID) | ORAL | 3 refills | Status: DC

## 2023-01-10 MED ORDER — AMLODIPINE BESYLATE 5 MG PO TABS: 5.0000 mg | ORAL_TABLET | Freq: Every day | ORAL | 3 refills | Status: DC

## 2023-01-11 NOTE — Telephone Encounter (Signed)
 BP remains elevated, recommend increasing to amlodipine 10 mg daily.  Check BP daily for next 2 weeks and let us know results

## 2023-01-14 DIAGNOSIS — H02824 Cysts of left upper eyelid: Secondary | ICD-10-CM | POA: Diagnosis not present

## 2023-01-15 MED ORDER — AMLODIPINE BESYLATE 10 MG PO TABS: 10.0000 mg | ORAL_TABLET | Freq: Every day | ORAL | 3 refills | Status: DC

## 2023-01-15 NOTE — Telephone Encounter (Signed)
 Called and left patient a VM with Dr. Bjorn Pippin recommendations, increase Amlodipine 10 mg and check blood pressure for 2 weeks. Orders and initiated and medications sent to Pharmacy. Left message for patient to call office for ant questions.

## 2023-01-17 DIAGNOSIS — R002 Palpitations: Secondary | ICD-10-CM | POA: Diagnosis not present

## 2023-01-21 DIAGNOSIS — E871 Hypo-osmolality and hyponatremia: Secondary | ICD-10-CM | POA: Diagnosis not present

## 2023-01-30 ENCOUNTER — Other Ambulatory Visit: Payer: Self-pay | Admitting: Cardiology

## 2023-01-30 ENCOUNTER — Encounter: Payer: Self-pay | Admitting: Cardiology

## 2023-02-19 ENCOUNTER — Encounter: Payer: Self-pay | Admitting: Dermatology

## 2023-02-19 ENCOUNTER — Telehealth: Payer: Self-pay

## 2023-02-19 ENCOUNTER — Ambulatory Visit (INDEPENDENT_AMBULATORY_CARE_PROVIDER_SITE_OTHER): Payer: Medicare Other | Admitting: Dermatology

## 2023-02-19 VITALS — BP 112/66 | HR 61

## 2023-02-19 DIAGNOSIS — C50919 Malignant neoplasm of unspecified site of unspecified female breast: Secondary | ICD-10-CM | POA: Insufficient documentation

## 2023-02-19 DIAGNOSIS — L438 Other lichen planus: Secondary | ICD-10-CM

## 2023-02-19 DIAGNOSIS — L821 Other seborrheic keratosis: Secondary | ICD-10-CM | POA: Diagnosis not present

## 2023-02-19 DIAGNOSIS — L820 Inflamed seborrheic keratosis: Secondary | ICD-10-CM

## 2023-02-19 NOTE — Telephone Encounter (Signed)
I was unable to find a DL for pt's CPAP machine. I called [facility] and spoke to [place] who stated they were unable to see any data as well and the pt would have to bring an SD card or machine into the office. Tawanna Cooler also stated the pt is due for a new CPAP machine if she wants one.  I also called the pt, and asked her to bring her CPAP machine in tomorrow for her appointment.  Pt verbalized understanding. NFN

## 2023-02-19 NOTE — Progress Notes (Signed)
   New Patient Visit   Subjective  Chelsea Moreno is a 73 y.o. 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 Patient reports the areas have been there for several months. She reports the areas are not bothersome. Patient rates irritation 0 out of 10. She states that the areas have not spread. 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 Patient reports Hx of bx. Patient denies family history of skin cancer(s).  The following portions of the chart were reviewed this encounter and updated as appropriate: medications, allergies, medical history  Review of Systems:  No other skin or systemic complaints except as noted in HPI or Assessment and Plan.  Objective  Well appearing patient in no apparent distress; mood and affect are within normal limits.  A focused examination was performed of the following areas: Face  Relevant exam findings are noted in the Assessment and Plan.  Left Anterior Neck, Left Zygomatic Area  Stuck-on, waxy, tan-brown papules  Assessment & Plan   SEBORRHEIC KERATOSIS - Stuck-on, waxy, tan-brown papules - Benign-appearing - Discussed benign etiology and prognosis. - Observe - Call for any changes  Oral Lichen Planus Assessment: Patient completed a 40-month regimen of baricitinib for oral lichen planus, resulting in significant improvement, including clearing of ulcers under the tongue, inside the cheek, and reduced gum inflammation. Since discontinuation on January 29th, increased soreness and some swelling have been reported, but no new ulcers.  Plan: Schedule follow-up appointment for the end of the month. Monitor for recurrence of ulcers and progression of symptoms. Consider the potential for Olumiant  (baricitinib) to become available for treatment. Note: Patient was part of an investigator-initiated study with 10 participants.       INFLAMED SEBORRHEIC KERATOSIS (2) Left Anterior Neck, Left Zygomatic Area Symptomatic, irritating, patient would like treated.  Benign-appearing.  Call clinic for new or changing lesions.   Destruction of lesion - Left Anterior Neck, Left Zygomatic Area Complexity: simple   Destruction method: cryotherapy   Informed consent: discussed and consent obtained   Timeout:  patient name, date of birth, surgical site, and procedure verified Lesion destroyed using liquid nitrogen: Yes   Cryotherapy cycles:  2 Post-procedure details: wound care instructions given    Return if symptoms worsen or fail to improve.  Documentation: I have reviewed the above documentation for accuracy and completeness, and I agree with the above.  Stasia Cavalier, am acting as scribe for Langston Reusing, DO.  Langston Reusing, DO

## 2023-02-19 NOTE — Patient Instructions (Addendum)
Hello Whisper,    Here is a summary of the key instructions and recommendations from our appointment:  Oral Lichen Planus Monitoring: Continue monitoring the symptoms, especially since the soreness has increased after stopping baricitinib.  Cryotherapy for Skin Growths: We performed cryotherapy to treat the growths on your skin.   Post-treatment care includes:   Application: Apply Aquaphor and cover with band-aids to promote healing.   Healing Process: Expect the treated areas to become crusty, followed by scabbing and eventual healing.  Skin Moisturization: Keep your skin moisturized, particularly during dry seasons, to prevent further dryness.  Follow-Up: Schedule a follow-up at the end of the month as planned to assess progress post-medication and post-cryotherapy.   Please ensure to keep your follow-up appointment. Do not hesitate to contact the office if you have any questions or concerns in the meantime.  Warm regards,  Dr. Langston Reusing, Dermatology     Cryotherapy Aftercare  Wash gently with soap and water everyday.   Apply Vaseline and Band-Aid daily until healed.    Important Information   Due to recent changes in healthcare laws, you may see results of your pathology and/or laboratory studies on MyChart before the doctors have had a chance to review them. We understand that in some cases there may be results that are confusing or concerning to you. Please understand that not all results are received at the same time and often the doctors may need to interpret multiple results in order to provide you with the best plan of care or course of treatment. Therefore, we ask that you please give [place] 2 business days to thoroughly review all your results before contacting the office for clarification. Should we see a critical lab result, you will be contacted sooner.     If You Need Anything After Your Visit   If you have any questions or concerns for your doctor, please call  our main line at [phone]. If no one answers, please leave a voicemail as directed and we will return your call as soon as possible. Messages left after 4 pm will be answered the following business day.    You may also send [place] a message via MyChart. We typically respond to MyChart messages within 1-2 business days.  For prescription refills, please ask your pharmacy to contact our office. Our fax number is [phone].  If you have an urgent issue when the clinic is closed that cannot wait until the next business day, you can page your doctor at the number below.     Please note that while we do our best to be available for urgent issues outside of office hours, we are not available 24/7.    If you have an urgent issue and are unable to reach [place], you may choose to seek medical care at your doctor's office, retail clinic, urgent care center, or emergency room.   If you have a medical emergency, please immediately call 911 or go to the emergency department. In the event of inclement weather, please call our main line at [phone] for an update on the status of any delays or closures.  Dermatology Medication Tips: Please keep the boxes that topical medications come in in order to help keep track of the instructions about where and how to use these. Pharmacies typically print the medication instructions only on the boxes and not directly on the medication tubes.   If your medication is too expensive, please contact our office at [phone] or send [place] a message  through MyChart.    We are unable to tell what your co-pay for medications will be in advance as this is different depending on your insurance coverage. However, we may be able to find a substitute medication at lower cost or fill out paperwork to get insurance to cover a needed medication.    If a prior authorization is required to get your medication covered by your insurance company, please allow [place] 1-2 business days to complete  this process.   Drug prices often vary depending on where the prescription is filled and some pharmacies may offer cheaper prices.   The website www.goodrx.com contains coupons for medications through different pharmacies. The prices here do not account for what the cost may be with help from insurance (it may be cheaper with your insurance), but the website can give you the price if you did not use any insurance.  - You can print the associated coupon and take it with your prescription to the pharmacy.  - You may also stop by our office during regular business hours and pick up a GoodRx coupon card.  - If you need your prescription sent electronically to a different pharmacy, notify our office through [facility] or by phone at [phone]

## 2023-02-20 ENCOUNTER — Encounter: Payer: Self-pay | Admitting: Primary Care

## 2023-02-20 ENCOUNTER — Ambulatory Visit: Payer: Medicare Other | Admitting: Primary Care

## 2023-02-20 VITALS — BP 113/75 | HR 55 | Temp 97.6°F | Ht 62.0 in | Wt 186.4 lb

## 2023-02-20 DIAGNOSIS — I10 Essential (primary) hypertension: Secondary | ICD-10-CM

## 2023-02-20 DIAGNOSIS — L439 Lichen planus, unspecified: Secondary | ICD-10-CM

## 2023-02-20 DIAGNOSIS — G4733 Obstructive sleep apnea (adult) (pediatric): Secondary | ICD-10-CM

## 2023-02-20 NOTE — Patient Instructions (Signed)
 -  OBSTRUCTIVE SLEEP APNEA (OSA): Obstructive Sleep Apnea is a condition where your breathing stops and starts repeatedly during sleep. We will order a repeat sleep study to reassess the severity of your sleep apnea after your weight loss and the discontinuation of CPAP therapy. Based on the results, we will discuss the potential resumption of CPAP therapy after consulting with dermatology regarding your oral lichen planus.  -ORAL LICHEN PLANUS: Oral Lichen Planus is an autoimmune condition that causes painful mouth ulcers. We will consult with dermatology to manage this condition and discuss the potential resumption of CPAP therapy.  -HYPERTENSION: Hypertension is high blood pressure. You should continue your current blood pressure medication as directed by your cardiologist.  Follow-up: Please schedule visit in April 2025 to discuss the results of your sleep study and the potential resumption of CPAP therapy.

## 2023-02-20 NOTE — Progress Notes (Signed)
@Patient  ID: Chelsea Moreno, female    DOB: 18-Oct-1950, 73 y.o.   MRN: [id number]  No chief complaint on file.   Referring provider: Little Ishikawa*  HPI: 73 year old female, former smoker.  Past medical history significant for hypertension, OSA, prediabetes, dyslipidemia, history of breast cancer. Former Dr. Craige Cotta patient.   02/20/2023 Discussed the use of AI scribe software for clinical note transcription with the patient, who gave verbal consent to proceed.  History of Present Illness   Chelsea Moreno is a 73 year old female with moderate sleep apnea who presents for re-evaluation of her condition.  Patient had a home sleep study in August 2016 with Dr. Craige Cotta that showed evidence of moderate obstructive sleep apnea, AHI 17.8 with SpO2 low 85%. Previously on auto CPAP. She experiences some intermittent aerophagia with bloating, it was recommended if worsening to consider changing mask or adjusting pressure settings.   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 Due to these complications, she discontinued CPAP use over the past year, particularly during a clinical trial she participated in for the last six months.  She has oral lichen planus, an autoimmune condition causing mouth ulcers. The CPAP machine exacerbated these ulcers by drying out her mouth, leading to bleeding. She recently completed a six-month course of oral medication for this condition, which resolved the ulcers but left residual swelling and soreness.   Regarding her sleep, she experiences frequent awakenings during the night, sometimes waking with a startle and being unable to return to sleep. She describes restless sleep and daytime fatigue, with episodes of extreme exhaustion affecting her daily activities. No loud snoring, but there is a history of snoring as reported by her husband. She does not take any sleep aids  regularly, having tried melatonin and Benadryl only once or twice, which affected her dreams.  She has experienced difficulties with blood pressure control, which her cardiologist has addressed by adding and increasing medication. He would like her to get back on CPAP if clinically indicated.   She notes a weight loss of 20 pounds over the past year, attributed to intentional lifestyle changes including starting an exercise regimen in January and dietary restrictions due to her oral condition.      Sleep tests: HST 09/01/14 >> AHI 17.8, SaO2 low 85% Auto CPAP 11/08/15 to 12/07/15 >> used on 30 of 30 nights with average 6 hrs 43 min.  Average AHI 1.1 with median CPAP 10 and 95 th percentile CPAP 14 cm H2O  Allergies  Allergen Reactions   Brinzolamide-Brimonidine Dermatitis, Other (See Comments) and Rash    Immunization History  Administered Date(s) Administered   Fluad Quad(high Dose 65+) 09/10/2018, 09/28/2019, 09/28/2020, 12/04/2021   Fluad Trivalent(High Dose 65+) 10/03/2022   Influenza Split 09/25/2010, 11/16/2011   Influenza Whole 09/26/2009   Influenza, High Dose Seasonal PF 01/15/2017   Influenza,inj,Quad PF,6+ Mos 11/09/2015   Influenza,inj,quad, With Preservative 10/08/2017   PFIZER(Purple Top)SARS-COV-2 Vaccination 02/15/2019, 03/11/2019, 10/19/2019   Pneumococcal Conjugate-13 07/17/2017   Pneumococcal Polysaccharide-23 09/10/2018   Td 09/24/2008    Past Medical History:  Diagnosis Date   Breast cancer (HCC) 11/05/12   left   Bursitis    COLONIC POLYPS, HX OF 09/24/2008   DEPRESSION 09/23/2007   takes Lexapro daily but hasn't taken since Sept 2014   Diabetes insipidus (HCC)    takes Desmopressin bid   Diarrhea    Dizziness  when nauseated gets dizzy   Family history of ovarian cancer    Hemorrhoids    History of bronchitis    yrs ago   History of migraine    last time many yrs ago   Hx of radiation therapy 01/29/13- 03/16/13   left breast 4500 cGy 25 sessions,  left breast boost 1000 cGy 5 sessions   HYPERLIPIDEMIA 09/06/2006   takes Atorvasatin nightly   HYPERTENSION 09/06/2006   takes Lisinopril daily   Muscle spasms of head and/or neck    OSTEOPOROSIS 09/24/2008   takes Actonel every 30days   Vitamin D deficiency    takes OTC Vit D    Tobacco History: Social History   Tobacco Use  Smoking Status Former   Current packs/day: 0.00   Average packs/day: 0.5 packs/day for 4.0 years (2.0 ttl pk-yrs)   Types: Cigarettes   Start date: 01/07/1969   Quit date: 01/07/1973   Years since quitting: 50.1   Passive exposure: Never  Smokeless Tobacco Never  Tobacco Comments   quit 10yrs ago   Counseling given: Not Answered Tobacco comments: quit 47yrs ago   Outpatient Medications Prior to Visit  Medication Sig Dispense Refill   amLODipine (NORVASC) 10 MG tablet Take 1 tablet (10 mg total) by mouth daily. 90 tablet 3   aspirin EC 81 MG tablet Take 1 tablet (81 mg total) by mouth daily. Swallow whole. 30 tablet 12   atorvastatin (LIPITOR) 80 MG tablet Take 1 tablet (80 mg total) by mouth daily. 90 tablet 3   BARICITINIB PO Take by mouth. (Patient not taking: Reported on 02/19/2023)     Calcium Carb-Cholecalciferol (CALTRATE 600+D3) 600-20 MG-MCG TABS Take 1 tablet by mouth daily.     carvedilol (COREG) 6.25 MG tablet TAKE 1 TABLET TWICE A DAY 180 tablet 3   Cholecalciferol (VITAMIN D3) 25 MCG (1000 UT) capsule Take 2 capsules by mouth daily.     desmopressin (DDAVP NASAL) 0.01 % solution USE 1 SPRAY (10 MCG) IN EACH NOSTRIL TWICE A DAY 10 mL 13   Dorzolamide HCl-Timolol Mal PF 2-0.5 % SOLN Place 1 drop into both eyes daily.     inclisiran (LEQVIO) 284 MG/1.5ML SOSY injection Inject 284 mg into the skin every 6 (six) months.     lisinopril (ZESTRIL) 20 MG tablet Take 1 tablet (20 mg total) by mouth in the morning and at bedtime. 180 tablet 3   Ondansetron HCl (ZOFRAN PO) Take by mouth.     VYZULTA 0.024 % SOLN INSTILL 1 DROP INTO EACH EYE ONCE DAILY  AT BEDTIME     No facility-administered medications prior to visit.    Review of Systems  Review of Systems  Constitutional: Negative.   HENT: Negative.    Respiratory: Negative.    Cardiovascular: Negative.      Physical Exam  There were no vitals taken for this visit. Physical Exam Constitutional:      Appearance: Normal appearance.  HENT:     Head: Normocephalic and atraumatic.  Cardiovascular:     Rate and Rhythm: Normal rate and regular rhythm.  Pulmonary:     Effort: Pulmonary effort is normal.     Breath sounds: Normal breath sounds.  Skin:    General: Skin is warm and dry.  Neurological:     General: No focal deficit present.     Mental Status: She is alert and oriented to person, place, and time. Mental status is at baseline.  Psychiatric:  Mood and Affect: Mood normal.        Behavior: Behavior normal.        Thought Content: Thought content normal.        Judgment: Judgment normal.      Lab Results:  CBC    Component Value Date/Time   WBC 5.4 04/04/2022 1137   RBC 4.02 04/04/2022 1137   HGB 12.8 04/04/2022 1137   HGB 13.8 05/20/2014 1412   HCT 36.8 04/04/2022 1137   HCT 38.8 05/20/2014 1412   PLT 252.0 04/04/2022 1137   PLT 236 05/20/2014 1412   MCV 91.6 04/04/2022 1137   MCV 88.2 05/20/2014 1412   MCH 31.4 05/20/2014 1412   MCH 31.8 12/10/2012 0935   MCHC 34.7 04/04/2022 1137   RDW 13.1 04/04/2022 1137   RDW 12.7 05/20/2014 1412   LYMPHSABS 1.1 04/04/2022 1137   LYMPHSABS 1.2 05/20/2014 1412   MONOABS 0.5 04/04/2022 1137   MONOABS 0.6 05/20/2014 1412   EOSABS 0.1 04/04/2022 1137   EOSABS 0.1 05/20/2014 1412   BASOSABS 0.1 04/04/2022 1137   BASOSABS 0.0 05/20/2014 1412    BMET    Component Value Date/Time   NA 138 04/04/2022 1137   NA 143 11/18/2020 1029   NA 140 05/20/2014 1413   K 4.3 04/04/2022 1137   K 4.2 05/20/2014 1413   CL 102 04/04/2022 1137   CO2 29 04/04/2022 1137   CO2 24 05/20/2014 1413   GLUCOSE 108 (H)  04/04/2022 1137   GLUCOSE 111 05/20/2014 1413   BUN 14 04/04/2022 1137   BUN 13 11/18/2020 1029   BUN 9.0 05/20/2014 1413   CREATININE 0.64 04/04/2022 1137   CREATININE 0.69 09/28/2019 1156   CREATININE 0.7 05/20/2014 1413   CALCIUM 9.6 04/04/2022 1137   CALCIUM 9.1 05/20/2014 1413   GFRNONAA 89 09/28/2019 1156   GFRAA 104 09/28/2019 1156    BNP No results found for: "BNP"  ProBNP No results found for: "PROBNP"  Imaging: No results found.   Assessment & Plan:   1. OSA (obstructive sleep apnea) (Primary) - Home sleep test; Future     Obstructive Sleep Apnea (OSA) History of moderate OSA diagnosed in 2016. CPAP therapy was previously effective but has been discontinued for the past 6 months due to oral lichen planus causing oral ulcers exacerbated by CPAP use. Patient reports weight loss of 20 pounds, restless sleep, and daytime fatigue. -Order repeat sleep study to reassess severity of OSA after weight loss and prolonged CPAP discontinuation. -Discuss results and potential resumption of CPAP therapy after consultation with dermatology regarding oral lichen planus management.  Oral Lichen Planus Autoimmune condition causing oral ulcers and discomfort, exacerbated by CPAP use. Recently completed a 65-month oral medication trial. -Consult with dermatology regarding management and potential resumption of CPAP therapy.  Hypertension Difficulty controlling blood pressure, recently managed with medication adjustment by cardiologist. -Continue current antihypertensive regimen as directed by cardiologist. Resume CPAP if clinically indicated.   Follow-up in April 2025 to discuss sleep study results and potential resumption of CPAP therapy.      Glenford Bayley, NP 02/20/2023

## 2023-02-22 ENCOUNTER — Telehealth: Payer: Self-pay | Admitting: Pharmacy Technician

## 2023-02-22 NOTE — Telephone Encounter (Signed)
Auth Submission: NO AUTH NEEDED Site of care: Site of care: [facility] Payer: MEDICARE A/B & TRICARE Medication & CPT/J Code(s) submitted: Leqvio (Inclisiran) [id number] Route of submission (phone, fax, portal):  Phone # Fax # Auth type: Buy/Bill HB Units/visits requested: 2 DOSES Reference number:  Approval from: 02/08/23 to 02/08/24

## 2023-03-02 DIAGNOSIS — G4733 Obstructive sleep apnea (adult) (pediatric): Secondary | ICD-10-CM | POA: Diagnosis not present

## 2023-03-28 ENCOUNTER — Ambulatory Visit: Admitting: Pulmonary Disease

## 2023-03-28 DIAGNOSIS — G4733 Obstructive sleep apnea (adult) (pediatric): Secondary | ICD-10-CM | POA: Diagnosis not present

## 2023-03-31 ENCOUNTER — Encounter: Payer: Self-pay | Admitting: Cardiology

## 2023-04-01 NOTE — Telephone Encounter (Signed)
 Her BP was elevated on amlodipine 5 mg but having swelling on amlodipine 10 mg.  Would try taking 7.5 mg daily to see if keeps BP under control but see if swelling improves

## 2023-04-02 ENCOUNTER — Telehealth: Payer: Self-pay | Admitting: Primary Care

## 2023-04-02 ENCOUNTER — Encounter: Payer: Self-pay | Admitting: Internal Medicine

## 2023-04-02 DIAGNOSIS — E559 Vitamin D deficiency, unspecified: Secondary | ICD-10-CM | POA: Insufficient documentation

## 2023-04-02 DIAGNOSIS — I2510 Atherosclerotic heart disease of native coronary artery without angina pectoris: Secondary | ICD-10-CM | POA: Insufficient documentation

## 2023-04-02 NOTE — Progress Notes (Signed)
 Please let patient know HST showed patient has mild OSA with oxygen desaturations despite weight loss. Would recommend she continue to use CPAP, we can place a DME order if she needs a new machine pressure settings 5-15 cm h20

## 2023-04-02 NOTE — Addendum Note (Signed)
 Addended by: Maisie Fus on: 04/02/2023 03:15 PM   Modules accepted: Orders

## 2023-04-02 NOTE — Telephone Encounter (Signed)
 Patient is returning missed call in regards to her HST results.

## 2023-04-02 NOTE — Patient Instructions (Addendum)
      Blood work was ordered.       Medications changes include :   None    A referral was ordered and someone will call you to schedule an appointment.     Return in about 6 months (around 10/04/2023) for follow up.

## 2023-04-02 NOTE — Telephone Encounter (Signed)
 Pt informed per result note. NFN

## 2023-04-02 NOTE — Progress Notes (Unsigned)
 Subjective:    Patient ID: Chelsea Moreno, female    DOB: 03-27-1950, 73 y.o.   MRN: [id number]     HPI Carra is here for follow up of her chronic medical problems.  Finished medication for lichen planus for mouth - finished in Jan.  It cleared up in the first month - still has some soreness and swelling.  Since finishing it - it has recurred.     Getting [facility] a [facility].  Trying to increase to three times a week.  Doing yoga 2-3 times a week.   Medications and allergies reviewed with patient and updated if appropriate.  Current Outpatient Medications on File Prior to Visit  Medication Sig Dispense Refill   aspirin EC 81 MG tablet Take 1 tablet (81 mg total) by mouth daily. Swallow whole. 30 tablet 12   atorvastatin (LIPITOR) 80 MG tablet Take 1 tablet (80 mg total) by mouth daily. 90 tablet 3   Calcium Carb-Cholecalciferol (CALTRATE 600+D3) 600-20 MG-MCG TABS Take 1 tablet by mouth daily.     carvedilol (COREG) 6.25 MG tablet TAKE 1 TABLET TWICE A DAY 180 tablet 3   Cholecalciferol (VITAMIN D3) 25 MCG (1000 UT) capsule Take 2 capsules by mouth daily.     desmopressin (DDAVP NASAL) 0.01 % solution USE 1 SPRAY (10 MCG) IN EACH NOSTRIL TWICE A DAY 10 mL 13   Dorzolamide HCl-Timolol Mal PF 2-0.5 % SOLN Place 1 drop into both eyes daily.     inclisiran (LEQVIO) 284 MG/1.5ML SOSY injection Inject 284 mg into the skin every 6 (six) months.     lisinopril (ZESTRIL) 20 MG tablet Take 1 tablet (20 mg total) by mouth in the morning and at bedtime. 180 tablet 3   Ondansetron HCl (ZOFRAN PO) Take by mouth.     VYZULTA 0.024 % SOLN INSTILL 1 DROP INTO EACH EYE ONCE DAILY AT BEDTIME     No current facility-administered medications on file prior to visit.     Review of Systems  Constitutional:  Negative for fever.  Respiratory:  Negative for cough, shortness of breath and wheezing.   Cardiovascular:  Positive for leg swelling. Negative for chest pain and palpitations.   Neurological:  Negative for light-headedness and headaches.       Objective:   Vitals:   04/03/23 1259  BP: 110/60  Pulse: (!) 58  Temp: 97.6 F (36.4 C)  SpO2: 99%   BP Readings from Last 3 Encounters:  04/03/23 110/60  02/20/23 113/75  02/19/23 112/66   Wt Readings from Last 3 Encounters:  04/03/23 185 lb 12.8 oz (84.3 kg)  02/20/23 186 lb 6.4 oz (84.6 kg)  12/27/22 186 lb 12.8 oz (84.7 kg)   Body mass index is 33.98 kg/m.    Physical Exam Constitutional:      General: She is not in acute distress.    Appearance: Normal appearance.  HENT:     Head: Normocephalic and atraumatic.  Eyes:     Conjunctiva/sclera: Conjunctivae normal.  Cardiovascular:     Rate and Rhythm: Normal rate and regular rhythm.     Heart sounds: Normal heart sounds.  Pulmonary:     Effort: Pulmonary effort is normal. No respiratory distress.     Breath sounds: Normal breath sounds. No wheezing.  Musculoskeletal:     Cervical back: Neck supple.     Right lower leg: No edema.     Left lower leg: No edema.  Lymphadenopathy:  Cervical: No cervical adenopathy.  Skin:    General: Skin is warm and dry.     Findings: No rash.  Neurological:     Mental Status: She is alert. Mental status is at baseline.  Psychiatric:        Mood and Affect: Mood normal.        Behavior: Behavior normal.        Lab Results  Component Value Date   WBC 5.4 04/04/2022   HGB 12.8 04/04/2022   HCT 36.8 04/04/2022   PLT 252.0 04/04/2022   GLUCOSE 108 (H) 04/04/2022   CHOL 127 04/04/2022   TRIG 114.0 04/04/2022   HDL 44.10 04/04/2022   LDLDIRECT 171.8 03/21/2011   LDLCALC 60 04/04/2022   ALT 23 04/04/2022   AST 25 04/04/2022   NA 138 04/04/2022   K 4.3 04/04/2022   CL 102 04/04/2022   CREATININE 0.64 04/04/2022   BUN 14 04/04/2022   CO2 29 04/04/2022   TSH 0.94 04/04/2022   HGBA1C 6.0 (A) 10/03/2022   HGBA1C 6.0 10/03/2022   HGBA1C 6.0 10/03/2022   HGBA1C 6.0 10/03/2022     Assessment &  Plan:    See Problem List for Assessment and Plan of chronic medical problems.

## 2023-04-02 NOTE — Progress Notes (Signed)
 Yes heated humidity is standard but make sure to add it to the order

## 2023-04-03 ENCOUNTER — Ambulatory Visit (INDEPENDENT_AMBULATORY_CARE_PROVIDER_SITE_OTHER): Payer: Medicare Other | Admitting: Internal Medicine

## 2023-04-03 ENCOUNTER — Other Ambulatory Visit: Payer: Self-pay | Admitting: *Deleted

## 2023-04-03 VITALS — BP 110/60 | HR 58 | Temp 97.6°F | Wt 185.8 lb

## 2023-04-03 DIAGNOSIS — G4733 Obstructive sleep apnea (adult) (pediatric): Secondary | ICD-10-CM | POA: Diagnosis not present

## 2023-04-03 DIAGNOSIS — R7303 Prediabetes: Secondary | ICD-10-CM

## 2023-04-03 DIAGNOSIS — E232 Diabetes insipidus: Secondary | ICD-10-CM

## 2023-04-03 DIAGNOSIS — E785 Hyperlipidemia, unspecified: Secondary | ICD-10-CM | POA: Diagnosis not present

## 2023-04-03 DIAGNOSIS — I10 Essential (primary) hypertension: Secondary | ICD-10-CM

## 2023-04-03 DIAGNOSIS — I2510 Atherosclerotic heart disease of native coronary artery without angina pectoris: Secondary | ICD-10-CM | POA: Diagnosis not present

## 2023-04-03 DIAGNOSIS — M8588 Other specified disorders of bone density and structure, other site: Secondary | ICD-10-CM

## 2023-04-03 DIAGNOSIS — E559 Vitamin D deficiency, unspecified: Secondary | ICD-10-CM

## 2023-04-03 DIAGNOSIS — Z1211 Encounter for screening for malignant neoplasm of colon: Secondary | ICD-10-CM

## 2023-04-03 LAB — COMPREHENSIVE METABOLIC PANEL WITH GFR
ALT: 19 U/L (ref 0–35)
AST: 22 U/L (ref 0–37)
Albumin: 4.7 g/dL (ref 3.5–5.2)
Alkaline Phosphatase: 93 U/L (ref 39–117)
BUN: 9 mg/dL (ref 6–23)
CO2: 29 meq/L (ref 19–32)
Calcium: 9.7 mg/dL (ref 8.4–10.5)
Chloride: 102 meq/L (ref 96–112)
Creatinine, Ser: 0.66 mg/dL (ref 0.40–1.20)
GFR: 87.67 mL/min (ref >60.00)
Glucose, Bld: 118 mg/dL — ABNORMAL HIGH (ref 70–99)
Potassium: 4.6 meq/L (ref 3.5–5.1)
Sodium: 139 meq/L (ref 135–145)
Total Bilirubin: 0.8 mg/dL (ref 0.2–1.2)
Total Protein: 7.4 g/dL (ref 6.0–8.3)

## 2023-04-03 LAB — CBC WITH DIFFERENTIAL/PLATELET
Basophils Absolute: 0.2 10*3/uL — ABNORMAL HIGH (ref 0.0–0.1)
Basophils Relative: 3.3 % — ABNORMAL HIGH (ref 0.0–3.0)
Eosinophils Absolute: 0.1 10*3/uL (ref 0.0–0.7)
Eosinophils Relative: 2.6 % (ref 0.0–5.0)
HCT: 38 % (ref 36.0–46.0)
Hemoglobin: 13.2 g/dL (ref 12.0–15.0)
Lymphocytes Relative: 18.6 % (ref 12.0–46.0)
Lymphs Abs: 0.9 10*3/uL (ref 0.7–4.0)
MCHC: 34.7 g/dL (ref 30.0–36.0)
MCV: 92.3 fl (ref 78.0–100.0)
Monocytes Absolute: 0.5 10*3/uL (ref 0.1–1.0)
Monocytes Relative: 10.7 % (ref 3.0–12.0)
Neutro Abs: 3 10*3/uL (ref 1.4–7.7)
Neutrophils Relative %: 64.8 % (ref 43.0–77.0)
Platelets: 303 10*3/uL (ref 150.0–400.0)
RBC: 4.12 Mil/uL (ref 3.87–5.11)
RDW: 13 % (ref 11.5–15.5)
WBC: 4.7 10*3/uL (ref 4.0–10.5)

## 2023-04-03 LAB — LIPID PANEL
Cholesterol: 130 mg/dL (ref 0–200)
HDL: 46 mg/dL (ref >39.00)
LDL Cholesterol: 60 mg/dL (ref 0–99)
NonHDL: 84.28
Total CHOL/HDL Ratio: 3
Triglycerides: 121 mg/dL (ref 0.0–149.0)
VLDL: 24.2 mg/dL (ref 0.0–40.0)

## 2023-04-03 LAB — HEMOGLOBIN A1C: Hgb A1c MFr Bld: 5.7 % (ref 4.6–6.5)

## 2023-04-03 MED ORDER — AMLODIPINE BESYLATE 5 MG PO TABS: 7.5000 mg | ORAL_TABLET | Freq: Every day | ORAL | 3 refills | Status: DC

## 2023-04-03 NOTE — Assessment & Plan Note (Signed)
 Chronic Taking vitamin D daily Check vitamin D level

## 2023-04-03 NOTE — Assessment & Plan Note (Addendum)
 Chronic Moderate in severity Has not been using CPAP consistently because of lichen planus in her mouth and the CPAP drying out her mouth and causing gum bleeding Just had another sleep test and new machine ordered with warm humidifier  She will try to restart

## 2023-04-03 NOTE — Assessment & Plan Note (Signed)
 Chronic Lab Results  Component Value Date   HGBA1C 6.0 (A) 10/03/2022   HGBA1C 6.0 10/03/2022   HGBA1C 6.0 10/03/2022   HGBA1C 6.0 10/03/2022   Check A1c Low sugar / carb diet Stressed regular exercise

## 2023-04-03 NOTE — Assessment & Plan Note (Addendum)
 Chronic Following with cardiology Continue aspirin 81 mg daily, atorvastatin 80 mg daily, Levqio Q 6 months BP well-controlled, sugar controlled Stressed healthy diet and regular exercise Encouraged weight loss CBC, CMP, TSH, lipid

## 2023-04-03 NOTE — Assessment & Plan Note (Signed)
 Chronic DEXA up-to-date Stressed regular exercise Continue supplemental calcium and vitamin D Check vitamin D level

## 2023-04-03 NOTE — Assessment & Plan Note (Signed)
 Chronic Check lipids, CMP, TSH Regular exercise, healthy diet encouraged Continue atorvastatin 80 mg daily, also on Leqvio

## 2023-04-03 NOTE — Assessment & Plan Note (Addendum)
 Chronic Management per Endo On desmopressin once a day - symptoms controlled

## 2023-04-03 NOTE — Progress Notes (Signed)
 Called and made patient aware that per Dr. Bjorn Pippin to start Amlodipine 7.5mg  daily. Patient request medications to be sent to [facility] mail order. Medication order sent as requested.

## 2023-04-03 NOTE — Assessment & Plan Note (Addendum)
 Chronic Blood pressure well controlled Continue lisinopril 20 mg twice daily, amlodipine 5 mg daily, coreg 6.25 mg bid CMP, CBC

## 2023-04-05 ENCOUNTER — Ambulatory Visit: Payer: Medicare Other

## 2023-04-05 VITALS — BP 100/60 | HR 57 | Ht 61.75 in | Wt 187.0 lb

## 2023-04-05 DIAGNOSIS — Z0000 Encounter for general adult medical examination without abnormal findings: Secondary | ICD-10-CM | POA: Diagnosis not present

## 2023-04-05 LAB — TSH: TSH: 1 u[IU]/mL (ref 0.35–5.50)

## 2023-04-05 LAB — VITAMIN D 25 HYDROXY (VIT D DEFICIENCY, FRACTURES): VITD: 29.18 ng/mL — ABNORMAL LOW (ref 30.00–100.00)

## 2023-04-05 NOTE — Progress Notes (Signed)
 Subjective:   Chelsea Moreno is a 73 y.o. who presents for a Medicare Wellness preventive visit.  Visit Complete: In person   Persons Participating in Visit: Patient.  AWV Questionnaire: Yes: Patient Medicare AWV questionnaire was completed by the patient on 04/05/2023; I have confirmed that all information answered by patient is correct and no changes since this date.  Cardiac Risk Factors include: advanced age (>40men, >64 women)     Objective:    There were no vitals filed for this visit. There is no height or weight on file to calculate BMI.     04/05/2023   10:27 AM 12/10/2022    2:01 PM 04/04/2022   11:27 AM 03/27/2021    9:55 AM 03/09/2020   10:59 AM 08/10/2016   12:12 PM 05/30/2015    3:37 PM  Advanced Directives  Does Patient Have a Medical Advance Directive? Yes Yes Yes Yes Yes No No  Type of Estate agent of [place];Living will Healthcare Power of [employer] of [place];Living will Healthcare Power of [place];Living will Living will;Healthcare Power of Attorney    Does patient want to make changes to medical advance directive?     No - Patient declined    Copy of Healthcare Power of Attorney in Chart? No - copy requested  No - copy requested No - copy requested No - copy requested      Current Medications (verified) Outpatient Encounter Medications as of 04/05/2023  Medication Sig   amLODipine (NORVASC) 5 MG tablet Take 1.5 tablets (7.5 mg total) by mouth daily.   aspirin EC 81 MG tablet Take 1 tablet (81 mg total) by mouth daily. Swallow whole.   atorvastatin (LIPITOR) 80 MG tablet Take 1 tablet (80 mg total) by mouth daily.   Calcium Carb-Cholecalciferol (CALTRATE 600+D3) 600-20 MG-MCG TABS Take 1 tablet by mouth daily.   carvedilol (COREG) 6.25 MG tablet TAKE 1 TABLET TWICE A DAY   Cholecalciferol (VITAMIN D3) 25 MCG (1000 UT) capsule Take 2 capsules by mouth daily.   desmopressin (DDAVP NASAL) 0.01 % solution USE 1 SPRAY (10  MCG) IN EACH NOSTRIL TWICE A DAY   dorzolamide-timolol (COSOPT) 2-0.5 % ophthalmic solution Place 1 drop into both eyes 2 (two) times daily.   inclisiran (LEQVIO) 284 MG/1.5ML SOSY injection Inject 284 mg into the skin every 6 (six) months.   lisinopril (ZESTRIL) 20 MG tablet Take 1 tablet (20 mg total) by mouth in the morning and at bedtime.   Ondansetron HCl (ZOFRAN PO) Take by mouth.   VYZULTA 0.024 % SOLN INSTILL 1 DROP INTO EACH EYE ONCE DAILY AT BEDTIME   [DISCONTINUED] Dorzolamide HCl-Timolol Mal PF 2-0.5 % SOLN Place 1 drop into both eyes daily.   No facility-administered encounter medications on file as of 04/05/2023.    Allergies (verified) Brinzolamide-brimonidine   History: Past Medical History:  Diagnosis Date   Breast cancer (HCC) 11/05/12   left   Bursitis    COLONIC POLYPS, HX OF 09/24/2008   DEPRESSION 09/23/2007   takes Lexapro daily but hasn't taken since Sept 2014   Diabetes insipidus (HCC)    takes Desmopressin bid   Diarrhea    Dizziness    when nauseated gets dizzy   Family history of ovarian cancer    Hemorrhoids    History of bronchitis    yrs ago   History of migraine    last time many yrs ago   Hx of radiation therapy 01/29/13- 03/16/13   left  breast 4500 cGy 25 sessions, left breast boost 1000 cGy 5 sessions   HYPERLIPIDEMIA 09/06/2006   takes Atorvasatin nightly   HYPERTENSION 09/06/2006   takes Lisinopril daily   Muscle spasms of head and/or neck    OSTEOPOROSIS 09/24/2008   takes Actonel every 30days   Vitamin D deficiency    takes OTC Vit D   Past Surgical History:  Procedure Laterality Date   BREAST BIOPSY Left 30yrs ago   BREAST LUMPECTOMY WITH NEEDLE LOCALIZATION Left 12/16/2012   Procedure: PARTIAL MASTECTOMY WITH NEEDLE LOCALIZATION;  Surgeon: Almond Lint, MD;  Location: [facility] OR;  Service: General;  Laterality: Left;  1:00 NL at [facility]    CATARACT EXTRACTION Bilateral    2024   COLONOSCOPY     Family History  Problem Relation Age of  Onset   Ovarian cancer Mother    Cancer Mother        Ovarian   Heart disease Mother        Before age 74   Heart attack Mother    Varicose Veins Mother    Heart disease Father        Before age 73   Hyperlipidemia Father    Hypertension Father    Heart attack Father    Heart disease Sister        Before age 1   Hypertension Sister    Heart attack Sister 75       second heart attack at 44   Heart disease Brother        Before age 59   Hypertension Brother    Heart attack Brother 70   Cancer Maternal Aunt        GYN cancer   Heart attack Maternal Grandmother    Alcohol abuse Maternal Grandfather    Parkinson's disease Sister    ALS Brother    Social History   Socioeconomic History   Marital status: Widowed    Spouse name: Not on file   Number of children: Not on file   Years of education: Not on file   Highest education level: Doctorate  Occupational History   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  Tobacco Use   Smoking status: Former    Current packs/day: 0.00    Average packs/day: 0.5 packs/day for 4.0 years (2.0 ttl pk-yrs)    Types: Cigarettes    Start date: 01/07/1969    Quit date: 01/07/1973    Years since quitting: 50.2    Passive exposure: Never   Smokeless tobacco: Never   Tobacco comments:    quit 39yrs ago  Vaping Use   Vaping status: Never Used  Substance and Sexual Activity   Alcohol use: Not Currently   Drug use: No   Sexual activity: Not Currently    Birth control/protection: Post-menopausal    Comment: menarche age 59, first live birth age 34, menopause 2002, no HRT  Other Topics Concern   Not on file  Social History Narrative   Lives alone    Social Drivers of Health   Financial Resource Strain: Low Risk  (03/31/2023)   Overall Financial Resource Strain (CARDIA)    Difficulty of Paying Living Expenses: Not very hard  Food Insecurity: No Food Insecurity (03/31/2023)   Hunger Vital Sign    Worried About Running Out of Food in the  Last Year: Never true    Ran Out of Food in the Last Year: Never true  Transportation Needs: No Transportation Needs (03/31/2023)   PRAPARE - Transportation  Lack of Transportation (Medical): No    Lack of Transportation (Non-Medical): No  Physical Activity: Insufficiently Active (03/31/2023)   Exercise Vital Sign    Days of Exercise per Week: 3 days    Minutes of Exercise per Session: 30 min  Stress: Stress Concern Present (03/31/2023)   [employer] of Occupational Health - Occupational Stress Questionnaire    Feeling of Stress : To some extent  Social Connections: Socially Isolated (03/31/2023)   Social Connection and Isolation Panel [NHANES]    Frequency of Communication with Friends and Family: Once a week    Frequency of Social Gatherings with Friends and Family: Once a week    Attends Religious Services: Never    Database administrator or Organizations: Yes    Attends Engineer, structural: More than 4 times per year    Marital Status: Widowed    Tobacco Counseling Counseling given: Not Answered Tobacco comments: quit 66yrs ago    Clinical Intake:  Pre-visit preparation completed: Yes  Pain : No/denies pain        Lab Results  Component Value Date   HGBA1C 5.7 04/03/2023   HGBA1C 6.0 (A) 10/03/2022   HGBA1C 6.0 10/03/2022   HGBA1C 6.0 10/03/2022   HGBA1C 6.0 10/03/2022     How often do you need to have someone help you when you read instructions, pamphlets, or other written materials from your doctor or pharmacy?: 1 - Never  Interpreter Needed?: No  Information entered by :: Derrill Bagnell, RMA   Activities of Daily Living     04/05/2023   10:22 AM  In your present state of health, do you have any difficulty performing the following activities:  Hearing? 0  Vision? 0  Difficulty concentrating or making decisions? 0  Walking or climbing stairs? 0  Dressing or bathing? 0  Doing errands, shopping? 0  Preparing Food and eating ? N  Using  the Toilet? N  In the past six months, have you accidently leaked urine? N  Do you have problems with loss of bowel control? N  Managing your Medications? N  Managing your Finances? N  Housekeeping or managing your Housekeeping? N    Patient Care Team: Pincus Sanes, MD as PCP - General (Internal Medicine) Little Ishikawa, MD as PCP - Cardiology (Cardiology) Maris Berger, MD as Consulting Physician (Ophthalmology)  Indicate any recent Medical Services you may have received from other than [facility] providers in the past year (date may be approximate).     Assessment:   This is a routine wellness examination for Ivannia.  Hearing/Vision screen Hearing Screening - Comments:: Denies hearing difficulties   Vision Screening - Comments:: Wears eyeglasses   Goals Addressed             This Visit's Progress    My goal for 2024 is to continue to stay physically active in the gym.   On track      Depression Screen    04/05/2023   10:30 AM 12/10/2022    2:02 PM 04/04/2022   10:58 AM 10/04/2021   11:34 AM 03/27/2021    9:56 AM 03/27/2021    9:54 AM 03/09/2020   10:57 AM  PHQ 2/9 Scores  PHQ - 2 Score 1 0 2 2 0 0 0  PHQ- 9 Score 3  5 4        Fall Risk     04/05/2023   10:27 AM 02/20/2023   11:01 AM 12/10/2022    2:02  PM 04/04/2022   11:28 AM 04/04/2022   10:56 AM  Fall Risk   Falls in the past year? 1 1 0 0 0  Number falls in past yr: 0 0 0 0 0  Injury with Fall? 0 1  0 0  Comment bruised lt leg      Risk for fall due to :   No Fall Risks No Fall Risks No Fall Risks  Follow up Falls evaluation completed;Falls prevention discussed  Falls evaluation completed Falls prevention discussed Falls evaluation completed    MEDICARE RISK AT HOME:  Medicare Risk at Home Any stairs in or around the home?: Yes (outside on side door) If so, are there any without handrails?: Yes Home free of loose throw rugs in walkways, pet beds, electrical cords, etc?: Yes Adequate lighting in  your home to reduce risk of falls?: Yes Life alert?: No Use of a cane, walker or w/c?: No Grab bars in the bathroom?: Yes Shower chair or bench in shower?: Yes Elevated toilet seat or a handicapped toilet?: Yes  TIMED UP AND GO:  Was the test performed?  Yes  Length of time to ambulate 10 feet: 15 sec Gait steady and fast without use of assistive device  Cognitive Function: Normal: Normal cognitive status assessed by direct observation by this Clinical Health Advisor. No abnormalities found. Patient is able to answer questions in an accurate and timely manner.        04/04/2022   11:29 AM  6CIT Screen  What Year? 0 points  What month? 0 points  What time? 0 points  Count back from 20 0 points  Months in reverse 0 points  Repeat phrase 0 points  Total Score 0 points    Immunizations Immunization History  Administered Date(s) Administered   Fluad Quad(high Dose 65+) 09/10/2018, 09/28/2019, 09/28/2020, 12/04/2021   Fluad Trivalent(High Dose 65+) 10/03/2022   Influenza Split 09/25/2010, 11/16/2011   Influenza Whole 09/26/2009   Influenza, High Dose Seasonal PF 01/15/2017   Influenza,inj,Quad PF,6+ Mos 11/09/2015   Influenza,inj,quad, With Preservative 10/08/2017   PFIZER(Purple Top)SARS-COV-2 Vaccination 02/15/2019, 03/11/2019, 10/19/2019   Pneumococcal Conjugate-13 07/17/2017   Pneumococcal Polysaccharide-23 09/10/2018   Td 09/24/2008    Screening Tests Health Maintenance  Topic Date Due   Colonoscopy  03/07/2023   Medicare Annual Wellness (AWV)  04/04/2023   COVID-19 Vaccine (4 - 2024-25 season) 04/19/2023 (Originally 09/09/2022)   Zoster Vaccines- Shingrix (1 of 2) 07/04/2023 (Originally 11/21/1969)   DTaP/Tdap/Td (2 - Tdap) 04/02/2024 (Originally 09/25/2018)   MAMMOGRAM  11/16/2023   DEXA SCAN  12/14/2023   Pneumonia Vaccine 74+ Years old  Completed   INFLUENZA VACCINE  Completed   Hepatitis C Screening  Completed   HPV VACCINES  Aged Out    Health  Maintenance  Health Maintenance Due  Topic Date Due   Colonoscopy  03/07/2023   Medicare Annual Wellness (AWV)  04/04/2023   Health Maintenance Items Addressed: See Nurse Notes  Additional Screening:  Vision Screening: Recommended annual ophthalmology exams for early detection of glaucoma and other disorders of the eye.  Dental Screening: Recommended annual dental exams for proper oral hygiene  Community Resource Referral / Chronic Care Management: CRR required this visit?  No   CCM required this visit?  No     Plan:     I have personally reviewed and noted the following in the patient's chart:   Medical and social history Use of alcohol, tobacco or illicit drugs  Current medications and supplements  including opioid prescriptions. Patient is not currently taking opioid prescriptions. Functional ability and status Nutritional status Physical activity Advanced directives List of other physicians Hospitalizations, surgeries, and ER visits in previous 12 months Vitals Screenings to include cognitive, depression, and falls Referrals and appointments  In addition, I have reviewed and discussed with patient certain preventive protocols, quality metrics, and best practice recommendations. A written personalized care plan for preventive services as well as general preventive health recommendations were provided to patient.     Velita Quirk L Gwin Eagon, CMA   04/05/2023   After Visit Summary: (MyChart) Due to this being a telephonic visit, the after visit summary with patients personalized plan was offered to patient via MyChart   Notes: Please refer to Routing Comments.

## 2023-04-05 NOTE — Patient Instructions (Signed)
 Chelsea Moreno , Thank you for taking time to come for your Medicare Wellness Visit. I appreciate your ongoing commitment to your health goals. Please review the following plan we discussed and let me know if I can assist you in the future.   Referrals/Orders/Follow-Ups/Clinician Recommendations: It was nice to meet you today.  Please call [facility], at [phone], to schedule an appointment for a colonoscopy.    This is a list of the screening recommended for you and due dates:  Health Maintenance  Topic Date Due   Colon Cancer Screening  03/07/2023   COVID-19 Vaccine (4 - 2024-25 season) 04/19/2023*   Zoster (Shingles) Vaccine (1 of 2) 07/04/2023*   DTaP/Tdap/Td vaccine (2 - Tdap) 04/02/2024*   Mammogram  11/16/2023   DEXA scan (bone density measurement)  12/14/2023   Medicare Annual Wellness Visit  04/04/2024   Pneumonia Vaccine  Completed   Flu Shot  Completed   Hepatitis C Screening  Completed   HPV Vaccine  Aged Out  *Topic was postponed. The date shown is not the original due date.    Advanced directives: (Copy Requested) Please bring a copy of your health care power of attorney and living will to the office to be added to your chart at your convenience. You can mail to [facility] [address] [place], [place] [address] or email to [email or web address]  Next Medicare Annual Wellness Visit scheduled for next year: Yes

## 2023-04-07 ENCOUNTER — Encounter: Payer: Self-pay | Admitting: Internal Medicine

## 2023-04-11 ENCOUNTER — Encounter: Payer: Self-pay | Admitting: Gastroenterology

## 2023-04-12 ENCOUNTER — Telehealth: Payer: Self-pay

## 2023-04-12 NOTE — Telephone Encounter (Signed)
 I was unable to find a compliance report for pt for her appointment with Buelah Manis, NP on Monday, 04-15-23. I called [facility] and it was stated that the pt received her CPAP yesterday (04-11-23) so she would not have her compliance report yet.

## 2023-04-15 ENCOUNTER — Ambulatory Visit (INDEPENDENT_AMBULATORY_CARE_PROVIDER_SITE_OTHER): Payer: Medicare Other | Admitting: Primary Care

## 2023-04-15 ENCOUNTER — Encounter: Payer: Self-pay | Admitting: Primary Care

## 2023-04-15 VITALS — BP 116/69 | HR 54 | Temp 96.6°F | Ht 62.0 in | Wt 188.2 lb

## 2023-04-15 DIAGNOSIS — G4733 Obstructive sleep apnea (adult) (pediatric): Secondary | ICD-10-CM | POA: Diagnosis not present

## 2023-04-15 NOTE — Progress Notes (Signed)
 @Patient  ID: Chelsea Moreno, female    DOB: 10/13/1950, 73 y.o.   MRN: [id number]  Chief Complaint  Patient presents with   Follow-up    OSA F/U. Received CPAP on Thursday, 04-11-23     Referring provider: Pincus Sanes, MD  HPI: 73 year old female, former smoker.  Past medical history significant for hypertension, OSA, prediabetes, dyslipidemia, history of breast cancer. Former Dr. Craige Cotta patient.   Previous [facility] pulmonary encounter:  02/20/2023 Discussed the use of AI scribe software for clinical note transcription with the patient, who gave verbal consent to proceed.  History of Present Illness   Chelsea Moreno is a 73 year old female with moderate sleep apnea who presents for re-evaluation of her condition.  Patient had a home sleep study in August 2016 with Dr. Craige Cotta that showed evidence of moderate obstructive sleep apnea, AHI 17.8 with SpO2 low 85%. Previously on auto CPAP. She experiences some intermittent aerophagia with bloating, it was recommended if worsening to consider changing mask or adjusting pressure settings.   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 Due to these complications, she discontinued CPAP use over the past year, particularly during a clinical trial she participated in for the last six months.  She has oral lichen planus, an autoimmune condition causing mouth ulcers. The CPAP machine exacerbated these ulcers by drying out her mouth, leading to bleeding. She recently completed a six-month course of oral medication for this condition, which resolved the ulcers but left residual swelling and soreness.   Regarding her sleep, she experiences frequent awakenings during the night, sometimes waking with a startle and being unable to return to sleep. She describes restless sleep and daytime fatigue, with episodes of extreme exhaustion affecting her daily activities. No loud  snoring, but there is a history of snoring as reported by her husband. She does not take any sleep aids regularly, having tried melatonin and Benadryl only once or twice, which affected her dreams.  She has experienced difficulties with blood pressure control, which her cardiologist has addressed by adding and increasing medication. He would like her to get back on CPAP if clinically indicated.   She notes a weight loss of 20 pounds over the past year, attributed to intentional lifestyle changes including starting an exercise regimen in January and dietary restrictions due to her oral condition.      04/15/2023- interim hx  Discussed the use of AI scribe software for clinical note transcription with the patient, who gave verbal consent to proceed.  History of Present Illness   Chelsea Moreno is a 73 year old female with moderate sleep apnea who presents for reevaluation.  She has a history of moderate obstructive sleep apnea, initially diagnosed in August 2016 with a sleep study showing an apnea-hypopnea index (AHI) of 17.8. She was previously managed with an auto CPAP machine but discontinued its use due to complications with an autoimmune condition causing mouth ulcers, which were exacerbated by the CPAP machine drying out her mouth and leading to bleeding. A recent sleep study at the end of February 2025 showed improvement in her sleep apnea, now categorized as mild, with an AHI of 10.9. Her oxygen saturation baseline was 97%, with a low of 80%, and she spent about 7 minutes or 2% of her sleep time with oxygen levels below 88%.  She has resumed using a CPAP machine with a nasal mask but finds it  an adjustment, particularly as a stomach sleeper. She experiences nighttime awakenings, restless sleep, daytime fatigue, and exhaustion, but no loud snoring, although her husband has reported a history of snoring. She does not take any sleep aids regularly but has tried melatonin and Benadryl, which affected  her dreams.  Over the past year, she participated in a clinical trial for six months, which helped manage her autoimmune condition. However, she is currently off the medication from the trial, and the ulcers have returned.  She has lost 20 pounds over the past year due to lifestyle changes, diet, and exercise, which has contributed to the improvement in her sleep apnea. However, she is not yet at her goal weight and has struggled with maintaining her exercise routine, currently managing to go to the gym twice a week. She experiences fatigue during resistance training, limiting her sessions to 15 minutes after 30 minutes on the treadmill.       Allergies  Allergen Reactions   Brinzolamide-Brimonidine Dermatitis, Other (See Comments) and Rash    Immunization History  Administered Date(s) Administered   Fluad Quad(high Dose 65+) 09/10/2018, 09/28/2019, 09/28/2020, 12/04/2021   Fluad Trivalent(High Dose 65+) 10/03/2022   Influenza Split 09/25/2010, 11/16/2011   Influenza Whole 09/26/2009   Influenza, High Dose Seasonal PF 01/15/2017   Influenza,inj,Quad PF,6+ Mos 11/09/2015   Influenza,inj,quad, With Preservative 10/08/2017   PFIZER(Purple Top)SARS-COV-2 Vaccination 02/15/2019, 03/11/2019, 10/19/2019   Pneumococcal Conjugate-13 07/17/2017   Pneumococcal Polysaccharide-23 09/10/2018   Td 09/24/2008    Past Medical History:  Diagnosis Date   Breast cancer (HCC) 11/05/12   left   Bursitis    COLONIC POLYPS, HX OF 09/24/2008   DEPRESSION 09/23/2007   takes Lexapro daily but hasn't taken since Sept 2014   Diabetes insipidus (HCC)    takes Desmopressin bid   Diarrhea    Dizziness    when nauseated gets dizzy   Family history of ovarian cancer    Hemorrhoids    History of bronchitis    yrs ago   History of migraine    last time many yrs ago   Hx of radiation therapy 01/29/13- 03/16/13   left breast 4500 cGy 25 sessions, left breast boost 1000 cGy 5 sessions   HYPERLIPIDEMIA 09/06/2006    takes Atorvasatin nightly   HYPERTENSION 09/06/2006   takes Lisinopril daily   Muscle spasms of head and/or neck    OSTEOPOROSIS 09/24/2008   takes Actonel every 30days   Vitamin D deficiency    takes OTC Vit D    Tobacco History: Social History   Tobacco Use  Smoking Status Former   Current packs/day: 0.00   Average packs/day: 0.5 packs/day for 4.0 years (2.0 ttl pk-yrs)   Types: Cigarettes   Start date: 01/07/1969   Quit date: 01/07/1973   Years since quitting: 50.3   Passive exposure: Never  Smokeless Tobacco Never  Tobacco Comments   quit 69yrs ago   Counseling given: Not Answered Tobacco comments: quit 64yrs ago   Outpatient Medications Prior to Visit  Medication Sig Dispense Refill   amLODipine (NORVASC) 5 MG tablet Take 1.5 tablets (7.5 mg total) by mouth daily. 90 tablet 3   aspirin EC 81 MG tablet Take 1 tablet (81 mg total) by mouth daily. Swallow whole. 30 tablet 12   atorvastatin (LIPITOR) 80 MG tablet Take 1 tablet (80 mg total) by mouth daily. 90 tablet 3   Calcium Carb-Cholecalciferol (CALTRATE 600+D3) 600-20 MG-MCG TABS Take 1 tablet by mouth daily.  carvedilol (COREG) 6.25 MG tablet TAKE 1 TABLET TWICE A DAY 180 tablet 3   Cholecalciferol (VITAMIN D3) 25 MCG (1000 UT) capsule Take 2 capsules by mouth daily.     desmopressin (DDAVP NASAL) 0.01 % solution USE 1 SPRAY (10 MCG) IN EACH NOSTRIL TWICE A DAY 10 mL 13   dorzolamide-timolol (COSOPT) 2-0.5 % ophthalmic solution Place 1 drop into both eyes 2 (two) times daily.     inclisiran (LEQVIO) 284 MG/1.5ML SOSY injection Inject 284 mg into the skin every 6 (six) months.     Ondansetron HCl (ZOFRAN PO) Take by mouth.     VYZULTA 0.024 % SOLN INSTILL 1 DROP INTO EACH EYE ONCE DAILY AT BEDTIME     lisinopril (ZESTRIL) 20 MG tablet Take 1 tablet (20 mg total) by mouth in the morning and at bedtime. 180 tablet 3   No facility-administered medications prior to visit.    Review of Systems  Review of Systems   Constitutional: Negative.   HENT: Negative.    Respiratory: Negative.    Cardiovascular: Negative.   Skin:        Oral ulcers   Psychiatric/Behavioral:  Positive for sleep disturbance.    Physical Exam  BP 116/69 (BP Location: Left Arm, Patient Position: Sitting, Cuff Size: Large)   Pulse (!) 54   Temp (!) 96.6 F (35.9 C) (Temporal)   Ht 5\' 2"  (1.575 m)   Wt 188 lb 3.2 oz (85.4 kg)   SpO2 98%   BMI 34.42 kg/m  Physical Exam Constitutional:      Appearance: Normal appearance.  HENT:     Head: Normocephalic and atraumatic.  Cardiovascular:     Rate and Rhythm: Normal rate and regular rhythm.  Pulmonary:     Effort: Pulmonary effort is normal.     Breath sounds: No wheezing or rhonchi.  Skin:    General: Skin is warm and dry.  Neurological:     General: No focal deficit present.     Mental Status: She is alert and oriented to person, place, and time. Mental status is at baseline.  Psychiatric:        Mood and Affect: Mood normal.        Behavior: Behavior normal.        Thought Content: Thought content normal.        Judgment: Judgment normal.      Lab Results:  CBC    Component Value Date/Time   WBC 4.7 04/03/2023 1341   RBC 4.12 04/03/2023 1341   HGB 13.2 04/03/2023 1341   HGB 13.8 05/20/2014 1412   HCT 38.0 04/03/2023 1341   HCT 38.8 05/20/2014 1412   PLT 303.0 04/03/2023 1341   PLT 236 05/20/2014 1412   MCV 92.3 04/03/2023 1341   MCV 88.2 05/20/2014 1412   MCH 31.4 05/20/2014 1412   MCH 31.8 12/10/2012 0935   MCHC 34.7 04/03/2023 1341   RDW 13.0 04/03/2023 1341   RDW 12.7 05/20/2014 1412   LYMPHSABS 0.9 04/03/2023 1341   LYMPHSABS 1.2 05/20/2014 1412   MONOABS 0.5 04/03/2023 1341   MONOABS 0.6 05/20/2014 1412   EOSABS 0.1 04/03/2023 1341   EOSABS 0.1 05/20/2014 1412   BASOSABS 0.2 (H) 04/03/2023 1341   BASOSABS 0.0 05/20/2014 1412    BMET    Component Value Date/Time   NA 139 04/03/2023 1341   NA 143 11/18/2020 1029   NA 140 05/20/2014  1413   K 4.6 04/03/2023 1341   K 4.2 05/20/2014  1413   CL 102 04/03/2023 1341   CO2 29 04/03/2023 1341   CO2 24 05/20/2014 1413   GLUCOSE 118 (H) 04/03/2023 1341   GLUCOSE 111 05/20/2014 1413   BUN 9 04/03/2023 1341   BUN 13 11/18/2020 1029   BUN 9.0 05/20/2014 1413   CREATININE 0.66 04/03/2023 1341   CREATININE 0.69 09/28/2019 1156   CREATININE 0.7 05/20/2014 1413   CALCIUM 9.7 04/03/2023 1341   CALCIUM 9.1 05/20/2014 1413   GFRNONAA 89 09/28/2019 1156   GFRAA 104 09/28/2019 1156    BNP No results found for: "BNP"  ProBNP No results found for: "PROBNP"  Imaging: No results found.   Assessment & Plan:   1. OSA (obstructive sleep apnea) (Primary) - Ambulatory Referral for DME   Assessment and Plan    Obstructive Sleep Apnea Moderate OSA improved to mild severity (AHI of 10.9/hr) with weight loss. CPAP therapy resumed but causing discomfort and air leakage. Alternative mask options and CPAP pillow discussed. Oral appliance and Inspire device not pursued due to mild severity and oral ulcers.  - Recommend ResMed Airfit P30i nasal mask with tubing at the top of the head. - Consider using a CPAP pillow for stomach sleepers. - Order ResMed Airfit P30i nasal mask through [facility]. - Check compliance report in 6 weeks.  Autoimmune Condition with Oral Ulcers Autoimmune condition causing mouth ulcers. Ulcers returned post-clinical trial medication. Awaiting specialist re-evaluation for potential prescription.  Weight Management 20-pound weight loss due to lifestyle changes. Decreased exercise motivation. Discussed importance of weight-bearing exercises for bone health and muscle mass. - Encourage gradual increase in exercise frequency and duration. - Consider exploring new exercise environments or classes for motivation, recommend [employer] with [employer] [facility]     Glenford Bayley, NP 04/15/2023

## 2023-04-15 NOTE — Patient Instructions (Addendum)
 YOUR PLAN: -OBSTRUCTIVE SLEEP APNEA: Obstructive sleep apnea is a condition where your breathing stops and starts during sleep due to blocked airways. Your condition has improved from moderate to mild with weight loss. We recommend trying the ResMed Airfit P30i nasal mask with tubing at the top of the head and using a CPAP pillow to help with comfort. We will order the mask through [facility] and check your compliance report in 6 weeks.  -AUTOIMMUNE CONDITION WITH ORAL ULCERS: Your autoimmune condition is causing mouth ulcers, which have returned after stopping the clinical trial medication. Follow up with your specialist at the end of April for a re-evaluation and potential prescription approval.  -WEIGHT MANAGEMENT: You have lost 20 pounds through lifestyle changes, diet, and exercise, which has helped improve your sleep apnea. We discussed the importance of gradually increasing your exercise frequency and duration, and exploring new exercise environments or classes to stay motivated.  INSTRUCTIONS: We will check your CPAP compliance report in 6 weeks. Please follow up with your specialist at the end of April for re-evaluation of your autoimmune condition and potential prescription approval.  Orders: DME order- please provide patient with resmed P30i nasal pillow mask   Follow-up: 31-90 days for CPAP compliance (can be virtual if patient lives in [place] or in person)      CPAP and BIPAP Information CPAP and BIPAP use air pressure to keep your airways open and help you breathe well. CPAP and BIPAP use different amounts of pressure. Your health care provider will tell you whether CPAP or BIPAP would be best for you. CPAP stands for continuous positive airway pressure. With CPAP, the amount of pressure stays the same while you breathe in and out. BIPAP stands for bi-level positive airway pressure. With BIPAP, the amount of pressure will be higher when you breathe in and lower when you breathe  out. This allows you to take bigger breaths. CPAP or BIPAP may be used in the hospital or at home. You may need to have a sleep study before your provider can order a device for you to use at home. What are the advantages? CPAP and BIPAP are most often used for obstructive sleep apnea to keep the airways from collapsing when the muscles relax during sleep. CPAP or BIPAP can be used if you have: Chronic obstructive pulmonary disease. Heart failure. Medical conditions that cause muscle weakness. Other problems that cause breathing to be shallow, weak, or difficult. What are the risks? Your provider will talk with you about risks. These may include: Sores on your nose or face caused from the mask, prongs, or nasal pillows. Dry or stuffy nose or nosebleeds. Feeling gassy or bloated. Sinus or lung infection if the equipment is not cleaned well. When should CPAP or BIPAP be used? In most cases, CPAP or BIPAP is used during sleep at night or whenever the main sleep time happens. It's also used during naps. People with some medical conditions may need to wear the mask when they're awake. Follow instructions from your provider about when to use your CPAP or BIPAP. What happens during CPAP or BIPAP?  Both CPAP and BIPAP use a small machine that uses electricity to create air pressure. A long tube connects the device to a plastic mask. Air is blown through the mask into your nose or mouth. The amount of pressure that's used to blow the air can be adjusted. Your provider will set the pressure setting and help you find the best mask  for you. Tips for using the mask There are different types and sizes of masks. If your mask does not fit well, talk with your provider about getting a different one. Some common types of masks include: Full face masks, which fit over the mouth and nose. Nasal masks, which fit over the nose. Nasal pillow or prong masks, which fit into the nostrils. The mask needs to be snug to  your face, so some people feel trapped or closed in at first. If you feel this way, you may need to get used to the mask. Hold the mask loosely over your nose or mouth and then gradually put the the mask on more snugly. Slowly increase the amount of time you use the mask. If you have trouble with your mask not fitting well or leaking, talk with your provider. Do not stop using the mask. Tips for using the device Follow instructions from your provider about how to and how often to use the device. For home use, CPAP and BIPAP devices come from home health care companies. There are many different brands. Your health insurance company will help to decide which device you get. Keep the CPAP or BIPAP device and attachments clean. Ask your home health care company or check the instruction book for cleaning instructions. Make sure the humidifier is filled with germ-free (sterile) water and is working correctly. This will help prevent a dry or stuffy nose or nosebleeds. A nasal saline mist or spray may keep your nose from getting dry and sore. Do not eat or drink while the CPAP or BIPAP device is on. Food or drinks could get pushed into your lungs by the pressure of the CPAP or BIPAP. Follow these instructions at home: Take over-the-counter and prescription medicines only as told by your provider. Do not smoke, vape, or use nicotine or tobacco. Contact a health care provider if: You have redness or pressure sores on your head, face, mouth, or nose from the mask or headgear. You have trouble using the CPAP or BIPAP device. You have trouble going to sleep or staying asleep. Someone tells you that you snore even when wearing your CPAP or BIPAP device. Get help right away if: You have trouble breathing. You feel confused. These symptoms may be an emergency. Get help right away. Call 911. Do not wait to see if the symptoms will go away. Do not drive yourself to the hospital. This information is not  intended to replace advice given to you by your health care provider. Make sure you discuss any questions you have with your health care provider. Document Revised: 04/18/2022 Document Reviewed: 04/18/2022 Elsevier Patient Education  2024 [employer].

## 2023-05-02 DIAGNOSIS — L438 Other lichen planus: Secondary | ICD-10-CM | POA: Diagnosis not present

## 2023-05-03 ENCOUNTER — Encounter

## 2023-05-10 DIAGNOSIS — Z79899 Other long term (current) drug therapy: Secondary | ICD-10-CM | POA: Diagnosis not present

## 2023-05-10 DIAGNOSIS — C441991 Other specified malignant neoplasm of skin of left upper eyelid, including canthus: Secondary | ICD-10-CM | POA: Diagnosis not present

## 2023-05-10 DIAGNOSIS — I10 Essential (primary) hypertension: Secondary | ICD-10-CM | POA: Diagnosis not present

## 2023-05-10 DIAGNOSIS — Z6834 Body mass index (BMI) 34.0-34.9, adult: Secondary | ICD-10-CM | POA: Diagnosis not present

## 2023-05-10 DIAGNOSIS — H02824 Cysts of left upper eyelid: Secondary | ICD-10-CM | POA: Diagnosis not present

## 2023-05-10 DIAGNOSIS — Z87891 Personal history of nicotine dependence: Secondary | ICD-10-CM | POA: Diagnosis not present

## 2023-05-10 DIAGNOSIS — E66811 Obesity, class 1: Secondary | ICD-10-CM | POA: Diagnosis not present

## 2023-05-24 ENCOUNTER — Encounter: Admitting: Gastroenterology

## 2023-06-07 ENCOUNTER — Telehealth: Payer: Self-pay

## 2023-06-07 NOTE — Telephone Encounter (Signed)
 Pt is scheduled to see Irby Mannan, NP on 06-10-23 for CPAP f/u. I was unable to find a download on [employer]. I called [facility] and spoke with Christian Cowden. Kia stated she would fax a compliance report to me with my ATTN and I gave her [employer] fax number. Will await for fax.

## 2023-06-10 ENCOUNTER — Ambulatory Visit (INDEPENDENT_AMBULATORY_CARE_PROVIDER_SITE_OTHER): Admitting: Primary Care

## 2023-06-10 ENCOUNTER — Encounter: Payer: Self-pay | Admitting: Primary Care

## 2023-06-10 VITALS — BP 122/59 | HR 56 | Temp 97.5°F | Ht 62.0 in | Wt 190.6 lb

## 2023-06-10 DIAGNOSIS — Z87891 Personal history of nicotine dependence: Secondary | ICD-10-CM

## 2023-06-10 DIAGNOSIS — J0100 Acute maxillary sinusitis, unspecified: Secondary | ICD-10-CM | POA: Diagnosis not present

## 2023-06-10 DIAGNOSIS — G4733 Obstructive sleep apnea (adult) (pediatric): Secondary | ICD-10-CM | POA: Diagnosis not present

## 2023-06-10 MED ORDER — AMOXICILLIN-POT CLAVULANATE 875-125 MG PO TABS: 1.0000 | ORAL_TABLET | Freq: Two times a day (BID) | ORAL | 0 refills | Status: DC

## 2023-06-10 NOTE — Progress Notes (Signed)
 @Patient  ID: Chelsea Moreno, female    DOB: August 02, 1950, 73 y.o.   MRN: [id number]  Chief Complaint  Patient presents with   Follow-up    [facility]A F/U    Referring provider: Colene Dauphin, MD  HPI: 73 year old female, former smoker.  Past medical history significant for hypertension, OSA, prediabetes, dyslipidemia, history of breast cancer. Former Dr. Matilde Son patient.  06/10/2023 Discussed the use of AI scribe software for clinical note transcription with the patient, who gave verbal consent to proceed.  History of Present Illness   Chelsea Moreno is a 73 year old female with moderate sleep apnea who presents for a follow-up regarding her CPAP machine.  She was fitted with a new CPAP mask and uses a nasal pillow. She is unsure of the humidification settings on her CPAP machine but confirms using water with it.  She experiences 'really heavy, thick and dark phlegm' in her mouth upon waking, which she attributes to sinus drainage. This symptom has been present for about six months and is described as a dark brown color, but not bloody. This issue seems to be related to the use of the CPAP machine, as it was not present during a period when she did not use the machine due to eye surgery. No heaviness or pressure in her sinuses but reports constant sinus drainage, which is clear during the day.  Her past medical history includes oral ulcers, which have returned after the conclusion of a clinical trial she was participating in. She is currently under the care of a dermatologist for this condition.      Airview download 04/08/23-06/06/23 Usage days 55/60 (92%); 52 DAYS (87%) > 4 hours Average usage 5 hours 43 mins Pressure 5-15cm h20 Airleaks 36L/min AHI 0.5   Allergies  Allergen Reactions   Brinzolamide-Brimonidine Dermatitis, Other (See Comments) and Rash    Immunization History  Administered Date(s) Administered   Fluad Quad(high Dose 65+) 09/10/2018, 09/28/2019, 09/28/2020, 12/04/2021    Fluad Trivalent(High Dose 65+) 10/03/2022   Influenza Split 09/25/2010, 11/16/2011   Influenza Whole 09/26/2009   Influenza, High Dose Seasonal PF 01/15/2017   Influenza,inj,Quad PF,6+ Mos 11/09/2015   Influenza,inj,quad, With Preservative 10/08/2017   PFIZER(Purple Top)SARS-COV-2 Vaccination 02/15/2019, 03/11/2019, 10/19/2019   Pneumococcal Conjugate-13 07/17/2017   Pneumococcal Polysaccharide-23 09/10/2018   Td 09/24/2008    Past Medical History:  Diagnosis Date   Breast cancer (HCC) 11/05/12   left   Bursitis    COLONIC POLYPS, HX OF 09/24/2008   DEPRESSION 09/23/2007   takes Lexapro  daily but hasn't taken since Sept 2014   Diabetes insipidus (HCC)    takes Desmopressin  bid   Diarrhea    Dizziness    when nauseated gets dizzy   Family history of ovarian cancer    Hemorrhoids    History of bronchitis    yrs ago   History of migraine    last time many yrs ago   Hx of radiation therapy 01/29/13- 03/16/13   left breast 4500 cGy 25 sessions, left breast boost 1000 cGy 5 sessions   HYPERLIPIDEMIA 09/06/2006   takes Atorvasatin nightly   HYPERTENSION 09/06/2006   takes Lisinopril  daily   Muscle spasms of head and/or neck    OSTEOPOROSIS 09/24/2008   takes Actonel  every 30days   Vitamin D  deficiency    takes OTC Vit D    Tobacco History: Social History   Tobacco Use  Smoking Status Former   Current packs/day: 0.00   Average packs/day: 0.5 packs/day  for 4.0 years (2.0 ttl pk-yrs)   Types: Cigarettes   Start date: 01/07/1969   Quit date: 01/07/1973   Years since quitting: 50.4   Passive exposure: Never  Smokeless Tobacco Never  Tobacco Comments   quit 26yrs ago   Counseling given: Not Answered Tobacco comments: quit 64yrs ago   Outpatient Medications Prior to Visit  Medication Sig Dispense Refill   amLODipine  (NORVASC ) 5 MG tablet Take 1.5 tablets (7.5 mg total) by mouth daily. 90 tablet 3   aspirin  EC 81 MG tablet Take 1 tablet (81 mg total) by mouth daily.  Swallow whole. 30 tablet 12   atorvastatin  (LIPITOR) 80 MG tablet Take 1 tablet (80 mg total) by mouth daily. 90 tablet 3   Calcium  Carb-Cholecalciferol (CALTRATE 600+D3) 600-20 MG-MCG TABS Take 1 tablet by mouth daily.     carvedilol  (COREG ) 6.25 MG tablet TAKE 1 TABLET TWICE A DAY 180 tablet 3   Cholecalciferol (VITAMIN D3) 25 MCG (1000 UT) capsule Take 3 capsules by mouth daily.     desmopressin  (DDAVP  NASAL) 0.01 % solution USE 1 SPRAY (10 MCG) IN EACH NOSTRIL TWICE A DAY 10 mL 13   dorzolamide-timolol  (COSOPT) 2-0.5 % ophthalmic solution Place 1 drop into both eyes 2 (two) times daily.     inclisiran (LEQVIO ) 284 MG/1.5ML SOSY injection Inject 284 mg into the skin every 6 (six) months.     Ondansetron  HCl (ZOFRAN  PO) Take by mouth.     VYZULTA  0.024 % SOLN INSTILL 1 DROP INTO EACH EYE ONCE DAILY AT BEDTIME     lisinopril  (ZESTRIL ) 20 MG tablet Take 1 tablet (20 mg total) by mouth in the morning and at bedtime. 180 tablet 3   No facility-administered medications prior to visit.      Review of Systems  Review of Systems  Constitutional: Negative.   HENT:  Positive for congestion and postnasal drip.   Respiratory: Negative.     Physical Exam  BP (!) 122/59 (BP Location: Left Arm, Patient Position: Sitting, Cuff Size: Normal)   Pulse (!) 56   Temp (!) 97.5 F (36.4 C) (Temporal)   Ht 5\' 2"  (1.575 m)   Wt 190 lb 9.6 oz (86.5 kg)   SpO2 98%   BMI 34.86 kg/m  Physical Exam Constitutional:      Appearance: Normal appearance. She is not ill-appearing.  HENT:     Head: Normocephalic and atraumatic.     Mouth/Throat:     Mouth: Mucous membranes are moist.     Pharynx: Oropharynx is clear.  Cardiovascular:     Rate and Rhythm: Normal rate.  Pulmonary:     Effort: Pulmonary effort is normal.     Breath sounds: Normal breath sounds. No wheezing, rhonchi or rales.  Neurological:     General: No focal deficit present.     Mental Status: She is alert and oriented to person,  place, and time. Mental status is at baseline.  Psychiatric:        Mood and Affect: Mood normal.        Behavior: Behavior normal.        Thought Content: Thought content normal.        Judgment: Judgment normal.      Lab Results:  CBC    Component Value Date/Time   WBC 4.7 04/03/2023 1341   RBC 4.12 04/03/2023 1341   HGB 13.2 04/03/2023 1341   HGB 13.8 05/20/2014 1412   HCT 38.0 04/03/2023 1341   HCT 38.8  05/20/2014 1412   PLT 303.0 04/03/2023 1341   PLT 236 05/20/2014 1412   MCV 92.3 04/03/2023 1341   MCV 88.2 05/20/2014 1412   MCH 31.4 05/20/2014 1412   MCH 31.8 12/10/2012 0935   MCHC 34.7 04/03/2023 1341   RDW 13.0 04/03/2023 1341   RDW 12.7 05/20/2014 1412   LYMPHSABS 0.9 04/03/2023 1341   LYMPHSABS 1.2 05/20/2014 1412   MONOABS 0.5 04/03/2023 1341   MONOABS 0.6 05/20/2014 1412   EOSABS 0.1 04/03/2023 1341   EOSABS 0.1 05/20/2014 1412   BASOSABS 0.2 (H) 04/03/2023 1341   BASOSABS 0.0 05/20/2014 1412    BMET    Component Value Date/Time   NA 139 04/03/2023 1341   NA 143 11/18/2020 1029   NA 140 05/20/2014 1413   K 4.6 04/03/2023 1341   K 4.2 05/20/2014 1413   CL 102 04/03/2023 1341   CO2 29 04/03/2023 1341   CO2 24 05/20/2014 1413   GLUCOSE 118 (H) 04/03/2023 1341   GLUCOSE 111 05/20/2014 1413   BUN 9 04/03/2023 1341   BUN 13 11/18/2020 1029   BUN 9.0 05/20/2014 1413   CREATININE 0.66 04/03/2023 1341   CREATININE 0.69 09/28/2019 1156   CREATININE 0.7 05/20/2014 1413   CALCIUM  9.7 04/03/2023 1341   CALCIUM  9.1 05/20/2014 1413   GFRNONAA 89 09/28/2019 1156   GFRAA 104 09/28/2019 1156    BNP No results found for: "BNP"  ProBNP No results found for: "PROBNP"  Imaging: No results found.   Assessment & Plan:   1. OSA (obstructive sleep apnea) (Primary)  2. Subacute maxillary sinusitis  Assessment and Plan    Moderate sleep apnea Moderate sleep apnea is well-controlled with CPAP therapy. Compliance is at 92% with an average usage of 5.5  to 6 hours per night. Pressure settings are 5 to 15, and the apnea score is 0.5, indicating less than one event per hour. No residual apneic events or significant air leaks are present. - Continue current CPAP therapy nightly - Follow up in one year if symptoms remain stable  Subacute maxillary sinusitis Acute on chronic sinusitis with symptoms of thick, dark phlegm in the morning, likely related to sinus drainage. Symptoms have persisted for approximately six months. The CPAP machine may exacerbate the condition but is not the direct cause. The purulent color and thickness of the mucus suggest a possible bacterial infection  - Prescribe a course of antibiotics, Augmentin , to treat sinusitis. - Recommend using plain Mucinex, 600 mg, one to two times a day as needed to loosen and thin congestion. - Advise saline rinses at night before bed to irrigate the sinuses. - If symptoms do not improve in 10 days, consider referral to an ear, nose, and throat specialist. - Monitor humidification settings on CPAP to ensure proper maintenance.  Oral ulcers Oral ulcers are recurring after the end of a clinical trial and associated medication. Currently under the care of a dermatologist specializing in this condition.   Antonio Baumgarten, NP 06/10/2023

## 2023-06-10 NOTE — Patient Instructions (Addendum)
-  MODERATE SLEEP APNEA: Moderate sleep apnea is a condition where your breathing repeatedly stops and starts during sleep. Your condition is well-controlled with your CPAP machine, which you are using consistently. Continue with your current CPAP therapy and follow up in one year if your symptoms remain stable.  -Acute on CHRONIC SINUSITIS: Chronic sinusitis is a condition where the cavities around your nasal passages become inflamed and swollen for at least 12 weeks. You have been experiencing thick, dark phlegm in the mornings, likely due to sinus drainage. You will start a course of antibiotics (Augmentin ) to treat the sinusitis. Additionally, you should use plain Mucinex (600 mg) once or twice a day as needed to help loosen and thin the congestion. Saline rinses at night before bed will help irrigate your sinuses. If your symptoms do not improve in 10 days, we may refer you to an ear, nose, and throat specialist. Also, monitor the humidification settings on your CPAP machine to ensure proper maintenance.  -ORAL ULCERS: Oral ulcers are painful sores inside your mouth. They have returned after you finished a clinical trial. You are currently under the care of a dermatologist for this condition, so please continue following their advice.  Follow-up 1 year with [facility] NP or sooner if needed

## 2023-06-11 ENCOUNTER — Ambulatory Visit (INDEPENDENT_AMBULATORY_CARE_PROVIDER_SITE_OTHER): Payer: Medicare Other

## 2023-06-11 VITALS — BP 127/59 | HR 67 | Temp 98.2°F | Resp 20 | Ht 62.0 in | Wt 191.4 lb

## 2023-06-11 DIAGNOSIS — E785 Hyperlipidemia, unspecified: Secondary | ICD-10-CM | POA: Diagnosis not present

## 2023-06-11 DIAGNOSIS — R931 Abnormal findings on diagnostic imaging of heart and coronary circulation: Secondary | ICD-10-CM

## 2023-06-11 DIAGNOSIS — I10 Essential (primary) hypertension: Secondary | ICD-10-CM

## 2023-06-11 MED ORDER — INCLISIRAN SODIUM 284 MG/1.5ML ~~LOC~~ SOSY
284.0000 mg | PREFILLED_SYRINGE | Freq: Once | SUBCUTANEOUS | Status: AC
  Administered 2023-06-11: 284 mg via SUBCUTANEOUS
  Filled 2023-06-11: qty 1.5

## 2023-06-11 NOTE — Progress Notes (Signed)
 Diagnosis: Hyperlipidemia  Provider:  Chilton Greathouse MD  Procedure: Injection  Leqvio (inclisiran), Dose: 284 mg, Site: subcutaneous, Number of injections: 1  Injection Site(s): Right arm  Post Care:  right arm injection  Discharge: Condition: Good, Destination: Home . AVS Declined  Performed by:  Rico Ala, LPN

## 2023-06-12 ENCOUNTER — Encounter: Payer: Self-pay | Admitting: Cardiology

## 2023-06-13 ENCOUNTER — Other Ambulatory Visit: Payer: Self-pay | Admitting: Internal Medicine

## 2023-06-14 ENCOUNTER — Other Ambulatory Visit: Payer: Self-pay

## 2023-06-14 ENCOUNTER — Ambulatory Visit

## 2023-06-14 VITALS — Ht 62.0 in | Wt 190.0 lb

## 2023-06-14 DIAGNOSIS — Z1211 Encounter for screening for malignant neoplasm of colon: Secondary | ICD-10-CM

## 2023-06-14 MED ORDER — NA SULFATE-K SULFATE-MG SULF 17.5-3.13-1.6 GM/177ML PO SOLN: 1.0000 | Freq: Once | ORAL | 0 refills | Status: AC

## 2023-06-14 NOTE — Progress Notes (Signed)
 Denies allergies to eggs or soy products. Denies complication of anesthesia or sedation. Denies use of weight loss medication. Denies use of O2.   Emmi instructions given for colonoscopy.

## 2023-06-19 NOTE — Progress Notes (Signed)
 Cardiology Office Note:    Date:  06/20/2023   ID:  Sierrah, Luevano [birth year], MRN [id number]  PCP:  Colene Dauphin, MD  Cardiologist:  Wendie Hamburg, MD  Electrophysiologist:  None   Referring MD: Colene Dauphin, MD   No chief complaint on file.   History of Present Illness:    Chelsea Moreno is a 73 y.o. female with a hx of breast cancer, diabetes insipidus, hypertension, hyperlipidemia, OSA on CPAP who presents for follow-up.  She was referred by Dr. Donnette Gal for evaluation of chest pain, initially seen on 11/30/2019.  She reports that on 11/14/2019, she woke up with chest pain.  Describes dull aching pain in the center of her chest, also with tightness in her throat and radiation to her back.  States the pain was 10 out of 10.  Episode lasted about 10 minutes. She reports that she has had multiple episodes of syncope, about 1-2 times per year x15 years.  Episodes are associated with nausea/vomiting/diarrhea.  She has not had an episode of syncope over 2 years.  She denies any palpitations.  Reports occasional lower extremity edema.  She did receive radiation treatment for her breast cancer.  She has a history of diabetes insipidus, unclear cause but thought to be due to head trauma as a child.  She smoked for 3 to 4 years, quit in 1974.  Family history includes sister had MI and CVA in early 30s.  Father died of MI in 62s.  Brother had MI in 65s.  Echocardiogram on 12/28/2019 showed normal biventricular function, no significant valvular disease.  Lexiscan  Myoview  on 12/28/2019 showed EF 63%, apical inferior/inferolateral perfusion defect likely consistent with artifact.  Calcium  score on 12/28/2019 was 1202 (98th percentile).  Zio patch x 2 weeks 12/2022 showed no significant arrhythmias.  Since last clinic visit, she reports she is doing well.  She is back on her CPAP and her blood pressure is improved.  Denies any chest pain, dyspnea, lightheadedness, syncope, or palpitations.   Continues to have some lower extremity edema, particularly if sitting a lot.  She is exercising 2 to 3 days/week at [facility].   Past Medical History:  Diagnosis Date   Anxiety    Breast cancer (HCC) 11/05/2012   left   Bursitis    Cataract    COLONIC POLYPS, HX OF 09/24/2008   DEPRESSION 09/23/2007   takes Lexapro  daily but hasn't taken since Sept 2014   Diabetes insipidus (HCC)    takes Desmopressin  bid   Diarrhea    Dizziness    when nauseated gets dizzy   Family history of ovarian cancer    Glaucoma    Hemorrhoids    History of bronchitis    yrs ago   History of migraine    last time many yrs ago   Hx of radiation therapy 01/29/13- 03/16/13   left breast 4500 cGy 25 sessions, left breast boost 1000 cGy 5 sessions   HYPERLIPIDEMIA 09/06/2006   takes Atorvasatin nightly   HYPERTENSION 09/06/2006   takes Lisinopril  daily   Muscle spasms of head and/or neck    OSTEOPOROSIS 09/24/2008   takes Actonel  every 30days   Sleep apnea    Vitamin D  deficiency    takes OTC Vit D    Past Surgical History:  Procedure Laterality Date   BREAST BIOPSY Left 85yrs ago   BREAST LUMPECTOMY WITH NEEDLE LOCALIZATION Left 12/16/2012   Procedure: PARTIAL MASTECTOMY WITH NEEDLE LOCALIZATION;  Surgeon: Bretta Camp  Cherlynn Cornfield, MD;  Location: [facility] OR;  Service: General;  Laterality: Left;  1:00 NL at [facility]    CATARACT EXTRACTION Bilateral    2024   COLONOSCOPY     eye lid surgery Left     Current Medications: Current Meds  Medication Sig   amLODipine  (NORVASC ) 5 MG tablet Take 1.5 tablets (7.5 mg total) by mouth daily.   aspirin  EC 81 MG tablet Take 1 tablet (81 mg total) by mouth daily. Swallow whole.   atorvastatin  (LIPITOR) 80 MG tablet Take 1 tablet (80 mg total) by mouth daily.   Calcium  Carb-Cholecalciferol (CALTRATE 600+D3) 600-20 MG-MCG TABS Take 1 tablet by mouth daily.   carvedilol  (COREG ) 6.25 MG tablet TAKE 1 TABLET TWICE A DAY   Cholecalciferol (VITAMIN D3) 25 MCG (1000 UT) capsule Take 3  capsules by mouth daily.   desmopressin  (DDAVP  NASAL) 0.01 % solution USE 1 SPRAY (10 MCG) IN EACH NOSTRIL TWICE A DAY   dorzolamide-timolol  (COSOPT) 2-0.5 % ophthalmic solution Place 1 drop into both eyes 2 (two) times daily.   inclisiran (LEQVIO ) 284 MG/1.5ML SOSY injection Inject 284 mg into the skin every 6 (six) months.   lisinopril  (ZESTRIL ) 20 MG tablet Take 20 mg by mouth daily.   Ondansetron  HCl (ZOFRAN  PO) Take by mouth.   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  Tobacco Use   Smoking status: Former    Current packs/day: 0.00    Average packs/day: 0.5 packs/day for 4.0 years (2.0 ttl pk-yrs)    Types: Cigarettes    Start date: 01/07/1969    Quit date: 01/07/1973    Years since quitting: 50.4    Passive exposure: Never   Smokeless tobacco: Never   Tobacco comments:    quit 6yrs ago  Vaping Use   Vaping status: Never Used  Substance and Sexual Activity   Alcohol use: Not Currently   Drug use: No   Sexual activity: Not Currently    Birth control/protection: Post-menopausal    Comment: menarche age 22, first live birth age 32, menopause 2002, no HRT  Other Topics Concern   Not on file  Social History Narrative   Lives alone    Social Drivers of Health   Financial Resource Strain: Low Risk  (03/31/2023)   Overall Financial Resource Strain (CARDIA)    Difficulty of Paying Living Expenses: Not very hard  Food Insecurity: No Food Insecurity (03/31/2023)   Hunger Vital Sign    Worried About Running Out of Food in the Last Year: Never true    Ran Out of Food in the Last Year: Never true  Transportation Needs: No Transportation  Needs (03/31/2023)   PRAPARE - Administrator, Civil Service (Medical): No    Lack of Transportation (Non-Medical): No  Physical Activity: Insufficiently Active (03/31/2023)   Exercise Vital Sign    Days of Exercise per Week: 3 days    Minutes of Exercise per Session: 30 min  Stress: Stress Concern Present (03/31/2023)   [employer] of Occupational Health - Occupational Stress Questionnaire    Feeling of Stress : To some  extent  Social Connections: Socially Isolated (03/31/2023)   Social Connection and Isolation Panel    Frequency of Communication with Friends and Family: Once a week    Frequency of Social Gatherings with Friends and Family: Once a week    Attends Religious Services: Never    Database administrator or Organizations: Yes    Attends Engineer, structural: More than 4 times per year    Marital Status: Widowed     Family History: The patient's family history includes ALS in her brother; Alcohol abuse in her maternal grandfather; Cancer in her maternal aunt and mother; Heart attack in her father, maternal grandmother, and mother; Heart attack (age of onset: 27) in her brother; Heart attack (age of onset: 76) in her sister; Heart disease in her brother, father, mother, and sister; Hyperlipidemia in her father; Hypertension in her brother, father, and sister; Ovarian cancer in her mother; Parkinson's disease in her sister; Varicose Veins in her mother. There is no history of Colon cancer, Esophageal cancer, Stomach cancer, or Rectal cancer.  ROS:   Please see the history of present illness.     All other systems reviewed and are negative.  EKGs/Labs/Other Studies Reviewed:    The following studies were reviewed today:   EKG:   12/26/2021: Normal sinus rhythm, poor R wave progression, nonspecific T wave flattening 12/25/2022: Sinus bradycardia, rate 54, poor R wave progression, nonspecific T wave flattening 06/20/2023: Sinus bradycardia, rate 55, poor  R wave progression, nonspecific T wave flattening  Recent Labs: 04/03/2023: ALT 19; BUN 9; Creatinine, Ser 0.66; Hemoglobin 13.2; Platelets 303.0; Potassium 4.6; Sodium 139; TSH 1.00  Recent Lipid Panel    Component Value Date/Time   CHOL 130 04/03/2023 1341   CHOL 85 (L) 06/07/2021 1055   TRIG 121.0 04/03/2023 1341   HDL 46.00 04/03/2023 1341   HDL 37 (L) 06/07/2021 1055   CHOLHDL 3 04/03/2023 1341   VLDL 24.2 04/03/2023 1341   LDLCALC 60 04/03/2023 1341   LDLCALC 28 06/07/2021 1055   LDLCALC 155 (H) 09/28/2019 1156   LDLDIRECT 171.8 03/21/2011 0936    Physical Exam:    VS:  BP (!) 110/52 (BP Location: Right Arm, Patient Position: Sitting, Cuff Size: Normal)   Pulse (!) 55   Ht 5' 2 (1.575 m)   Wt 188 lb 9.6 oz (85.5 kg)   SpO2 95%   BMI 34.50 kg/m     Wt Readings from Last 3 Encounters:  06/20/23 188 lb 9.6 oz (85.5 kg)  06/14/23 190 lb (86.2 kg)  06/11/23 191 lb 6.4 oz (86.8 kg)     GEN:  Well nourished, well developed in no acute distress HEENT: Normal NECK: No JVD; No carotid bruits LYMPHATICS: No lymphadenopathy CARDIAC: RRR, no murmurs, rubs, gallops RESPIRATORY:  Clear to auscultation without rales, wheezing or rhonchi  ABDOMEN: Soft, non-tender, non-distended MUSCULOSKELETAL:  No edema; No deformity  SKIN: Warm and dry NEUROLOGIC:  Alert and oriented x 3 PSYCHIATRIC:  Normal affect   ASSESSMENT:    1. CAD in native artery   2. Essential hypertension   3. Hyperlipidemia, unspecified hyperlipidemia type       PLAN:    CAD: Reported atypical chest pain.  Echocardiogram on 12/28/2019 showed normal biventricular function, no significant valvular disease.  Lexiscan  Myoview  on 12/28/2019 showed EF 63%, apical inferior/inferolateral perfusion defect likely consistent with artifact.  Calcium  score on 12/28/2019 was 1202 (98th percentile).   -She reports no recent anginal symptoms -LDL remained above  goal despite atorvastatin  and Zetia .  She was started on  inclisiran, LDL 28 on 06/07/2021.  Most recent LDL 60 on 04/03/2023 -Continue aspirin  81 mg daily  Palpitations: Zio patch x 2 weeks 12/2022 showed no significant arrhythmias.  Reports no recent palpitations  Hypertension: On lisinopril  40 mg daily and carvedilol  6.25 mg twice daily and amlodipine  7.5 mg daily.  Appears controlled  Hyperlipidemia: LDL above goal despite atorvastatin  and Zetia .  She was started on inclisiran, LDL 28 on 06/07/2021.  Most recent LDL 60 on 04/03/2023  OSA: had to stop using CPAP due to mouth ulcers.  Referred to sleep medicine and now back on CPAP, reports compliance  RTC in 6 months     Medication Adjustments/Labs and Tests Ordered: Current medicines are reviewed at length with the patient today.  Concerns regarding medicines are outlined above.  Orders Placed This Encounter  Procedures   EKG 12-Lead   No orders of the defined types were placed in this encounter.   Patient Instructions  Medication Instructions:  Continue current medication *If you need a refill on your cardiac medications before your next appointment, please call your pharmacy*  Lab Work: none If you have labs (blood work) drawn today and your tests are completely normal, you will receive your results only by: MyChart Message (if you have MyChart) OR A paper copy in the mail If you have any lab test that is abnormal or we need to change your treatment, we will call you to review the results.  Testing/Procedures: none  Follow-Up: At [facility], you and your health needs are our priority.  As part of our continuing mission to provide you with exceptional heart care, our providers are all part of one team.  This team includes your primary Cardiologist (physician) and Advanced Practice Providers or APPs (Physician Assistants and Nurse Practitioners) who all work together to provide you with the care you need, when you need it.  Your next appointment:   6  month(s)  Provider:   Wendie Hamburg, MD    We recommend signing up for the patient portal called MyChart.  Sign up information is provided on this After Visit Summary.  MyChart is used to connect with patients for Virtual Visits (Telemedicine).  Patients are able to view lab/test results, encounter notes, upcoming appointments, etc.  Non-urgent messages can be sent to your provider as well.   To learn more about what you can do with MyChart, go to [email or web address].   Other Instructions none       Signed, Wendie Hamburg, MD  06/20/2023 1:48 PM    [facility]

## 2023-06-20 ENCOUNTER — Encounter: Payer: Self-pay | Admitting: Cardiology

## 2023-06-20 ENCOUNTER — Ambulatory Visit: Payer: Medicare Other | Attending: Cardiology | Admitting: Cardiology

## 2023-06-20 VITALS — BP 110/52 | HR 55 | Ht 62.0 in | Wt 188.6 lb

## 2023-06-20 DIAGNOSIS — I2510 Atherosclerotic heart disease of native coronary artery without angina pectoris: Secondary | ICD-10-CM | POA: Insufficient documentation

## 2023-06-20 DIAGNOSIS — I10 Essential (primary) hypertension: Secondary | ICD-10-CM | POA: Diagnosis not present

## 2023-06-20 DIAGNOSIS — E785 Hyperlipidemia, unspecified: Secondary | ICD-10-CM | POA: Insufficient documentation

## 2023-06-20 NOTE — Patient Instructions (Addendum)
 Medication Instructions:  Continue current medication *If you need a refill on your cardiac medications before your next appointment, please call your pharmacy*  Lab Work: none If you have labs (blood work) drawn today and your tests are completely normal, you will receive your results only by: MyChart Message (if you have MyChart) OR A paper copy in the mail If you have any lab test that is abnormal or we need to change your treatment, we will call you to review the results.  Testing/Procedures: none  Follow-Up: At [facility], you and your health needs are our priority.  As part of our continuing mission to provide you with exceptional heart care, our providers are all part of one team.  This team includes your primary Cardiologist (physician) and Advanced Practice Providers or APPs (Physician Assistants and Nurse Practitioners) who all work together to provide you with the care you need, when you need it.  Your next appointment:   6 month(s)  Provider:   Wendie Hamburg, MD    We recommend signing up for the patient portal called MyChart.  Sign up information is provided on this After Visit Summary.  MyChart is used to connect with patients for Virtual Visits (Telemedicine).  Patients are able to view lab/test results, encounter notes, upcoming appointments, etc.  Non-urgent messages can be sent to your provider as well.   To learn more about what you can do with MyChart, go to [email or web address].   Other Instructions none

## 2023-06-21 ENCOUNTER — Encounter: Payer: Self-pay | Admitting: Cardiology

## 2023-07-03 DIAGNOSIS — H401132 Primary open-angle glaucoma, bilateral, moderate stage: Secondary | ICD-10-CM | POA: Diagnosis not present

## 2023-07-03 DIAGNOSIS — H52203 Unspecified astigmatism, bilateral: Secondary | ICD-10-CM | POA: Diagnosis not present

## 2023-07-03 DIAGNOSIS — Z961 Presence of intraocular lens: Secondary | ICD-10-CM | POA: Diagnosis not present

## 2023-07-05 ENCOUNTER — Encounter: Payer: Self-pay | Admitting: Gastroenterology

## 2023-07-05 ENCOUNTER — Ambulatory Visit: Admitting: Gastroenterology

## 2023-07-05 VITALS — BP 104/51 | HR 54 | Temp 98.4°F | Resp 18 | Ht 62.0 in | Wt 190.0 lb

## 2023-07-05 DIAGNOSIS — K5730 Diverticulosis of large intestine without perforation or abscess without bleeding: Secondary | ICD-10-CM | POA: Diagnosis not present

## 2023-07-05 DIAGNOSIS — K644 Residual hemorrhoidal skin tags: Secondary | ICD-10-CM | POA: Diagnosis not present

## 2023-07-05 DIAGNOSIS — K648 Other hemorrhoids: Secondary | ICD-10-CM | POA: Diagnosis not present

## 2023-07-05 DIAGNOSIS — I10 Essential (primary) hypertension: Secondary | ICD-10-CM | POA: Diagnosis not present

## 2023-07-05 DIAGNOSIS — Z1211 Encounter for screening for malignant neoplasm of colon: Secondary | ICD-10-CM

## 2023-07-05 DIAGNOSIS — E785 Hyperlipidemia, unspecified: Secondary | ICD-10-CM | POA: Diagnosis not present

## 2023-07-05 DIAGNOSIS — G4730 Sleep apnea, unspecified: Secondary | ICD-10-CM | POA: Diagnosis not present

## 2023-07-05 DIAGNOSIS — D123 Benign neoplasm of transverse colon: Secondary | ICD-10-CM

## 2023-07-05 DIAGNOSIS — F32A Depression, unspecified: Secondary | ICD-10-CM | POA: Diagnosis not present

## 2023-07-05 MED ORDER — SODIUM CHLORIDE 0.9 % IV SOLN: 500.0000 mL | Freq: Once | INTRAVENOUS | Status: DC

## 2023-07-05 NOTE — Progress Notes (Signed)
 Called to room to assist during endoscopic procedure.  Patient ID and intended procedure confirmed with present staff. Received instructions for my participation in the procedure from the performing physician.

## 2023-07-05 NOTE — Patient Instructions (Signed)
-   Resume previous diet. - Continue present medications. - Await pathology results. - No repeat colonoscopy due to age.   YOU HAD AN ENDOSCOPIC PROCEDURE TODAY AT THE [facility]:   Refer to the procedure report that was given to you for any specific questions about what was found during the examination.  If the procedure report does not answer your questions, please call your gastroenterologist to clarify.  If you requested that your care partner not be given the details of your procedure findings, then the procedure report has been included in a sealed envelope for you to review at your convenience later.  YOU SHOULD EXPECT: Some feelings of bloating in the abdomen. Passage of more gas than usual.  Walking can help get rid of the air that was put into your GI tract during the procedure and reduce the bloating. If you had a lower endoscopy (such as a colonoscopy or flexible sigmoidoscopy) you may notice spotting of blood in your stool or on the toilet paper. If you underwent a bowel prep for your procedure, you may not have a normal bowel movement for a few days.  Please Note:  You might notice some irritation and congestion in your nose or some drainage.  This is from the oxygen used during your procedure.  There is no need for concern and it should clear up in a day or so.  SYMPTOMS TO REPORT IMMEDIATELY:  Following lower endoscopy (colonoscopy or flexible sigmoidoscopy):  Excessive amounts of blood in the stool  Significant tenderness or worsening of abdominal pains  Swelling of the abdomen that is new, acute  Fever of 100F or higher  For urgent or emergent issues, a gastroenterologist can be reached at any hour by calling [phone]. Do not use MyChart messaging for urgent concerns.    DIET:  We do recommend a small meal at first, but then you may proceed to your regular diet.  Drink plenty of fluids but you should avoid alcoholic beverages for 24 hours.  ACTIVITY:   You should plan to take it easy for the rest of today and you should NOT DRIVE or use heavy machinery until tomorrow (because of the sedation medicines used during the test).    FOLLOW UP: Our staff will call the number listed on your records the next business day following your procedure.  We will call around 7:15- 8:00 am to check on you and address any questions or concerns that you may have regarding the information given to you following your procedure. If we do not reach you, we will leave a message.     If any biopsies were taken you will be contacted by phone or by letter within the next 1-3 weeks.  Please call us at [phone] if you have not heard about the biopsies in 3 weeks.    SIGNATURES/CONFIDENTIALITY: You and/or your care partner have signed paperwork which will be entered into your electronic medical record.  These signatures attest to the fact that that the information above on your After Visit Summary has been reviewed and is understood.  Full responsibility of the confidentiality of this discharge information lies with you and/or your care-partner.

## 2023-07-05 NOTE — Op Note (Signed)
 [facility] Patient Name: Chelsea Moreno Procedure Date: 07/05/2023 1:35 PM MRN: [id number] Endoscopist: Gustav ALONSO Mcgee , MD, [id number] Age: 73 Referring MD:  Date of Birth: 01-01-1951 Gender: Female Account #: [id number] Procedure:                Colonoscopy Indications:              Screening for colorectal malignant neoplasm Medicines:                Monitored Anesthesia Care Procedure:                Pre-Anesthesia Assessment:                           - Prior to the procedure, a History and Physical                            was performed, and patient medications and                            allergies were reviewed. The patient's tolerance of                            previous anesthesia was also reviewed. The risks                            and benefits of the procedure and the sedation                            options and risks were discussed with the patient.                            All questions were answered, and informed consent                            was obtained. Prior Anticoagulants: The patient has                            taken no anticoagulant or antiplatelet agents. ASA                            Grade Assessment: II - A patient with mild systemic                            disease. After reviewing the risks and benefits,                            the patient was deemed in satisfactory condition to                            undergo the procedure.                           After obtaining informed consent, the colonoscope  was passed under direct vision. Throughout the                            procedure, the patient's blood pressure, pulse, and                            oxygen saturations were monitored continuously. The                            Olympus Scope [id number] was introduced through the                            anus and advanced to the the cecum, identified by                             appendiceal orifice and ileocecal valve. The                            colonoscopy was performed without difficulty. The                            patient tolerated the procedure well. The quality                            of the bowel preparation was good. The ileocecal                            valve, appendiceal orifice, and rectum were                            photographed. Scope In: 1:40:21 PM Scope Out: 1:54:50 PM Scope Withdrawal Time: 0 hours 10 minutes 26 seconds  Total Procedure Duration: 0 hours 14 minutes 29 seconds  Findings:                 The perianal and digital rectal examinations were                            normal.                           A 4 mm polyp was found in the transverse colon. The                            polyp was sessile. The polyp was removed with a                            cold snare. Resection and retrieval were complete.                           Scattered small-mouthed diverticula were found in                            the sigmoid colon. Peri-diverticular erythema was  seen.                           Non-bleeding external and internal hemorrhoids were                            found during retroflexion. The hemorrhoids were                            medium-sized. Complications:            No immediate complications. Estimated Blood Loss:     Estimated blood loss was minimal. Impression:               - One 4 mm polyp in the transverse colon, removed                            with a cold snare. Resected and retrieved.                           - Moderate diverticulosis in the sigmoid colon.                            Peri-diverticular erythema was seen.                           - Non-bleeding external and internal hemorrhoids. Recommendation:           - Patient has a contact number available for                            emergencies. The signs and symptoms of potential                            delayed  complications were discussed with the                            patient. Return to normal activities tomorrow.                            Written discharge instructions were provided to the                            patient.                           - Resume previous diet.                           - Continue present medications.                           - Await pathology results.                           - No repeat colonoscopy due to age. Bill Yohn V. Willard Madrigal, MD 07/05/2023 2:05:13 PM This report has been signed electronically.

## 2023-07-05 NOTE — Progress Notes (Signed)
 To pacu, VSS. Report to [email or web address]

## 2023-07-05 NOTE — Progress Notes (Signed)
 [facility] History and Physical   Primary Care Physician:  Geofm Glade PARAS, MD   Reason for Procedure:  Colorectal cancer screening  Plan:    Screening colonoscopy with possible interventions as needed     HPI: Chelsea Moreno is a very pleasant 73 y.o. female here for screening colonoscopy. Denies any nausea, vomiting, abdominal pain, melena or bright red blood per rectum  The risks and benefits as well as alternatives of endoscopic procedure(s) have been discussed and reviewed. All questions answered. The patient agrees to proceed.    Past Medical History:  Diagnosis Date   Anxiety    Breast cancer (HCC) 11/05/2012   left   Bursitis    Cataract    COLONIC POLYPS, HX OF 09/24/2008   DEPRESSION 09/23/2007   takes Lexapro  daily but hasn't taken since Sept 2014   Diabetes insipidus (HCC)    takes Desmopressin  bid   Diarrhea    Dizziness    when nauseated gets dizzy   Family history of ovarian cancer    Glaucoma    Hemorrhoids    History of bronchitis    yrs ago   History of migraine    last time many yrs ago   Hx of radiation therapy 01/29/13- 03/16/13   left breast 4500 cGy 25 sessions, left breast boost 1000 cGy 5 sessions   HYPERLIPIDEMIA 09/06/2006   takes Atorvasatin nightly   HYPERTENSION 09/06/2006   takes Lisinopril  daily   Muscle spasms of head and/or neck    OSTEOPOROSIS 09/24/2008   takes Actonel  every 30days   Sleep apnea    Vitamin D  deficiency    takes OTC Vit D    Past Surgical History:  Procedure Laterality Date   BREAST BIOPSY Left 12yrs ago   BREAST LUMPECTOMY WITH NEEDLE LOCALIZATION Left 12/16/2012   Procedure: PARTIAL MASTECTOMY WITH NEEDLE LOCALIZATION;  Surgeon: [name], MD;  Location: [facility] OR;  Service: General;  Laterality: Left;  1:00 NL at [facility]    CATARACT EXTRACTION Bilateral    2024   COLONOSCOPY     eye lid surgery Left     Prior to Admission medications   Medication Sig Start Date End Date Taking?  Authorizing Provider  aspirin  EC 81 MG tablet Take 1 tablet (81 mg total) by mouth daily. Swallow whole. 10/04/21  Yes Burns, Glade PARAS, MD  atorvastatin  (LIPITOR) 80 MG tablet Take 1 tablet (80 mg total) by mouth daily. 10/03/22  Yes Burns, Glade PARAS, MD  Calcium  Carb-Cholecalciferol (CALTRATE 600+D3) 600-20 MG-MCG TABS Take 1 tablet by mouth daily. 12/28/21  Yes [provider]  carvedilol  (COREG ) 6.25 MG tablet TAKE 1 TABLET TWICE A DAY 01/31/23  Yes Kate Lonni CROME, MD  Cholecalciferol (VITAMIN D3) 25 MCG (1000 UT) capsule Take 3 capsules by mouth daily. 12/28/21  Yes [provider]  desmopressin  (DDAVP  NASAL) 0.01 % solution USE 1 SPRAY (10 MCG) IN EACH NOSTRIL TWICE A DAY 06/13/23  Yes Burns, Glade PARAS, MD  dorzolamide-timolol  (COSOPT) 2-0.5 % ophthalmic solution Place 1 drop into both eyes 2 (two) times daily. 03/13/23  Yes [provider]  lisinopril  (ZESTRIL ) 20 MG tablet Take 20 mg by mouth daily.   Yes [provider]  VYZULTA  0.024 % SOLN INSTILL 1 DROP INTO EACH EYE ONCE DAILY AT BEDTIME 07/09/18  Yes [provider]  amLODipine  (NORVASC ) 5 MG tablet Take 1.5 tablets (7.5 mg total) by mouth daily. 04/03/23 07/02/23  Kate Lonni CROME, MD  inclisiran (LEQVIO ) [phone]  MG/1.5ML SOSY injection Inject 284 mg into the skin every 6 (six) months.    [provider]  Ondansetron  HCl (ZOFRAN  PO) Take by mouth.    [provider]    Current Outpatient Medications  Medication Sig Dispense Refill   aspirin  EC 81 MG tablet Take 1 tablet (81 mg total) by mouth daily. Swallow whole. 30 tablet 12   atorvastatin  (LIPITOR) 80 MG tablet Take 1 tablet (80 mg total) by mouth daily. 90 tablet 3   Calcium  Carb-Cholecalciferol (CALTRATE 600+D3) 600-20 MG-MCG TABS Take 1 tablet by mouth daily.     carvedilol  (COREG ) 6.25 MG tablet TAKE 1 TABLET TWICE A DAY 180 tablet 3   Cholecalciferol (VITAMIN D3) 25 MCG (1000 UT) capsule Take 3 capsules by mouth  daily.     desmopressin  (DDAVP  NASAL) 0.01 % solution USE 1 SPRAY (10 MCG) IN EACH NOSTRIL TWICE A DAY 10 mL 13   dorzolamide-timolol  (COSOPT) 2-0.5 % ophthalmic solution Place 1 drop into both eyes 2 (two) times daily.     lisinopril  (ZESTRIL ) 20 MG tablet Take 20 mg by mouth daily.     VYZULTA  0.024 % SOLN INSTILL 1 DROP INTO EACH EYE ONCE DAILY AT BEDTIME     amLODipine  (NORVASC ) 5 MG tablet Take 1.5 tablets (7.5 mg total) by mouth daily. 90 tablet 3   inclisiran (LEQVIO ) 284 MG/1.5ML SOSY injection Inject 284 mg into the skin every 6 (six) months.     Ondansetron  HCl (ZOFRAN  PO) Take by mouth.     Current Facility-Administered Medications  Medication Dose Route Frequency Provider Last Rate Last Admin   0.9 %  sodium chloride  infusion  500 mL Intravenous Once Vyron Fronczak V, MD        Allergies as of 07/05/2023 - Review Complete 07/05/2023  Allergen Reaction Noted   Brinzolamide-brimonidine Dermatitis, Other (See Comments), and Rash 02/14/2022    Family History  Problem Relation Age of Onset   Ovarian cancer Mother    Cancer Mother        Ovarian   Heart disease Mother        Before age 61   Heart attack Mother    Varicose Veins Mother    Heart disease Father        Before age 26   Hyperlipidemia Father    Hypertension Father    Heart attack Father    Heart disease Sister        Before age 80   Hypertension Sister    Heart attack Sister 6       second heart attack at 38   Parkinson's disease Sister    Heart disease Brother        Before age 34   Hypertension Brother    Heart attack Brother 43   ALS Brother    Cancer Maternal Aunt        GYN cancer   Heart attack Maternal Grandmother    Alcohol abuse Maternal Grandfather    Colon cancer Neg Hx    Esophageal cancer Neg Hx    Stomach cancer Neg Hx    Rectal cancer Neg Hx     Social History   Socioeconomic History   Marital status: Widowed    Spouse name: Not on file   Number of children: Not on file    Years of education: Not on file   Highest education level: Doctorate  Occupational History   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  Tobacco Use   Smoking  status: Former    Current packs/day: 0.00    Average packs/day: 0.5 packs/day for 4.0 years (2.0 ttl pk-yrs)    Types: Cigarettes    Start date: 01/07/1969    Quit date: 01/07/1973    Years since quitting: 50.5    Passive exposure: Never   Smokeless tobacco: Never   Tobacco comments:    quit 80yrs ago  Vaping Use   Vaping status: Never Used  Substance and Sexual Activity   Alcohol use: Not Currently   Drug use: No   Sexual activity: Not Currently    Birth control/protection: Post-menopausal    Comment: menarche age 60, first live birth age 24, menopause 2002, no HRT  Other Topics Concern   Not on file  Social History Narrative   Lives alone    Social Drivers of Health   Financial Resource Strain: Low Risk  (03/31/2023)   Overall Financial Resource Strain (CARDIA)    Difficulty of Paying Living Expenses: Not very hard  Food Insecurity: No Food Insecurity (03/31/2023)   Hunger Vital Sign    Worried About Running Out of Food in the Last Year: Never true    Ran Out of Food in the Last Year: Never true  Transportation Needs: No Transportation Needs (03/31/2023)   PRAPARE - Administrator, Civil Service (Medical): No    Lack of Transportation (Non-Medical): No  Physical Activity: Insufficiently Active (03/31/2023)   Exercise Vital Sign    Days of Exercise per Week: 3 days    Minutes of Exercise per Session: 30 min  Stress: Stress Concern Present (03/31/2023)   [employer] of Occupational Health - Occupational Stress Questionnaire    Feeling of Stress : To some extent  Social Connections: Socially Isolated (03/31/2023)   Social Connection and Isolation Panel    Frequency of Communication with Friends and Family: Once a week    Frequency of Social Gatherings with Friends and Family: Once a week    Attends  Religious Services: Never    Database administrator or Organizations: Yes    Attends Engineer, structural: More than 4 times per year    Marital Status: Widowed  Intimate Partner Violence: Patient Unable To Answer (04/05/2023)   Humiliation, Afraid, Rape, and Kick questionnaire    Fear of Current or Ex-Partner: Patient unable to answer    Emotionally Abused: Patient unable to answer    Physically Abused: Patient unable to answer    Sexually Abused: Patient unable to answer    Review of Systems:  All other review of systems negative except as mentioned in the HPI.  Physical Exam: Vital signs in last 24 hours: BP 134/65   Pulse (!) 55   Temp 98.4 F (36.9 C) (Temporal)   Resp 11   Ht 5' 2 (1.575 m)   Wt 190 lb (86.2 kg)   SpO2 98%   BMI 34.75 kg/m  General:   Alert, NAD Lungs:  Clear .   Heart:  Regular rate and rhythm Abdomen:  Soft, nontender and nondistended. Neuro/Psych:  Alert and cooperative. Normal mood and affect. A and O x 3  Reviewed labs, radiology imaging, old records and pertinent past GI work up  Patient is appropriate for planned procedure(s) and anesthesia in an ambulatory setting   K. Veena Tyona Nilsen , MD [phone]

## 2023-07-05 NOTE — Progress Notes (Signed)
 Vitals-AH  Pt's states no medical or surgical changes since previsit or office visit.

## 2023-07-08 ENCOUNTER — Telehealth: Payer: Self-pay | Admitting: *Deleted

## 2023-07-08 NOTE — Telephone Encounter (Signed)
 No answer on  follow up call. Left message.

## 2023-07-11 LAB — SURGICAL PATHOLOGY

## 2023-07-25 DIAGNOSIS — Z79899 Other long term (current) drug therapy: Secondary | ICD-10-CM | POA: Diagnosis not present

## 2023-07-25 DIAGNOSIS — C4499 Other specified malignant neoplasm of skin, unspecified: Secondary | ICD-10-CM | POA: Diagnosis not present

## 2023-07-25 DIAGNOSIS — L438 Other lichen planus: Secondary | ICD-10-CM | POA: Diagnosis not present

## 2023-07-31 ENCOUNTER — Encounter: Payer: Self-pay | Admitting: Dermatology

## 2023-08-07 ENCOUNTER — Ambulatory Visit: Admitting: Dermatology

## 2023-08-07 ENCOUNTER — Encounter: Payer: Self-pay | Admitting: Dermatology

## 2023-08-07 VITALS — BP 118/78 | Temp 97.7°F

## 2023-08-07 DIAGNOSIS — L814 Other melanin hyperpigmentation: Secondary | ICD-10-CM | POA: Diagnosis not present

## 2023-08-07 DIAGNOSIS — C441991 Other specified malignant neoplasm of skin of left upper eyelid, including canthus: Secondary | ICD-10-CM | POA: Diagnosis not present

## 2023-08-07 DIAGNOSIS — L579 Skin changes due to chronic exposure to nonionizing radiation, unspecified: Secondary | ICD-10-CM

## 2023-08-07 DIAGNOSIS — C4490 Unspecified malignant neoplasm of skin, unspecified: Secondary | ICD-10-CM

## 2023-08-07 MED ORDER — OXYCODONE HCL 5 MG PO TABS
5.0000 mg | ORAL_TABLET | Freq: Four times a day (QID) | ORAL | 0 refills | Status: DC | PRN
Start: 2023-08-07 — End: 2023-10-03

## 2023-08-07 NOTE — Progress Notes (Signed)
 Follow-Up Visit   Subjective  Chelsea Moreno is a 73 y.o. female who presents for the following: Mohs of a sweat gland tumor on the left upper medial eyelid.  Patient is being seen by Chelsea Moreno  at [facility] for repair on 08/09/2023. Patient notes that there was a cyst on the eyelid 10 years ago that was removed by the ophthalmologist. 18 months ago a lesion came back but there were multiple. Mid 2024 she was encouraged by her ophthalmologist to get them removed. She was referred to Chelsea Moreno who initially thought they were benign. When she returned for follow up he noticed that they had grown and performed a biopsy.   The following portions of the chart were reviewed this encounter and updated as appropriate: medications, allergies, medical history  Review of Systems:  No other skin or systemic complaints except as noted in HPI or Assessment and Plan.  Objective  Well appearing patient in no apparent distress; mood and affect are within normal limits.  A focused examination was performed of the following areas: Left upper medial eyelid Relevant physical exam findings are noted in the Assessment and Plan.   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 No   Was the anticoagulation regimen changed prior to Mohs? 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 No    Micrographic Surgery Details: Post-operative length (cm): 0.6 Post-operative width (cm): 1.5 Number of Mohs stages: 3 Cumulative additional sections past 5 per stage: 0 Post surgery depth of defect: skeletal muscle  Stage 1    Other tumor features identified: sweat gland tumor    Depth of tumor invasion after stage: dermis  Stage 2    Other tumor features identified: sweat gland tumor  Stage 3    Tumor features identified on Mohs section: no tumor identified    Depth of tumor invasion after stage: dermis  Reconstruction: Was the defect reconstructed?: No (Referred to Chelsea Moreno for closure)    Opioids: Did the patient receive a prescription for opioid/narcotic related to Mohs surgery? Yes   Indications for opioid/narcotics: patient required additional pain relief despite trial of non-opioid analgesia  Antibiotics: Does patient meet AHA guidelines for endocarditis?: No   Does patient meet AHA guidelines for orthopedic prophylaxis?: No   Were antibiotics given on the day of surgery?: No   Did surgery breach mucosa, expose cartilage/bone, involve an area of lymphedema/inflamed/infected tissue? No    Related Medications oxyCODONE  (OXY IR/ROXICODONE ) 5 MG immediate release tablet Take 1 tablet (5 mg total) by mouth every 6 (six) hours as needed for up to 10 doses.   Return  if symptoms worsen or fail to improve.  Chelsea Rollene Gobble, RN, am acting as scribe for Chelsea CHRISTELLA HOLY, MD  .   08/07/2023  HISTORY OF PRESENT ILLNESS  AREEBAH Moreno is seen in consultation at the request of Chelsea Moreno [facility]) for biopsy-proven Sweat Gland Neoplasm of the upper eyelid. They note that the area has been present for about 2-3 years increasing in size with time and s/p previous removal, initially thought to be a  cyst.  There is no history of previous treatment.  Reports no other new or changing lesions and has no other complaints today.  Medications and allergies: see patient chart.  Review of systems: Reviewed 8 systems and notable for the above skin cancer.  All other systems reviewed are unremarkable/negative, unless noted in the HPI. Past medical history, surgical history, family history, social history were also reviewed and are noted in the chart/questionnaire.    PHYSICAL EXAMINATION  General: Well-appearing, in no acute distress, alert and oriented x 4. Vitals reviewed in chart (if available).   Skin: Exam reveals a 1.0 x 0.6 cm erythematous papule and biopsy scar on the upper eyelid. There are rhytids, telangiectasias, and lentigines, consistent with photodamage.  Biopsy report(s) reviewed, confirming the diagnosis.   ASSESSMENT  1) Sweat Gland Neoplasm of the upper eyelid 2) photodamage 3) solar lentigines   PLAN   1. Due to location, size, histology, or recurrence and the likelihood of subclinical extension as well as the need to conserve normal surrounding tissue, the patient was deemed acceptable for Mohs micrographic surgery (MMS).  The nature and purpose of the procedure, associated benefits and risks including recurrence and scarring, possible complications such as pain, infection, and bleeding, and alternative methods of treatment if appropriate were discussed with the patient during consent. The lesion location was verified by the patient, by reviewing previous notes, pathology reports, and by photographs as well as angulation measurements if available.   Informed consent was reviewed and signed by the patient, and timeout was performed at 8:30 AM. See op note below.  2. For the photodamage and solar lentigines, sun protection discussed/information given on OTC sunscreens, and we recommend continued regular follow-up with primary dermatologist every 6 months or sooner for any growing, bleeding, or changing lesions. 3. Prognosis and future surveillance discussed. 4. Letter with treatment outcome sent to referring provider. 5. Pain acetaminophen /ibuprofen/oxycodone  5 mg  MOHS MICROGRAPHIC SURGERY AND RECONSTRUCTION  Initial size:   1.0 x 0.6 cm Surgical defect/wound size: 1.5 x 0.6 cm Anesthesia:    0.33% lidocaine  with 1:200,000 epinephrine  EBL:    <5 mL Complications:  None Repair type:   Coordinated (Chelsea Moreno)  Stages: 3  STAGE I: Anesthesia achieved with 0.5% lidocaine  with 1:200,000 epinephrine . ChloraPrep applied. 1 section(s) excised using Mohs technique (this includes total peripheral and deep tissue margin excision and evaluation with frozen sections, excised and interpreted by the same physician). The tumor was first debulked and then excised with an approx. 2 mm margin.  Hemostasis was achieved with electrocautery as needed.  The specimen was then oriented, subdivided/relaxed, inked, and processed using Mohs technique.    Frozen section analysis revealed a positive margin for well-circumscribed, dermal-based proliferation without epidermal involvement. The lesion is composed of multiple small ductal structures lined by two layers of cuboidal epithelium, occasionally showing decapitation secretion in the deep margin.    STAGE II: An additional 2 mm margin was excised.  Hemostasis was achieved with electrocautery as needed.  The specimen was  then oriented, subdivided/relaxed, inked, and processed using Mohs technique.   Frozen section analysis revealed a positive margin for well-circumscribed, dermal-based proliferation without  epidermal involvement. The lesion is composed of multiple small ductal structures lined by two layers of cuboidal epithelium, occasionally showing decapitation secretion in the deep margin.  STAGE III: An additional 2 mm margin was excised.  Hemostasis was achieved with electrocautery as needed.  The specimen was then oriented, subdivided/relaxed, inked, and processed using Mohs technique. Evaluation of slides by the Mohs surgeon revealed clear tumor margins.   Reconstruction  Patient will be reconstructed by Chelsea Moreno with [facility] oculoplastics on 08/09/2023. Patient was bandaged with vaseline, tefla, gauze and hypafix and instructed to stay in contact with our office with any discomfort between leaving our office and her reconstruction appointment.    Documentation: I have reviewed the above documentation for accuracy and completeness, and I agree with the above.  Chelsea CHRISTELLA HOLY, MD

## 2023-08-07 NOTE — Patient Instructions (Signed)

## 2023-08-08 ENCOUNTER — Encounter: Payer: Self-pay | Admitting: Dermatology

## 2023-08-09 DIAGNOSIS — Z792 Long term (current) use of antibiotics: Secondary | ICD-10-CM | POA: Diagnosis not present

## 2023-08-09 DIAGNOSIS — I10 Essential (primary) hypertension: Secondary | ICD-10-CM | POA: Diagnosis not present

## 2023-08-09 DIAGNOSIS — C44191 Other specified malignant neoplasm of skin of unspecified eyelid, including canthus: Secondary | ICD-10-CM | POA: Diagnosis not present

## 2023-08-09 DIAGNOSIS — Z87891 Personal history of nicotine dependence: Secondary | ICD-10-CM | POA: Diagnosis not present

## 2023-08-09 DIAGNOSIS — C441391 Sebaceous cell carcinoma of skin of left upper eyelid, including canthus: Secondary | ICD-10-CM | POA: Diagnosis not present

## 2023-08-09 DIAGNOSIS — Z634 Disappearance and death of family member: Secondary | ICD-10-CM | POA: Diagnosis not present

## 2023-08-09 DIAGNOSIS — Z79899 Other long term (current) drug therapy: Secondary | ICD-10-CM | POA: Diagnosis not present

## 2023-08-09 DIAGNOSIS — Z7982 Long term (current) use of aspirin: Secondary | ICD-10-CM | POA: Diagnosis not present

## 2023-09-04 ENCOUNTER — Encounter: Payer: Self-pay | Admitting: Internal Medicine

## 2023-09-04 ENCOUNTER — Ambulatory Visit (INDEPENDENT_AMBULATORY_CARE_PROVIDER_SITE_OTHER): Payer: Medicare Other | Admitting: Internal Medicine

## 2023-09-04 VITALS — BP 124/80 | HR 64 | Ht 62.0 in | Wt 181.0 lb

## 2023-09-04 DIAGNOSIS — E232 Diabetes insipidus: Secondary | ICD-10-CM | POA: Diagnosis not present

## 2023-09-04 DIAGNOSIS — R040 Epistaxis: Secondary | ICD-10-CM

## 2023-09-04 LAB — BASIC METABOLIC PANEL WITH GFR
BUN: 9 mg/dL (ref 7–25)
CO2: 30 mmol/L (ref 20–32)
Calcium: 9.6 mg/dL (ref 8.6–10.4)
Chloride: 105 mmol/L (ref 98–110)
Creat: 0.65 mg/dL (ref 0.60–1.00)
Glucose, Bld: 111 mg/dL — ABNORMAL HIGH (ref 65–99)
Potassium: 4.4 mmol/L (ref 3.5–5.3)
Sodium: 143 mmol/L (ref 135–146)
eGFR: 93 mL/min/1.73m2 (ref >=60)

## 2023-09-04 MED ORDER — DESMOPRESSIN ACETATE SPRAY 0.01 % NA SOLN: NASAL | 13 refills | Status: DC

## 2023-09-04 NOTE — Progress Notes (Unsigned)
 Name: Chelsea Moreno  MRN/ DOB: [id number], Oct 14, 1950    Age/ Sex: 73 y.o., female    PCP: Geofm Glade PARAS, MD   Reason for Endocrinology Evaluation: Diabetes Insipidus (DI)     Date of Initial Endocrinology Evaluation: 04/25/2018    HPI: Chelsea Moreno is a 73 y.o. female with a past medical history of DI, HTN and Dyslipidemia. The patient presented for initial endocrinology clinic visit on 04/25/2018 for consultative assistance with her DI .     HISTORICAL SUMMARY: The patient was first diagnosed with DI  at age 56. She presented with polyuria and polydipsia to where she was not able to perform her daily activities or school activities, she was living in [place], [place] at the time, was evaluated at [facility] for ~ 2 weeks and was initially on injections, followed by a powder medicine but around 2010 she was started on Desmopressin  1 spray Q12 hrs.   The cause of DI is unclear but she believes this was attributed to her recurrent falls as a child.   She has been on Desmopressin  1 spray Q12 hrs until March, 2020 when she was noted with hyponatremia ,Na 128 mEq/L, and was asked to reduce the dose to once a day but that patient felt miserable with polydipsia, drinking ~ 2 gallons of fluid at a time and polyuria with the urge to urinate every 45 minutes. Her repeat sodium was 144 mEq/L but the patient is now using desmopressin  ~ every 18 hrs, the last time she used it was ~ 8:30 pm and the time prior to that was around 12:45 AM.   Prior to all this , when she was on Q12 desmopressin  dosing she tends to drink water out of habit.     She has no FH of DI   MRI of the brain showed normal pituitary 09/2021  SUBJECTIVE:    Today (08/30/2021):  Chelsea Moreno is here for a follow up on diabetes insipidus.  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, started methotrexate as well as folic  acid,continues with mouth ulcerations   Patient does follow-up with cardiology for CAD Patient follows with pulmonary for OSA on CPAP  Denies headaches  Had one episode of brief nausea but no vomiting   Has occasional lightheadedness at night with standing  Denies palpitations  Denies constipation or diarrhea  Has been drinking 64-96  oz a day  Nocturia - 0 Has hx of nasal bleed but recently noted  worsening , this has been noted at night while brushing teeth, mainly right-sided, this is also the side that she sprays DDAVP   dDAVP  0.01 % 1 spray daily and a second dose if symptomatic with polyuria and polydipsia  - she tends to  use it every 16 hours    HISTORY:  Past Medical History:  Past Medical History:  Diagnosis Date   Anxiety    Breast cancer (HCC) 11/05/2012   left   Bursitis    Cataract    COLONIC POLYPS, HX OF 09/24/2008   DEPRESSION 09/23/2007   takes Lexapro  daily but hasn't taken since Sept 2014   Diabetes insipidus (HCC)    takes Desmopressin  bid   Diarrhea    Dizziness    when nauseated gets dizzy   Family history of ovarian cancer    Glaucoma    Hemorrhoids  History of bronchitis    yrs ago   History of migraine    last time many yrs ago   Hx of radiation therapy 01/29/13- 03/16/13   left breast 4500 cGy 25 sessions, left breast boost 1000 cGy 5 sessions   HYPERLIPIDEMIA 09/06/2006   takes Atorvasatin nightly   HYPERTENSION 09/06/2006   takes Lisinopril  daily   Muscle spasms of head and/or neck    OSTEOPOROSIS 09/24/2008   takes Actonel  every 30days   Sleep apnea    Vitamin D  deficiency    takes OTC Vit D   Past Surgical History:  Past Surgical History:  Procedure Laterality Date   BREAST BIOPSY Left 21yrs ago   BREAST LUMPECTOMY WITH NEEDLE LOCALIZATION Left 12/16/2012   Procedure: PARTIAL MASTECTOMY WITH NEEDLE LOCALIZATION;  Surgeon: [name], MD;  Location: [facility] OR;  Service: General;  Laterality: Left;  1:00 NL at [facility]    CATARACT  EXTRACTION Bilateral    2024   COLONOSCOPY     eye lid surgery Left     Social History:  reports that she quit smoking about 50 years ago. Her smoking use included cigarettes. She started smoking about 54 years ago. She has a 2 pack-year smoking history. She has never been exposed to tobacco smoke. She has never used smokeless tobacco. She reports that she does not currently use alcohol. She reports that she does not use drugs. Family History: family history includes ALS in her brother; Alcohol abuse in her maternal grandfather; Cancer in her maternal aunt and mother; Heart attack in her father, maternal grandmother, and mother; Heart attack (age of onset: 54) in her brother; Heart attack (age of onset: 30) in her sister; Heart disease in her brother, father, mother, and sister; Hyperlipidemia in her father; Hypertension in her brother, father, and sister; Ovarian cancer in her mother; Parkinson's disease in her sister; Varicose Veins in her mother.   HOME MEDICATIONS: Allergies as of 09/04/2023       Reactions   Brinzolamide-brimonidine Dermatitis, Other (See Comments), Rash        Medication List        Accurate as of September 04, 2023  7:28 AM. If you have any questions, ask your nurse or doctor.          amLODipine  5 MG tablet Commonly known as: NORVASC  Take 1.5 tablets (7.5 mg total) by mouth daily.   aspirin  EC 81 MG tablet Take 1 tablet (81 mg total) by mouth daily. Swallow whole.   atorvastatin  80 MG tablet Commonly known as: LIPITOR Take 1 tablet (80 mg total) by mouth daily.   Caltrate 600+D3 600-20 MG-MCG Tabs Generic drug: Calcium  Carb-Cholecalciferol Take 1 tablet by mouth daily.   carvedilol  6.25 MG tablet Commonly known as: COREG  TAKE 1 TABLET TWICE A DAY   desmopressin  0.01 % solution Commonly known as: DDAVP  NASAL USE 1 SPRAY (10 MCG) IN EACH NOSTRIL TWICE A DAY   dorzolamide-timolol  2-0.5 % ophthalmic solution Commonly known as: COSOPT Place 1 drop  into both eyes 2 (two) times daily.   folic acid 1 MG tablet Commonly known as: FOLVITE Take 1 mg by mouth.   inclisiran 284 MG/1.5ML Sosy injection Commonly known as: LEQVIO  Inject 284 mg into the skin every 6 (six) months.   lisinopril  20 MG tablet Commonly known as: ZESTRIL  Take 20 mg by mouth daily.   methotrexate 2.5 MG tablet Commonly known as: RHEUMATREX Take 15 mg by mouth once a week.   oxyCODONE  5  MG immediate release tablet Commonly known as: Oxy IR/ROXICODONE  Take 1 tablet (5 mg total) by mouth every 6 (six) hours as needed for up to 10 doses.   Vitamin D3 1000 units Caps Take 3 capsules by mouth daily.   Vyzulta  0.024 % Soln Generic drug: Latanoprostene Bunod  INSTILL 1 DROP INTO EACH EYE ONCE DAILY AT BEDTIME   ZOFRAN  PO Take by mouth.            DATA REVIEWED:   Latest Reference Range & Units 09/04/23 12:11  Sodium 135 - 146 mmol/L 143  Potassium 3.5 - 5.3 mmol/L 4.4  Chloride 98 - 110 mmol/L 105  CO2 20 - 32 mmol/L 30  Glucose 65 - 99 mg/dL 888 (H)  BUN 7 - 25 mg/dL 9  Creatinine 9.39 - 8.99 mg/dL 9.34  Calcium  8.6 - 10.4 mg/dL 9.6  BUN/Creatinine Ratio 6 - 22 (calc) SEE NOTE:  eGFR > OR = 60 mL/min/1.25m2 93  (H): Data is abnormally high   Latest Reference Range & Units 04/03/23 13:41  Sodium 135 - 145 mEq/L 139  Potassium 3.5 - 5.1 mEq/L 4.6  Chloride 96 - 112 mEq/L 102  CO2 19 - 32 mEq/L 29  Glucose 70 - 99 mg/dL 881 (H)  BUN 6 - 23 mg/dL 9  Creatinine 9.59 - 8.79 mg/dL 9.33  Calcium  8.4 - 10.5 mg/dL 9.7  Alkaline Phosphatase 39 - 117 U/L 93  Albumin 3.5 - 5.2 g/dL 4.7  AST 0 - 37 U/L 22  ALT 0 - 35 U/L 19  Total Protein 6.0 - 8.3 g/dL 7.4  Total Bilirubin 0.2 - 1.2 mg/dL 0.8  GFR >39.99 mL/min 87.67    MRI brain 09/15/2021  FINDINGS: Sella: Infundibulum is normal in caliber and midline. There is no sellar or suprasellar mass.   Brain: There is no acute infarction or intracranial hemorrhage. There is no intracranial mass,  mass effect, or edema. There is no hydrocephalus or extra-axial fluid collection. Ventricles and sulci are normal in size and configuration. Patchy and confluent areas of T2 hyperintensity in the supratentorial in pontine white matter are nonspecific but may reflect mild to moderate chronic microvascular ischemic changes. No abnormal enhancement.   Vascular: Major vessel flow voids at the skull base are preserved.   Skull and upper cervical spine: Normal marrow signal is preserved.   Sinuses/Orbits: Paranasal sinuses are aerated. Orbits are unremarkable.   Other: Mastoid air cells are clear.   IMPRESSION: No sellar or suprasellar mass.   Mild to moderate chronic microvascular ischemic changes.   Old records , labs and images have been reviewed.    ASSESSMENT/PLAN/RECOMMENDATIONS:   Diabetes Insipidus:   - Pt  advised again to drink to thirst ONLY , to avoid hyponatremia -She understands the consequences of hyponatremia including seizures and coma etc, we discussed promptly getting a medical evaluation for nausea/ vomiting  - BMP shows normal sodium and GFR -No changes at this time, a refill was sent to the pharmacy   Medications : Continue dDAVP  0.01 % 1 spray daily, and a second dose if symptomatic  2.  Epistaxis:  -This is milling from the right nostril which is where she sprays dDAVP  - I did advise the patient to avoid spraying the medication in the right nostril for the next 2 weeks, if epistaxis continues she will need to follow-up with the ENT for cauterization - Patient also advised that in the future she will need to alternate spraying DDAVP   F/u in 1 yr  I spent 25 minutes preparing to see the patient by review of recent labs, imaging and procedures, obtaining and reviewing separately obtained history, communicating with the patient, ordering medications, tests or procedures, and documenting clinical information in the EHR including the differential Dx,  treatment, and any further evaluation and other management    Signed electronically by: Stefano Redgie Butts, MD  [facility] Endocrinology  [facility] [address] [place] [place], [place] [address] Phone: [phone] FAX: [phone]   CC: Geofm Glade PARAS, MD [address] [place] [address] Phone: [phone] Fax: [phone]   Return to Endocrinology clinic as below: Future Appointments  Date Time Provider Department Center  09/04/2023 11:30 AM Maksim Peregoy, Donell Redgie, MD [facility] None  10/04/2023  1:00 PM Geofm Glade PARAS, MD [facility]  12/11/2023  2:00 PM [facility] 2 [facility] None  12/27/2023 11:00 AM Crawford Morna Pickle, NP [facility] None  04/08/2024  3:50 PM [facility]-ANNUAL WELLNESS VISIT [facility]

## 2023-09-05 ENCOUNTER — Ambulatory Visit: Payer: Self-pay | Admitting: Internal Medicine

## 2023-09-05 DIAGNOSIS — Z79899 Other long term (current) drug therapy: Secondary | ICD-10-CM | POA: Diagnosis not present

## 2023-09-05 DIAGNOSIS — R040 Epistaxis: Secondary | ICD-10-CM | POA: Insufficient documentation

## 2023-09-10 ENCOUNTER — Telehealth: Payer: Self-pay

## 2023-09-10 MED ORDER — DESMOPRESSIN ACETATE SPRAY 0.01 % NA SOLN: NASAL | 13 refills | Status: AC

## 2023-09-10 NOTE — Telephone Encounter (Signed)
 [facility] sent fax to clarify directions on the Desmopressin .  They state typically it's used in one nostril.

## 2023-09-30 ENCOUNTER — Other Ambulatory Visit: Payer: Self-pay | Admitting: Internal Medicine

## 2023-10-01 DIAGNOSIS — H02824 Cysts of left upper eyelid: Secondary | ICD-10-CM | POA: Diagnosis not present

## 2023-10-01 DIAGNOSIS — C44191 Other specified malignant neoplasm of skin of unspecified eyelid, including canthus: Secondary | ICD-10-CM | POA: Diagnosis not present

## 2023-10-03 ENCOUNTER — Encounter: Payer: Self-pay | Admitting: Internal Medicine

## 2023-10-03 NOTE — Progress Notes (Unsigned)
 Subjective:    Patient ID: Chelsea Moreno, female    DOB: Aug 12, 1950, 73 y.o.   MRN: [id number]     HPI Chelsea Moreno is here for follow up of her chronic medical problems.  Eye surgery - left eye in May - had a cyst along eye lash line -- had biopsy and it showed cancer of the glands of the eye.  Cysts removed in May.  Had second surgery - end of July - Moh's surgery - the reconstruction in August.    Not exercising.   She has excessive sinus drainage - constant.    Right side - lower lateral ribs to axilla spasm   - soreness after.  No activity causes it.  Lasts few minutes.  Has occurred a few times - no pattern to when it occurs.    Medications and allergies reviewed with patient and updated if appropriate.  Current Outpatient Medications on File Prior to Visit  Medication Sig Dispense Refill   amLODipine  (NORVASC ) 5 MG tablet Take 1.5 tablets (7.5 mg total) by mouth daily. 90 tablet 3   aspirin  EC 81 MG tablet Take 1 tablet (81 mg total) by mouth daily. Swallow whole. 30 tablet 12   atorvastatin  (LIPITOR) 80 MG tablet TAKE 1 TABLET DAILY 90 tablet 3   Calcium  Carb-Cholecalciferol (CALTRATE 600+D3) 600-20 MG-MCG TABS Take 1 tablet by mouth daily.     carvedilol  (COREG ) 6.25 MG tablet TAKE 1 TABLET TWICE A DAY 180 tablet 3   Cholecalciferol (VITAMIN D3) 25 MCG (1000 UT) capsule Take 3 capsules by mouth daily.     desmopressin  (DDAVP  NASAL) 0.01 % solution USE 1 SPRAY (10 MCG) IN ONE NOSTRIL TWICE A DAY 10 mL 13   dorzolamide-timolol  (COSOPT) 2-0.5 % ophthalmic solution Place 1 drop into both eyes 2 (two) times daily.     folic acid (FOLVITE) 1 MG tablet Take 1 mg by mouth.     inclisiran (LEQVIO ) 284 MG/1.5ML SOSY injection Inject 284 mg into the skin every 6 (six) months.     lisinopril  (ZESTRIL ) 20 MG tablet Take 20 mg by mouth daily.     methotrexate (RHEUMATREX) 2.5 MG tablet Take 15 mg by mouth once a week.     Ondansetron  HCl (ZOFRAN  PO) Take by mouth.     VYZULTA  0.024 %  SOLN INSTILL 1 DROP INTO EACH EYE ONCE DAILY AT BEDTIME     No current facility-administered medications on file prior to visit.     Review of Systems  Constitutional:  Negative for fever.  HENT:  Positive for postnasal drip.   Respiratory:  Negative for cough, shortness of breath and wheezing.   Cardiovascular:  Positive for leg swelling (medicatoin related). Negative for chest pain and palpitations.  Neurological:  Negative for dizziness, light-headedness (occ when first stands) and headaches.  Psychiatric/Behavioral:  Positive for sleep disturbance (difficultly falling asleep).        Objective:   Vitals:   10/04/23 1303  BP: 116/72  Pulse: 65  Temp: 98.3 F (36.8 C)  SpO2: 96%   BP Readings from Last 3 Encounters:  10/04/23 116/72  09/04/23 124/80  08/07/23 118/78   Wt Readings from Last 3 Encounters:  10/04/23 184 lb (83.5 kg)  09/04/23 181 lb (82.1 kg)  07/05/23 190 lb (86.2 kg)   Body mass index is 33.65 kg/m.    Physical Exam Constitutional:      General: She is not in acute distress.    Appearance:  Normal appearance.  HENT:     Head: Normocephalic and atraumatic.  Eyes:     Conjunctiva/sclera: Conjunctivae normal.  Cardiovascular:     Rate and Rhythm: Normal rate and regular rhythm.     Heart sounds: Normal heart sounds.  Pulmonary:     Effort: Pulmonary effort is normal. No respiratory distress.     Breath sounds: Normal breath sounds. No wheezing.  Musculoskeletal:     Cervical back: Neck supple.     Right lower leg: No edema.     Left lower leg: No edema.  Lymphadenopathy:     Cervical: No cervical adenopathy.  Skin:    General: Skin is warm and dry.     Findings: No rash.  Neurological:     Mental Status: She is alert. Mental status is at baseline.  Psychiatric:        Mood and Affect: Mood normal.        Behavior: Behavior normal.        Lab Results  Component Value Date   WBC 4.7 04/03/2023   HGB 13.2 04/03/2023   HCT 38.0  04/03/2023   PLT 303.0 04/03/2023   GLUCOSE 111 (H) 09/04/2023   CHOL 130 04/03/2023   TRIG 121.0 04/03/2023   HDL 46.00 04/03/2023   LDLDIRECT 171.8 03/21/2011   LDLCALC 60 04/03/2023   ALT 19 04/03/2023   AST 22 04/03/2023   NA 143 09/04/2023   K 4.4 09/04/2023   CL 105 09/04/2023   CREATININE 0.65 09/04/2023   BUN 9 09/04/2023   CO2 30 09/04/2023   TSH 1.00 04/03/2023   HGBA1C 5.7 04/03/2023     Assessment & Plan:    See Problem List for Assessment and Plan of chronic medical problems.

## 2023-10-03 NOTE — Patient Instructions (Addendum)
     Flu immunization administered today.       Medications changes include :   None     Return in about 6 months (around 04/02/2024) for follow up.

## 2023-10-04 ENCOUNTER — Ambulatory Visit (INDEPENDENT_AMBULATORY_CARE_PROVIDER_SITE_OTHER): Admitting: Internal Medicine

## 2023-10-04 VITALS — BP 116/72 | HR 65 | Temp 98.3°F | Ht 62.0 in | Wt 184.0 lb

## 2023-10-04 DIAGNOSIS — E232 Diabetes insipidus: Secondary | ICD-10-CM | POA: Diagnosis not present

## 2023-10-04 DIAGNOSIS — M62838 Other muscle spasm: Secondary | ICD-10-CM

## 2023-10-04 DIAGNOSIS — M8588 Other specified disorders of bone density and structure, other site: Secondary | ICD-10-CM | POA: Diagnosis not present

## 2023-10-04 DIAGNOSIS — I10 Essential (primary) hypertension: Secondary | ICD-10-CM

## 2023-10-04 DIAGNOSIS — Z23 Encounter for immunization: Secondary | ICD-10-CM | POA: Diagnosis not present

## 2023-10-04 DIAGNOSIS — E785 Hyperlipidemia, unspecified: Secondary | ICD-10-CM

## 2023-10-04 DIAGNOSIS — G4733 Obstructive sleep apnea (adult) (pediatric): Secondary | ICD-10-CM

## 2023-10-04 DIAGNOSIS — I2510 Atherosclerotic heart disease of native coronary artery without angina pectoris: Secondary | ICD-10-CM | POA: Diagnosis not present

## 2023-10-04 DIAGNOSIS — L439 Lichen planus, unspecified: Secondary | ICD-10-CM

## 2023-10-04 DIAGNOSIS — E559 Vitamin D deficiency, unspecified: Secondary | ICD-10-CM | POA: Diagnosis not present

## 2023-10-04 DIAGNOSIS — R7303 Prediabetes: Secondary | ICD-10-CM

## 2023-10-04 NOTE — Assessment & Plan Note (Addendum)
 Chronic Check lipids, CMP Regular exercise, healthy diet encouraged Continue atorvastatin  80 mg daily, also on Leqvio 

## 2023-10-04 NOTE — Assessment & Plan Note (Signed)
 Chronic Blood pressure well controlled Continue lisinopril  20 mg twice daily, amlodipine  7.5 mg daily, coreg  6.25 mg bid CMP

## 2023-10-04 NOTE — Assessment & Plan Note (Signed)
 Chronic Management per Endo On desmopressin once a day - symptoms controlled

## 2023-10-04 NOTE — Assessment & Plan Note (Signed)
 Chronic Following with cardiology Continue aspirin  81 mg daily, atorvastatin  80 mg daily, Levqio Q 6 months BP well-controlled, sugar controlled Stressed healthy diet and regular exercise Encouraged weight loss CMP, lipid

## 2023-10-04 NOTE — Assessment & Plan Note (Addendum)
 Chronic Moderate in severity Using cpap nightly

## 2023-10-04 NOTE — Assessment & Plan Note (Signed)
 Chronic Taking vitamin D daily Check vitamin D level

## 2023-10-04 NOTE — Assessment & Plan Note (Addendum)
 Chronic Improved Following with dermatology at [facility] was taking baricitinib, which significantly helped her symptoms, but insurance will not pay for it Taking methotrexate which is not helping much Currently has mild-moderate symptoms

## 2023-10-04 NOTE — Assessment & Plan Note (Signed)
 Chronic DEXA up-to-date Stressed regular exercise Continue supplemental calcium and vitamin D Check vitamin D level

## 2023-10-04 NOTE — Assessment & Plan Note (Signed)
 Acute Right side - lower rib cage to axilla No pattern to when or why it happens Advised heat, stretching If persists can refer to sports med

## 2023-10-04 NOTE — Assessment & Plan Note (Signed)
 Chronic Lab Results  Component Value Date   HGBA1C 5.7 04/03/2023   Check A1c Low sugar / carb diet Stressed regular exercise

## 2023-10-11 DIAGNOSIS — H409 Unspecified glaucoma: Secondary | ICD-10-CM | POA: Diagnosis not present

## 2023-10-11 DIAGNOSIS — C441991 Other specified malignant neoplasm of skin of left upper eyelid, including canthus: Secondary | ICD-10-CM | POA: Diagnosis not present

## 2023-10-11 DIAGNOSIS — C441992 Other specified malignant neoplasm of skin of left lower eyelid, including canthus: Secondary | ICD-10-CM | POA: Diagnosis not present

## 2023-10-11 DIAGNOSIS — H269 Unspecified cataract: Secondary | ICD-10-CM | POA: Diagnosis not present

## 2023-10-11 DIAGNOSIS — E66811 Obesity, class 1: Secondary | ICD-10-CM | POA: Diagnosis not present

## 2023-10-11 DIAGNOSIS — C44191 Other specified malignant neoplasm of skin of unspecified eyelid, including canthus: Secondary | ICD-10-CM | POA: Diagnosis not present

## 2023-10-11 DIAGNOSIS — I10 Essential (primary) hypertension: Secondary | ICD-10-CM | POA: Diagnosis not present

## 2023-10-11 DIAGNOSIS — Z7982 Long term (current) use of aspirin: Secondary | ICD-10-CM | POA: Diagnosis not present

## 2023-10-11 DIAGNOSIS — Z79899 Other long term (current) drug therapy: Secondary | ICD-10-CM | POA: Diagnosis not present

## 2023-10-11 DIAGNOSIS — Z87891 Personal history of nicotine dependence: Secondary | ICD-10-CM | POA: Diagnosis not present

## 2023-10-24 DIAGNOSIS — L438 Other lichen planus: Secondary | ICD-10-CM | POA: Diagnosis not present

## 2023-10-24 DIAGNOSIS — Z79899 Other long term (current) drug therapy: Secondary | ICD-10-CM | POA: Diagnosis not present

## 2023-10-29 ENCOUNTER — Other Ambulatory Visit: Payer: Self-pay | Admitting: Cardiology

## 2023-11-19 DIAGNOSIS — Z1231 Encounter for screening mammogram for malignant neoplasm of breast: Secondary | ICD-10-CM | POA: Diagnosis not present

## 2023-11-19 LAB — HM MAMMOGRAPHY

## 2023-11-20 ENCOUNTER — Encounter: Payer: Self-pay | Admitting: Internal Medicine

## 2023-11-20 ENCOUNTER — Ambulatory Visit: Payer: Self-pay | Admitting: Gastroenterology

## 2023-12-11 ENCOUNTER — Ambulatory Visit (INDEPENDENT_AMBULATORY_CARE_PROVIDER_SITE_OTHER)

## 2023-12-11 VITALS — BP 142/70 | HR 60 | Temp 97.8°F | Resp 18 | Ht 62.0 in | Wt 189.2 lb

## 2023-12-11 DIAGNOSIS — I10 Essential (primary) hypertension: Secondary | ICD-10-CM

## 2023-12-11 DIAGNOSIS — E785 Hyperlipidemia, unspecified: Secondary | ICD-10-CM

## 2023-12-11 DIAGNOSIS — R931 Abnormal findings on diagnostic imaging of heart and coronary circulation: Secondary | ICD-10-CM

## 2023-12-11 DIAGNOSIS — Z79899 Other long term (current) drug therapy: Secondary | ICD-10-CM | POA: Diagnosis not present

## 2023-12-11 MED ORDER — INCLISIRAN SODIUM 284 MG/1.5ML ~~LOC~~ SOSY
284.0000 mg | PREFILLED_SYRINGE | Freq: Once | SUBCUTANEOUS | Status: AC
  Administered 2023-12-11: 284 mg via SUBCUTANEOUS
  Filled 2023-12-11: qty 1.5

## 2023-12-11 NOTE — Progress Notes (Signed)
 Diagnosis: Hyperlipidemia  Provider:  Chilton Greathouse MD  Procedure: Injection  Leqvio (inclisiran), Dose: 284 mg, Site: subcutaneous, Number of injections: 1  Injection Site(s): Right arm  Post Care: Patient declined observation  Discharge: Condition: Good, Destination: Home . AVS Declined  Performed by:  Wyvonne Lenz, RN

## 2023-12-23 DIAGNOSIS — H401132 Primary open-angle glaucoma, bilateral, moderate stage: Secondary | ICD-10-CM | POA: Diagnosis not present

## 2023-12-27 ENCOUNTER — Other Ambulatory Visit: Payer: Self-pay | Admitting: Cardiology

## 2023-12-27 ENCOUNTER — Inpatient Hospital Stay: Payer: Medicare Other | Attending: Adult Health | Admitting: Adult Health

## 2023-12-27 VITALS — BP 133/51 | HR 60 | Temp 98.1°F | Resp 17 | Wt 190.1 lb

## 2023-12-27 DIAGNOSIS — Z87891 Personal history of nicotine dependence: Secondary | ICD-10-CM | POA: Diagnosis not present

## 2023-12-27 DIAGNOSIS — Z170 Estrogen receptor positive status [ER+]: Secondary | ICD-10-CM

## 2023-12-27 DIAGNOSIS — M810 Age-related osteoporosis without current pathological fracture: Secondary | ICD-10-CM | POA: Insufficient documentation

## 2023-12-27 DIAGNOSIS — Z923 Personal history of irradiation: Secondary | ICD-10-CM | POA: Insufficient documentation

## 2023-12-27 DIAGNOSIS — C50312 Malignant neoplasm of lower-inner quadrant of left female breast: Secondary | ICD-10-CM

## 2023-12-27 DIAGNOSIS — Z853 Personal history of malignant neoplasm of breast: Secondary | ICD-10-CM | POA: Diagnosis present

## 2023-12-27 NOTE — Progress Notes (Signed)
 Chelsea Moreno:    Chelsea Glade PARAS, [name]   DIAGNOSIS: Cancer Staging  Cancer of lower-inner quadrant of left female breast [facility]) Staging form: Breast, AJCC 7th Edition - Clinical: Stage 0 (Tis, N0, cM0) - Unsigned Specimen type: Core Needle Biopsy Histopathologic type: 9932 Laterality: Left Staging comments: Staged at breast conference 11.5.14  - Pathologic: No stage assigned - Unsigned Specimen type: Core Needle Biopsy Histopathologic type: 9932 Laterality: Left    SUMMARY OF ONCOLOGIC HISTORY: Oncology History  Cancer of lower-inner quadrant of left female breast (HCC)  11/05/2012 Initial Diagnosis   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  The Hereditary Gene Panel offered by [facility] includes sequencing and/or deletion duplication testing of the following 47 genes: APC, ATM, AXIN2, BARD1, BMPR1A, BRCA1, BRCA2, BRIP1, CDH1, CDK4, CDKN2A (p14ARF), CDKN2A (p16INK4a), CHEK2, CTNNA1, DICER1, EPCAM (Deletion/duplication testing only), GREM1 (promoter region deletion/duplication testing only), KIT, MEN1, MLH1, MSH2, MSH3, MSH6, MUTYH, NBN, NF1, NHTL1, PALB2, PDGFRA, PMS2, POLD1, POLE, PTEN, RAD50, RAD51C, RAD51D, SDHB, SDHC, SDHD, SMAD4, SMARCA4. STK11, TP53, TSC1, TSC2, and VHL.  The following genes were evaluated for sequence changes only: SDHA and HOXB13 c.251G>A variant only. The report date is February 19, 2017.     CURRENT THERAPY:  INTERVAL HISTORY:  Discussed the use of AI scribe software for clinical note transcription with the patient, who gave verbal  consent to proceed.  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  She was diagnosed with left breast ductal carcinoma in situ in 2014 and underwent left lumpectomy, adjuvant radiation, and five years of tamoxifen . She remains on surveillance. Her most recent mammogram on November 19, 2023, showed no evidence of malignancy with breast density category B. She denies breast pain, palpable masses, nipple discharge, or skin changes.  Since her last oncology visit in July 2025, she underwent three left eye surgeries for excision of mucin-producing sweat gland carcinoma of the eyelid, including lesion excision, nasal surgery for tumor removal, and reconstructive surgery with skin grafting and temporary eyelid closure. After eyelid separation she has persistent periorbital swelling, incomplete eyelid opening, decreased visual acuity, and end-of-day eye fatigue and discomfort. She reports complete excision of the carcinoma and ongoing healing.  She reports disruption of her exercise routine during recovery from eye surgeries, with weight gain, decreased energy, low motivation, and guilt about inactivity, which she feels are worse during the winter months.  She recently had a productive cough and bronchial congestion that has mostly resolved and is now only mildly bothersome. She reports no other acute symptoms.     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   Right knee pain 04/04/2022   Altered taste 08/08/2020   High  coronary artery calcium  score, 1202 03/28/2020   Dizziness 03/25/2019   Prediabetes  03/24/2018   Genetic testing 02/26/2017   Family history of ovarian cancer    OSA (obstructive sleep apnea) 09/08/2014   Varicose veins of leg with swelling 08/13/2014   Vomiting and diarrhea, episodic 07/15/2014   Chronic venous insufficiency 06/02/2014   Cancer of lower-inner quadrant of left female breast (HCC) 11/07/2012   Lichen planus 08/19/2012   Diabetes insipidus 03/28/2011   Osteopenia 09/24/2008   Depression 09/23/2007   Dyslipidemia 09/06/2006   Essential hypertension 09/06/2006    is allergic to brinzolamide-brimonidine.  MEDICAL HISTORY: Past Medical History:  Diagnosis Date   Anxiety    Breast cancer (HCC) 11/05/2012   left   Bursitis    Cataract    COLONIC POLYPS, HX OF 09/24/2008   DEPRESSION 09/23/2007   takes Lexapro  daily but hasn't taken since Sept 2014   Diabetes insipidus    takes Desmopressin  bid   Diarrhea    Dizziness    when nauseated gets dizzy   Family history of ovarian cancer    Glaucoma    Hemorrhoids    History of bronchitis    yrs ago   History of migraine    last time many yrs ago   Hx of radiation therapy 01/29/13- 03/16/13   left breast 4500 cGy 25 sessions, left breast boost 1000 cGy 5 sessions   HYPERLIPIDEMIA 09/06/2006   takes Atorvasatin nightly   HYPERTENSION 09/06/2006   takes Lisinopril  daily   Muscle spasms of head and/or neck    OSTEOPOROSIS 09/24/2008   takes Actonel  every 30days   Sleep apnea    Vitamin D  deficiency    takes OTC Vit D    SURGICAL HISTORY: Past Surgical History:  Procedure Laterality Date   BREAST BIOPSY Left 41yrs ago   BREAST LUMPECTOMY WITH NEEDLE LOCALIZATION Left 12/16/2012   Procedure: PARTIAL MASTECTOMY WITH NEEDLE LOCALIZATION;  Surgeon: Chelsea Nephew, MD;  Location: [facility] OR;  Service: General;  Laterality: Left;  1:00 NL at [facility]    CATARACT EXTRACTION Bilateral    2024   COLONOSCOPY     eye lid surgery Left     SOCIAL HISTORY: Social History   Socioeconomic History   Marital  status: Widowed    Spouse name: Not on file   Number of children: Not on file   Years of education: Not on file   Highest education level: Doctorate  Occupational History   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  Tobacco Use   Smoking status: Former    Current packs/day: 0.00    Average packs/day: 0.5 packs/day for 4.0 years (2.0 ttl pk-yrs)    Types: Cigarettes    Start date: 01/07/1969    Quit date: 01/07/1973    Years since quitting: 51.0    Passive exposure: Never   Smokeless tobacco: Never   Tobacco comments:    quit 41yrs ago  Vaping Use   Vaping status: Never Used  Substance and Sexual Activity   Alcohol use: Not Currently   Drug use: No   Sexual activity: Not Currently    Birth control/protection: Post-menopausal    Comment: menarche age 42, first live birth age 29, menopause 2002, no HRT  Other Topics Concern   Not on file  Social History Narrative   Lives alone    Social Drivers of Health   Tobacco Use: Medium Risk (11/12/2023)   [facility] from [facility]   Patient History  Smoking Tobacco Use: Former    Smokeless Tobacco Use: Never    Passive Exposure: Not on file  Financial Resource Strain: Low Risk (09/30/2023)   Overall Financial Resource Strain (CARDIA)    Difficulty of Paying Living Expenses: Not very hard  Food Insecurity: No Food Insecurity (09/30/2023)   Epic    Worried About Programme Researcher, Broadcasting/film/video in the Last Year: Never true    Ran Out of Food in the Last Year: Never true  Transportation Needs: No Transportation Needs (09/30/2023)   Epic    Lack of Transportation (Medical): No    Lack of Transportation (Non-Medical): No  Physical Activity: Inactive (09/30/2023)   Exercise Vital Sign    Days of Exercise per Week: 0 days    Minutes of Exercise per Session: Not on file  Stress: Stress Concern Present (09/30/2023)   [employer] of Occupational Health - Occupational Stress Questionnaire    Feeling of Stress: Rather much  Social  Connections: Moderately Isolated (09/30/2023)   Social Connection and Isolation Panel    Frequency of Communication with Friends and Family: Twice a week    Frequency of Social Gatherings with Friends and Family: Once a week    Attends Religious Services: Never    Database Administrator or Organizations: Yes    Attends Engineer, Structural: More than 4 times per year    Marital Status: Widowed  Intimate Partner Violence: Patient Unable To Answer (04/05/2023)   Humiliation, Afraid, Rape, and Kick questionnaire    Fear of Current or Ex-Partner: Patient unable to answer    Emotionally Abused: Patient unable to answer    Physically Abused: Patient unable to answer    Sexually Abused: Patient unable to answer  Depression (PHQ2-9): Low Risk (04/05/2023)   Depression (PHQ2-9)    PHQ-2 Score: 3  Alcohol Screen: Low Risk (04/04/2022)   Alcohol Screen    Last Alcohol Screening Score (AUDIT): 0  Housing: Low Risk (09/30/2023)   Epic    Unable to Pay for Housing in the Last Year: No    Number of Times Moved in the Last Year: 0    Homeless in the Last Year: No  Utilities: Not At Risk (04/05/2023)   AHC Utilities    Threatened with loss of utilities: No  Health Literacy: Adequate Health Literacy (04/05/2023)   B1300 Health Literacy    Frequency of need for help with medical instructions: Never    FAMILY HISTORY: Family History  Problem Relation Age of Onset   Ovarian cancer Mother    Cancer Mother        Ovarian   Heart disease Mother        Before age 72   Heart attack Mother    Varicose Veins Mother    Heart disease Father        Before age 81   Hyperlipidemia Father    Hypertension Father    Heart attack Father    Heart disease Sister        Before age 92   Hypertension Sister    Heart attack Sister 38       second heart attack at 64   Parkinson's disease Sister    Heart disease Brother        Before age 33   Hypertension Brother    Heart attack Brother 25   ALS  Brother    Cancer Maternal Aunt        GYN cancer   Heart attack  Maternal Grandmother    Alcohol abuse Maternal Grandfather    Colon cancer Neg Hx    Esophageal cancer Neg Hx    Stomach cancer Neg Hx    Rectal cancer Neg Hx     Review of Systems  Constitutional:  Negative for appetite change, chills, fatigue, fever and unexpected weight change.  HENT:   Negative for hearing loss, lump/mass and trouble swallowing.   Eyes:  Negative for eye problems and icterus.  Respiratory:  Negative for chest tightness, cough and shortness of breath.   Cardiovascular:  Negative for chest pain, leg swelling and palpitations.  Gastrointestinal:  Negative for abdominal distention, abdominal pain, constipation, diarrhea, nausea and vomiting.  Endocrine: Negative for hot flashes.  Genitourinary:  Negative for difficulty urinating.   Musculoskeletal:  Negative for arthralgias.  Skin:  Negative for itching and rash.  Neurological:  Negative for dizziness, extremity weakness, headaches and numbness.  Hematological:  Negative for adenopathy. Does not bruise/bleed easily.  Psychiatric/Behavioral:  Negative for depression. The patient is not nervous/anxious.       PHYSICAL EXAMINATION    Vitals:   12/27/23 1130  BP: (!) 133/51  Pulse: 60  Resp: 17  Temp: 98.1 F (36.7 C)  SpO2: 96%    Physical Exam Constitutional:      General: She is not in acute distress.    Appearance: Normal appearance. She is not toxic-appearing.  HENT:     Head: Normocephalic and atraumatic.     Mouth/Throat:     Mouth: Mucous membranes are moist.     Pharynx: Oropharynx is clear. No oropharyngeal exudate or posterior oropharyngeal erythema.  Eyes:     General: No scleral icterus. Cardiovascular:     Rate and Rhythm: Normal rate and regular rhythm.     Pulses: Normal pulses.     Heart sounds: Normal heart sounds.  Pulmonary:     Effort: Pulmonary effort is normal.     Breath sounds: Normal breath sounds.   Chest:     Comments: Left breast s/p lumpectomy and radiation, no sign of local recurrence; right breast benign Abdominal:     General: Abdomen is flat. Bowel sounds are normal. There is no distension.     Palpations: Abdomen is soft.     Tenderness: There is no abdominal tenderness.  Musculoskeletal:        General: No swelling.     Cervical back: Neck supple.  Lymphadenopathy:     Cervical: No cervical adenopathy.     Upper Body:     Right upper body: No supraclavicular or axillary adenopathy.     Left upper body: No supraclavicular or axillary adenopathy.  Skin:    General: Skin is warm and dry.     Findings: No rash.  Neurological:     General: No focal deficit present.     Mental Status: She is alert.  Psychiatric:        Mood and Affect: Mood normal.        Behavior: Behavior normal.        ASSESSMENT and THERAPY PLAN:    Assessment and Plan Assessment & Plan Left breast cancer in remission Remains in remission post-lumpectomy, adjuvant radiation, and tamoxifen  for DCIS. Asymptomatic with no recurrence on recent imaging. On observation alone - Reviewed November 19, 2023 mammogram, no malignancy. - Continue annual surveillance, follow-Moreno in one year. - Advised to report new symptoms or concerns. - Discussed healthy diet and exercise - Recommended continued f/u with PCP for  health maintenance and general health care needs  All questions were answered. The patient knows to call the clinic with any problems, questions or concerns. We can certainly see the patient much sooner if necessary.  Total encounter time:20 minutes*in face-to-face visit time, chart review, lab review, care coordination, order entry, and documentation of the encounter time.    Morna Kendall, NP 12/27/2023 11:39 AM Medical Oncology and Hematology [facility] [address] [place], [place] [address] Tel. [phone]    Fax. [phone]  *Total Encounter Time as defined by  the Centers for Medicare and Medicaid Services includes, in addition to the face-to-face time of a patient visit (documented in the note above) non-face-to-face time: obtaining and reviewing outside history, ordering and reviewing medications, tests or procedures, care coordination (communications with other health care professionals or caregivers) and documentation in the medical record.

## 2024-01-03 ENCOUNTER — Encounter: Payer: Self-pay | Admitting: Cardiology

## 2024-01-06 ENCOUNTER — Other Ambulatory Visit: Payer: Self-pay | Admitting: Cardiology

## 2024-01-14 ENCOUNTER — Telehealth: Payer: Self-pay

## 2024-01-14 NOTE — Telephone Encounter (Signed)
 Auth Submission: NO AUTH NEEDED Site of care: Site of care: [facility] Payer: Medicare A/B with Tricare for life Medication & CPT/J Code(s) submitted: Leqvio  (Inclisiran) J1306 Diagnosis Code:  Route of submission (phone, fax, portal):  Phone # Fax # Auth type: Buy/Bill PB Units/visits requested: 284mg  x 2 doses Reference number:  Approval from: 01/14/24 to 02/07/25

## 2024-04-03 ENCOUNTER — Ambulatory Visit: Admitting: Internal Medicine

## 2024-04-08 ENCOUNTER — Ambulatory Visit

## 2024-06-11 ENCOUNTER — Ambulatory Visit

## 2024-09-03 ENCOUNTER — Ambulatory Visit: Admitting: Internal Medicine

## 2024-12-28 ENCOUNTER — Inpatient Hospital Stay: Admitting: Adult Health
